# Patient Record
Sex: Female | Born: 1945 | Race: White | Hispanic: No | Marital: Married | Smoking: Former smoker
Health system: Southern US, Community
[De-identification: ages and names within clinical notes are randomized; demographics above are authoritative.]

## PROBLEM LIST (undated history)

## (undated) DIAGNOSIS — E785 Hyperlipidemia, unspecified: Secondary | ICD-10-CM

## (undated) DIAGNOSIS — M1990 Unspecified osteoarthritis, unspecified site: Secondary | ICD-10-CM

## (undated) DIAGNOSIS — I10 Essential (primary) hypertension: Secondary | ICD-10-CM

## (undated) DIAGNOSIS — F329 Major depressive disorder, single episode, unspecified: Secondary | ICD-10-CM

## (undated) DIAGNOSIS — F32A Depression, unspecified: Secondary | ICD-10-CM

## (undated) DIAGNOSIS — Z87442 Personal history of urinary calculi: Secondary | ICD-10-CM

## (undated) DIAGNOSIS — C50919 Malignant neoplasm of unspecified site of unspecified female breast: Secondary | ICD-10-CM

## (undated) HISTORY — PX: OTHER SURGICAL HISTORY: SHX169

## (undated) HISTORY — PX: ECTOPIC PREGNANCY SURGERY: SHX613

## (undated) HISTORY — PX: BREAST LUMPECTOMY: SHX2

## (undated) HISTORY — PX: INNER EAR SURGERY: SHX679

## (undated) HISTORY — PX: TONSILLECTOMY: SUR1361

## (undated) HISTORY — PX: BREAST ENHANCEMENT SURGERY: SHX7

## (undated) HISTORY — PX: TUBAL LIGATION: SHX77

## (undated) HISTORY — DX: Malignant neoplasm of unspecified site of unspecified female breast: C50.919

## (undated) HISTORY — PX: CATARACT EXTRACTION, BILATERAL: SHX1313

## (undated) HISTORY — PX: EYE SURGERY: SHX253

---

## 2002-05-09 ENCOUNTER — Encounter: Admission: RE | Admit: 2002-05-09 | Discharge: 2002-05-09 | Payer: Self-pay | Admitting: Family Medicine

## 2002-05-09 ENCOUNTER — Encounter: Payer: Self-pay | Admitting: Family Medicine

## 2002-05-14 ENCOUNTER — Other Ambulatory Visit: Admission: RE | Admit: 2002-05-14 | Discharge: 2002-05-14 | Payer: Self-pay | Admitting: Family Medicine

## 2003-06-02 ENCOUNTER — Other Ambulatory Visit: Admission: RE | Admit: 2003-06-02 | Discharge: 2003-06-02 | Payer: Self-pay | Admitting: Family Medicine

## 2004-06-27 ENCOUNTER — Encounter: Admission: RE | Admit: 2004-06-27 | Discharge: 2004-06-27 | Payer: Self-pay | Admitting: Family Medicine

## 2005-07-12 ENCOUNTER — Encounter: Admission: RE | Admit: 2005-07-12 | Discharge: 2005-07-12 | Payer: Self-pay | Admitting: Family Medicine

## 2005-07-25 ENCOUNTER — Encounter (INDEPENDENT_AMBULATORY_CARE_PROVIDER_SITE_OTHER): Payer: Self-pay | Admitting: Specialist

## 2005-07-25 ENCOUNTER — Encounter: Admission: RE | Admit: 2005-07-25 | Discharge: 2005-07-25 | Payer: Self-pay | Admitting: Family Medicine

## 2005-07-25 ENCOUNTER — Encounter (INDEPENDENT_AMBULATORY_CARE_PROVIDER_SITE_OTHER): Payer: Self-pay | Admitting: Diagnostic Radiology

## 2005-08-01 ENCOUNTER — Ambulatory Visit (HOSPITAL_COMMUNITY): Admission: RE | Admit: 2005-08-01 | Discharge: 2005-08-01 | Payer: Self-pay | Admitting: Family Medicine

## 2005-08-21 ENCOUNTER — Encounter: Admission: RE | Admit: 2005-08-21 | Discharge: 2005-08-21 | Payer: Self-pay | Admitting: General Surgery

## 2005-08-23 ENCOUNTER — Ambulatory Visit (HOSPITAL_COMMUNITY): Admission: RE | Admit: 2005-08-23 | Discharge: 2005-08-23 | Payer: Self-pay | Admitting: General Surgery

## 2005-08-23 ENCOUNTER — Encounter (INDEPENDENT_AMBULATORY_CARE_PROVIDER_SITE_OTHER): Payer: Self-pay | Admitting: *Deleted

## 2005-08-23 ENCOUNTER — Ambulatory Visit (HOSPITAL_BASED_OUTPATIENT_CLINIC_OR_DEPARTMENT_OTHER): Admission: RE | Admit: 2005-08-23 | Discharge: 2005-08-23 | Payer: Self-pay | Admitting: General Surgery

## 2005-08-23 ENCOUNTER — Encounter: Admission: RE | Admit: 2005-08-23 | Discharge: 2005-08-23 | Payer: Self-pay | Admitting: General Surgery

## 2005-08-24 ENCOUNTER — Ambulatory Visit: Payer: Self-pay | Admitting: Oncology

## 2005-09-27 ENCOUNTER — Ambulatory Visit (HOSPITAL_BASED_OUTPATIENT_CLINIC_OR_DEPARTMENT_OTHER): Admission: RE | Admit: 2005-09-27 | Discharge: 2005-09-27 | Payer: Self-pay | Admitting: General Surgery

## 2005-09-28 ENCOUNTER — Encounter: Admission: RE | Admit: 2005-09-28 | Discharge: 2005-09-28 | Payer: Self-pay | Admitting: Oncology

## 2005-09-28 ENCOUNTER — Encounter (INDEPENDENT_AMBULATORY_CARE_PROVIDER_SITE_OTHER): Payer: Self-pay | Admitting: *Deleted

## 2005-09-28 ENCOUNTER — Ambulatory Visit: Admission: RE | Admit: 2005-09-28 | Discharge: 2005-09-28 | Payer: Self-pay | Admitting: Oncology

## 2005-10-12 ENCOUNTER — Ambulatory Visit: Payer: Self-pay | Admitting: Oncology

## 2005-11-29 ENCOUNTER — Ambulatory Visit: Payer: Self-pay | Admitting: Oncology

## 2005-12-14 LAB — CBC WITH DIFFERENTIAL/PLATELET
BASO%: 1.6 % (ref 0.0–2.0)
EOS%: 2.2 % (ref 0.0–7.0)
Eosinophils Absolute: 0.1 10*3/uL (ref 0.0–0.5)
LYMPH%: 26.2 % (ref 14.0–48.0)
MCH: 31.4 pg (ref 26.0–34.0)
MCHC: 33.9 g/dL (ref 32.0–36.0)
MCV: 92.6 fL (ref 81.0–101.0)
MONO%: 20.6 % — ABNORMAL HIGH (ref 0.0–13.0)
Platelets: 315 10*3/uL (ref 145–400)
RBC: 3.5 10*6/uL — ABNORMAL LOW (ref 3.70–5.32)
RDW: 14.7 % — ABNORMAL HIGH (ref 11.3–14.5)

## 2005-12-16 ENCOUNTER — Emergency Department (HOSPITAL_COMMUNITY): Admission: EM | Admit: 2005-12-16 | Discharge: 2005-12-16 | Payer: Self-pay | Admitting: Emergency Medicine

## 2005-12-21 LAB — CBC WITH DIFFERENTIAL/PLATELET
BASO%: 1.1 % (ref 0.0–2.0)
LYMPH%: 26.5 % (ref 14.0–48.0)
MCHC: 35.2 g/dL (ref 32.0–36.0)
MONO#: 0.6 10*3/uL (ref 0.1–0.9)
NEUT#: 2.1 10*3/uL (ref 1.5–6.5)
Platelets: 377 10*3/uL (ref 145–400)
RBC: 3.42 10*6/uL — ABNORMAL LOW (ref 3.70–5.32)
RDW: 14.2 % (ref 11.3–14.5)
WBC: 3.8 10*3/uL — ABNORMAL LOW (ref 3.9–10.0)

## 2005-12-28 LAB — CBC WITH DIFFERENTIAL/PLATELET
Basophils Absolute: 0.1 10*3/uL (ref 0.0–0.1)
EOS%: 4.2 % (ref 0.0–7.0)
Eosinophils Absolute: 0.2 10*3/uL (ref 0.0–0.5)
HGB: 11.1 g/dL — ABNORMAL LOW (ref 11.6–15.9)
LYMPH%: 27.9 % (ref 14.0–48.0)
MCH: 33.2 pg (ref 26.0–34.0)
MCV: 92.1 fL (ref 81.0–101.0)
MONO%: 10.6 % (ref 0.0–13.0)
NEUT#: 2.8 10*3/uL (ref 1.5–6.5)
NEUT%: 56.2 % (ref 39.6–76.8)
Platelets: 345 10*3/uL (ref 145–400)
RDW: 14 % (ref 11.3–14.5)

## 2006-01-04 LAB — URINALYSIS, MICROSCOPIC - CHCC

## 2006-01-04 LAB — CBC WITH DIFFERENTIAL/PLATELET
BASO%: 1 % (ref 0.0–2.0)
Eosinophils Absolute: 0.1 10*3/uL (ref 0.0–0.5)
HCT: 32.3 % — ABNORMAL LOW (ref 34.8–46.6)
MCHC: 34.5 g/dL (ref 32.0–36.0)
MONO#: 0.3 10*3/uL (ref 0.1–0.9)
NEUT#: 2.4 10*3/uL (ref 1.5–6.5)
NEUT%: 59.6 % (ref 39.6–76.8)
RBC: 3.36 10*6/uL — ABNORMAL LOW (ref 3.70–5.32)
WBC: 4 10*3/uL (ref 3.9–10.0)
lymph#: 1.1 10*3/uL (ref 0.9–3.3)

## 2006-01-07 LAB — URINE CULTURE

## 2006-01-11 LAB — URINALYSIS, MICROSCOPIC - CHCC
Bacteria, UA: NEGATIVE
Bilirubin (Urine): NEGATIVE
Glucose: NEGATIVE g/dL
Ketones: NEGATIVE mg/dL
RBC count: NEGATIVE (ref 0–2)
Specific Gravity, Urine: 1.025 (ref 1.003–1.035)

## 2006-01-11 LAB — CBC WITH DIFFERENTIAL/PLATELET
BASO%: 1.1 % (ref 0.0–2.0)
EOS%: 5.3 % (ref 0.0–7.0)
HCT: 33.1 % — ABNORMAL LOW (ref 34.8–46.6)
MCHC: 34.9 g/dL (ref 32.0–36.0)
MONO#: 0.3 10*3/uL (ref 0.1–0.9)
NEUT%: 55 % (ref 39.6–76.8)
RBC: 3.42 10*6/uL — ABNORMAL LOW (ref 3.70–5.32)
RDW: 13.9 % (ref 11.3–14.5)
WBC: 4.2 10*3/uL (ref 3.9–10.0)
lymph#: 1.3 10*3/uL (ref 0.9–3.3)

## 2006-01-13 LAB — URINE CULTURE

## 2006-01-17 ENCOUNTER — Ambulatory Visit: Payer: Self-pay | Admitting: Oncology

## 2006-01-18 LAB — URINALYSIS, MICROSCOPIC - CHCC
Glucose: NEGATIVE g/dL
Ketones: NEGATIVE mg/dL
Protein: NEGATIVE mg/dL
Specific Gravity, Urine: 1.01 (ref 1.003–1.035)

## 2006-01-18 LAB — CBC WITH DIFFERENTIAL/PLATELET
BASO%: 1.2 % (ref 0.0–2.0)
HCT: 32.7 % — ABNORMAL LOW (ref 34.8–46.6)
MCHC: 34.9 g/dL (ref 32.0–36.0)
MONO#: 0.4 10*3/uL (ref 0.1–0.9)
NEUT%: 47.4 % (ref 39.6–76.8)
WBC: 3 10*3/uL — ABNORMAL LOW (ref 3.9–10.0)
lymph#: 1.1 10*3/uL (ref 0.9–3.3)

## 2006-01-25 LAB — CBC WITH DIFFERENTIAL/PLATELET
BASO%: 2 % (ref 0.0–2.0)
MCHC: 35.2 g/dL (ref 32.0–36.0)
MONO#: 0.4 10*3/uL (ref 0.1–0.9)
RBC: 3.22 10*6/uL — ABNORMAL LOW (ref 3.70–5.32)
WBC: 4.5 10*3/uL (ref 3.9–10.0)
lymph#: 1.1 10*3/uL (ref 0.9–3.3)

## 2006-02-01 LAB — CBC WITH DIFFERENTIAL/PLATELET
BASO%: 1.2 % (ref 0.0–2.0)
Basophils Absolute: 0.1 10*3/uL (ref 0.0–0.1)
HCT: 36.7 % (ref 34.8–46.6)
HGB: 12.9 g/dL (ref 11.6–15.9)
MCHC: 35.1 g/dL (ref 32.0–36.0)
MONO#: 0.5 10*3/uL (ref 0.1–0.9)
NEUT%: 63.7 % (ref 39.6–76.8)
RDW: 13.6 % (ref 11.3–14.5)
WBC: 5 10*3/uL (ref 3.9–10.0)
lymph#: 1.2 10*3/uL (ref 0.9–3.3)

## 2006-02-07 LAB — CBC WITH DIFFERENTIAL/PLATELET
Basophils Absolute: 0 10*3/uL (ref 0.0–0.1)
EOS%: 2.3 % (ref 0.0–7.0)
Eosinophils Absolute: 0.1 10*3/uL (ref 0.0–0.5)
HGB: 13 g/dL (ref 11.6–15.9)
LYMPH%: 30.9 % (ref 14.0–48.0)
MCH: 33.4 pg (ref 26.0–34.0)
MCV: 98.8 fL (ref 81.0–101.0)
MONO%: 22 % — ABNORMAL HIGH (ref 0.0–13.0)
NEUT#: 1.7 10*3/uL (ref 1.5–6.5)
Platelets: 308 10*3/uL (ref 145–400)

## 2006-02-09 ENCOUNTER — Ambulatory Visit: Admission: RE | Admit: 2006-02-09 | Discharge: 2006-05-10 | Payer: Self-pay | Admitting: *Deleted

## 2006-02-14 LAB — CBC WITH DIFFERENTIAL/PLATELET
BASO%: 0.2 % (ref 0.0–2.0)
Eosinophils Absolute: 0.1 10*3/uL (ref 0.0–0.5)
LYMPH%: 23 % (ref 14.0–48.0)
MCHC: 34.1 g/dL (ref 32.0–36.0)
MCV: 97.1 fL (ref 81.0–101.0)
MONO%: 8.6 % (ref 0.0–13.0)
Platelets: 304 10*3/uL (ref 145–400)
RBC: 3.86 10*6/uL (ref 3.70–5.32)

## 2006-02-21 LAB — CBC WITH DIFFERENTIAL/PLATELET
BASO%: 0.3 % (ref 0.0–2.0)
EOS%: 2.5 % (ref 0.0–7.0)
LYMPH%: 32.7 % (ref 14.0–48.0)
MCH: 33.1 pg (ref 26.0–34.0)
MCHC: 34.2 g/dL (ref 32.0–36.0)
MONO#: 0.2 10*3/uL (ref 0.1–0.9)
Platelets: 228 10*3/uL (ref 145–400)
RBC: 4.04 10*6/uL (ref 3.70–5.32)
WBC: 3 10*3/uL — ABNORMAL LOW (ref 3.9–10.0)

## 2006-02-22 ENCOUNTER — Ambulatory Visit (HOSPITAL_COMMUNITY): Admission: RE | Admit: 2006-02-22 | Discharge: 2006-02-22 | Payer: Self-pay | Admitting: Oncology

## 2006-02-28 ENCOUNTER — Ambulatory Visit (HOSPITAL_COMMUNITY): Admission: RE | Admit: 2006-02-28 | Discharge: 2006-02-28 | Payer: Self-pay | Admitting: Oncology

## 2006-03-07 ENCOUNTER — Ambulatory Visit: Payer: Self-pay | Admitting: Oncology

## 2006-03-13 LAB — CBC WITH DIFFERENTIAL/PLATELET
BASO%: 0.2 % (ref 0.0–2.0)
EOS%: 2.3 % (ref 0.0–7.0)
MCH: 32.7 pg (ref 26.0–34.0)
MCHC: 34.7 g/dL (ref 32.0–36.0)
RBC: 3.84 10*6/uL (ref 3.70–5.32)
RDW: 13.5 % (ref 11.3–14.5)
lymph#: 1.3 10*3/uL (ref 0.9–3.3)

## 2006-03-26 ENCOUNTER — Ambulatory Visit (HOSPITAL_BASED_OUTPATIENT_CLINIC_OR_DEPARTMENT_OTHER): Admission: RE | Admit: 2006-03-26 | Discharge: 2006-03-26 | Payer: Self-pay | Admitting: General Surgery

## 2006-03-26 LAB — COMPREHENSIVE METABOLIC PANEL
ALT: 24 U/L (ref 0–40)
AST: 20 U/L (ref 0–37)
Calcium: 8.8 mg/dL (ref 8.4–10.5)
Chloride: 104 mEq/L (ref 96–112)
Creatinine, Ser: 0.78 mg/dL (ref 0.40–1.20)
Potassium: 4.3 mEq/L (ref 3.5–5.3)

## 2006-04-16 LAB — URINALYSIS, MICROSCOPIC - CHCC: Glucose: NEGATIVE g/dL

## 2006-04-19 LAB — CBC WITH DIFFERENTIAL/PLATELET
BASO%: 0.8 % (ref 0.0–2.0)
EOS%: 3.1 % (ref 0.0–7.0)
Eosinophils Absolute: 0.1 10*3/uL (ref 0.0–0.5)
LYMPH%: 31.8 % (ref 14.0–48.0)
MCH: 31.4 pg (ref 26.0–34.0)
MCHC: 34.1 g/dL (ref 32.0–36.0)
MCV: 92.1 fL (ref 81.0–101.0)
MONO%: 14.7 % — ABNORMAL HIGH (ref 0.0–13.0)
Platelets: 231 10*3/uL (ref 145–400)
RBC: 3.98 10*6/uL (ref 3.70–5.32)
RDW: 11.7 % (ref 11.3–14.5)

## 2006-04-25 ENCOUNTER — Ambulatory Visit: Payer: Self-pay | Admitting: Oncology

## 2006-04-27 LAB — CBC WITH DIFFERENTIAL/PLATELET
Basophils Absolute: 0 10*3/uL (ref 0.0–0.1)
HCT: 37.3 % (ref 34.8–46.6)
HGB: 12.8 g/dL (ref 11.6–15.9)
LYMPH%: 25.4 % (ref 14.0–48.0)
MCH: 31.8 pg (ref 26.0–34.0)
MONO#: 0.4 10*3/uL (ref 0.1–0.9)
NEUT%: 60.5 % (ref 39.6–76.8)
Platelets: 246 10*3/uL (ref 145–400)
WBC: 3.7 10*3/uL — ABNORMAL LOW (ref 3.9–10.0)
lymph#: 0.9 10*3/uL (ref 0.9–3.3)

## 2006-05-05 LAB — CANCER ANTIGEN 27.29: CA 27.29: 15 U/mL (ref 0–39)

## 2006-05-05 LAB — ESTRADIOL, ULTRA SENS: Estradiol, Ultra Sensitive: 25 pg/mL

## 2006-05-05 LAB — COMPREHENSIVE METABOLIC PANEL
BUN: 16 mg/dL (ref 6–23)
CO2: 29 mEq/L (ref 19–32)
Calcium: 9.5 mg/dL (ref 8.4–10.5)
Chloride: 101 mEq/L (ref 96–112)
Creatinine, Ser: 0.84 mg/dL (ref 0.40–1.20)
Glucose, Bld: 100 mg/dL — ABNORMAL HIGH (ref 70–99)
Total Bilirubin: 0.8 mg/dL (ref 0.3–1.2)

## 2006-05-05 LAB — FOLLICLE STIMULATING HORMONE: FSH: 65.1 m[IU]/mL

## 2006-05-11 ENCOUNTER — Encounter: Admission: RE | Admit: 2006-05-11 | Discharge: 2006-05-11 | Payer: Self-pay | Admitting: Oncology

## 2006-06-01 LAB — COMPREHENSIVE METABOLIC PANEL
ALT: 22 U/L (ref 0–40)
CO2: 27 mEq/L (ref 19–32)
Calcium: 8.8 mg/dL (ref 8.4–10.5)
Chloride: 105 mEq/L (ref 96–112)
Sodium: 138 mEq/L (ref 135–145)
Total Protein: 6.9 g/dL (ref 6.0–8.3)

## 2006-06-01 LAB — CBC WITH DIFFERENTIAL/PLATELET
BASO%: 1.8 % (ref 0.0–2.0)
LYMPH%: 29.9 % (ref 14.0–48.0)
MCHC: 34.9 g/dL (ref 32.0–36.0)
MONO#: 0.8 10*3/uL (ref 0.1–0.9)
Platelets: 231 10*3/uL (ref 145–400)
RBC: 3.81 10*6/uL (ref 3.70–5.32)
RDW: 12.9 % (ref 11.3–14.5)
WBC: 5.3 10*3/uL (ref 3.9–10.0)
lymph#: 1.6 10*3/uL (ref 0.9–3.3)

## 2006-06-01 LAB — LACTATE DEHYDROGENASE: LDH: 167 U/L (ref 94–250)

## 2006-07-11 ENCOUNTER — Ambulatory Visit: Payer: Self-pay | Admitting: Oncology

## 2006-07-13 LAB — CBC WITH DIFFERENTIAL/PLATELET
BASO%: 0.3 % (ref 0.0–2.0)
Eosinophils Absolute: 0.1 10*3/uL (ref 0.0–0.5)
LYMPH%: 29 % (ref 14.0–48.0)
MCHC: 34.4 g/dL (ref 32.0–36.0)
MCV: 93.1 fL (ref 81.0–101.0)
MONO%: 8.9 % (ref 0.0–13.0)
NEUT#: 4.2 10*3/uL (ref 1.5–6.5)
Platelets: 252 10*3/uL (ref 145–400)
RBC: 4.01 10*6/uL (ref 3.70–5.32)
RDW: 12.5 % (ref 11.3–14.5)
WBC: 7.1 10*3/uL (ref 3.9–10.0)

## 2006-07-16 ENCOUNTER — Encounter: Admission: RE | Admit: 2006-07-16 | Discharge: 2006-07-16 | Payer: Self-pay | Admitting: Oncology

## 2006-10-01 ENCOUNTER — Ambulatory Visit: Payer: Self-pay | Admitting: Oncology

## 2006-10-04 LAB — CBC WITH DIFFERENTIAL/PLATELET
EOS%: 2.4 % (ref 0.0–7.0)
LYMPH%: 28.9 % (ref 14.0–48.0)
MCH: 32 pg (ref 26.0–34.0)
MCHC: 34.5 g/dL (ref 32.0–36.0)
MCV: 92.9 fL (ref 81.0–101.0)
MONO%: 10.9 % (ref 0.0–13.0)
NEUT#: 2.7 10*3/uL (ref 1.5–6.5)
Platelets: 245 10*3/uL (ref 145–400)
RBC: 4.3 10*6/uL (ref 3.70–5.32)
RDW: 12.8 % (ref 11.3–14.5)

## 2006-10-04 LAB — CANCER ANTIGEN 27.29: CA 27.29: 14 U/mL (ref 0–39)

## 2006-10-04 LAB — COMPREHENSIVE METABOLIC PANEL
ALT: 23 U/L (ref 0–35)
Alkaline Phosphatase: 61 U/L (ref 39–117)
CO2: 29 mEq/L (ref 19–32)
Creatinine, Ser: 0.86 mg/dL (ref 0.40–1.20)
Sodium: 143 mEq/L (ref 135–145)
Total Bilirubin: 0.8 mg/dL (ref 0.3–1.2)
Total Protein: 7.1 g/dL (ref 6.0–8.3)

## 2006-12-17 ENCOUNTER — Encounter: Admission: RE | Admit: 2006-12-17 | Discharge: 2006-12-17 | Payer: Self-pay | Admitting: Family Medicine

## 2007-04-02 ENCOUNTER — Ambulatory Visit: Payer: Self-pay | Admitting: Oncology

## 2007-04-04 LAB — COMPREHENSIVE METABOLIC PANEL
ALT: 19 U/L (ref 0–35)
AST: 23 U/L (ref 0–37)
Alkaline Phosphatase: 64 U/L (ref 39–117)
Sodium: 142 mEq/L (ref 135–145)
Total Bilirubin: 0.8 mg/dL (ref 0.3–1.2)
Total Protein: 7 g/dL (ref 6.0–8.3)

## 2007-04-04 LAB — CBC WITH DIFFERENTIAL/PLATELET
BASO%: 0.3 % (ref 0.0–2.0)
Basophils Absolute: 0 10*3/uL (ref 0.0–0.1)
EOS%: 2.2 % (ref 0.0–7.0)
Eosinophils Absolute: 0.1 10*3/uL (ref 0.0–0.5)
HCT: 37.5 % (ref 34.8–46.6)
HGB: 13.1 g/dL (ref 11.6–15.9)
LYMPH%: 23.7 % (ref 14.0–48.0)
MCH: 31.9 pg (ref 26.0–34.0)
MCHC: 34.9 g/dL (ref 32.0–36.0)
MCV: 91.2 fL (ref 81.0–101.0)
MONO#: 0.5 10*3/uL (ref 0.1–0.9)
MONO%: 10 % (ref 0.0–13.0)
NEUT#: 3.5 10*3/uL (ref 1.5–6.5)
NEUT%: 63.8 % (ref 39.6–76.8)
Platelets: 237 10*3/uL (ref 145–400)
RBC: 4.11 10*6/uL (ref 3.70–5.32)
RDW: 13 % (ref 11.3–14.5)
WBC: 5.4 10*3/uL (ref 3.9–10.0)
lymph#: 1.3 10*3/uL (ref 0.9–3.3)

## 2007-07-22 ENCOUNTER — Encounter: Admission: RE | Admit: 2007-07-22 | Discharge: 2007-07-22 | Payer: Self-pay | Admitting: Radiation Oncology

## 2007-08-01 ENCOUNTER — Encounter: Admission: RE | Admit: 2007-08-01 | Discharge: 2007-08-01 | Payer: Self-pay | Admitting: Radiation Oncology

## 2007-10-01 ENCOUNTER — Ambulatory Visit: Payer: Self-pay | Admitting: Oncology

## 2007-10-03 LAB — CBC WITH DIFFERENTIAL/PLATELET
Basophils Absolute: 0 10*3/uL (ref 0.0–0.1)
EOS%: 2 % (ref 0.0–7.0)
HCT: 35.2 % (ref 34.8–46.6)
HGB: 12.2 g/dL (ref 11.6–15.9)
MCH: 31.2 pg (ref 26.0–34.0)
MCV: 90.1 fL (ref 81.0–101.0)
MONO%: 7.8 % (ref 0.0–13.0)
NEUT%: 67.5 % (ref 39.6–76.8)
Platelets: 272 10*3/uL (ref 145–400)
lymph#: 1.6 10*3/uL (ref 0.9–3.3)

## 2007-10-03 LAB — COMPREHENSIVE METABOLIC PANEL
AST: 15 U/L (ref 0–37)
BUN: 20 mg/dL (ref 6–23)
Calcium: 8.6 mg/dL (ref 8.4–10.5)
Chloride: 106 mEq/L (ref 96–112)
Creatinine, Ser: 0.73 mg/dL (ref 0.40–1.20)

## 2007-10-09 LAB — VITAMIN D PNL(25-HYDRXY+1,25-DIHY)-BLD: Vit D, 1,25-Dihydroxy: 29 pg/mL (ref 6–62)

## 2007-10-16 LAB — VITAMIN D PNL(25-HYDRXY+1,25-DIHY)-BLD: Vit D, 25-Hydroxy: 53 ng/mL (ref 30–89)

## 2008-03-26 ENCOUNTER — Ambulatory Visit: Payer: Self-pay | Admitting: Oncology

## 2008-03-31 LAB — CBC WITH DIFFERENTIAL/PLATELET
BASO%: 0.6 % (ref 0.0–2.0)
EOS%: 2.2 % (ref 0.0–7.0)
MCH: 31.3 pg (ref 26.0–34.0)
MCHC: 34.8 g/dL (ref 32.0–36.0)
MONO%: 9.9 % (ref 0.0–13.0)
NEUT%: 57.3 % (ref 39.6–76.8)
RDW: 13.2 % (ref 11.3–14.5)
lymph#: 2.3 10*3/uL (ref 0.9–3.3)

## 2008-04-01 LAB — COMPREHENSIVE METABOLIC PANEL
ALT: 20 U/L (ref 0–35)
AST: 19 U/L (ref 0–37)
Alkaline Phosphatase: 56 U/L (ref 39–117)
Calcium: 9.3 mg/dL (ref 8.4–10.5)
Chloride: 103 mEq/L (ref 96–112)
Creatinine, Ser: 0.73 mg/dL (ref 0.40–1.20)
Potassium: 4.3 mEq/L (ref 3.5–5.3)

## 2008-07-28 ENCOUNTER — Encounter: Admission: RE | Admit: 2008-07-28 | Discharge: 2008-07-28 | Payer: Self-pay | Admitting: Family Medicine

## 2008-11-20 ENCOUNTER — Ambulatory Visit: Payer: Self-pay | Admitting: Oncology

## 2008-12-02 LAB — CBC WITH DIFFERENTIAL/PLATELET
BASO%: 0.4 % (ref 0.0–2.0)
EOS%: 1.4 % (ref 0.0–7.0)
HCT: 38.1 % (ref 34.8–46.6)
HGB: 13 g/dL (ref 11.6–15.9)
MCHC: 34.1 g/dL (ref 31.5–36.0)
MONO#: 0.6 10*3/uL (ref 0.1–0.9)
MONO%: 8.9 % (ref 0.0–14.0)
RBC: 4.14 10*6/uL (ref 3.70–5.45)
RDW: 12.8 % (ref 11.2–14.5)
WBC: 7.1 10*3/uL (ref 3.9–10.3)

## 2008-12-03 LAB — CANCER ANTIGEN 27.29: CA 27.29: 14 U/mL (ref 0–39)

## 2008-12-03 LAB — COMPREHENSIVE METABOLIC PANEL
Albumin: 4 g/dL (ref 3.5–5.2)
CO2: 24 mEq/L (ref 19–32)
Chloride: 106 mEq/L (ref 96–112)
Glucose, Bld: 106 mg/dL — ABNORMAL HIGH (ref 70–99)
Potassium: 4.2 mEq/L (ref 3.5–5.3)
Sodium: 139 mEq/L (ref 135–145)
Total Protein: 6.7 g/dL (ref 6.0–8.3)

## 2008-12-03 LAB — VITAMIN D 25 HYDROXY (VIT D DEFICIENCY, FRACTURES): Vit D, 25-Hydroxy: 66 ng/mL (ref 30–89)

## 2008-12-17 ENCOUNTER — Encounter: Admission: RE | Admit: 2008-12-17 | Discharge: 2008-12-17 | Payer: Self-pay | Admitting: Oncology

## 2009-04-29 ENCOUNTER — Ambulatory Visit: Payer: Self-pay | Admitting: Oncology

## 2009-05-03 LAB — COMPREHENSIVE METABOLIC PANEL
AST: 19 U/L (ref 0–37)
Albumin: 4.1 g/dL (ref 3.5–5.2)
Alkaline Phosphatase: 36 U/L — ABNORMAL LOW (ref 39–117)
Chloride: 104 mEq/L (ref 96–112)
Glucose, Bld: 94 mg/dL (ref 70–99)
Total Bilirubin: 0.5 mg/dL (ref 0.3–1.2)
Total Protein: 6.8 g/dL (ref 6.0–8.3)

## 2009-05-03 LAB — CBC WITH DIFFERENTIAL/PLATELET
BASO%: 0.3 % (ref 0.0–2.0)
Basophils Absolute: 0 10*3/uL (ref 0.0–0.1)
MCH: 31.6 pg (ref 25.1–34.0)
MCHC: 33.9 g/dL (ref 31.5–36.0)
MONO#: 0.6 10*3/uL (ref 0.1–0.9)
RDW: 13.1 % (ref 11.2–14.5)

## 2009-05-03 LAB — RESEARCH LABS

## 2009-07-29 ENCOUNTER — Encounter: Admission: RE | Admit: 2009-07-29 | Discharge: 2009-07-29 | Payer: Self-pay | Admitting: Family Medicine

## 2010-05-03 ENCOUNTER — Ambulatory Visit: Payer: Self-pay | Admitting: Oncology

## 2010-05-03 LAB — CBC WITH DIFFERENTIAL/PLATELET
BASO%: 0.2 % (ref 0.0–2.0)
Eosinophils Absolute: 0.1 10*3/uL (ref 0.0–0.5)
HCT: 37.1 % (ref 34.8–46.6)
HGB: 12.9 g/dL (ref 11.6–15.9)
LYMPH%: 27.5 % (ref 14.0–49.7)
MCHC: 34.8 g/dL (ref 31.5–36.0)
MONO#: 0.5 10*3/uL (ref 0.1–0.9)
NEUT#: 4 10*3/uL (ref 1.5–6.5)
Platelets: 208 10*3/uL (ref 145–400)
RDW: 13.6 % (ref 11.2–14.5)
WBC: 6.4 10*3/uL (ref 3.9–10.3)
lymph#: 1.8 10*3/uL (ref 0.9–3.3)

## 2010-05-04 LAB — COMPREHENSIVE METABOLIC PANEL
Alkaline Phosphatase: 35 U/L — ABNORMAL LOW (ref 39–117)
BUN: 18 mg/dL (ref 6–23)
CO2: 26 mEq/L (ref 19–32)
Chloride: 103 mEq/L (ref 96–112)
Creatinine, Ser: 0.74 mg/dL (ref 0.40–1.20)
Potassium: 3.8 mEq/L (ref 3.5–5.3)
Total Bilirubin: 0.6 mg/dL (ref 0.3–1.2)
Total Protein: 6.9 g/dL (ref 6.0–8.3)

## 2010-05-04 LAB — VITAMIN D 25 HYDROXY (VIT D DEFICIENCY, FRACTURES): Vit D, 25-Hydroxy: 66 ng/mL (ref 30–89)

## 2010-05-10 LAB — URINALYSIS, MICROSCOPIC - CHCC
Blood: NEGATIVE
Ketones: NEGATIVE mg/dL
Protein: NEGATIVE mg/dL

## 2010-05-11 LAB — URINE CULTURE

## 2010-08-10 ENCOUNTER — Encounter: Admission: RE | Admit: 2010-08-10 | Discharge: 2010-08-10 | Payer: Self-pay | Admitting: Family Medicine

## 2011-01-27 NOTE — Op Note (Signed)
Brooke Cantrell, Brooke Cantrell                 ACCOUNT NO.:  [id number]   MEDICAL RECORD NO.:  [id number]          PATIENT TYPE:  AMB   LOCATION:  DSC                          FACILITY:  [facility]   PHYSICIAN:  Rose Phi. Maple Hudson, M.D.   DATE OF BIRTH:  1945-11-07   DATE OF PROCEDURE:  03/26/2006  DATE OF DISCHARGE:                                 OPERATIVE REPORT   PREOPERATIVE DIAGNOSIS:  History of left breast cancer.   POSTOPERATIVE DIAGNOSIS:  History of left breast cancer.   OPERATION:  Removal of Port-A-Cath.   SURGEON:  Rose Phi. Maple Hudson, M.D.   ANESTHESIA:  Local.   OPERATIVE PROCEDURE:  The patient placed on the operating table and the  right upper chest at the area of the port was prepped and draped in the  usual fashion.  We used 1% Xylocaine with adrenaline as a local anesthetic.  After anesthetizing the area, a short transverse incision was made with  dissection down to the port.  The catheter was identified, grasped and  removed from the vein.  With traction on the catheter.  The sutures holding  the port in place were identified and divided; and the port slipped out.  There was no bleeding.  A subcuticular closure of 4-0 Monocryl and Steri-  Strips carried out.  Dressing applied.  The patient then allowed to go home.      Rose Phi. Maple Hudson, M.D.  Electronically Signed     PRY/MEDQ  D:  03/26/2006  T:  03/26/2006  Job:  [id number]

## 2011-05-05 ENCOUNTER — Encounter (HOSPITAL_BASED_OUTPATIENT_CLINIC_OR_DEPARTMENT_OTHER): Payer: Commercial Managed Care - PPO | Admitting: Oncology

## 2011-05-05 ENCOUNTER — Other Ambulatory Visit: Payer: Self-pay | Admitting: Oncology

## 2011-05-05 DIAGNOSIS — C50919 Malignant neoplasm of unspecified site of unspecified female breast: Secondary | ICD-10-CM

## 2011-05-05 DIAGNOSIS — M899 Disorder of bone, unspecified: Secondary | ICD-10-CM

## 2011-05-05 DIAGNOSIS — M949 Disorder of cartilage, unspecified: Secondary | ICD-10-CM

## 2011-05-05 DIAGNOSIS — Z170 Estrogen receptor positive status [ER+]: Secondary | ICD-10-CM

## 2011-05-05 LAB — URINALYSIS, MICROSCOPIC - CHCC
Ketones: NEGATIVE mg/dL
Leukocyte Esterase: NEGATIVE
Nitrite: NEGATIVE
Specific Gravity, Urine: 1.02 (ref 1.003–1.035)
pH: 6 (ref 4.6–8.0)

## 2011-05-05 LAB — CBC WITH DIFFERENTIAL/PLATELET
Basophils Absolute: 0 10*3/uL (ref 0.0–0.1)
HGB: 12.4 g/dL (ref 11.6–15.9)
LYMPH%: 21.6 % (ref 14.0–49.7)
MONO#: 0.5 10*3/uL (ref 0.1–0.9)
Platelets: 209 10*3/uL (ref 145–400)
RBC: 3.88 10*6/uL (ref 3.70–5.45)

## 2011-05-06 LAB — COMPREHENSIVE METABOLIC PANEL
BUN: 17 mg/dL (ref 6–23)
Calcium: 9.5 mg/dL (ref 8.4–10.5)
Glucose, Bld: 113 mg/dL — ABNORMAL HIGH (ref 70–99)
Potassium: 4.1 mEq/L (ref 3.5–5.3)

## 2011-05-06 LAB — VITAMIN D 25 HYDROXY (VIT D DEFICIENCY, FRACTURES): Vit D, 25-Hydroxy: 61 ng/mL (ref 30–89)

## 2011-05-06 LAB — CANCER ANTIGEN 27.29: CA 27.29: 19 U/mL (ref 0–39)

## 2011-05-17 ENCOUNTER — Encounter (HOSPITAL_BASED_OUTPATIENT_CLINIC_OR_DEPARTMENT_OTHER): Payer: Commercial Managed Care - PPO | Admitting: Oncology

## 2011-05-17 DIAGNOSIS — C50919 Malignant neoplasm of unspecified site of unspecified female breast: Secondary | ICD-10-CM

## 2011-05-17 DIAGNOSIS — Z170 Estrogen receptor positive status [ER+]: Secondary | ICD-10-CM

## 2011-07-12 ENCOUNTER — Other Ambulatory Visit: Payer: Self-pay | Admitting: Family Medicine

## 2011-07-12 DIAGNOSIS — Z853 Personal history of malignant neoplasm of breast: Secondary | ICD-10-CM

## 2011-08-08 ENCOUNTER — Other Ambulatory Visit: Payer: Self-pay | Admitting: Oncology

## 2011-08-14 ENCOUNTER — Ambulatory Visit
Admission: RE | Admit: 2011-08-14 | Discharge: 2011-08-14 | Disposition: A | Discharge status: Home / Self Care | Payer: Commercial Managed Care - PPO | Source: Ambulatory Visit | Attending: Family Medicine | Admitting: Family Medicine

## 2011-08-14 DIAGNOSIS — Z853 Personal history of malignant neoplasm of breast: Secondary | ICD-10-CM

## 2011-08-31 ENCOUNTER — Other Ambulatory Visit: Payer: Self-pay | Admitting: Family Medicine

## 2011-08-31 DIAGNOSIS — M8580 Other specified disorders of bone density and structure, unspecified site: Secondary | ICD-10-CM

## 2011-09-13 ENCOUNTER — Ambulatory Visit
Admission: RE | Admit: 2011-09-13 | Discharge: 2011-09-13 | Disposition: A | Discharge status: Home / Self Care | Payer: Commercial Managed Care - PPO | Source: Ambulatory Visit | Attending: Family Medicine | Admitting: Family Medicine

## 2011-09-13 DIAGNOSIS — M8580 Other specified disorders of bone density and structure, unspecified site: Secondary | ICD-10-CM

## 2012-02-20 DIAGNOSIS — L819 Disorder of pigmentation, unspecified: Secondary | ICD-10-CM | POA: Diagnosis not present

## 2012-02-20 DIAGNOSIS — C44519 Basal cell carcinoma of skin of other part of trunk: Secondary | ICD-10-CM | POA: Diagnosis not present

## 2012-02-20 DIAGNOSIS — L821 Other seborrheic keratosis: Secondary | ICD-10-CM | POA: Diagnosis not present

## 2012-02-20 DIAGNOSIS — D1801 Hemangioma of skin and subcutaneous tissue: Secondary | ICD-10-CM | POA: Diagnosis not present

## 2012-02-20 DIAGNOSIS — D485 Neoplasm of uncertain behavior of skin: Secondary | ICD-10-CM | POA: Diagnosis not present

## 2012-02-20 DIAGNOSIS — D239 Other benign neoplasm of skin, unspecified: Secondary | ICD-10-CM | POA: Diagnosis not present

## 2012-02-20 DIAGNOSIS — L820 Inflamed seborrheic keratosis: Secondary | ICD-10-CM | POA: Diagnosis not present

## 2012-02-20 DIAGNOSIS — D2370 Other benign neoplasm of skin of unspecified lower limb, including hip: Secondary | ICD-10-CM | POA: Diagnosis not present

## 2012-03-02 DIAGNOSIS — J020 Streptococcal pharyngitis: Secondary | ICD-10-CM | POA: Diagnosis not present

## 2012-03-02 DIAGNOSIS — R509 Fever, unspecified: Secondary | ICD-10-CM | POA: Diagnosis not present

## 2012-03-02 DIAGNOSIS — D7289 Other specified disorders of white blood cells: Secondary | ICD-10-CM | POA: Diagnosis not present

## 2012-03-02 DIAGNOSIS — D72829 Elevated white blood cell count, unspecified: Secondary | ICD-10-CM | POA: Diagnosis not present

## 2012-03-02 DIAGNOSIS — R51 Headache: Secondary | ICD-10-CM | POA: Diagnosis not present

## 2012-03-02 DIAGNOSIS — R1310 Dysphagia, unspecified: Secondary | ICD-10-CM | POA: Diagnosis not present

## 2012-03-02 DIAGNOSIS — J029 Acute pharyngitis, unspecified: Secondary | ICD-10-CM | POA: Diagnosis not present

## 2012-03-06 DIAGNOSIS — C44519 Basal cell carcinoma of skin of other part of trunk: Secondary | ICD-10-CM | POA: Diagnosis not present

## 2012-03-06 DIAGNOSIS — L719 Rosacea, unspecified: Secondary | ICD-10-CM | POA: Diagnosis not present

## 2012-04-10 DIAGNOSIS — M7989 Other specified soft tissue disorders: Secondary | ICD-10-CM | POA: Diagnosis not present

## 2012-04-11 DIAGNOSIS — M949 Disorder of cartilage, unspecified: Secondary | ICD-10-CM | POA: Diagnosis not present

## 2012-04-11 DIAGNOSIS — M899 Disorder of bone, unspecified: Secondary | ICD-10-CM | POA: Diagnosis not present

## 2012-04-11 DIAGNOSIS — M25549 Pain in joints of unspecified hand: Secondary | ICD-10-CM | POA: Diagnosis not present

## 2012-04-11 DIAGNOSIS — M7989 Other specified soft tissue disorders: Secondary | ICD-10-CM | POA: Diagnosis not present

## 2012-06-14 ENCOUNTER — Other Ambulatory Visit: Payer: Self-pay | Admitting: Family Medicine

## 2012-06-14 DIAGNOSIS — J029 Acute pharyngitis, unspecified: Secondary | ICD-10-CM | POA: Diagnosis not present

## 2012-06-14 DIAGNOSIS — Z853 Personal history of malignant neoplasm of breast: Secondary | ICD-10-CM

## 2012-06-14 DIAGNOSIS — Z9889 Other specified postprocedural states: Secondary | ICD-10-CM

## 2012-07-18 DIAGNOSIS — C44320 Squamous cell carcinoma of skin of unspecified parts of face: Secondary | ICD-10-CM | POA: Diagnosis not present

## 2012-07-18 DIAGNOSIS — D485 Neoplasm of uncertain behavior of skin: Secondary | ICD-10-CM | POA: Diagnosis not present

## 2012-08-15 ENCOUNTER — Ambulatory Visit
Admission: RE | Admit: 2012-08-15 | Discharge: 2012-08-15 | Disposition: A | Discharge status: Home / Self Care | Payer: Medicare Other | Source: Ambulatory Visit | Attending: Family Medicine | Admitting: Family Medicine

## 2012-08-15 DIAGNOSIS — Z853 Personal history of malignant neoplasm of breast: Secondary | ICD-10-CM

## 2012-08-15 DIAGNOSIS — Z9889 Other specified postprocedural states: Secondary | ICD-10-CM

## 2012-08-27 DIAGNOSIS — Z79899 Other long term (current) drug therapy: Secondary | ICD-10-CM | POA: Diagnosis not present

## 2012-08-27 DIAGNOSIS — Z23 Encounter for immunization: Secondary | ICD-10-CM | POA: Diagnosis not present

## 2012-08-27 DIAGNOSIS — M949 Disorder of cartilage, unspecified: Secondary | ICD-10-CM | POA: Diagnosis not present

## 2012-08-27 DIAGNOSIS — Z0000 Encounter for general adult medical examination without abnormal findings: Secondary | ICD-10-CM | POA: Diagnosis not present

## 2012-08-27 DIAGNOSIS — M899 Disorder of bone, unspecified: Secondary | ICD-10-CM | POA: Diagnosis not present

## 2012-08-27 DIAGNOSIS — Z136 Encounter for screening for cardiovascular disorders: Secondary | ICD-10-CM | POA: Diagnosis not present

## 2012-08-27 DIAGNOSIS — Z124 Encounter for screening for malignant neoplasm of cervix: Secondary | ICD-10-CM | POA: Diagnosis not present

## 2013-01-14 DIAGNOSIS — M25569 Pain in unspecified knee: Secondary | ICD-10-CM | POA: Diagnosis not present

## 2013-01-20 DIAGNOSIS — H538 Other visual disturbances: Secondary | ICD-10-CM | POA: Diagnosis not present

## 2013-01-20 DIAGNOSIS — H43399 Other vitreous opacities, unspecified eye: Secondary | ICD-10-CM | POA: Diagnosis not present

## 2013-01-20 DIAGNOSIS — H2510 Age-related nuclear cataract, unspecified eye: Secondary | ICD-10-CM | POA: Diagnosis not present

## 2013-01-20 DIAGNOSIS — H43819 Vitreous degeneration, unspecified eye: Secondary | ICD-10-CM | POA: Diagnosis not present

## 2013-01-25 DIAGNOSIS — M1710 Unilateral primary osteoarthritis, unspecified knee: Secondary | ICD-10-CM | POA: Diagnosis not present

## 2013-01-29 DIAGNOSIS — M25569 Pain in unspecified knee: Secondary | ICD-10-CM | POA: Diagnosis not present

## 2013-01-29 DIAGNOSIS — M9420 Chondromalacia, unspecified site: Secondary | ICD-10-CM | POA: Diagnosis not present

## 2013-02-28 DIAGNOSIS — L570 Actinic keratosis: Secondary | ICD-10-CM | POA: Diagnosis not present

## 2013-02-28 DIAGNOSIS — L719 Rosacea, unspecified: Secondary | ICD-10-CM | POA: Diagnosis not present

## 2013-02-28 DIAGNOSIS — Z85828 Personal history of other malignant neoplasm of skin: Secondary | ICD-10-CM | POA: Diagnosis not present

## 2013-02-28 DIAGNOSIS — D239 Other benign neoplasm of skin, unspecified: Secondary | ICD-10-CM | POA: Diagnosis not present

## 2013-02-28 DIAGNOSIS — L821 Other seborrheic keratosis: Secondary | ICD-10-CM | POA: Diagnosis not present

## 2013-02-28 DIAGNOSIS — L819 Disorder of pigmentation, unspecified: Secondary | ICD-10-CM | POA: Diagnosis not present

## 2013-02-28 DIAGNOSIS — D1801 Hemangioma of skin and subcutaneous tissue: Secondary | ICD-10-CM | POA: Diagnosis not present

## 2013-07-20 DIAGNOSIS — R109 Unspecified abdominal pain: Secondary | ICD-10-CM | POA: Diagnosis not present

## 2013-07-20 DIAGNOSIS — R300 Dysuria: Secondary | ICD-10-CM | POA: Diagnosis not present

## 2013-07-20 DIAGNOSIS — N390 Urinary tract infection, site not specified: Secondary | ICD-10-CM | POA: Diagnosis not present

## 2013-07-20 DIAGNOSIS — R319 Hematuria, unspecified: Secondary | ICD-10-CM | POA: Diagnosis not present

## 2013-07-29 ENCOUNTER — Other Ambulatory Visit: Payer: Self-pay

## 2013-07-29 DIAGNOSIS — Z1231 Encounter for screening mammogram for malignant neoplasm of breast: Secondary | ICD-10-CM

## 2013-08-14 DIAGNOSIS — Z79899 Other long term (current) drug therapy: Secondary | ICD-10-CM | POA: Diagnosis not present

## 2013-08-14 DIAGNOSIS — Z136 Encounter for screening for cardiovascular disorders: Secondary | ICD-10-CM | POA: Diagnosis not present

## 2013-08-18 ENCOUNTER — Other Ambulatory Visit: Payer: Self-pay | Admitting: Family Medicine

## 2013-08-18 DIAGNOSIS — R42 Dizziness and giddiness: Secondary | ICD-10-CM | POA: Diagnosis not present

## 2013-08-18 DIAGNOSIS — Z136 Encounter for screening for cardiovascular disorders: Secondary | ICD-10-CM | POA: Diagnosis not present

## 2013-08-18 DIAGNOSIS — M899 Disorder of bone, unspecified: Secondary | ICD-10-CM

## 2013-08-18 DIAGNOSIS — Z6828 Body mass index (BMI) 28.0-28.9, adult: Secondary | ICD-10-CM | POA: Diagnosis not present

## 2013-08-18 DIAGNOSIS — Z79899 Other long term (current) drug therapy: Secondary | ICD-10-CM | POA: Diagnosis not present

## 2013-08-18 DIAGNOSIS — Z1331 Encounter for screening for depression: Secondary | ICD-10-CM | POA: Diagnosis not present

## 2013-08-18 DIAGNOSIS — Z0000 Encounter for general adult medical examination without abnormal findings: Secondary | ICD-10-CM | POA: Diagnosis not present

## 2013-08-18 DIAGNOSIS — Z9181 History of falling: Secondary | ICD-10-CM | POA: Diagnosis not present

## 2013-08-29 ENCOUNTER — Ambulatory Visit
Admission: RE | Admit: 2013-08-29 | Discharge: 2013-08-29 | Disposition: A | Discharge status: Home / Self Care | Payer: Medicare Other | Source: Ambulatory Visit

## 2013-08-29 DIAGNOSIS — Z1231 Encounter for screening mammogram for malignant neoplasm of breast: Secondary | ICD-10-CM | POA: Diagnosis not present

## 2013-09-19 ENCOUNTER — Other Ambulatory Visit: Payer: Medicare Other

## 2013-10-14 ENCOUNTER — Ambulatory Visit
Admission: RE | Admit: 2013-10-14 | Discharge: 2013-10-14 | Disposition: A | Discharge status: Home / Self Care | Payer: Medicare Other | Source: Ambulatory Visit | Attending: Family Medicine | Admitting: Family Medicine

## 2013-10-14 DIAGNOSIS — M899 Disorder of bone, unspecified: Secondary | ICD-10-CM | POA: Diagnosis not present

## 2013-10-14 DIAGNOSIS — M949 Disorder of cartilage, unspecified: Secondary | ICD-10-CM | POA: Diagnosis not present

## 2014-03-05 DIAGNOSIS — D485 Neoplasm of uncertain behavior of skin: Secondary | ICD-10-CM | POA: Diagnosis not present

## 2014-03-05 DIAGNOSIS — D1801 Hemangioma of skin and subcutaneous tissue: Secondary | ICD-10-CM | POA: Diagnosis not present

## 2014-03-05 DIAGNOSIS — L821 Other seborrheic keratosis: Secondary | ICD-10-CM | POA: Diagnosis not present

## 2014-03-05 DIAGNOSIS — L819 Disorder of pigmentation, unspecified: Secondary | ICD-10-CM | POA: Diagnosis not present

## 2014-03-05 DIAGNOSIS — Z85828 Personal history of other malignant neoplasm of skin: Secondary | ICD-10-CM | POA: Diagnosis not present

## 2014-03-05 DIAGNOSIS — L719 Rosacea, unspecified: Secondary | ICD-10-CM | POA: Diagnosis not present

## 2014-03-05 DIAGNOSIS — D239 Other benign neoplasm of skin, unspecified: Secondary | ICD-10-CM | POA: Diagnosis not present

## 2014-03-06 DIAGNOSIS — D1801 Hemangioma of skin and subcutaneous tissue: Secondary | ICD-10-CM | POA: Diagnosis not present

## 2014-08-10 DIAGNOSIS — H2513 Age-related nuclear cataract, bilateral: Secondary | ICD-10-CM | POA: Diagnosis not present

## 2014-08-10 DIAGNOSIS — H25013 Cortical age-related cataract, bilateral: Secondary | ICD-10-CM | POA: Diagnosis not present

## 2014-08-10 DIAGNOSIS — H524 Presbyopia: Secondary | ICD-10-CM | POA: Diagnosis not present

## 2014-08-10 DIAGNOSIS — H40013 Open angle with borderline findings, low risk, bilateral: Secondary | ICD-10-CM | POA: Diagnosis not present

## 2014-08-10 DIAGNOSIS — H04123 Dry eye syndrome of bilateral lacrimal glands: Secondary | ICD-10-CM | POA: Diagnosis not present

## 2014-08-12 DIAGNOSIS — F329 Major depressive disorder, single episode, unspecified: Secondary | ICD-10-CM | POA: Diagnosis not present

## 2014-09-15 DIAGNOSIS — Z136 Encounter for screening for cardiovascular disorders: Secondary | ICD-10-CM | POA: Diagnosis not present

## 2014-09-15 DIAGNOSIS — Z79899 Other long term (current) drug therapy: Secondary | ICD-10-CM | POA: Diagnosis not present

## 2014-09-18 DIAGNOSIS — F329 Major depressive disorder, single episode, unspecified: Secondary | ICD-10-CM | POA: Diagnosis not present

## 2014-09-18 DIAGNOSIS — Z23 Encounter for immunization: Secondary | ICD-10-CM | POA: Diagnosis not present

## 2014-09-18 DIAGNOSIS — Z6828 Body mass index (BMI) 28.0-28.9, adult: Secondary | ICD-10-CM | POA: Diagnosis not present

## 2014-09-18 DIAGNOSIS — Z0000 Encounter for general adult medical examination without abnormal findings: Secondary | ICD-10-CM | POA: Diagnosis not present

## 2014-09-18 DIAGNOSIS — M8580 Other specified disorders of bone density and structure, unspecified site: Secondary | ICD-10-CM | POA: Diagnosis not present

## 2014-12-09 DIAGNOSIS — N390 Urinary tract infection, site not specified: Secondary | ICD-10-CM | POA: Diagnosis not present

## 2014-12-09 DIAGNOSIS — N3941 Urge incontinence: Secondary | ICD-10-CM | POA: Diagnosis not present

## 2015-01-27 DIAGNOSIS — R3915 Urgency of urination: Secondary | ICD-10-CM | POA: Diagnosis not present

## 2015-03-09 DIAGNOSIS — Z85828 Personal history of other malignant neoplasm of skin: Secondary | ICD-10-CM | POA: Diagnosis not present

## 2015-03-09 DIAGNOSIS — L821 Other seborrheic keratosis: Secondary | ICD-10-CM | POA: Diagnosis not present

## 2015-03-09 DIAGNOSIS — L570 Actinic keratosis: Secondary | ICD-10-CM | POA: Diagnosis not present

## 2015-03-09 DIAGNOSIS — L719 Rosacea, unspecified: Secondary | ICD-10-CM | POA: Diagnosis not present

## 2015-03-09 DIAGNOSIS — D225 Melanocytic nevi of trunk: Secondary | ICD-10-CM | POA: Diagnosis not present

## 2015-03-09 DIAGNOSIS — L820 Inflamed seborrheic keratosis: Secondary | ICD-10-CM | POA: Diagnosis not present

## 2015-03-09 DIAGNOSIS — D1800 Hemangioma unspecified site: Secondary | ICD-10-CM | POA: Diagnosis not present

## 2015-03-09 DIAGNOSIS — L814 Other melanin hyperpigmentation: Secondary | ICD-10-CM | POA: Diagnosis not present

## 2015-03-25 DIAGNOSIS — Z6829 Body mass index (BMI) 29.0-29.9, adult: Secondary | ICD-10-CM | POA: Diagnosis not present

## 2015-03-25 DIAGNOSIS — Z9181 History of falling: Secondary | ICD-10-CM | POA: Diagnosis not present

## 2015-03-25 DIAGNOSIS — L270 Generalized skin eruption due to drugs and medicaments taken internally: Secondary | ICD-10-CM | POA: Diagnosis not present

## 2015-03-30 DIAGNOSIS — R3915 Urgency of urination: Secondary | ICD-10-CM | POA: Diagnosis not present

## 2015-04-07 DIAGNOSIS — R3915 Urgency of urination: Secondary | ICD-10-CM | POA: Diagnosis not present

## 2015-04-07 DIAGNOSIS — N312 Flaccid neuropathic bladder, not elsewhere classified: Secondary | ICD-10-CM | POA: Diagnosis not present

## 2015-06-28 DIAGNOSIS — Z6830 Body mass index (BMI) 30.0-30.9, adult: Secondary | ICD-10-CM | POA: Diagnosis not present

## 2015-06-28 DIAGNOSIS — J029 Acute pharyngitis, unspecified: Secondary | ICD-10-CM | POA: Diagnosis not present

## 2015-07-06 DIAGNOSIS — N312 Flaccid neuropathic bladder, not elsewhere classified: Secondary | ICD-10-CM | POA: Diagnosis not present

## 2015-07-06 DIAGNOSIS — Z0000 Encounter for general adult medical examination without abnormal findings: Secondary | ICD-10-CM | POA: Diagnosis not present

## 2015-07-06 DIAGNOSIS — R3915 Urgency of urination: Secondary | ICD-10-CM | POA: Diagnosis not present

## 2015-07-07 ENCOUNTER — Other Ambulatory Visit: Payer: Self-pay

## 2015-07-07 DIAGNOSIS — Z1231 Encounter for screening mammogram for malignant neoplasm of breast: Secondary | ICD-10-CM

## 2015-07-12 DIAGNOSIS — K1379 Other lesions of oral mucosa: Secondary | ICD-10-CM | POA: Diagnosis not present

## 2015-07-12 DIAGNOSIS — Z6829 Body mass index (BMI) 29.0-29.9, adult: Secondary | ICD-10-CM | POA: Diagnosis not present

## 2015-08-11 ENCOUNTER — Ambulatory Visit
Admission: RE | Admit: 2015-08-11 | Discharge: 2015-08-11 | Disposition: A | Discharge status: Home / Self Care | Payer: Medicare Other | Source: Ambulatory Visit

## 2015-08-11 DIAGNOSIS — Z1231 Encounter for screening mammogram for malignant neoplasm of breast: Secondary | ICD-10-CM | POA: Diagnosis not present

## 2015-08-24 DIAGNOSIS — E669 Obesity, unspecified: Secondary | ICD-10-CM | POA: Diagnosis not present

## 2015-08-24 DIAGNOSIS — Z6830 Body mass index (BMI) 30.0-30.9, adult: Secondary | ICD-10-CM | POA: Diagnosis not present

## 2015-08-24 DIAGNOSIS — J019 Acute sinusitis, unspecified: Secondary | ICD-10-CM | POA: Diagnosis not present

## 2015-08-31 ENCOUNTER — Ambulatory Visit
Admission: RE | Admit: 2015-08-31 | Discharge: 2015-08-31 | Disposition: A | Discharge status: Home / Self Care | Payer: Medicare Other | Source: Ambulatory Visit | Attending: Allergy and Immunology | Admitting: Allergy and Immunology

## 2015-08-31 ENCOUNTER — Ambulatory Visit (INDEPENDENT_AMBULATORY_CARE_PROVIDER_SITE_OTHER): Payer: Medicare Other | Admitting: Allergy and Immunology

## 2015-08-31 ENCOUNTER — Other Ambulatory Visit: Payer: Self-pay | Admitting: Allergy and Immunology

## 2015-08-31 ENCOUNTER — Encounter: Payer: Self-pay | Admitting: Allergy and Immunology

## 2015-08-31 VITALS — BP 124/80 | HR 68 | Temp 97.9°F | Resp 16 | Ht 65.0 in | Wt 196.2 lb

## 2015-08-31 DIAGNOSIS — R05 Cough: Secondary | ICD-10-CM

## 2015-08-31 DIAGNOSIS — J329 Chronic sinusitis, unspecified: Secondary | ICD-10-CM | POA: Insufficient documentation

## 2015-08-31 DIAGNOSIS — T7840XA Allergy, unspecified, initial encounter: Secondary | ICD-10-CM | POA: Diagnosis not present

## 2015-08-31 DIAGNOSIS — R059 Cough, unspecified: Secondary | ICD-10-CM

## 2015-08-31 MED ORDER — METHYLPREDNISOLONE ACETATE 80 MG/ML IJ SUSP
80.0000 mg | Freq: Once | INTRAMUSCULAR | Status: AC
  Administered 2015-08-31: 80 mg via INTRAMUSCULAR

## 2015-08-31 MED ORDER — AMOXICILLIN 875 MG PO TABS: ORAL_TABLET | ORAL | Status: DC

## 2015-08-31 MED ORDER — MONTELUKAST SODIUM 10 MG PO TABS: 10.0000 mg | ORAL_TABLET | Freq: Every day | ORAL | Status: DC

## 2015-08-31 MED ORDER — AMOXICILLIN-POT CLAVULANATE 875-125 MG PO TABS: ORAL_TABLET | ORAL | Status: DC

## 2015-08-31 NOTE — Progress Notes (Signed)
[place]    NEW PATIENT NOTE  Referring Provider: No ref. provider found Primary Provider: Leonides Sake, MD    Subjective:   Chief Complaint:  Brooke Cantrell is a 69 y.o. female with a chief complaint of Allergic Reaction and Sinus Problem  who presents to the clinic with the following problems:  HPI Comments:  [name] presents this clinic on 08/31/2015 in evaluation of 2 separate issues.  First, she has had 3 reactions over the course of the past several weeks. These reactions are manifested as sore mouth with blisters on the roof of her mouth and some postnasal drip and maybe a slight cough. He's developed within several hours. They usually occur late at nighttime. They last several days. She's not had any specific therapy other than some over-the-counter Benadryl for this issue. She suspects that it may be Portobello mushroom consumption because she ate portobello mushroom onto of these occasions and is well she suspects it may be home grown lettuce consumption with balsamic vinaigrette that she ate on 3 occasions before this reaction occurred. She had no other associated systemic or constitutional symptoms. Specifically, she did not have any skin lesions or blisters or hives.  Secondly, she has had a cough since July. She has some postnasal drip and some occasional throat clearing and maybe some occasional nasal congestion but no anosmia or ugly nasal discharge. She was recently given him Augmentin and she has consumed 6 days of this therapy without any effect. She has no chest tightness or shortness of breath or sputum production and she has no other associated systemic or constitutional symptoms. It should be noted that she did have breast cancer requiring a lumpectomy on her left, chemotherapy and radiation in 2007. She was a smoker greater than 20 years ago but only smoked for approximately 10 years or so.   Past Medical  History  Diagnosis Date  . Breast cancer [facility])     Past Surgical History  Procedure Laterality Date  . Breast cancer surgery    . Tonsillectomy    . Tubal ligation    . Breast enhancement surgery    . Ectopic pregnancy surgery    . Inner ear surgery      No current outpatient prescriptions on file prior to visit.   No current facility-administered medications on file prior to visit.    Meds ordered this encounter  Medications  . amoxicillin-clavulanate (AUGMENTIN) 875-125 MG tablet    Sig: Take 1 tablet along with Amoxicillin twice daily as directed    Dispense:  20 tablet    Refill:  0    Additional 10 days  . amoxicillin (AMOXIL) 875 MG tablet    Sig: Take one tablet along with Augmentin twice daily for total 14 days    Dispense:  28 tablet    Refill:  0  . montelukast (SINGULAIR) 10 MG tablet    Sig: Take 1 tablet (10 mg total) by mouth at bedtime.    Dispense:  30 tablet    Refill:  5  . methylPREDNISolone acetate (DEPO-MEDROL) injection 80 mg    Sig:     Allergies  Allergen Reactions  . Ciprofloxacin Itching and Rash    Review of Systems  Constitutional: Negative for fever, chills and fatigue.  HENT: Negative for congestion, ear pain, facial swelling, hearing loss, nosebleeds, postnasal drip, rhinorrhea, sinus pressure, sneezing, sore throat, tinnitus, trouble swallowing and voice change.   Eyes:  Negative for pain, discharge, redness and itching.  Respiratory: Positive for cough. Negative for chest tightness, shortness of breath and wheezing.   Cardiovascular: Negative for chest pain and leg swelling.  Gastrointestinal: Negative for nausea, vomiting and abdominal pain.  Musculoskeletal: Negative for myalgias and arthralgias.  Skin: Negative for rash.  Allergic/Immunologic: Negative.   Neurological: Negative for dizziness and headaches.  Hematological: Negative for adenopathy.    Family History  Problem Relation Age of Onset  . Allergic rhinitis Father    . Allergic rhinitis Sister   . Allergic rhinitis Brother     Social History   Social History  . Marital Status: Married    Spouse Name: N/A  . Number of Children: N/A  . Years of Education: N/A   Occupational History  . Not on file.   Social History Main Topics  . Smoking status: Former Smoker    Quit date: 09/11/1996  . Smokeless tobacco: Never Used  . Alcohol Use: Not on file  . Drug Use: Not on file  . Sexual Activity: Not on file   Other Topics Concern  . Not on file   Social History Narrative  . No narrative on file    Environmental and Social history  Lives in a house with a dry environment, a cat located inside the household, carpeting in the bedroom, plastic on the bed and pillow, and no smokers located inside the household. She is a housewife.   Objective:   Filed Vitals:   08/31/15 1343  BP: 124/80  Pulse: 68  Temp: 97.9 F (36.6 C)  Resp: 16   Height: 5\' 5"  (165.1 cm) Weight: 196 lb 3.4 oz (89 kg)  Physical Exam  Constitutional: She appears well-developed and well-nourished. No distress.  HENT:  Head: Normocephalic and atraumatic. Head is without right periorbital erythema and without left periorbital erythema.  Right Ear: Tympanic membrane, external ear and ear canal normal. No drainage or tenderness. No foreign bodies. Tympanic membrane is not injected, not scarred, not perforated, not erythematous, not retracted and not bulging. No middle ear effusion.  Left Ear: Tympanic membrane, external ear and ear canal normal. No drainage or tenderness. No foreign bodies. Tympanic membrane is not injected, not scarred, not perforated, not erythematous, not retracted and not bulging.  No middle ear effusion.  Nose: Nose normal. No mucosal edema, rhinorrhea, nose lacerations or sinus tenderness.  No foreign bodies.  Mouth/Throat: Oropharynx is clear and moist. No oropharyngeal exudate, posterior oropharyngeal edema, posterior oropharyngeal erythema or  tonsillar abscesses.  Bilateral hearing aids  Eyes: Lids are normal. Right eye exhibits no chemosis, no discharge and no exudate. No foreign body present in the right eye. Left eye exhibits no chemosis, no discharge and no exudate. No foreign body present in the left eye. Right conjunctiva is not injected. Left conjunctiva is not injected.  Neck: Neck supple. No tracheal tenderness present. No tracheal deviation and no edema present. No thyroid mass and no thyromegaly present.  Cardiovascular: Normal rate, regular rhythm, S1 normal and S2 normal.  Exam reveals no gallop.   No murmur heard. Pulmonary/Chest: No accessory muscle usage or stridor. No respiratory distress. She has no wheezes. She has no rhonchi. She has no rales.  Abdominal: Soft. She exhibits no distension and no mass. There is no tenderness. There is no rebound and no guarding.  Musculoskeletal: She exhibits no edema.  Lymphadenopathy:       Head (right side): No tonsillar adenopathy present.  Head (left side): No tonsillar adenopathy present.    She has no cervical adenopathy.  Neurological: She is alert.  Skin: No rash noted. She is not diaphoretic.  Psychiatric: She has a normal mood and affect. Her behavior is normal.    Diagnostics:  Allergy skin tests were performed. She did not demonstrate any hypersensitivity to a screening panel of foods or aeroallergens  Spirometry was performed and demonstrated an FEV1 of 2.53 @ 109 % of predicted. Following the administration of nebulized albuterol her FEV1 did not change significantly   Assessment and Plan:    1. Chronic sinusitis, unspecified location   2. Cough   3. Allergic reaction, initial encounter      1. Treat inflammation:   A. Depo-Medrol 80 IM now  B. OTC Rhinocort one spray each nostril one time per day. Takes several days to work  C. montelukast 10 mg one tablet one time per day  2. Treat infection:   A. Continue Augmentin 875 one tablet twice a day  for a full 20 day therapy  B. add amoxicillin 875 one tablet twice a day for 14 days  3. Get a chest x-ray  4. Get blood tests - CBC with differential, comp into metabolic panel, sedimentation rate, UA  5. Can add Benadryl if needed  6. Evaluation for immunobullous disease? Yes if recurrent reactions  7. Return to clinic in 3 weeks or earlier if problem  I do not know the etiology of Chinaza's reactions but at this point were given a hold off on any further evaluation except some screening blood tests and just see what happens as we move forward over the course the next several months. A blistering reaction within her mouth also suggest the possibility of a immunobullous disease such as pemphigus or pemphigoid and if she continues to have problems with her oral cavity in the form of blister formation then we will check her blood tests for antibodies directed against these immuneobullous diseases and refer her onto a surgeon for a oral mucosal biopsy. Concerning her cough, at this point I will assume that it is secondary to a prolonged episode of sinusitis and treat her with high-dose amoxicillin along with clavulanic acid and give her some anti-inflammatory medications for her airway as described above. I'll regroup with her the end of her antibiotic administration and will make a decision about how to proceed pending her response. Given her history of breast cancer I will obtain a chest x-ray in investigation of her cough.      Allena Katz, MD [place]

## 2015-08-31 NOTE — Progress Notes (Deleted)
[facility]  Follow-up Note  Refering Provider: No ref. provider found Primary Provider: Leonides Sake, MD  Subjective:   Brooke Cantrell is a 69 y.o. female who returns to the [facility] [place] in re-evaluation of the following:  HPI Comments:     No current outpatient prescriptions on file prior to visit.   No current facility-administered medications on file prior to visit.    No orders of the defined types were placed in this encounter.    Past Medical History  Diagnosis Date  . Breast cancer [facility])     Past Surgical History  Procedure Laterality Date  . Breast cancer surgery    . Tonsillectomy    . Tubal ligation    . Breast enhancement surgery    . Ectopic pregnancy surgery    . Inner ear surgery      Allergies  Allergen Reactions  . Ciprofloxacin Itching and Rash    Review of Systems   Objective:   Filed Vitals:   08/31/15 1343  BP: 124/80  Pulse: 68  Temp: 97.9 F (36.6 C)  Resp: 16   Height: 5\' 5"  (165.1 cm)  Weight: 196 lb 3.4 oz (89 kg)   Physical Exam  Constitutional: She appears well-developed and well-nourished. No distress.  HENT:  Head: Normocephalic and atraumatic. Head is without right periorbital erythema and without left periorbital erythema.  Right Ear: Tympanic membrane, external ear and ear canal normal. No drainage or tenderness. No foreign bodies. Tympanic membrane is not injected, not scarred, not perforated, not erythematous, not retracted and not bulging. No middle ear effusion.  Left Ear: Tympanic membrane, external ear and ear canal normal. No drainage or tenderness. No foreign bodies. Tympanic membrane is not injected, not scarred, not perforated, not erythematous, not retracted and not bulging.  No middle ear effusion.  Nose: Nose normal. No mucosal edema, rhinorrhea, nose lacerations or sinus tenderness.  No foreign bodies.  Mouth/Throat: Oropharynx is  clear and moist. No oropharyngeal exudate, posterior oropharyngeal edema, posterior oropharyngeal erythema or tonsillar abscesses.  Eyes: Lids are normal. Right eye exhibits no chemosis, no discharge and no exudate. No foreign body present in the right eye. Left eye exhibits no chemosis, no discharge and no exudate. No foreign body present in the left eye. Right conjunctiva is not injected. Left conjunctiva is not injected.  Neck: Neck supple. No tracheal tenderness present. No tracheal deviation and no edema present. No thyroid mass and no thyromegaly present.  Cardiovascular: Normal rate, regular rhythm, S1 normal and S2 normal.  Exam reveals no gallop.   No murmur heard. Pulmonary/Chest: No accessory muscle usage or stridor. No respiratory distress. She has no wheezes. She has no rhonchi. She has no rales.  Abdominal: Soft.  Musculoskeletal: She exhibits no edema.  Lymphadenopathy:       Head (right side): No tonsillar adenopathy present.       Head (left side): No tonsillar adenopathy present.    She has no cervical adenopathy.  Neurological: She is alert.  Skin: No rash noted. She is not diaphoretic.  Psychiatric: She has a normal mood and affect. Her behavior is normal.    Diagnostics:    Spirometry was performed and demonstrated an FEV1 of *** at *** % of predicted.  The patient had an Asthma Control Test with the following results:  .    Assessment and Plan:   1. Chronic sinusitis, unspecified location   2.  Cough   3. Allergic reaction, initial encounter      1. Treat inflammation:   A. Depo-Medrol 80 IM now  B. OTC Rhinocort one spray each nostril one time per day. Takes several days to work  C. montelukast 10 mg one tablet one time per day  2. Treat infection:   A. Continue Augmentin 875 one tablet twice a day for a full 20 day therapy  B. add amoxicillin 875 one tablet twice a day for 14 days  3. Get a chest x-ray  4. Get blood tests - CBC with differential, comp  into metabolic panel, sedimentation rate, UA  5. Can add Benadryl if needed  6. Evaluation for immunobullous disease? Yes if recurrent reactions  7. Return to clinic in 3 weeks or earlier if problem     Allena Katz, MD [place]

## 2015-08-31 NOTE — Patient Instructions (Addendum)
  1. Treat inflammation:   A. Depo-Medrol 80 IM now  B. OTC Rhinocort one spray each nostril one time per day. Takes several days to work  C. montelukast 10 mg one tablet one time per day  2. Treat infection:   A. Continue Augmentin 875 one tablet twice a day for a full 20 day therapy  B. add amoxicillin 875 one tablet twice a day for 14 days  3. Get a chest x-ray  4. Get blood tests - CBC with differential, comp into metabolic panel, sedimentation rate, UA  5. Can add Benadryl if needed  6. Evaluation for immunobullous disease? Yes if recurrent reactions  7. Return to clinic in 3 weeks or earlier if problem

## 2015-09-01 ENCOUNTER — Telehealth: Payer: Self-pay | Admitting: Allergy and Immunology

## 2015-09-01 NOTE — Telephone Encounter (Signed)
T/c ref CBC the one we chose will not have diff. Unfortunately we have no way on knowing which one in EPIC to pick since it doesn't show [place] test number

## 2015-09-01 NOTE — Telephone Encounter (Signed)
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

## 2015-09-15 ENCOUNTER — Encounter: Payer: Self-pay | Admitting: Allergy and Immunology

## 2015-09-15 ENCOUNTER — Ambulatory Visit (INDEPENDENT_AMBULATORY_CARE_PROVIDER_SITE_OTHER): Payer: Medicare Other | Admitting: Allergy and Immunology

## 2015-09-15 VITALS — BP 136/78 | HR 68 | Temp 98.0°F | Resp 20

## 2015-09-15 DIAGNOSIS — R05 Cough: Secondary | ICD-10-CM | POA: Diagnosis not present

## 2015-09-15 DIAGNOSIS — J329 Chronic sinusitis, unspecified: Secondary | ICD-10-CM

## 2015-09-15 DIAGNOSIS — R059 Cough, unspecified: Secondary | ICD-10-CM

## 2015-09-15 DIAGNOSIS — T7840XD Allergy, unspecified, subsequent encounter: Secondary | ICD-10-CM | POA: Diagnosis not present

## 2015-09-15 MED ORDER — MONTELUKAST SODIUM 10 MG PO TABS: ORAL_TABLET | ORAL | Status: DC

## 2015-09-15 NOTE — Patient Instructions (Signed)
  1. Treat inflammation:   A. OTC Rhinocort one spray each nostril one time per day.    B. montelukast 10 mg one tablet one time per day  2. Can add Benadryl if needed  3. Evaluation for immunobullous disease? Yes if recurrent reactions  4. Evaluation for cough? Yes, If recurrent  5. Return to clinic in 12 weeks or earlier if problem

## 2015-09-15 NOTE — Progress Notes (Signed)
[facility] [place]  Follow-up Note  Referring Provider: Leonides Sake, MD Primary Provider: Leonides Sake, MD Date of Office Visit: 09/15/2015  Subjective:   Brooke Cantrell is a 70 y.o. female who returns to the [place] in re-evaluation of the following:  HPI Comments:  Brooke Cantrell returns to this clinic on 09/15/2015 in reevaluation of her cough and allergic reactions. Her cough basically resolved but unfortunately she developed an upper respiratory tract infection from a family member and she had nasal congestion and rhinorrhea and slight cough. This appears to be improving. Her chest x-ray did not identify any significant abnormality. She is unfortunately not been consistent about using her Rhinocort and montelukast recently. She's not had any reactions. Specifically, she's not had any blistering in her mouth.   Current Outpatient Prescriptions on File Prior to Visit  Medication Sig Dispense Refill  . alendronate (FOSAMAX) 70 MG tablet Take 70 mg by mouth once a week.  3  . bethanechol (URECHOLINE) 25 MG tablet Take 25 mg by mouth 2 (two) times daily.  11  . Cranberry 250 MG CAPS Take 2 capsules by mouth daily.    Marland Kitchen escitalopram (LEXAPRO) 10 MG tablet Take 10 mg by mouth daily.  3  . ferrous sulfate 325 (65 FE) MG tablet Take 325 mg by mouth daily with breakfast.    . glucosamine-chondroitin 500-400 MG tablet Take 1 tablet by mouth 3 (three) times daily.    . montelukast (SINGULAIR) 10 MG tablet Take 1 tablet (10 mg total) by mouth at bedtime. 30 tablet 5  . Multiple Vitamin (MULTIVITAMIN) capsule Take 1 capsule by mouth daily.    . nitrofurantoin (MACRODANTIN) 100 MG capsule Take 1 capsule by mouth daily as needed.  5  . Omega-3 Fatty Acids (FISH OIL) 1000 MG CAPS Take 1 capsule by mouth.    . Vitamin D, Ergocalciferol, (DRISDOL) 50000 UNITS CAPS capsule Take 50,000 Units by mouth every 7 (seven) days.    Marland Kitchen  amoxicillin (AMOXIL) 875 MG tablet Take one tablet along with Augmentin twice daily for total 14 days (Patient not taking: Reported on 09/15/2015) 28 tablet 0  . amoxicillin-clavulanate (AUGMENTIN) 875-125 MG tablet Take 1 tablet by mouth 2 (two) times daily. Reported on 09/15/2015  0  . amoxicillin-clavulanate (AUGMENTIN) 875-125 MG tablet Take 1 tablet along with Amoxicillin twice daily as directed (Patient not taking: Reported on 09/15/2015) 20 tablet 0   No current facility-administered medications on file prior to visit.    No orders of the defined types were placed in this encounter.    Past Medical History  Diagnosis Date  . Breast cancer [facility])     Past Surgical History  Procedure Laterality Date  . Breast cancer surgery    . Tonsillectomy    . Tubal ligation    . Breast enhancement surgery    . Ectopic pregnancy surgery    . Inner ear surgery      Allergies  Allergen Reactions  . Ciprofloxacin Itching and Rash    Review of systems negative except as noted in HPI / PMHx or noted below:  Review of Systems  Constitutional: Negative.   HENT: Negative.   Eyes: Negative.   Respiratory: Negative.   Cardiovascular: Negative.   Gastrointestinal: Negative.   Genitourinary: Negative.   Musculoskeletal: Negative.   Skin: Negative.   Neurological: Negative.   Endo/Heme/Allergies: Negative.   Psychiatric/Behavioral: Negative.      Objective:  Filed Vitals:   09/15/15 0939  BP: 136/78  Pulse: 68  Temp: 98 F (36.7 C)  Resp: 20          Physical Exam  Constitutional: She is well-developed, well-nourished, and in no distress. No distress.  HENT:  Head: Normocephalic. Head is without right periorbital erythema and without left periorbital erythema.  Right Ear: Tympanic membrane, external ear and ear canal normal.  Left Ear: Tympanic membrane, external ear and ear canal normal.  Nose: Nose normal. No mucosal edema or rhinorrhea.  Mouth/Throat: Oropharynx is clear  and moist and mucous membranes are normal. No oropharyngeal exudate.  Eyes: Conjunctivae and lids are normal. Pupils are equal, round, and reactive to light.  Neck: Trachea normal. No tracheal deviation present. No thyromegaly present.  Cardiovascular: Normal rate, regular rhythm, S1 normal, S2 normal and normal heart sounds.   No murmur heard. Pulmonary/Chest: Effort normal. No stridor. No tachypnea. No respiratory distress. She has no wheezes. She has no rales. She exhibits no tenderness.  Abdominal: Soft. There is no hepatosplenomegaly.  Musculoskeletal: She exhibits no edema or tenderness.  Lymphadenopathy:       Head (right side): No tonsillar adenopathy present.       Head (left side): No tonsillar adenopathy present.    She has no cervical adenopathy.    She has no axillary adenopathy.  Neurological: She is alert. Gait normal.  Skin: No rash noted. She is not diaphoretic. No erythema. No pallor. Nails show no clubbing.  Psychiatric: Mood and affect normal.    Diagnostics:    Spirometry was performed and demonstrated an FEV1 of 2.62 at 113 % of predicted.  The chest x-ray obtained on 08/31/2015 identified no significant abnormality other than atelectasis.   Assessment and Plan:   1. Chronic sinusitis, unspecified location   2. Cough   3. Allergic reaction, subsequent encounter      1. Treat inflammation:   A. OTC Rhinocort one spray each nostril one time per day.    B. montelukast 10 mg one tablet one time per day  2. Can add Benadryl if needed  3. Evaluation for immunobullous disease? Yes if recurrent reactions  4. Evaluation for cough? Yes, If recurrent  5. Return to clinic in 12 weeks or earlier if problem  Brooke Cantrell is doing better and we'll continue to have her use Rhinocort and montelukast at this point in time and assume that she'll do well as we move forward over the course the next several months. Certainly if she has recurrent blistering reactions in her mouth  she'll require further evaluation for an immunobullous disease and she continued to have problems with cough as we move forward she'll certainly require further evaluation.   Allena Katz, MD [place]

## 2015-11-02 LAB — COMPREHENSIVE METABOLIC PANEL
ALBUMIN: 4.3 g/dL (ref 3.6–4.8)
ALK PHOS: 62 IU/L (ref 39–117)
ALT: 24 IU/L (ref 0–32)
AST: 21 IU/L (ref 0–40)
Albumin/Globulin Ratio: 1.4 (ref 1.1–2.5)
BILIRUBIN TOTAL: 0.4 mg/dL (ref 0.0–1.2)
BUN / CREAT RATIO: 25 (ref 11–26)
BUN: 20 mg/dL (ref 8–27)
CO2: 27 mmol/L (ref 18–29)
CREATININE: 0.8 mg/dL (ref 0.57–1.00)
Calcium: 8.5 mg/dL — ABNORMAL LOW (ref 8.7–10.3)
Chloride: 102 mmol/L (ref 96–106)
GFR calc non Af Amer: 76 mL/min/{1.73_m2} (ref >59)
GFR, EST AFRICAN AMERICAN: 88 mL/min/{1.73_m2} (ref >59)
GLOBULIN, TOTAL: 3.1 g/dL (ref 1.5–4.5)
Glucose: 122 mg/dL — ABNORMAL HIGH (ref 65–99)
Potassium: 3.3 mmol/L — ABNORMAL LOW (ref 3.5–5.2)
SODIUM: 139 mmol/L (ref 134–144)
TOTAL PROTEIN: 7.4 g/dL (ref 6.0–8.5)

## 2015-11-02 LAB — URINALYSIS
Bilirubin, UA: NEGATIVE
Glucose, UA: NEGATIVE
KETONES UA: NEGATIVE
LEUKOCYTES UA: NEGATIVE
NITRITE UA: NEGATIVE
Protein, UA: NEGATIVE
Specific Gravity, UA: 1.03 (ref 1.005–1.030)
UUROB: 0.2 mg/dL (ref 0.2–1.0)
pH, UA: 5.5 (ref 5.0–7.5)

## 2015-11-02 LAB — CBC
HEMATOCRIT: 38.4 % (ref 34.0–46.6)
HEMOGLOBIN: 13.1 g/dL (ref 11.1–15.9)
MCH: 30.4 pg (ref 26.6–33.0)
MCHC: 34.1 g/dL (ref 31.5–35.7)
MCV: 89 fL (ref 79–97)
Platelets: 288 10*3/uL (ref 150–379)
RBC: 4.31 x10E6/uL (ref 3.77–5.28)
RDW: 13.6 % (ref 12.3–15.4)
WBC: 12.7 10*3/uL — ABNORMAL HIGH (ref 3.4–10.8)

## 2015-11-02 LAB — SEDIMENTATION RATE: SED RATE: 6 mm/h (ref 0–40)

## 2015-12-01 DIAGNOSIS — R21 Rash and other nonspecific skin eruption: Secondary | ICD-10-CM | POA: Diagnosis not present

## 2015-12-01 DIAGNOSIS — L299 Pruritus, unspecified: Secondary | ICD-10-CM | POA: Diagnosis not present

## 2015-12-30 DIAGNOSIS — M810 Age-related osteoporosis without current pathological fracture: Secondary | ICD-10-CM | POA: Diagnosis not present

## 2015-12-30 DIAGNOSIS — Z23 Encounter for immunization: Secondary | ICD-10-CM | POA: Diagnosis not present

## 2015-12-30 DIAGNOSIS — R21 Rash and other nonspecific skin eruption: Secondary | ICD-10-CM | POA: Diagnosis not present

## 2016-01-11 DIAGNOSIS — I872 Venous insufficiency (chronic) (peripheral): Secondary | ICD-10-CM | POA: Diagnosis not present

## 2016-01-12 ENCOUNTER — Other Ambulatory Visit: Payer: Self-pay | Admitting: Family Medicine

## 2016-01-12 DIAGNOSIS — Z1159 Encounter for screening for other viral diseases: Secondary | ICD-10-CM | POA: Diagnosis not present

## 2016-01-12 DIAGNOSIS — Z0000 Encounter for general adult medical examination without abnormal findings: Secondary | ICD-10-CM | POA: Diagnosis not present

## 2016-01-12 DIAGNOSIS — E2839 Other primary ovarian failure: Secondary | ICD-10-CM | POA: Diagnosis not present

## 2016-01-12 DIAGNOSIS — Z1389 Encounter for screening for other disorder: Secondary | ICD-10-CM | POA: Diagnosis not present

## 2016-01-12 DIAGNOSIS — Z6829 Body mass index (BMI) 29.0-29.9, adult: Secondary | ICD-10-CM | POA: Diagnosis not present

## 2016-01-12 DIAGNOSIS — Z23 Encounter for immunization: Secondary | ICD-10-CM | POA: Diagnosis not present

## 2016-01-12 DIAGNOSIS — Z131 Encounter for screening for diabetes mellitus: Secondary | ICD-10-CM | POA: Diagnosis not present

## 2016-01-13 DIAGNOSIS — Z131 Encounter for screening for diabetes mellitus: Secondary | ICD-10-CM | POA: Diagnosis not present

## 2016-01-13 DIAGNOSIS — Z1159 Encounter for screening for other viral diseases: Secondary | ICD-10-CM | POA: Diagnosis not present

## 2016-01-26 ENCOUNTER — Ambulatory Visit
Admission: RE | Admit: 2016-01-26 | Discharge: 2016-01-26 | Disposition: A | Discharge status: Home / Self Care | Payer: Medicare Other | Source: Ambulatory Visit | Attending: Family Medicine | Admitting: Family Medicine

## 2016-01-26 DIAGNOSIS — Z780 Asymptomatic menopausal state: Secondary | ICD-10-CM | POA: Diagnosis not present

## 2016-01-26 DIAGNOSIS — M8589 Other specified disorders of bone density and structure, multiple sites: Secondary | ICD-10-CM | POA: Diagnosis not present

## 2016-01-26 DIAGNOSIS — E2839 Other primary ovarian failure: Secondary | ICD-10-CM

## 2016-02-25 ENCOUNTER — Other Ambulatory Visit: Payer: Self-pay | Admitting: Gastroenterology

## 2016-02-25 DIAGNOSIS — K5730 Diverticulosis of large intestine without perforation or abscess without bleeding: Secondary | ICD-10-CM | POA: Diagnosis not present

## 2016-02-25 DIAGNOSIS — D126 Benign neoplasm of colon, unspecified: Secondary | ICD-10-CM | POA: Diagnosis not present

## 2016-02-25 DIAGNOSIS — D122 Benign neoplasm of ascending colon: Secondary | ICD-10-CM | POA: Diagnosis not present

## 2016-02-25 DIAGNOSIS — D120 Benign neoplasm of cecum: Secondary | ICD-10-CM | POA: Diagnosis not present

## 2016-02-25 DIAGNOSIS — Z86010 Personal history of colonic polyps: Secondary | ICD-10-CM | POA: Diagnosis not present

## 2016-05-04 DIAGNOSIS — L814 Other melanin hyperpigmentation: Secondary | ICD-10-CM | POA: Diagnosis not present

## 2016-05-04 DIAGNOSIS — L570 Actinic keratosis: Secondary | ICD-10-CM | POA: Diagnosis not present

## 2016-05-04 DIAGNOSIS — D225 Melanocytic nevi of trunk: Secondary | ICD-10-CM | POA: Diagnosis not present

## 2016-05-04 DIAGNOSIS — D485 Neoplasm of uncertain behavior of skin: Secondary | ICD-10-CM | POA: Diagnosis not present

## 2016-05-04 DIAGNOSIS — Z85828 Personal history of other malignant neoplasm of skin: Secondary | ICD-10-CM | POA: Diagnosis not present

## 2016-05-04 DIAGNOSIS — L821 Other seborrheic keratosis: Secondary | ICD-10-CM | POA: Diagnosis not present

## 2016-05-04 DIAGNOSIS — L719 Rosacea, unspecified: Secondary | ICD-10-CM | POA: Diagnosis not present

## 2016-05-04 DIAGNOSIS — D1800 Hemangioma unspecified site: Secondary | ICD-10-CM | POA: Diagnosis not present

## 2016-05-05 DIAGNOSIS — L570 Actinic keratosis: Secondary | ICD-10-CM | POA: Diagnosis not present

## 2016-05-09 ENCOUNTER — Other Ambulatory Visit: Payer: Self-pay

## 2016-05-10 DIAGNOSIS — M19112 Post-traumatic osteoarthritis, left shoulder: Secondary | ICD-10-CM | POA: Diagnosis not present

## 2016-05-10 DIAGNOSIS — M542 Cervicalgia: Secondary | ICD-10-CM | POA: Diagnosis not present

## 2016-05-10 DIAGNOSIS — M25511 Pain in right shoulder: Secondary | ICD-10-CM | POA: Diagnosis not present

## 2016-05-31 DIAGNOSIS — L570 Actinic keratosis: Secondary | ICD-10-CM | POA: Diagnosis not present

## 2016-05-31 DIAGNOSIS — Z23 Encounter for immunization: Secondary | ICD-10-CM | POA: Diagnosis not present

## 2016-05-31 DIAGNOSIS — Z411 Encounter for cosmetic surgery: Secondary | ICD-10-CM | POA: Diagnosis not present

## 2016-06-07 DIAGNOSIS — M542 Cervicalgia: Secondary | ICD-10-CM | POA: Diagnosis not present

## 2016-06-07 DIAGNOSIS — M19112 Post-traumatic osteoarthritis, left shoulder: Secondary | ICD-10-CM | POA: Diagnosis not present

## 2016-06-07 DIAGNOSIS — M25511 Pain in right shoulder: Secondary | ICD-10-CM | POA: Diagnosis not present

## 2016-07-05 ENCOUNTER — Ambulatory Visit (INDEPENDENT_AMBULATORY_CARE_PROVIDER_SITE_OTHER): Payer: Medicare Other | Admitting: Sports Medicine

## 2016-07-05 ENCOUNTER — Encounter (INDEPENDENT_AMBULATORY_CARE_PROVIDER_SITE_OTHER): Payer: Self-pay | Admitting: Sports Medicine

## 2016-07-05 VITALS — BP 130/87 | HR 91 | Ht 65.0 in

## 2016-07-05 DIAGNOSIS — G8929 Other chronic pain: Secondary | ICD-10-CM | POA: Diagnosis not present

## 2016-07-05 DIAGNOSIS — M25511 Pain in right shoulder: Secondary | ICD-10-CM

## 2016-07-05 DIAGNOSIS — M25512 Pain in left shoulder: Secondary | ICD-10-CM

## 2016-07-05 DIAGNOSIS — M4302 Spondylolysis, cervical region: Secondary | ICD-10-CM

## 2016-07-05 DIAGNOSIS — Z853 Personal history of malignant neoplasm of breast: Secondary | ICD-10-CM

## 2016-07-05 NOTE — Patient Instructions (Addendum)
We are ordering an MRI for you today.   Imaging will be calling you to schedule your appointment.  Immediately after you schedule your appointment with them please call our office back [phone]) to schedule a follow-up appointment with me.  This will need to be at least 24 hours after you have the test done to give radiologist and myself time to review the test.  We will discuss further treatment options at your follow up appointment.

## 2016-07-05 NOTE — Progress Notes (Signed)
Brooke Cantrell - 70 y.o. female MRN [id number]  Date of birth: 03-03-1946  Office Visit Note: Visit Date: 07/05/2016 PCP: No PCP Per Patient Referred by: Leonides Sake, MD  Assessment & Plan: Visit Diagnoses:  1. Spondylolysis, cervical region   2. Chronic pain of both shoulders   3. History of breast cancer     Plan:   persistent bilateral shoulder pain & neck pain consistent with cervical spondylosis.  8 weeks of appropriate conservative care with home exercise program, anti-inflammatories, gabapentin. Persistent pain without focal red flag symptoms. Continue gabapentin as well as home program will have her follow up for MRI for consideration of further interventions.    Patient Instructions  We are ordering an MRI for you today.  [facility] will be calling you to schedule your appointment.  Immediately after you schedule your appointment with them please call our office back [phone]) to schedule a follow-up appointment with me.  This will need to be at least 24 hours after you have the test done to give radiologist and myself time to review the test.  We will discuss further treatment options at your follow up appointment.     Meds & Orders: No orders of the defined types were placed in this encounter.   Orders Placed This Encounter  Procedures  . MR CERVICAL SPINE WO CONTRAST    Follow-up: Return for MRI review.   Procedures: No procedures performed  No notes on file   Clinical History: Findings:  Multilevel degenerative changes of cervical spine with  underlying osteoarthritis of the bilateral shoulders.  Status post RIGHT intra-articular injection with 1 week of improvement of the right shoulder.   She reports that she quit smoking about 19 years ago. She has never used smokeless tobacco. No results for input(s): HGBA1C, LABURIC in the last 8760 hours.  Subjective: Chief Complaint  Patient presents with  . Right Shoulder - Follow-up  . Left Shoulder -  Follow-up   HPI: Patient is status post intra-articular injection 4 weeks ago of her right shoulder. She has had a moderate amount of improvement in her focal right shoulder pain continues to have intermittent left & right trapezial area pain. Pain occasionally radiates down the lateral aspect of her shoulders is causing significant pain & discomfort especially while she performs any type of motion involving cervical side bending or rotation. Pain tends to alternate between the left & right side occasionally occurs in bilateral shoulders. She denies any significant numbness or tingling in the hands. She has had decreased grip strength for quite some time & attributes this to some underlying osteoarthritis of the hands.   Patient states that she is not sure if it is feeling any better.  Pain comes and goes. Some movement just hurts, patient states.  Injection helped for about a week.  Denies fevers, chills, recent weight gain or weight loss.  No night sweats.  She does have some nighttime awakenings due to this issue. Gabapentin has been helpful for this. She did try to wean off of gabapentin however had to resume it due to worsening symptoms.    ROS Otherwise per HPI.  Objective:  VS:  HT:5\' 5"  (165.1 cm)   WT:   BMI:     BP:130/87  HR:91bpm  TEMP: ( )  RESP:  Physical Exam  Constitutional: No distress.  HENT:  Head: Normocephalic and atraumatic.  Eyes: Right eye exhibits no discharge. Left eye exhibits no discharge. No scleral icterus.  Pulmonary/Chest:  Effort normal. No respiratory distress.  Musculoskeletal:  Mild crepitation with shoulder motion but non-focally tender. She has limited cervical side bending to the right hand with positive Spurling's compression test. Pain with brachial plexus squeeze & arm squeeze test. IR & ER strength intact. Upper extremity sensation intact to light touch without dysthesia.  Neurological: She is alert.  Appropriately interactive.  Skin: Skin is warm  and dry. No rash noted. She is not diaphoretic. No erythema. No pallor.  Psychiatric: Judgment normal.    Ortho Exam Imaging: No results found.  Past Medical/Family/Surgical/Social History: Patient Active Problem List   Diagnosis Date Noted  . Chronic infection of sinus 08/31/2015  . Cough 08/31/2015  . Allergic reaction 08/31/2015   Past Medical History:  Diagnosis Date  . Breast cancer ([place])    Family History  Problem Relation Age of Onset  . Allergic rhinitis Father   . Allergic rhinitis Sister   . Allergic rhinitis Brother    Past Surgical History:  Procedure Laterality Date  . breast cancer surgery    . BREAST ENHANCEMENT SURGERY    . ECTOPIC PREGNANCY SURGERY    . INNER EAR SURGERY    . TONSILLECTOMY    . TUBAL LIGATION     Social History   Occupational History  . Not on file.   Social History Main Topics  . Smoking status: Former Smoker    Quit date: 09/11/1996  . Smokeless tobacco: Never Used  . Alcohol use Not on file  . Drug use: Unknown  . Sexual activity: Not on file

## 2016-07-07 ENCOUNTER — Other Ambulatory Visit: Payer: Self-pay | Admitting: Physician Assistant

## 2016-07-07 DIAGNOSIS — Z1231 Encounter for screening mammogram for malignant neoplasm of breast: Secondary | ICD-10-CM

## 2016-07-11 ENCOUNTER — Ambulatory Visit
Admission: RE | Admit: 2016-07-11 | Discharge: 2016-07-11 | Disposition: A | Discharge status: Home / Self Care | Payer: Medicare Other | Source: Ambulatory Visit | Attending: Sports Medicine | Admitting: Sports Medicine

## 2016-07-11 DIAGNOSIS — M50222 Other cervical disc displacement at C5-C6 level: Secondary | ICD-10-CM | POA: Diagnosis not present

## 2016-07-11 DIAGNOSIS — M50223 Other cervical disc displacement at C6-C7 level: Secondary | ICD-10-CM | POA: Diagnosis not present

## 2016-07-11 DIAGNOSIS — M4302 Spondylolysis, cervical region: Secondary | ICD-10-CM

## 2016-07-11 DIAGNOSIS — M25512 Pain in left shoulder: Secondary | ICD-10-CM

## 2016-07-11 DIAGNOSIS — G8929 Other chronic pain: Secondary | ICD-10-CM

## 2016-07-11 DIAGNOSIS — M50221 Other cervical disc displacement at C4-C5 level: Secondary | ICD-10-CM | POA: Diagnosis not present

## 2016-07-11 DIAGNOSIS — Z853 Personal history of malignant neoplasm of breast: Secondary | ICD-10-CM

## 2016-07-11 DIAGNOSIS — M25511 Pain in right shoulder: Secondary | ICD-10-CM

## 2016-07-13 ENCOUNTER — Ambulatory Visit (INDEPENDENT_AMBULATORY_CARE_PROVIDER_SITE_OTHER): Payer: Medicare Other | Admitting: Sports Medicine

## 2016-07-13 ENCOUNTER — Encounter (INDEPENDENT_AMBULATORY_CARE_PROVIDER_SITE_OTHER): Payer: Self-pay | Admitting: Sports Medicine

## 2016-07-13 VITALS — BP 131/82 | HR 87 | Ht 65.0 in | Wt 185.0 lb

## 2016-07-13 DIAGNOSIS — M5020 Other cervical disc displacement, unspecified cervical region: Secondary | ICD-10-CM | POA: Diagnosis not present

## 2016-07-13 MED ORDER — GABAPENTIN 300 MG PO CAPS: ORAL_CAPSULE | ORAL | 3 refills | Status: DC

## 2016-07-13 NOTE — Progress Notes (Signed)
Brooke Cantrell - 70 y.o. female MRN [id number]  Date of birth: 26-Feb-1946  Office Visit Note: Visit Date: 07/13/2016 PCP: No PCP Per Patient Referred by: No ref. provider found  Subjective: Chief Complaint  Patient presents with  . Right Shoulder - Pain  . Left Shoulder - Pain  . MRI results   HPI: Patient is here to discuss MRI results.  She is continuing to have significant left greater than right arm pain other she reports a episode over the weekend where she was playing basketball with her granddaughter & performed a cervical hyperextension motion & had severe significant shooting pain down into her right arm. She reports can tenuously having pain across the top of both shoulders. Pain has been awakening her at night. She is having hard time getting comfortable & was she has in a comfortable position quickly becomes uncomfortable once again. She is taking gabapentin twice per day. It does not seem to be helping her at night is much as it previously was.  Pt denies any change in bowel or bladder habits, muscle weakness, numbness or falls associated with this pain.  Denies fevers, chills, recent weight gain or weight loss.  No night sweats.     ROS Otherwise per HPI.  Assessment & Plan: Visit Diagnoses:  1. HNP (herniated nucleus pulposus), cervical     Plan: Findings:  Patient is having multiple issues including arthritis bilateral shoulders but I suspect the majority of her pain is coming from the large disc that she has. Epidural steroid injection for both diagnostic & therapeutic purposes indicated. We'll go ahead & try to set her up with this & she returns for an upcoming trip to [place]. Additionally starting in December she will be living in [place] for the winter. I like for her to titrate her gabapentin up & we'll plan to follow up with her as needed after the injection with Dr. Ernestina Patches. Surgical intervention may be warranted if any lack of improvement.    Meds & Orders:    Meds ordered this encounter  Medications  . gabapentin (NEURONTIN) 300 MG capsule    Sig: 1 tab po with breakfast and lunch + 2 tabs po qhs    Dispense:  120 capsule    Refill:  3    Orders Placed This Encounter  Procedures  . Ambulatory referral to Physical Medicine Rehab    Follow-up: Return if symptoms worsen or fail to improve.   Procedures: No procedures performed  No notes on file   Clinical History: MRI on 07/11/2016: Cervical spine: C5/C6 large extruded fragment migrating caudally to the right of midline. Effacement of ventral subarachnoid space without cord change. Additional left-sided spondylosis C3-4 & C4-5 foraminal narrowing.  She reports that she quit smoking about 19 years ago. She has never used smokeless tobacco. No results for input(s): HGBA1C, LABURIC in the last 8760 hours.  Objective:  VS:  HT:5\' 5"  (165.1 cm)   WT:185 lb (83.9 kg)  BMI:30.8    BP:131/82  HR:87bpm  TEMP: ( )  RESP:  Physical Exam  Constitutional: No distress.  HENT:  Head: Normocephalic and atraumatic.  Eyes: Right eye exhibits no discharge. Left eye exhibits no discharge. No scleral icterus.  Pulmonary/Chest: Effort normal. No respiratory distress.  Musculoskeletal:  Minimal pain with cervical extension. Some pain with brachial plexus squeeze bilaterally. No significant upper extremity radiculopathy. Pain with arm squeeze test bilaterally.  Neurological: She is alert.  Appropriately interactive.  Skin: Skin is warm and  dry. No rash noted. She is not diaphoretic. No erythema. No pallor.  Psychiatric: Judgment normal.    Ortho Exam Imaging: Mr Cervical Spine Wo Contrast  Result Date: 07/11/2016 CLINICAL DATA:  Left shoulder in arm pain, 1 year duration. Some right shoulder and arm pain over the last 4 months. EXAM: MRI CERVICAL SPINE WITHOUT CONTRAST TECHNIQUE: Multiplanar, multisequence MR imaging of the cervical spine was performed. No intravenous contrast was administered.  COMPARISON:  Radiography 05/10/2016. FINDINGS: Alignment: Normal Vertebrae: No fracture or primary bone lesion. Cord: No primary cord lesion. Posterior Fossa, vertebral arteries, paraspinal tissues: Posterior fossa unremarkable. Disc levels: Ordinary osteoarthritis at the C1-2 articulation.  No stenosis. C2-3: Posterior element fusion on the left. No stenosis of the canal or foramina. C3-4: Spondylosis with endplate osteophytes and protruding disc material more prominent towards the left. Narrowing of the ventral subarachnoid space but no effect upon the cord. Foraminal stenosis on the left that could compress the left C4 nerve root. C4-5: Spondylosis with endplate osteophytes and bulging disc material. No affect upon the cord. Moderate foraminal encroachment bilaterally. C5-6: Large disc herniation with extruded fragment migrated caudally to the right of midline. This effaces the ventral subarachnoid space and contacts the right side of the cord. Proximal foraminal encroachment on the right could affect the right C6 nerve root. C6-7: Shallow disc protrusion without significant canal or foraminal stenosis. C7-T1:  Normal interspace. 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 Effacement of the ventral subarachnoid space with contact of the right side of the cord. Encroachment upon the proximal foramen on the right could affect at least the right C6 nerve root. 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 Spondylosis at C4-5 with bilateral foraminal narrowing of a mild-to-moderate degree. Electronically Signed   By: Nelson Chimes M.D.   On: 07/11/2016 16:19    Past Medical/Family/Surgical/Social History: Medications & Allergies reviewed per EMR Patient Active Problem List   Diagnosis Date Noted  . Chronic infection of sinus 08/31/2015  . Cough 08/31/2015  . Allergic reaction 08/31/2015   Past Medical History:    Diagnosis Date  . Breast cancer ([place])    Family History  Problem Relation Age of Onset  . Allergic rhinitis Father   . Allergic rhinitis Sister   . Allergic rhinitis Brother    Past Surgical History:  Procedure Laterality Date  . breast cancer surgery    . BREAST ENHANCEMENT SURGERY    . ECTOPIC PREGNANCY SURGERY    . INNER EAR SURGERY    . TONSILLECTOMY    . TUBAL LIGATION     Social History   Occupational History  . Not on file.   Social History Main Topics  . Smoking status: Former Smoker    Quit date: 09/11/1996  . Smokeless tobacco: Never Used  . Alcohol use Not on file  . Drug use: Unknown  . Sexual activity: Not on file

## 2016-07-20 ENCOUNTER — Ambulatory Visit (INDEPENDENT_AMBULATORY_CARE_PROVIDER_SITE_OTHER): Payer: Medicare Other | Admitting: Physical Medicine and Rehabilitation

## 2016-07-20 ENCOUNTER — Encounter (INDEPENDENT_AMBULATORY_CARE_PROVIDER_SITE_OTHER): Payer: Self-pay | Admitting: Physical Medicine and Rehabilitation

## 2016-07-20 ENCOUNTER — Telehealth (INDEPENDENT_AMBULATORY_CARE_PROVIDER_SITE_OTHER): Payer: Self-pay | Admitting: Physical Medicine and Rehabilitation

## 2016-07-20 ENCOUNTER — Ambulatory Visit (INDEPENDENT_AMBULATORY_CARE_PROVIDER_SITE_OTHER): Payer: Medicare Other | Admitting: Sports Medicine

## 2016-07-20 VITALS — BP 157/95 | HR 90

## 2016-07-20 DIAGNOSIS — M5010 Cervical disc disorder with radiculopathy, unspecified cervical region: Secondary | ICD-10-CM

## 2016-07-20 DIAGNOSIS — M5412 Radiculopathy, cervical region: Secondary | ICD-10-CM

## 2016-07-20 NOTE — Patient Instructions (Signed)

## 2016-07-20 NOTE — Telephone Encounter (Signed)
No precert required for Medicare

## 2016-07-20 NOTE — Progress Notes (Signed)
Brooke Cantrell - 70 y.o. female MRN [id number]  Date of birth: 06/21/1946  Office Visit Note: Visit Date: 07/20/2016 PCP: No PCP Per Patient Referred by: Gerda Diss, DO  Subjective: Chief Complaint  Patient presents with  . Neck - Pain   HPI: Brooke Cantrell is a pleasant 70 year old female who is followed by Dr. Paulla Fore. She has had a chronic many year history of left neck and shoulder pain which has been worsened with activity and she feels like she has less mobility with that left arm. This is been ongoing situation for again many years. She has tried all manner of conservative care and has had some therapy and chiropractic care. She has had several months now worsening right shoulder pain. She reported to him that she had played some basketball with the grandchild and had severe worsening of her right shoulder pain. An MRI of the cervical spine was obtained and this is reviewed below and a specialty comments. She tells me to that she's been painting a lot in the left arm is now feeling worse after painting. She gets a lot of symptoms just sitting and having her arms at rest. Putting her arm above her head does seem to help. She also does get some pain with trying to extend her arm to her back with range of motion. She denies any paresthesias into the hands. She does get some shoulder blade pain at times that she has not really reported in the past. She's not had any associated headaches. She does feel like she is very stiff in the neck and shoulders. Again the MRI is reviewed which shows a very large disc herniation at C5-6 particularly right paracentral indenting the cord but without any cord signal changes. Her pain is quite severe and limiting in the MRI is pretty significant in its appearance.   Neck Pain   This is a chronic problem. The problem has been gradually worsening. The pain is present in the left side and right side. The symptoms are aggravated by position. Pertinent negatives  include no chest pain, fever, photophobia, weakness or weight loss.   Left arm pain was worse. Did a lot of painting and the right side became worse.Feels like a rubber band is around upper arm. The pain comes and goes. No numbness or tingling. Review of Systems  Constitutional: Negative for chills, fever, malaise/fatigue and weight loss.  HENT: Negative for hearing loss and sinus pain.   Eyes: Negative for blurred vision, double vision and photophobia.  Respiratory: Negative for cough and shortness of breath.   Cardiovascular: Negative for chest pain, palpitations and leg swelling.  Gastrointestinal: Negative for abdominal pain, nausea and vomiting.  Genitourinary: Negative for flank pain.  Musculoskeletal: Positive for neck pain. Negative for myalgias.  Skin: Negative for itching and rash.  Neurological: Negative for tremors, focal weakness and weakness.  Endo/Heme/Allergies: Negative.   Psychiatric/Behavioral: Negative for depression and suicidal ideas.  All other systems reviewed and are negative.  Otherwise per HPI.  Assessment & Plan: Visit Diagnoses:  1. Cervical radiculopathy   2. Cervical disc disorder with radiculopathy, unspecified cervical region     Plan: Findings:  Chronic worsening severe neck and bilateral shoulder pain with long-term left sided shoulder pain to about the mid shoulder and shoulder blade now with several month history of right-sided shoulder pain to about the mid shoulder and shoulder blade. No radicular pattern into the hands. Seems to be a classic C5 radicular pattern. MRI reviewed shows  large disc herniation at C5-6 with extruded fragment. She actually from a clinical standpoint is doing better than he would expect looking at the MRI. Her symptoms are still very severe and limiting. Exam does not show focal weakness. I do think it is reasonable to complete a cervical epidural injection and we will schedule her for this. In all likelihood of the disc fragment  will resorbed to some degree. We've counseled her on the fact that she gets progressive weakness or if there is any other signs of bowel or bladder problems that this could turn into a surgical issue. I spent more than 25 minutes speaking face-to-face with the patient with 50% of the time in counseling. Depending on the roles results of the epidural injection she may wish to continue with Dr. Paulla Fore for management of her shoulders I do think she is having some intrinsic shoulder issues as well. I think this probably is multifactorial. She also has some myofascial pain but I think this is probably triggered from the nerve root irritation.    Meds & Orders: No orders of the defined types were placed in this encounter.  No orders of the defined types were placed in this encounter.   Follow-up: Return for Schedule for C7-T1 interlaminar epidural injection.   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 Effacement of the ventral subarachnoid space with contact of the right side of the cord. Encroachment upon the proximal foramen on the right could affect at least the right C6 nerve root.  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  Spondylosis at C4-5 with bilateral foraminal narrowing of a mild-to-moderate degree.  She reports that she quit smoking about 19 years ago. She has never used smokeless tobacco. No results for input(s): HGBA1C, LABURIC in the last 8760 hours.  Objective:  VS:  HT:    WT:   BMI:     BP:(!) 157/95  HR:90bpm  TEMP: ( )  RESP:  Physical Exam  Constitutional: She is oriented to person, place, and time. She appears well-developed and well-nourished. No distress.  HENT:  Head: Normocephalic and atraumatic.  Nose: Nose normal.  Mouth/Throat: Oropharynx is clear and moist.    Eyes: Conjunctivae are normal. Pupils are equal, round, and reactive to light.  Neck: Normal range of motion. Neck supple.  Cardiovascular: Regular rhythm and intact distal pulses.   Pulmonary/Chest: Effort normal and breath sounds normal.  Abdominal: She exhibits no distension.  Musculoskeletal:  Cervical spine range of motion is very limited in ranges particularly with extension and rotation. She has an equivocal Spurling's test and does seem to increase the pain on the right more than the left. She has good strength in the upper extremities without any deficits. She does seem to have decreased reflexes at the biceps bilaterally. She is somewhat tense with R to get a good reflex but I did see reflexes at the brachioradialis bilaterally. She has a negative Hoffmann's test bilaterally. She does have some shoulder impingement signs with external rotation particularly on the right more than left but bilaterally. She has intact sensation to light touch in all dermatomal patterns.  Neurological: She is alert and oriented to person, place, and time.  Skin: Skin is warm.  Psychiatric: She has a normal mood and affect. Her behavior is normal.  Nursing note and vitals reviewed.  Ortho Exam Imaging: No results found.  Past Medical/Family/Surgical/Social History: Medications & Allergies reviewed per EMR Patient Active Problem List   Diagnosis Date Noted  . Chronic infection of sinus 08/31/2015  . Cough 08/31/2015  . Allergic reaction 08/31/2015   Past Medical History:  Diagnosis Date  . Breast cancer ([place])    Family History  Problem Relation Age of Onset  . Allergic rhinitis Father   . Allergic rhinitis Sister   . Allergic rhinitis Brother    Past Surgical History:  Procedure Laterality Date  . breast cancer surgery    . BREAST ENHANCEMENT SURGERY    . ECTOPIC PREGNANCY SURGERY    . INNER EAR SURGERY    . TONSILLECTOMY    . TUBAL LIGATION     Social History   Occupational  History  . Not on file.   Social History Main Topics  . Smoking status: Former Smoker    Quit date: 09/11/1996  . Smokeless tobacco: Never Used  . Alcohol use Not on file  . Drug use: Unknown  . Sexual activity: Not on file

## 2016-07-31 ENCOUNTER — Encounter (INDEPENDENT_AMBULATORY_CARE_PROVIDER_SITE_OTHER): Payer: Self-pay | Admitting: Physical Medicine and Rehabilitation

## 2016-07-31 ENCOUNTER — Ambulatory Visit (INDEPENDENT_AMBULATORY_CARE_PROVIDER_SITE_OTHER): Payer: Medicare Other | Admitting: Physical Medicine and Rehabilitation

## 2016-07-31 VITALS — BP 161/103 | HR 83 | Temp 98.5°F

## 2016-07-31 DIAGNOSIS — M5010 Cervical disc disorder with radiculopathy, unspecified cervical region: Secondary | ICD-10-CM | POA: Diagnosis not present

## 2016-07-31 DIAGNOSIS — M5412 Radiculopathy, cervical region: Secondary | ICD-10-CM | POA: Diagnosis not present

## 2016-07-31 MED ORDER — METHYLPREDNISOLONE ACETATE 80 MG/ML IJ SUSP
80.0000 mg | Freq: Once | INTRAMUSCULAR | Status: AC
  Administered 2016-07-31: 80 mg

## 2016-07-31 MED ORDER — LIDOCAINE HCL (PF) 1 % IJ SOLN
0.3300 mL | Freq: Once | INTRAMUSCULAR | Status: AC
  Administered 2016-07-31: 0.3 mL

## 2016-07-31 NOTE — Progress Notes (Signed)
Brooke Cantrell - 70 y.o. female MRN [id number]  Date of birth: 1945-10-16  Office Visit Note: Visit Date: 07/31/2016 PCP: No PCP Per Patient Referred by: No ref. provider found  Subjective: Chief Complaint  Patient presents with  . Neck - Pain   HPI: Brooke Cantrell is a 70 year old female that recently saw evaluation and management. She normally sees Dr. Paulla Fore for her care. She is here for a planned left C7-T1 intralaminar epidural steroid injection. She did have some questions today once again about her disc problem being more on the right on the MRI her pain on the left and we answered his questions as best we can. Her husband is present today and asked some questions as well we tried to answer those. We'll have her come back in a couple weeks for repeat injection if needed. Otherwise she'll follow-up with Dr. Paulla Fore.    More discomfort- 90%- on left side than right. Pain unchanged. ROS Otherwise per HPI.  Assessment & Plan: Visit Diagnoses:  1. Cervical radiculopathy   2. Cervical disc disorder with radiculopathy     Plan: Findings:  Plan today is C7-T1 intralaminar epidural steroid injection for disc herniation and left more than right radicular shoulder pain.    Meds & Orders:  Meds ordered this encounter  Medications  . lidocaine (PF) (XYLOCAINE) 1 % injection 0.3 mL  . methylPREDNISolone acetate (DEPO-MEDROL) injection 80 mg    Orders Placed This Encounter  Procedures  . Epidural Steroid injection    Follow-up: Return in about 2 weeks (around 08/14/2016) for Repeat C7-T1 interlaminar epidural steroid injection if needed..   Procedures: No procedures performed  Cervical Epidural Steroid Injection - Interlaminar Approach with Fluoroscopic Guidance  Patient: Brooke Cantrell      Date of Birth: 11-10-1945 MRN: [id number] PCP: No PCP Per Patient      Visit Date: 07/31/2016   Universal Protocol:    Date/Time: 11/20/178:28 AM  Consent Given By: the patient  Position:  PRONE  Additional Comments: Vital signs were monitored before and after the procedure. Patient was prepped and draped in the usual sterile fashion. The correct patient, procedure, and site was verified.   Injection Procedure Details:  Procedure Site One Meds Administered:  Meds ordered this encounter  Medications  . lidocaine (PF) (XYLOCAINE) 1 % injection 0.3 mL  . methylPREDNISolone acetate (DEPO-MEDROL) injection 80 mg     Laterality: Left  Location/Site:  C7-T1  Needle size: 20 G  Needle type: Touhy  Needle Placement: Paramedian epidural space  Findings:  -Contrast Used: 1 mL iohexol 180 mg iodine/mL   -Comments: Excellent flow of contrast into the epidural space.  Procedure Details: Using a paramedian approach from the side mentioned above, the region overlying the inferior lamina was localized under fluoroscopic visualization and the soft tissues overlying this structure were infiltrated with 4 ml. of 1% Lidocaine without Epinephrine. A # 20 gauge, Tuohy needle was inserted into the epidural space using a paramedian approach.  The epidural space was localized using loss of resistance along with lateral and contralateral oblique bi-planar fluoroscopic views.  After negative aspirate for air, blood, and CSF, a 2 ml. volume of Isovue-250 was injected into the epidural space and the flow of contrast was observed. Radiographs were obtained for documentation purposes.   The injectate was administered into the level noted above.  Additional Comments:  The patient tolerated the procedure well Dressing: Band-Aid    Post-procedure details: Patient was observed during the procedure. Post-procedure  instructions were reviewed.  Patient left the clinic in stable condition.     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 Effacement of the ventral subarachnoid  space with contact of the right side of the cord. Encroachment upon the proximal foramen on the right could affect at least the right C6 nerve root.  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  Spondylosis at C4-5 with bilateral foraminal narrowing of a mild-to-moderate degree.  She reports that she quit smoking about 19 years ago. She has never used smokeless tobacco. No results for input(s): HGBA1C, LABURIC in the last 8760 hours.  Objective:  VS:  HT:    WT:   BMI:     BP:(!) 161/103  HR:83bpm  TEMP:98.5 F (36.9 C)( )  RESP:98 % Physical Exam  Ortho Exam Imaging: No results found.  Past Medical/Family/Surgical/Social History: Medications & Allergies reviewed per EMR Patient Active Problem List   Diagnosis Date Noted  . Chronic infection of sinus 08/31/2015  . Cough 08/31/2015  . Allergic reaction 08/31/2015   Past Medical History:  Diagnosis Date  . Breast cancer ([place])    Family History  Problem Relation Age of Onset  . Allergic rhinitis Father   . Allergic rhinitis Sister   . Allergic rhinitis Brother    Past Surgical History:  Procedure Laterality Date  . breast cancer surgery    . BREAST ENHANCEMENT SURGERY    . ECTOPIC PREGNANCY SURGERY    . INNER EAR SURGERY    . TONSILLECTOMY    . TUBAL LIGATION     Social History   Occupational History  . Not on file.   Social History Main Topics  . Smoking status: Former Smoker    Quit date: 09/11/1996  . Smokeless tobacco: Never Used  . Alcohol use Not on file  . Drug use: Unknown  . Sexual activity: Not on file

## 2016-07-31 NOTE — Patient Instructions (Signed)

## 2016-07-31 NOTE — Procedures (Signed)
Cervical Epidural Steroid Injection - Interlaminar Approach with Fluoroscopic Guidance  Patient: Brooke Cantrell      Date of Birth: 03-01-1946 MRN: [id number] PCP: No PCP Per Patient      Visit Date: 07/31/2016   Universal Protocol:    Date/Time: 11/20/178:28 AM  Consent Given By: the patient  Position: PRONE  Additional Comments: Vital signs were monitored before and after the procedure. Patient was prepped and draped in the usual sterile fashion. The correct patient, procedure, and site was verified.   Injection Procedure Details:  Procedure Site One Meds Administered:  Meds ordered this encounter  Medications  . lidocaine (PF) (XYLOCAINE) 1 % injection 0.3 mL  . methylPREDNISolone acetate (DEPO-MEDROL) injection 80 mg     Laterality: Left  Location/Site:  C7-T1  Needle size: 20 G  Needle type: Touhy  Needle Placement: Paramedian epidural space  Findings:  -Contrast Used: 1 mL iohexol 180 mg iodine/mL   -Comments: Excellent flow of contrast into the epidural space.  Procedure Details: Using a paramedian approach from the side mentioned above, the region overlying the inferior lamina was localized under fluoroscopic visualization and the soft tissues overlying this structure were infiltrated with 4 ml. of 1% Lidocaine without Epinephrine. A # 20 gauge, Tuohy needle was inserted into the epidural space using a paramedian approach.  The epidural space was localized using loss of resistance along with lateral and contralateral oblique bi-planar fluoroscopic views.  After negative aspirate for air, blood, and CSF, a 2 ml. volume of Isovue-250 was injected into the epidural space and the flow of contrast was observed. Radiographs were obtained for documentation purposes.   The injectate was administered into the level noted above.  Additional Comments:  The patient tolerated the procedure well Dressing: Band-Aid    Post-procedure details: Patient was observed during  the procedure. Post-procedure instructions were reviewed.  Patient left the clinic in stable condition.

## 2016-08-11 ENCOUNTER — Ambulatory Visit
Admission: RE | Admit: 2016-08-11 | Discharge: 2016-08-11 | Disposition: A | Discharge status: Home / Self Care | Payer: Medicare Other | Source: Ambulatory Visit | Attending: Physician Assistant | Admitting: Physician Assistant

## 2016-08-11 DIAGNOSIS — Z1231 Encounter for screening mammogram for malignant neoplasm of breast: Secondary | ICD-10-CM | POA: Diagnosis not present

## 2016-08-14 ENCOUNTER — Encounter (INDEPENDENT_AMBULATORY_CARE_PROVIDER_SITE_OTHER): Payer: Self-pay | Admitting: Physical Medicine and Rehabilitation

## 2016-08-14 ENCOUNTER — Ambulatory Visit (INDEPENDENT_AMBULATORY_CARE_PROVIDER_SITE_OTHER): Payer: Medicare Other | Admitting: Physical Medicine and Rehabilitation

## 2016-08-14 VITALS — BP 150/99 | HR 81 | Temp 98.1°F

## 2016-08-14 DIAGNOSIS — M5010 Cervical disc disorder with radiculopathy, unspecified cervical region: Secondary | ICD-10-CM | POA: Diagnosis not present

## 2016-08-14 DIAGNOSIS — M5412 Radiculopathy, cervical region: Secondary | ICD-10-CM | POA: Diagnosis not present

## 2016-08-14 MED ORDER — LIDOCAINE HCL (PF) 1 % IJ SOLN
0.3300 mL | Freq: Once | INTRAMUSCULAR | Status: AC
  Administered 2016-08-14: 0.3 mL

## 2016-08-14 MED ORDER — METHYLPREDNISOLONE ACETATE 80 MG/ML IJ SUSP
80.0000 mg | Freq: Once | INTRAMUSCULAR | Status: AC
  Administered 2016-08-14: 80 mg

## 2016-08-14 NOTE — Progress Notes (Signed)
Brooke Cantrell - 70 y.o. female MRN [id number]  Date of birth: Oct 08, 1945  Office Visit Note: Visit Date: 08/14/2016 PCP: No PCP Per Patient Referred by: No ref. provider found  Subjective: No chief complaint on file.  HPI: Brooke Cantrell is a very pleasant 70 year old female who lives part of the time in [place] and will be gone for the next several months. Patient states she did well with last injection. Had around 50% relief. Lt side worse than right. Worse at night with laying and notices it a lot while sitting in car while traveling. Gets relief with elevating arm on pillow while riding in car. She does have a lot of questions today about steroid medications and injection numbers etc. I did go over those at length with her again and gave her a pretty thorough understanding of injections and limitations and how there is a lot of misinformation about numbers. We also went over the diagnostic challenges of neck and shoulder pain. She did again get 50% relief and in fact the first day she had almost 100% relief and then slow steady return of symptoms but still better than when she started. She is somewhat anxious and she has sort of perseverate over different symptoms and what to do and what not to do. She does do a lot of work outdoors and says that in the garden it tends to flare things up quite a bit. Again she does much better than expected given the imaging findings in her cervical spine. She denies any focal weakness. She does get some paresthesias. She's had no balance difficulties or weakness.    Review of Systems  Constitutional: Negative for chills, fever, malaise/fatigue and weight loss.  HENT: Negative for hearing loss and sinus pain.   Eyes: Negative for blurred vision, double vision and photophobia.  Respiratory: Negative for cough and shortness of breath.   Cardiovascular: Negative for chest pain, palpitations and leg swelling.  Gastrointestinal: Negative for abdominal pain, nausea  and vomiting.  Genitourinary: Negative for flank pain.  Musculoskeletal: Positive for neck pain. Negative for myalgias.  Skin: Negative for itching and rash.  Neurological: Positive for tingling. Negative for tremors, focal weakness and weakness.  Endo/Heme/Allergies: Negative.   Psychiatric/Behavioral: Negative for depression.  All other systems reviewed and are negative.  Otherwise per HPI.  Assessment & Plan: Visit Diagnoses:  1. Cervical radiculopathy   2. Cervical disc disorder with radiculopathy     Plan: Findings:  Chronic history of neck pain with headaches and tension headaches and muscle tightness and left shoulder pain. She's been under the care of Dr. Paulla Fore from an orthopedic standpoint. We completed cervical epidural injection with good relief of at least a lot of her symptoms in the neck and musculature. He said his shoulder pain. She has a lot of questions today about medications and injections and really questions and we've answered before without a go over them again today. We spent 20 minutes talking with her about her condition. We also talked about what to do in [place] when she is day over the next 3 months. We also talked about activity modification and exercises etc. More than 50% of that time was in direct counseling. We also decided to complete a second injection today given the fact that the other injection did help quite a bit. She was still having symptoms and I hope to get her better so she can travel to [place] for the next 3 months.    Meds & Orders:  Meds ordered this encounter  Medications  . lidocaine (PF) (XYLOCAINE) 1 % injection 0.3 mL  . methylPREDNISolone acetate (DEPO-MEDROL) injection 80 mg    Orders Placed This Encounter  Procedures  . Epidural Steroid injection    Follow-up: Return Dr. Paulla Fore in 2 weeks if symptoms worsen or fail to improve.   Procedures: No procedures performed  Cervical Epidural Steroid Injection - Interlaminar Approach  with Fluoroscopic Guidance  Patient: Brooke Cantrell      Date of Birth: [birth year] MRN: [id number] PCP: No PCP Per Patient      Visit Date: 08/14/2016   Universal Protocol:    Date/Time: 08/14/1709:27 AM  Consent Given By: the patient  Position: PRONE  Additional Comments: Vital signs were monitored before and after the procedure. Patient was prepped and draped in the usual sterile fashion. The correct patient, procedure, and site was verified.   Injection Procedure Details:  Procedure Site One Meds Administered:  Meds ordered this encounter  Medications  . lidocaine (PF) (XYLOCAINE) 1 % injection 0.3 mL  . methylPREDNISolone acetate (DEPO-MEDROL) injection 80 mg     Laterality: Left  Location/Site:  C7-T1  Needle size: 20 G  Needle type: Touhy  Needle Placement: Paramedian epidural space  Findings:  -Contrast Used: 1 mL iohexol 180 mg iodine/mL   -Comments: Excellent flow of contrast into the epidural space.  Procedure Details: Using a paramedian approach from the side mentioned above, the region overlying the inferior lamina was localized under fluoroscopic visualization and the soft tissues overlying this structure were infiltrated with 4 ml. of 1% Lidocaine without Epinephrine. A # 20 gauge, Tuohy needle was inserted into the epidural space using a paramedian approach.  The epidural space was localized using loss of resistance along with lateral and contralateral oblique bi-planar fluoroscopic views.  After negative aspirate for air, blood, and CSF, a 2 ml. volume of Isovue-250 was injected into the epidural space and the flow of contrast was observed. Radiographs were obtained for documentation purposes.   The injectate was administered into the level noted above.  Additional Comments:  The patient tolerated the procedure well Dressing: Band-Aid    Post-procedure details: Patient was observed during the procedure. Post-procedure instructions were  reviewed.  Patient left the clinic in stable condition.     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 Effacement of the ventral subarachnoid space with contact of the right side of the cord. Encroachment upon the proximal foramen on the right could affect at least the right C6 nerve root.  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  Spondylosis at C4-5 with bilateral foraminal narrowing of a mild-to-moderate degree.  She reports that she quit smoking about 19 years ago. She has never used smokeless tobacco. No results for input(s): HGBA1C, LABURIC in the last 8760 hours.  Objective:  VS:  HT:    WT:   BMI:     BP:(!) 150/99  HR:81bpm  TEMP:98.1 F (36.7 C)( )  RESP:99 % Physical Exam  Constitutional: She is oriented to person, place, and time. She appears well-developed and well-nourished.  Eyes: Conjunctivae and EOM are normal. Pupils are equal, round, and reactive to light.  Cardiovascular: Normal rate and intact distal pulses.   Pulmonary/Chest: Effort normal.  Musculoskeletal:  Cervical range of motion seems to be more full with greater range of motion but she does get pain at end ranges. She  has some trigger points in the levator scapula and trapezius and did not reproduce all of her pain. Still with some impingement sign on the left shoulder. Good distal strength.  Neurological: She is alert and oriented to person, place, and time.  Skin: Skin is warm and dry. No rash noted. No erythema.  Psychiatric: She has a normal mood and affect. Her behavior is normal.  Nursing note and vitals reviewed.   Ortho Exam Imaging: No results found.  Past Medical/Family/Surgical/Social History: Medications & Allergies reviewed per EMR Patient Active Problem List   Diagnosis Date Noted  . Cervical disc disorder with radiculopathy 08/14/2016   . Chronic infection of sinus 08/31/2015  . Cough 08/31/2015  . Allergic reaction 08/31/2015   Past Medical History:  Diagnosis Date  . Breast cancer ([place])    Family History  Problem Relation Age of Onset  . Allergic rhinitis Father   . Allergic rhinitis Sister   . Allergic rhinitis Brother    Past Surgical History:  Procedure Laterality Date  . breast cancer surgery    . BREAST ENHANCEMENT SURGERY    . ECTOPIC PREGNANCY SURGERY    . INNER EAR SURGERY    . TONSILLECTOMY    . TUBAL LIGATION     Social History   Occupational History  . Not on file.   Social History Main Topics  . Smoking status: Former Smoker    Quit date: 09/11/1996  . Smokeless tobacco: Never Used  . Alcohol use Not on file  . Drug use: Unknown  . Sexual activity: Not on file

## 2016-08-14 NOTE — Procedures (Signed)
Cervical Epidural Steroid Injection - Interlaminar Approach with Fluoroscopic Guidance  Patient: Brooke Cantrell      Date of Birth: 1945-10-03 MRN: [id number] PCP: No PCP Per Patient      Visit Date: 08/14/2016   Universal Protocol:    Date/Time: 08/14/1709:27 AM  Consent Given By: the patient  Position: PRONE  Additional Comments: Vital signs were monitored before and after the procedure. Patient was prepped and draped in the usual sterile fashion. The correct patient, procedure, and site was verified.   Injection Procedure Details:  Procedure Site One Meds Administered:  Meds ordered this encounter  Medications  . lidocaine (PF) (XYLOCAINE) 1 % injection 0.3 mL  . methylPREDNISolone acetate (DEPO-MEDROL) injection 80 mg     Laterality: Left  Location/Site:  C7-T1  Needle size: 20 G  Needle type: Touhy  Needle Placement: Paramedian epidural space  Findings:  -Contrast Used: 1 mL iohexol 180 mg iodine/mL   -Comments: Excellent flow of contrast into the epidural space.  Procedure Details: Using a paramedian approach from the side mentioned above, the region overlying the inferior lamina was localized under fluoroscopic visualization and the soft tissues overlying this structure were infiltrated with 4 ml. of 1% Lidocaine without Epinephrine. A # 20 gauge, Tuohy needle was inserted into the epidural space using a paramedian approach.  The epidural space was localized using loss of resistance along with lateral and contralateral oblique bi-planar fluoroscopic views.  After negative aspirate for air, blood, and CSF, a 2 ml. volume of Isovue-250 was injected into the epidural space and the flow of contrast was observed. Radiographs were obtained for documentation purposes.   The injectate was administered into the level noted above.  Additional Comments:  The patient tolerated the procedure well Dressing: Band-Aid    Post-procedure details: Patient was observed  during the procedure. Post-procedure instructions were reviewed.  Patient left the clinic in stable condition.

## 2016-08-14 NOTE — Patient Instructions (Signed)

## 2017-01-16 DIAGNOSIS — Z79899 Other long term (current) drug therapy: Secondary | ICD-10-CM | POA: Diagnosis not present

## 2017-01-16 DIAGNOSIS — Z136 Encounter for screening for cardiovascular disorders: Secondary | ICD-10-CM | POA: Diagnosis not present

## 2017-01-16 DIAGNOSIS — Z131 Encounter for screening for diabetes mellitus: Secondary | ICD-10-CM | POA: Diagnosis not present

## 2017-01-16 DIAGNOSIS — R7303 Prediabetes: Secondary | ICD-10-CM | POA: Diagnosis not present

## 2017-01-16 DIAGNOSIS — Z6829 Body mass index (BMI) 29.0-29.9, adult: Secondary | ICD-10-CM | POA: Diagnosis not present

## 2017-01-16 DIAGNOSIS — Z23 Encounter for immunization: Secondary | ICD-10-CM | POA: Diagnosis not present

## 2017-01-16 DIAGNOSIS — Z0000 Encounter for general adult medical examination without abnormal findings: Secondary | ICD-10-CM | POA: Diagnosis not present

## 2017-01-26 DIAGNOSIS — H40013 Open angle with borderline findings, low risk, bilateral: Secondary | ICD-10-CM | POA: Diagnosis not present

## 2017-01-26 DIAGNOSIS — H2513 Age-related nuclear cataract, bilateral: Secondary | ICD-10-CM | POA: Diagnosis not present

## 2017-01-26 DIAGNOSIS — H25013 Cortical age-related cataract, bilateral: Secondary | ICD-10-CM | POA: Diagnosis not present

## 2017-01-26 DIAGNOSIS — H40033 Anatomical narrow angle, bilateral: Secondary | ICD-10-CM | POA: Diagnosis not present

## 2017-03-25 ENCOUNTER — Other Ambulatory Visit (INDEPENDENT_AMBULATORY_CARE_PROVIDER_SITE_OTHER): Payer: Self-pay | Admitting: Sports Medicine

## 2017-03-25 DIAGNOSIS — M5020 Other cervical disc displacement, unspecified cervical region: Secondary | ICD-10-CM

## 2017-03-26 DIAGNOSIS — H2511 Age-related nuclear cataract, right eye: Secondary | ICD-10-CM | POA: Diagnosis not present

## 2017-03-26 DIAGNOSIS — H2513 Age-related nuclear cataract, bilateral: Secondary | ICD-10-CM | POA: Diagnosis not present

## 2017-04-02 ENCOUNTER — Telehealth (INDEPENDENT_AMBULATORY_CARE_PROVIDER_SITE_OTHER): Payer: Self-pay

## 2017-04-02 NOTE — Telephone Encounter (Signed)
Pt left vm requesting another injection. Had Lt C7-T1 IL on 08/14/16. Ok to repeat?

## 2017-04-02 NOTE — Telephone Encounter (Signed)
Helped more than 50% etc, large disc herniation

## 2017-04-02 NOTE — Telephone Encounter (Signed)
Pt will be out of town from 04/12/17 to 04/25/17. scheduled for 04/26/17 @ 1:30 with driver and no bts.

## 2017-04-23 ENCOUNTER — Other Ambulatory Visit (INDEPENDENT_AMBULATORY_CARE_PROVIDER_SITE_OTHER): Payer: Self-pay | Admitting: Sports Medicine

## 2017-04-23 DIAGNOSIS — M5020 Other cervical disc displacement, unspecified cervical region: Secondary | ICD-10-CM

## 2017-04-26 ENCOUNTER — Encounter (INDEPENDENT_AMBULATORY_CARE_PROVIDER_SITE_OTHER): Payer: Medicare Other | Admitting: Physical Medicine and Rehabilitation

## 2017-05-08 DIAGNOSIS — L570 Actinic keratosis: Secondary | ICD-10-CM | POA: Diagnosis not present

## 2017-05-08 DIAGNOSIS — D3610 Benign neoplasm of peripheral nerves and autonomic nervous system, unspecified: Secondary | ICD-10-CM | POA: Diagnosis not present

## 2017-05-08 DIAGNOSIS — L814 Other melanin hyperpigmentation: Secondary | ICD-10-CM | POA: Diagnosis not present

## 2017-05-08 DIAGNOSIS — Z85828 Personal history of other malignant neoplasm of skin: Secondary | ICD-10-CM | POA: Diagnosis not present

## 2017-05-08 DIAGNOSIS — D225 Melanocytic nevi of trunk: Secondary | ICD-10-CM | POA: Diagnosis not present

## 2017-05-08 DIAGNOSIS — D1800 Hemangioma unspecified site: Secondary | ICD-10-CM | POA: Diagnosis not present

## 2017-05-08 DIAGNOSIS — L821 Other seborrheic keratosis: Secondary | ICD-10-CM | POA: Diagnosis not present

## 2017-05-08 DIAGNOSIS — Z411 Encounter for cosmetic surgery: Secondary | ICD-10-CM | POA: Diagnosis not present

## 2017-05-17 ENCOUNTER — Encounter (INDEPENDENT_AMBULATORY_CARE_PROVIDER_SITE_OTHER): Payer: Self-pay | Admitting: Orthopaedic Surgery

## 2017-05-17 ENCOUNTER — Ambulatory Visit (INDEPENDENT_AMBULATORY_CARE_PROVIDER_SITE_OTHER): Payer: Medicare Other | Admitting: Orthopaedic Surgery

## 2017-05-17 DIAGNOSIS — G8929 Other chronic pain: Secondary | ICD-10-CM | POA: Diagnosis not present

## 2017-05-17 DIAGNOSIS — M5020 Other cervical disc displacement, unspecified cervical region: Secondary | ICD-10-CM | POA: Diagnosis not present

## 2017-05-17 DIAGNOSIS — M25512 Pain in left shoulder: Secondary | ICD-10-CM

## 2017-05-17 DIAGNOSIS — M5010 Cervical disc disorder with radiculopathy, unspecified cervical region: Secondary | ICD-10-CM | POA: Diagnosis not present

## 2017-05-17 MED ORDER — GABAPENTIN 300 MG PO CAPS: ORAL_CAPSULE | ORAL | 0 refills | Status: DC

## 2017-05-17 NOTE — Progress Notes (Signed)
Office Visit Note   Patient: Brooke Cantrell           Date of Birth: 07-Jan-1946           MRN: [id number] Visit Date: 05/17/2017              Requested by: No referring provider defined for this encounter. PCP: Patient, No Pcp Per   Assessment & Plan: Visit Diagnoses:  1. Cervical disc disorder with radiculopathy   2. Chronic left shoulder pain   3. HNP (herniated nucleus pulposus), cervical     Plan: I actually agree that her symptoms are mainly associated with her left shoulder. She has significant glenohumeral arthritis on plain films from 2017. I feel is most appropriate to continue her Neurontin but also to have an intra-articular steroid injection of left shoulder by Dr. Ernestina Patches under direct fluoroscopy. She agrees with this as well. We will work on setting this injection up and then I will see her back in about 4 weeks to see how she is doing overall. At that visit I would like an AP and axillary view of her left shoulder.  Follow-Up Instructions: Return in about 4 weeks (around 06/14/2017).   Orders:  No orders of the defined types were placed in this encounter.  Meds ordered this encounter  Medications  . gabapentin (NEURONTIN) 300 MG capsule    Sig: TAKE 1 CAPSULE BY MOUTH WITH BREAKFAST AND LUNCH, AND 2 CAPSULES AT BEDTIME - office visit before next refill    Dispense:  120 capsule    Refill:  0    Office visit needed prior to next refill      Procedures: No procedures performed   Clinical Data: No additional findings.   Subjective: Chief Complaint  Patient presents with  . Neck - Pain  The patient's 1 haven't seen for long period of time. She saw Dr. Paulla Fore last year and was diagnosed with a herniated disc in her cervical spine and actually saw Dr. Ernestina Patches for injections in her neck. Her main pain though seems to be in her left shoulder. At the time was felt that the cervical spine issues were actually radiating to her shoulder. She said her shoulder level left  side hurts mainly at night and with certain activities she said her range of motion is significantly limited at that left shoulder as well. She denies any weakness in either arm. She has no symptoms of weakness or radicular symptoms on the right side and no shoulder pain in the right side. Apparently the herniated disc and cervical spine was to the right side at C5-C6. She does have x-rays of her shoulders from August 2017 that I can review. She's been on Neurontin for long period time and does request a refill of Neurontin as this helps her the most at nighttime. She states that her shoulder mobility is significantly limited all left side.  HPI  Review of Systems He currently denies any headache, chest pain, shortness of breath, fever, chills, nausea, vomiting.  Objective: Vital Signs: There were no vitals taken for this visit.  Physical Exam She is alert and oriented 3 and in no acute distress Ortho Exam She has excellent range of motion of her cervical spine and no radicular symptoms on either left or right upper extremity. She has full range of motion of her right shoulder left shoulder limitation is significantly limited and I can feel grinding of the glenohumeral joint. She has weakness the rotator cuff and  significant pain past 90 of abduction. Specialty Comments:  MRI on 07/11/2016: Cervical spine: C5/C6 large extruded fragment migrating caudally to the right of midline. Effacement of ventral subarachnoid space without cord change. Additional left-sided spondylosis C3-4 & C4-5 foraminal narrowing.  Imaging: No results found. I independently reviewed the shoulder x-rays from 2017. She has significant glenohumeral arthritis on the left shoulder seen on both the AP view and especially the axillary view with significant loss of the joint space.  PMFS History: Patient Active Problem List   Diagnosis Date Noted  . 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 Chronic infection  of sinus 08/31/2015  . Cough 08/31/2015  . Allergic reaction 08/31/2015   Past Medical History:  Diagnosis Date  . Breast cancer ([place])     Family History  Problem Relation Age of Onset  . Allergic rhinitis Father   . Allergic rhinitis Sister   . Allergic rhinitis Brother     Past Surgical History:  Procedure Laterality Date  . breast cancer surgery    . BREAST ENHANCEMENT SURGERY    . ECTOPIC PREGNANCY SURGERY    . INNER EAR SURGERY    . TONSILLECTOMY    . TUBAL LIGATION     Social History   Occupational History  . Not on file.   Social History Main Topics  . Smoking status: Former Smoker    Quit date: 09/11/1996  . Smokeless tobacco: Never Used  . Alcohol use Not on file  . Drug use: Unknown  . Sexual activity: Not on file

## 2017-05-22 ENCOUNTER — Ambulatory Visit (INDEPENDENT_AMBULATORY_CARE_PROVIDER_SITE_OTHER): Payer: Medicare Other

## 2017-05-22 ENCOUNTER — Ambulatory Visit (INDEPENDENT_AMBULATORY_CARE_PROVIDER_SITE_OTHER): Payer: Medicare Other | Admitting: Physical Medicine and Rehabilitation

## 2017-05-22 DIAGNOSIS — M25512 Pain in left shoulder: Secondary | ICD-10-CM | POA: Diagnosis not present

## 2017-05-22 DIAGNOSIS — G8929 Other chronic pain: Secondary | ICD-10-CM | POA: Diagnosis not present

## 2017-05-22 NOTE — Patient Instructions (Signed)

## 2017-05-22 NOTE — Progress Notes (Signed)
Brooke Cantrell - 71 y.o. female MRN [id number]  Date of birth: 06-23-1946  Office Visit Note: Visit Date: 05/22/2017 PCP: Patient, No Pcp Per Referred by: No ref. provider found  Subjective: Chief Complaint  Patient presents with  . Left Shoulder - Pain   HPI: Brooke Cantrell is a 71 year old right-hand dominant female patient that is been followed by Dr. Ninfa Linden in [place]. Pa[place] Fore. We've actually seen her and completed cervical epidural injection without much relief. She has significant disc herniation on MRI from 2017 at C5-6. She has left shoulder pain with decreased range of motion significantly. Dr. Ninfa Linden has been following her most recently and feels like her shoulder pain may be related to significant glenohumeral joint arthritis. We are going to complete a diagnostic no fully therapeutic anesthetic glenohumeral joint arthrogram on the left.    ROS Otherwise per HPI.  Assessment & Plan: Visit Diagnoses:  1. Chronic left shoulder pain     Plan: Findings:  Diagnostic of therapeutic left anesthetic glenohumeral joint arthrogram with fluoroscopic guidance. The injection arthrogram showed well-placed medication without extravasation. Unfortunately patient didn't seem to have much in the way of relief during the anesthetic phase of the injection. She still had fairly limited range of motion.    Meds & Orders: No orders of the defined types were placed in this encounter.   Orders Placed This Encounter  Procedures  . Large Joint Injection/Arthrocentesis  . XR C-ARM NO REPORT    Follow-up: Return for Dr. Ninfa Linden.   Procedures: Diagnostic and hopefully therapeutic anesthetic glenohumeral joint arthrogram Date/Time: 05/22/2017 2:42 PM Performed by: Magnus Sinning Authorized by: Magnus Sinning   Consent Given by:  Patient Site marked: the procedure site was marked   Timeout: prior to procedure the correct patient, procedure, and site was verified   Indications:  Pain and  diagnostic evaluation Location:  Shoulder Site:  L glenohumeral Prep: patient was prepped and draped in usual sterile fashion   Needle Size:  22 G Needle Length:  3.5 inches Approach:  Anteromedial Ultrasound Guidance: No   Fluoroscopic Guidance: No   Arthrogram: Yes   Medications:  3 mL bupivacaine 0.5 %; 80 mg triamcinolone acetonide 40 MG/ML Aspiration Attempted: No   Patient tolerance:  Patient tolerated the procedure well with no immediate complications  Arthrogram demonstrated excellent flow of contrast throughout the joint surface without extravasation or obvious defect.  The patient did not get much relief during the anesthetic phase of the injection.     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 Effacement of the ventral subarachnoid space with contact of the right side of the cord. Encroachment upon the proximal foramen on the right could affect at least the right C6 nerve root.  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  Spondylosis at C4-5 with bilateral foraminal narrowing of a mild-to-moderate degree.  She reports that she quit smoking about 20 years ago. She has never used smokeless tobacco. No results for input(s): HGBA1C, LABURIC in the last 8760 hours.  Objective:  VS:  HT:    WT:   BMI:     BP:   HR: bpm  TEMP: ( )  RESP:  Physical Exam  Musculoskeletal:  Left shoulder pain with abduction and extension and internal rotation. She has pretty good forward flexion.    Ortho Exam Imaging: Xr C-arm No Report  Result Date: 05/22/2017 Please see Notes or Procedures tab for imaging impression.   Past Medical/Family/Surgical/Social History: Medications & Allergies reviewed per EMR Patient Active Problem List   Diagnosis Date Noted  . Chronic left shoulder pain 05/17/2017  . Cervical disc  disorder with radiculopathy 08/14/2016  . Chronic infection of sinus 08/31/2015  . Cough 08/31/2015  . Allergic reaction 08/31/2015   Past Medical History:  Diagnosis Date  . Breast cancer ([place])    Family History  Problem Relation Age of Onset  . Allergic rhinitis Father   . Allergic rhinitis Sister   . Allergic rhinitis Brother    Past Surgical History:  Procedure Laterality Date  . breast cancer surgery    . BREAST ENHANCEMENT SURGERY    . ECTOPIC PREGNANCY SURGERY    . INNER EAR SURGERY    . TONSILLECTOMY    . TUBAL LIGATION     Social History   Occupational History  . Not on file.   Social History Main Topics  . Smoking status: Former Smoker    Quit date: 09/11/1996  . Smokeless tobacco: Never Used  . Alcohol use Not on file  . Drug use: Unknown  . Sexual activity: Not on file

## 2017-05-22 NOTE — Progress Notes (Deleted)
Left shoulder pain for over a year. No radiating pain or weakness in arm. Pain mainly at night and with certain movements.

## 2017-05-23 MED ORDER — BUPIVACAINE HCL 0.5 % IJ SOLN
3.0000 mL | INTRAMUSCULAR | Status: AC | PRN
  Administered 2017-05-22: 3 mL via INTRA_ARTICULAR

## 2017-05-23 MED ORDER — TRIAMCINOLONE ACETONIDE 40 MG/ML IJ SUSP
80.0000 mg | INTRAMUSCULAR | Status: AC | PRN
Start: 2017-05-22 — End: 2017-05-22
  Administered 2017-05-22: 80 mg via INTRA_ARTICULAR

## 2017-06-12 DIAGNOSIS — H2511 Age-related nuclear cataract, right eye: Secondary | ICD-10-CM | POA: Diagnosis not present

## 2017-06-13 ENCOUNTER — Other Ambulatory Visit (INDEPENDENT_AMBULATORY_CARE_PROVIDER_SITE_OTHER): Payer: Self-pay | Admitting: Orthopaedic Surgery

## 2017-06-13 DIAGNOSIS — H2512 Age-related nuclear cataract, left eye: Secondary | ICD-10-CM | POA: Diagnosis not present

## 2017-06-13 DIAGNOSIS — M5020 Other cervical disc displacement, unspecified cervical region: Secondary | ICD-10-CM

## 2017-06-13 NOTE — Telephone Encounter (Signed)
Please advise 

## 2017-06-14 ENCOUNTER — Ambulatory Visit (INDEPENDENT_AMBULATORY_CARE_PROVIDER_SITE_OTHER): Payer: Medicare Other | Admitting: Orthopaedic Surgery

## 2017-06-14 ENCOUNTER — Other Ambulatory Visit (INDEPENDENT_AMBULATORY_CARE_PROVIDER_SITE_OTHER): Payer: Self-pay

## 2017-06-14 DIAGNOSIS — M19012 Primary osteoarthritis, left shoulder: Secondary | ICD-10-CM | POA: Diagnosis not present

## 2017-06-14 DIAGNOSIS — M25512 Pain in left shoulder: Principal | ICD-10-CM

## 2017-06-14 DIAGNOSIS — G8929 Other chronic pain: Secondary | ICD-10-CM

## 2017-06-14 NOTE — Progress Notes (Signed)
The patient is following up after a left shoulder intra-articular injection at the glenohumeral joint. She says that is been wonderful is helped significantly. Her range of motion she says is improving overall as well. She can reach behind her better. Her husband also states that he has noticed a significant difference in terms of her pain. She only takes Neurontin now as needed.  On exam she has much improved motion of her left shoulder but there still certainly some pain of the glenohumeral joint.  They actually live in [place] in the winter time. I do feel would be helpful to have an intra-articular left shoulder injection either December 3 or fourth prior to them leaving further four-month stay in [place] for the later time. She like to have injection then as well. She'll continue work on range of motion of her left shoulder so she does not become stiff.

## 2017-06-19 DIAGNOSIS — H2512 Age-related nuclear cataract, left eye: Secondary | ICD-10-CM | POA: Diagnosis not present

## 2017-07-09 ENCOUNTER — Other Ambulatory Visit (INDEPENDENT_AMBULATORY_CARE_PROVIDER_SITE_OTHER): Payer: Self-pay | Admitting: Orthopaedic Surgery

## 2017-07-09 DIAGNOSIS — M5020 Other cervical disc displacement, unspecified cervical region: Secondary | ICD-10-CM

## 2017-07-09 NOTE — Telephone Encounter (Signed)
Please advise 

## 2017-08-13 ENCOUNTER — Ambulatory Visit (INDEPENDENT_AMBULATORY_CARE_PROVIDER_SITE_OTHER): Payer: Medicare Other

## 2017-08-13 ENCOUNTER — Ambulatory Visit (INDEPENDENT_AMBULATORY_CARE_PROVIDER_SITE_OTHER): Payer: Medicare Other | Admitting: Physical Medicine and Rehabilitation

## 2017-08-13 ENCOUNTER — Encounter (INDEPENDENT_AMBULATORY_CARE_PROVIDER_SITE_OTHER): Payer: Self-pay | Admitting: Physical Medicine and Rehabilitation

## 2017-08-13 DIAGNOSIS — M25512 Pain in left shoulder: Secondary | ICD-10-CM

## 2017-08-13 DIAGNOSIS — G8929 Other chronic pain: Secondary | ICD-10-CM | POA: Diagnosis not present

## 2017-08-13 MED ORDER — BUPIVACAINE HCL 0.5 % IJ SOLN
3.0000 mL | INTRAMUSCULAR | Status: AC | PRN
  Administered 2017-08-13: 3 mL via INTRA_ARTICULAR

## 2017-08-13 MED ORDER — TRIAMCINOLONE ACETONIDE 40 MG/ML IJ SUSP
80.0000 mg | INTRAMUSCULAR | Status: AC | PRN
  Administered 2017-08-13: 80 mg via INTRA_ARTICULAR

## 2017-08-13 NOTE — Progress Notes (Signed)
Brooke Cantrell - 71 y.o. female MRN [id number]  Date of birth: 12/04/1945  Office Visit Note: Visit Date: 08/13/2017 PCP: Patient, No Pcp Per Referred by: No ref. provider found  Subjective: Chief Complaint  Patient presents with  . Left Shoulder - Pain   HPI: Brooke Cantrell is a 71 year old right-hand-dominant with worsening left shoulder pain.  She has known left arthritis of the glenohumeral joint.  She is also had issues with her cervical spine.  Diagnostic and therapeutic anesthetic arthrogram of the glenohumeral joint on the left she is getting return of similar complaints with pain control range of motion of the shoulder.  Left shoulder injection.  Follow-up with Dr. Ninfa Linden.  They are moving to [place] for the next 4 months.     ROS Otherwise per HPI.  Assessment & Plan: Visit Diagnoses:  1. Chronic left shoulder pain     Plan: No additional findings.   Meds & Orders: No orders of the defined types were placed in this encounter.   Orders Placed This Encounter  Procedures  . Large Joint Inj: L glenohumeral  . XR C-ARM NO REPORT    Follow-up: Return if symptoms worsen or fail to improve, for Dr. Ninfa Linden.   Procedures: Large Joint Inj: L glenohumeral on 08/13/2017 11:10 AM Indications: pain and diagnostic evaluation Details: 22 G 3.5 in needle, fluoroscopy-guided anteromedial approach  Arthrogram: No  Medications: 80 mg triamcinolone acetonide 40 MG/ML; 3 mL bupivacaine 0.5 % Outcome: tolerated well, no immediate complications  There was excellent flow of contrast producing a partial arthrogram of the glenohumeral joint. The patient did have relief of symptoms during the anesthetic phase of the injection. Procedure, treatment alternatives, risks and benefits explained, specific risks discussed. Consent was given by the patient. Immediately prior to procedure a time out was called to verify the correct patient, procedure, equipment, support staff and site/side marked as  required. Patient was prepped and draped in the usual sterile fashion.      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 Effacement of the ventral subarachnoid space with contact of the right side of the cord. Encroachment upon the proximal foramen on the right could affect at least the right C6 nerve root.  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  Spondylosis at C4-5 with bilateral foraminal narrowing of a mild-to-moderate degree.  She reports that she quit smoking about 20 years ago. she has never used smokeless tobacco. No results for input(s): HGBA1C, LABURIC in the last 8760 hours.  Objective:  VS:  HT:    WT:   BMI:     BP:   HR: bpm  TEMP: ( )  RESP:  Physical Exam  Musculoskeletal:  Decreased range of motion of the left shoulder with painful range of motion.    Ortho Exam Imaging: Xr C-arm No Report  Result Date: 08/13/2017 Please see Notes or Procedures tab for imaging impression.   Past Medical/Family/Surgical/Social History: Medications & Allergies reviewed per EMR Patient Active Problem List   Diagnosis Date Noted  . Chronic left shoulder pain 05/17/2017  . 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 Chronic infection of sinus 08/31/2015  . Cough 08/31/2015  . Allergic reaction 08/31/2015   Past Medical History:  Diagnosis Date  . Breast cancer ([place])    Family History  Problem Relation Age of Onset  .  Allergic rhinitis Father   . Allergic rhinitis Sister   . Allergic rhinitis Brother    Past Surgical History:  Procedure Laterality Date  . breast cancer surgery    . BREAST ENHANCEMENT SURGERY    . ECTOPIC PREGNANCY SURGERY    . INNER EAR SURGERY    . TONSILLECTOMY    . TUBAL LIGATION     Social History   Occupational History  . Not on file  Tobacco Use  . Smoking  status: Former Smoker    Last attempt to quit: 09/11/1996    Years since quitting: 20.9  . Smokeless tobacco: Never Used  Substance and Sexual Activity  . Alcohol use: Not on file  . Drug use: Not on file  . Sexual activity: Not on file

## 2017-08-13 NOTE — Patient Instructions (Signed)

## 2017-11-23 DIAGNOSIS — J069 Acute upper respiratory infection, unspecified: Secondary | ICD-10-CM | POA: Diagnosis not present

## 2018-01-03 ENCOUNTER — Telehealth (INDEPENDENT_AMBULATORY_CARE_PROVIDER_SITE_OTHER): Payer: Self-pay | Admitting: Physical Medicine and Rehabilitation

## 2018-01-03 NOTE — Telephone Encounter (Signed)
Please let her know that after cervical injection she saw Dr. Ninfa Linden and then had two shoulder injections so she can decide which helped, may need to see her in [place]

## 2018-01-04 NOTE — Telephone Encounter (Signed)
Patient states that she has been doing well, but she "did something" while she was in [place] and she would like a neck injection. I scheduled her for an office visit on 5/2 to discuss.

## 2018-01-07 DIAGNOSIS — H43393 Other vitreous opacities, bilateral: Secondary | ICD-10-CM | POA: Diagnosis not present

## 2018-01-07 DIAGNOSIS — H26492 Other secondary cataract, left eye: Secondary | ICD-10-CM | POA: Diagnosis not present

## 2018-01-07 DIAGNOSIS — Z961 Presence of intraocular lens: Secondary | ICD-10-CM | POA: Diagnosis not present

## 2018-01-07 DIAGNOSIS — H26493 Other secondary cataract, bilateral: Secondary | ICD-10-CM | POA: Diagnosis not present

## 2018-01-10 ENCOUNTER — Ambulatory Visit (INDEPENDENT_AMBULATORY_CARE_PROVIDER_SITE_OTHER): Payer: Medicare Other | Admitting: Physical Medicine and Rehabilitation

## 2018-01-25 DIAGNOSIS — Z0000 Encounter for general adult medical examination without abnormal findings: Secondary | ICD-10-CM | POA: Diagnosis not present

## 2018-01-25 DIAGNOSIS — R7303 Prediabetes: Secondary | ICD-10-CM | POA: Diagnosis not present

## 2018-01-25 DIAGNOSIS — Z136 Encounter for screening for cardiovascular disorders: Secondary | ICD-10-CM | POA: Diagnosis not present

## 2018-01-25 DIAGNOSIS — Z1331 Encounter for screening for depression: Secondary | ICD-10-CM | POA: Diagnosis not present

## 2018-01-25 DIAGNOSIS — Z1231 Encounter for screening mammogram for malignant neoplasm of breast: Secondary | ICD-10-CM | POA: Diagnosis not present

## 2018-01-25 DIAGNOSIS — Z139 Encounter for screening, unspecified: Secondary | ICD-10-CM | POA: Diagnosis not present

## 2018-01-25 DIAGNOSIS — Z9181 History of falling: Secondary | ICD-10-CM | POA: Diagnosis not present

## 2018-01-28 ENCOUNTER — Other Ambulatory Visit: Payer: Self-pay | Admitting: Nurse Practitioner

## 2018-01-28 DIAGNOSIS — H26491 Other secondary cataract, right eye: Secondary | ICD-10-CM | POA: Diagnosis not present

## 2018-01-28 DIAGNOSIS — Z961 Presence of intraocular lens: Secondary | ICD-10-CM | POA: Diagnosis not present

## 2018-01-28 DIAGNOSIS — H43813 Vitreous degeneration, bilateral: Secondary | ICD-10-CM | POA: Diagnosis not present

## 2018-01-28 DIAGNOSIS — Z139 Encounter for screening, unspecified: Secondary | ICD-10-CM

## 2018-01-28 DIAGNOSIS — E2839 Other primary ovarian failure: Secondary | ICD-10-CM

## 2018-01-28 DIAGNOSIS — H43393 Other vitreous opacities, bilateral: Secondary | ICD-10-CM | POA: Diagnosis not present

## 2018-01-30 DIAGNOSIS — M8580 Other specified disorders of bone density and structure, unspecified site: Secondary | ICD-10-CM | POA: Diagnosis not present

## 2018-01-30 DIAGNOSIS — E663 Overweight: Secondary | ICD-10-CM | POA: Diagnosis not present

## 2018-01-30 DIAGNOSIS — Z6828 Body mass index (BMI) 28.0-28.9, adult: Secondary | ICD-10-CM | POA: Diagnosis not present

## 2018-01-30 DIAGNOSIS — N390 Urinary tract infection, site not specified: Secondary | ICD-10-CM | POA: Diagnosis not present

## 2018-02-13 ENCOUNTER — Ambulatory Visit (INDEPENDENT_AMBULATORY_CARE_PROVIDER_SITE_OTHER): Payer: Medicare Other | Admitting: Orthopaedic Surgery

## 2018-02-13 ENCOUNTER — Encounter (INDEPENDENT_AMBULATORY_CARE_PROVIDER_SITE_OTHER): Payer: Self-pay | Admitting: Orthopaedic Surgery

## 2018-02-13 ENCOUNTER — Ambulatory Visit (INDEPENDENT_AMBULATORY_CARE_PROVIDER_SITE_OTHER): Payer: Medicare Other

## 2018-02-13 DIAGNOSIS — G8929 Other chronic pain: Secondary | ICD-10-CM | POA: Diagnosis not present

## 2018-02-13 DIAGNOSIS — M25512 Pain in left shoulder: Secondary | ICD-10-CM | POA: Diagnosis not present

## 2018-02-13 DIAGNOSIS — M19012 Primary osteoarthritis, left shoulder: Secondary | ICD-10-CM

## 2018-02-13 NOTE — Progress Notes (Signed)
Office Visit Note   Patient: Brooke Cantrell           Date of Birth: November 01, 1945           MRN: [id number] Visit Date: 02/13/2018              Requested by: No referring provider defined for this encounter. PCP: Patient, No Pcp Per   Assessment & Plan: Visit Diagnoses:  1. Chronic left shoulder pain   2. Primary osteoarthritis of left shoulder     Plan: The patient understands that she has significant left shoulder arthritis.  This is slowly worsened over 2 years now to the point it is detrimentally affecting her active daily living and her quality of life.  She is interested in talking to a shoulder specialist about the possibility of shoulder replacement surgery.  We will work on getting her an appointment with my partner Dr. Marlou Sa.  Follow-Up Instructions: Return in about 2 weeks (around 02/27/2018).   Orders:  Orders Placed This Encounter  Procedures  . XR Shoulder Left   No orders of the defined types were placed in this encounter.     Procedures: No procedures performed   Clinical Data: No additional findings.   Subjective: Chief Complaint  Patient presents with  . Left Shoulder - Pain  The patient is a very pleasant 72 year old female well-known to me.  She has slowly worsening left shoulder pain.  I saw her for the same thing 2 years ago and I told her at that time she had glenohumeral arthritis of the left shoulder and may end up needing a shoulder replacement.  She is done well conservatively with injections and time.  However at this point though her pain is starting to affect her mobility and her quality of life detrimentally.  She is wanting to consider shoulder replacement surgery.  HPI  Review of Systems She currently denies any headache, chest pain, shortness of breath, fever, chills, nausea, vomiting.  She is not a diabetic and not a smoker.  Objective: Vital Signs: There were no vitals taken for this visit.  Physical Exam She is alert and oriented  x3 and in no acute distress Ortho Exam Examination of her left shoulder shows grinding at the glenohumeral joint.  Her internal rotation with adduction is limited as is her flexion abduction.  She does show integrity of the rotator cuff. Specialty Comments:  MRI on 07/11/2016: Cervical spine: C5/C6 large extruded fragment migrating caudally to the right of midline. Effacement of ventral subarachnoid space without cord change. Additional left-sided spondylosis C3-4 & C4-5 foraminal narrowing.  Imaging: Xr Shoulder Left  Result Date: 02/13/2018 3 views of left shoulder show significant glenohumeral arthritis that is end-stage.  The shoulder is well located in the humeral head is not high riding.    PMFS History: Patient Active Problem List   Diagnosis Date Noted  . Primary osteoarthritis of 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  . Chronic left shoulder pain 05/17/2017  . 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 Chronic infection of sinus 08/31/2015  . Cough 08/31/2015  . Allergic reaction 08/31/2015   Past Medical History:  Diagnosis Date  . Breast cancer ([place])     Family History  Problem Relation Age of Onset  . Allergic rhinitis Father   . Allergic rhinitis Sister   . Allergic rhinitis Brother     Past Surgical History:  Procedure Laterality Date  . breast cancer surgery    . BREAST ENHANCEMENT SURGERY    .  ECTOPIC PREGNANCY SURGERY    . INNER EAR SURGERY    . TONSILLECTOMY    . TUBAL LIGATION     Social History   Occupational History  . Not on file  Tobacco Use  . Smoking status: Former Smoker    Last attempt to quit: 09/11/1996    Years since quitting: 21.4  . Smokeless tobacco: Never Used  Substance and Sexual Activity  . Alcohol use: Not on file  . Drug use: Not on file  . Sexual activity: Not on file

## 2018-02-27 ENCOUNTER — Encounter (INDEPENDENT_AMBULATORY_CARE_PROVIDER_SITE_OTHER): Payer: Self-pay | Admitting: Orthopedic Surgery

## 2018-02-27 ENCOUNTER — Ambulatory Visit (INDEPENDENT_AMBULATORY_CARE_PROVIDER_SITE_OTHER): Payer: Medicare Other | Admitting: Orthopedic Surgery

## 2018-02-27 ENCOUNTER — Ambulatory Visit
Admission: RE | Admit: 2018-02-27 | Discharge: 2018-02-27 | Disposition: A | Discharge status: Home / Self Care | Payer: Medicare Other | Source: Ambulatory Visit | Attending: Orthopedic Surgery | Admitting: Orthopedic Surgery

## 2018-02-27 DIAGNOSIS — M19012 Primary osteoarthritis, left shoulder: Secondary | ICD-10-CM

## 2018-02-27 DIAGNOSIS — M25512 Pain in left shoulder: Secondary | ICD-10-CM | POA: Diagnosis not present

## 2018-02-27 NOTE — Progress Notes (Signed)
Office Visit Note   Patient: Brooke Cantrell           Date of Birth: 1945-10-18           MRN: [id number] Visit Date: 02/27/2018 Requested by: No referring provider defined for this encounter. PCP: Patient, No Pcp Per  Subjective: Chief Complaint  Patient presents with  . Left Shoulder - Pain  Brooke Cantrell is a patient who is now several months out from left shoulder injection for glenohumeral arthritis.  She is had chronic pain in the shoulder for years.  Left shoulder is worse.  She is right-hand dominant.  Radiographs do show glenohumeral arthritis with maintenance of the acromiohumeral distance.  Has not had any surgery on that left shoulder.  She has been to therapy.  The pain does wake her from sleep at night and occurs on a daily basis.  She takes Neurontin for her symptoms which helps some.  She does have a history of breast cancer but is otherwise generally healthy and intact.  She is here with her husband today.  HPI: See above              ROS: All systems reviewed are negative as they relate to the chief complaint within the history of present illness.  Patient denies  fevers or chills. Impression is left shoulder pain with arthritis.  Plan after extensive discussion about the risk and benefits of operative and nonoperative treatment would be shoulder replacement.  Assessment & Plan: Visit Diagnoses:  1. Primary osteoarthritis of left shoulder     Plan: Her rotator cuff seems to be intact.  I think she would be a candidate for total shoulder replacement versus reverse shoulder replacement depending on the status of the rotator cuff.  The risks and benefits are discussed including but not limited to infection nerve vessel damage implant longevity and potential need for more surgery.  Plan to use CT scanning preoperatively for patient specific instrumentation and guidance.  Patient understands the risk and benefits and wishes to proceed.  All questions answered  Follow-Up Instructions:  No follow-ups on file.   Orders:  Orders Placed This Encounter  Procedures  . CT SHOULDER LEFT WO CONTRAST   No orders of the defined types were placed in this encounter.     Procedures: No procedures performed   Clinical Data: No additional findings.  Objective: Vital Signs: There were no vitals taken for this visit.  Physical Exam:   Constitutional: Patient appears well-developed HEENT:  Head: Normocephalic Eyes:EOM are normal Neck: Normal range of motion Cardiovascular: Normal rate Pulmonary/chest: Effort normal Neurologic: Patient is alert Skin: Skin is warm Psychiatric: Patient has normal mood and affect    Ortho Exam: Orthopedic exam demonstrates good cervical spine range of motion.  Patient has external rotation at 15 degrees of abduction to about 45 on the left and 55 on the right.  Cuff strength is intact infraspinatus supraspinatus and subscap muscle testing.  Patient has fairly significant pain and grinding with abduction and forward flexion which does not go much past 90 degrees.  No other masses lymph adenopathy or skin changes noted in that shoulder girdle region.  Deltoid is functional and fires.  Radial pulse is intact on the left-hand side.  Specialty Comments:  MRI on 07/11/2016: Cervical spine: C5/C6 large extruded fragment migrating caudally to the right of midline. Effacement of ventral subarachnoid space without cord change. Additional left-sided spondylosis C3-4 & C4-5 foraminal narrowing.  Imaging: No results found.  PMFS History: Patient Active Problem List   Diagnosis Date Noted  . Primary osteoarthritis of 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  . Chronic left shoulder pain 05/17/2017  . 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 Chronic infection of sinus 08/31/2015  . Cough 08/31/2015  . Allergic reaction 08/31/2015   Past Medical History:  Diagnosis Date  . Breast cancer ([place])     Family History  Problem Relation Age of Onset    . Allergic rhinitis Father   . Allergic rhinitis Sister   . Allergic rhinitis Brother     Past Surgical History:  Procedure Laterality Date  . breast cancer surgery    . BREAST ENHANCEMENT SURGERY    . ECTOPIC PREGNANCY SURGERY    . INNER EAR SURGERY    . TONSILLECTOMY    . TUBAL LIGATION     Social History   Occupational History  . Not on file  Tobacco Use  . Smoking status: Former Smoker    Last attempt to quit: 09/11/1996    Years since quitting: 21.4  . Smokeless tobacco: Never Used  Substance and Sexual Activity  . Alcohol use: Not on file  . Drug use: Not on file  . Sexual activity: Not on file

## 2018-03-04 ENCOUNTER — Other Ambulatory Visit (INDEPENDENT_AMBULATORY_CARE_PROVIDER_SITE_OTHER): Payer: Self-pay

## 2018-03-06 ENCOUNTER — Ambulatory Visit (INDEPENDENT_AMBULATORY_CARE_PROVIDER_SITE_OTHER): Payer: Medicare Other | Admitting: Orthopedic Surgery

## 2018-03-21 ENCOUNTER — Ambulatory Visit
Admission: RE | Admit: 2018-03-21 | Discharge: 2018-03-21 | Disposition: A | Discharge status: Home / Self Care | Payer: Medicare Other | Source: Ambulatory Visit | Attending: Nurse Practitioner | Admitting: Nurse Practitioner

## 2018-03-21 DIAGNOSIS — Z1231 Encounter for screening mammogram for malignant neoplasm of breast: Secondary | ICD-10-CM | POA: Diagnosis not present

## 2018-03-21 DIAGNOSIS — E2839 Other primary ovarian failure: Secondary | ICD-10-CM

## 2018-03-21 DIAGNOSIS — Z780 Asymptomatic menopausal state: Secondary | ICD-10-CM | POA: Diagnosis not present

## 2018-03-21 DIAGNOSIS — M8589 Other specified disorders of bone density and structure, multiple sites: Secondary | ICD-10-CM | POA: Diagnosis not present

## 2018-03-21 DIAGNOSIS — Z139 Encounter for screening, unspecified: Secondary | ICD-10-CM

## 2018-04-03 ENCOUNTER — Other Ambulatory Visit (INDEPENDENT_AMBULATORY_CARE_PROVIDER_SITE_OTHER): Payer: Self-pay | Admitting: Orthopedic Surgery

## 2018-04-03 DIAGNOSIS — M19012 Primary osteoarthritis, left shoulder: Secondary | ICD-10-CM

## 2018-04-11 NOTE — Pre-Procedure Instructions (Signed)
Brooke Cantrell  04/11/2018      [facility] [place] [address] [place] [address] Phone: [phone] Fax: [phone]  [id number] [place], [place] [address] Phone: [phone] Fax: [phone]    Your procedure is scheduled on April 25, 2018  Report to [facility] at [place].M.  Call this number if you have problems the morning of surgery:  [phone]   Remember:  Do not eat or drink after midnight.    Take these medicines the morning of surgery with A SIP OF WATER: Gabapentin (Neurontin) Montelukast (Singulair)  7 days prior to surgery STOP taking any Aspirin(unless otherwise instructed by your surgeon), Aleve, Naproxen, Ibuprofen, Motrin, Advil, Goody's, BC's, all herbal medications, fish oil, and all vitamins     Do not wear jewelry, make-up or nail polish.  Do not wear lotions, powders, or perfumes, or deodorant.  Do not shave 48 hours prior to surgery.  Do not bring valuables to the hospital.  [facility] is not responsible for any belongings or valuables.  Contacts, dentures or bridgework may not be worn into surgery.  Leave your suitcase in the car.  After surgery it may be brought to your room.  For patients admitted to the hospital, discharge time will be determined by your treatment team.  Patients discharged the day of surgery will not be allowed to drive home.    [facility]- Preparing For Surgery  Before surgery, you can play an important role. Because skin is not sterile, your skin needs to be as free of germs as possible. You can reduce the number of germs on your skin by washing with CHG (chlorahexidine gluconate) Soap before surgery.  CHG is an antiseptic cleaner which kills germs and bonds with the skin to continue killing germs even after washing.    Oral Hygiene is also important  to reduce your risk of infection.  Remember - BRUSH YOUR TEETH THE MORNING OF SURGERY WITH YOUR REGULAR TOOTHPASTE  Please do not use if you have an allergy to CHG or antibacterial soaps. If your skin becomes reddened/irritated stop using the CHG.  Do not shave (including legs and underarms) for at least 48 hours prior to first CHG shower. It is OK to shave your face.  Please follow these instructions carefully.   1. Shower the NIGHT BEFORE SURGERY and the MORNING OF SURGERY with CHG.   2. If you chose to wash your hair, wash your hair first as usual with your normal shampoo.  3. After you shampoo, rinse your hair and body thoroughly to remove the shampoo.  4. Use CHG as you would any other liquid soap. You can apply CHG directly to the skin and wash gently with a scrungie or a clean washcloth.   5. Apply the CHG Soap to your body ONLY FROM THE NECK DOWN.  Do not use on open wounds or open sores. Avoid contact with your eyes, ears, mouth and genitals (private parts). Wash Face and genitals (private parts)  with your normal soap.  6. Wash thoroughly, paying special attention to the area where your surgery will be performed.  7. Thoroughly rinse your body with warm water from the neck down.  8. DO NOT shower/wash with your normal soap after using and rinsing off the CHG Soap.  9. Pat yourself dry with  a CLEAN TOWEL.  10. Wear CLEAN PAJAMAS to bed the night before surgery, wear comfortable clothes the morning of surgery  11. Place CLEAN SHEETS on your bed the night of your first shower and DO NOT SLEEP WITH PETS.    Day of Surgery:  Do not apply any deodorants/lotions.  Please wear clean clothes to the hospital/surgery center.   Remember to brush your teeth WITH YOUR REGULAR TOOTHPASTE.

## 2018-04-12 ENCOUNTER — Encounter (HOSPITAL_COMMUNITY): Payer: Self-pay

## 2018-04-12 ENCOUNTER — Encounter (HOSPITAL_COMMUNITY)
Admission: RE | Admit: 2018-04-12 | Discharge: 2018-04-12 | Disposition: A | Discharge status: Home / Self Care | Payer: Medicare Other | Source: Ambulatory Visit | Attending: Orthopedic Surgery | Admitting: Orthopedic Surgery

## 2018-04-12 ENCOUNTER — Other Ambulatory Visit: Payer: Self-pay

## 2018-04-12 DIAGNOSIS — G8929 Other chronic pain: Secondary | ICD-10-CM | POA: Diagnosis not present

## 2018-04-12 DIAGNOSIS — M19012 Primary osteoarthritis, left shoulder: Secondary | ICD-10-CM | POA: Insufficient documentation

## 2018-04-12 DIAGNOSIS — Z87891 Personal history of nicotine dependence: Secondary | ICD-10-CM | POA: Insufficient documentation

## 2018-04-12 DIAGNOSIS — Z853 Personal history of malignant neoplasm of breast: Secondary | ICD-10-CM | POA: Diagnosis not present

## 2018-04-12 DIAGNOSIS — Z87442 Personal history of urinary calculi: Secondary | ICD-10-CM | POA: Diagnosis not present

## 2018-04-12 DIAGNOSIS — Z01812 Encounter for preprocedural laboratory examination: Secondary | ICD-10-CM | POA: Diagnosis not present

## 2018-04-12 DIAGNOSIS — Z79899 Other long term (current) drug therapy: Secondary | ICD-10-CM | POA: Insufficient documentation

## 2018-04-12 HISTORY — DX: Personal history of urinary calculi: Z87.442

## 2018-04-12 HISTORY — DX: Depression, unspecified: F32.A

## 2018-04-12 HISTORY — DX: Major depressive disorder, single episode, unspecified: F32.9

## 2018-04-12 HISTORY — DX: Unspecified osteoarthritis, unspecified site: M19.90

## 2018-04-12 LAB — URINALYSIS, ROUTINE W REFLEX MICROSCOPIC
Bilirubin Urine: NEGATIVE
Glucose, UA: NEGATIVE mg/dL
HGB URINE DIPSTICK: NEGATIVE
KETONES UR: NEGATIVE mg/dL
Leukocytes, UA: NEGATIVE
Nitrite: NEGATIVE
Protein, ur: NEGATIVE mg/dL
SPECIFIC GRAVITY, URINE: 1.017 (ref 1.005–1.030)
pH: 8 (ref 5.0–8.0)

## 2018-04-12 LAB — CBC
HEMATOCRIT: 43.3 % (ref 36.0–46.0)
Hemoglobin: 13.8 g/dL (ref 12.0–15.0)
MCH: 30.5 pg (ref 26.0–34.0)
MCHC: 31.9 g/dL (ref 30.0–36.0)
MCV: 95.8 fL (ref 78.0–100.0)
PLATELETS: 266 10*3/uL (ref 150–400)
RBC: 4.52 MIL/uL (ref 3.87–5.11)
RDW: 12.5 % (ref 11.5–15.5)
WBC: 9.3 10*3/uL (ref 4.0–10.5)

## 2018-04-12 LAB — BASIC METABOLIC PANEL
ANION GAP: 9 (ref 5–15)
BUN: 16 mg/dL (ref 8–23)
CHLORIDE: 104 mmol/L (ref 98–111)
CO2: 26 mmol/L (ref 22–32)
Calcium: 8.8 mg/dL — ABNORMAL LOW (ref 8.9–10.3)
Creatinine, Ser: 0.77 mg/dL (ref 0.44–1.00)
GFR calc non Af Amer: >60 mL/min (ref >60)
Glucose, Bld: 98 mg/dL (ref 70–99)
Potassium: 4 mmol/L (ref 3.5–5.1)
SODIUM: 139 mmol/L (ref 135–145)

## 2018-04-12 LAB — SURGICAL PCR SCREEN
MRSA, PCR: NEGATIVE
Staphylococcus aureus: NEGATIVE

## 2018-04-12 LAB — TYPE AND SCREEN
ABO/RH(D): B POS
ANTIBODY SCREEN: NEGATIVE

## 2018-04-12 LAB — ABO/RH: ABO/RH(D): B POS

## 2018-04-12 LAB — PROTIME-INR
INR: 0.99
Prothrombin Time: 13 seconds (ref 11.4–15.2)

## 2018-04-12 NOTE — Progress Notes (Signed)
PCP: Denies, reports she is "inbetween doctors". Saw a NP for physical this year at [facility], but does not remember name.  Cardiologist: denies   EKG: denies CXR: denies ECHO: Jan 2007--before chemo Stress Test: denies Cardiac Cath: denies  Pt will attempt to attend joint class today.   Patient denies shortness of breath, fever, cough, and chest pain at PAT appointment.  Patient verbalized understanding of instructions provided today at the PAT appointment.  Patient asked to review instructions at home and day of surgery.

## 2018-04-15 LAB — URINE CULTURE: Culture: >=100000 — AB

## 2018-04-23 ENCOUNTER — Other Ambulatory Visit: Payer: Self-pay

## 2018-04-23 ENCOUNTER — Other Ambulatory Visit (INDEPENDENT_AMBULATORY_CARE_PROVIDER_SITE_OTHER): Payer: Self-pay | Admitting: Orthopedic Surgery

## 2018-04-23 MED ORDER — FOSFOMYCIN TROMETHAMINE 3 G PO PACK: 3.0000 g | PACK | Freq: Once | ORAL | 0 refills | Status: AC

## 2018-04-23 NOTE — Telephone Encounter (Signed)
Patient called asked if [facility] [address] [facility] [place] instead of her picking it up. Patient said she is allergic to Cipro. Patient asked for a call when Rx is called in. The number to contact patient is [phone]

## 2018-04-23 NOTE — Progress Notes (Signed)
Needs Cipro 500 mg a day for 3 days prior to surgery thanks

## 2018-04-23 NOTE — Telephone Encounter (Signed)
Tried to reach patient. Line busy. Will try again.

## 2018-04-23 NOTE — Telephone Encounter (Signed)
Fsofomycin 3g sent to pharmacy.

## 2018-04-23 NOTE — Telephone Encounter (Signed)
Patient called back asked if she should cancel her surgery due to the infection and also want to know many days she will be in the hospital

## 2018-04-23 NOTE — Telephone Encounter (Signed)
I called and spoke with patient. She is not having burning with urination, but states she is prone to UTI's and is having frequent urination which is the usual start to one. I advised we would send in antibiotic per Dr. Randel Pigg instructions.  Per Dr Marlou Sa he talked with pharmacist and unless patient is having UTI symptoms, she does not need antibiotic. If she is symptomatic since she has an allergy to Cipro, we can call in a one time dose of Fosamysin 3g to take once orally.   She also asked how long she would be in the hospital. I informed her 2 days per the posting sheet. She needs to know if that is 2 days or 2 nights.  If it is 2 nights, you do not need to return call to patient.  If it is 2 days and only 1 night, she would appreciate you calling to let her know.

## 2018-04-24 NOTE — Telephone Encounter (Signed)
I s/w patient advised per Dr Marlou Sa unsure if it will be 1 or 2 nights. Depends on how surgery goes and how well patient does with recovery. She verbalized understanding.

## 2018-04-24 NOTE — Anesthesia Preprocedure Evaluation (Addendum)
Anesthesia Evaluation  Patient identified by MRN, date of birth, ID band Patient awake    Reviewed: Allergy & Precautions, NPO status , Patient's Chart, lab work & pertinent test results  Airway Mallampati: II  TM Distance: >3 FB Neck ROM: Full    Dental  (+) Dental Advisory Given   Pulmonary former smoker,    breath sounds clear to auscultation       Cardiovascular negative cardio ROS   Rhythm:Regular Rate:Normal     Neuro/Psych negative neurological ROS     GI/Hepatic negative GI ROS, Neg liver ROS,   Endo/Other  negative endocrine ROS  Renal/GU negative Renal ROS     Musculoskeletal  (+) Arthritis ,   Abdominal   Peds  Hematology negative hematology ROS (+)   Anesthesia Other Findings   Reproductive/Obstetrics                            Lab Results  Component Value Date   WBC 9.3 04/12/2018   HGB 13.8 04/12/2018   HCT 43.3 04/12/2018   MCV 95.8 04/12/2018   PLT 266 04/12/2018   Lab Results  Component Value Date   CREATININE 0.77 04/12/2018   BUN 16 04/12/2018   NA 139 04/12/2018   K 4.0 04/12/2018   CL 104 04/12/2018   CO2 26 04/12/2018    Anesthesia Physical Anesthesia Plan  ASA: II  Anesthesia Plan: General   Post-op Pain Management: GA combined w/ Regional for post-op pain   Induction: Intravenous  PONV Risk Score and Plan: 3 and Dexamethasone, Ondansetron, Midazolam and Treatment may vary due to age or medical condition  Airway Management Planned: Oral ETT  Additional Equipment:   Intra-op Plan:   Post-operative Plan: Extubation in OR  Informed Consent: I have reviewed the patients History and Physical, chart, labs and discussed the procedure including the risks, benefits and alternatives for the proposed anesthesia with the patient or authorized representative who has indicated his/her understanding and acceptance.   Dental advisory given  Plan  Discussed with: CRNA  Anesthesia Plan Comments:        Anesthesia Quick Evaluation

## 2018-04-25 ENCOUNTER — Encounter (HOSPITAL_COMMUNITY): Payer: Self-pay | Admitting: Surgery

## 2018-04-25 ENCOUNTER — Encounter (HOSPITAL_COMMUNITY)
Admission: RE | Disposition: A | Discharge status: Home / Self Care | Payer: Self-pay | Source: Ambulatory Visit | Attending: Orthopedic Surgery

## 2018-04-25 ENCOUNTER — Inpatient Hospital Stay (HOSPITAL_COMMUNITY)
Admission: RE | Admit: 2018-04-25 | Discharge: 2018-04-26 | DRG: 483 | Disposition: A | Discharge status: Home / Self Care | Payer: Medicare Other | Source: Ambulatory Visit | Attending: Orthopedic Surgery | Admitting: Orthopedic Surgery

## 2018-04-25 ENCOUNTER — Inpatient Hospital Stay (HOSPITAL_COMMUNITY): Payer: Medicare Other | Admitting: Anesthesiology

## 2018-04-25 ENCOUNTER — Other Ambulatory Visit: Payer: Self-pay

## 2018-04-25 ENCOUNTER — Inpatient Hospital Stay (HOSPITAL_COMMUNITY): Payer: Medicare Other

## 2018-04-25 DIAGNOSIS — Z853 Personal history of malignant neoplasm of breast: Secondary | ICD-10-CM

## 2018-04-25 DIAGNOSIS — Z881 Allergy status to other antibiotic agents status: Secondary | ICD-10-CM | POA: Diagnosis not present

## 2018-04-25 DIAGNOSIS — Z87891 Personal history of nicotine dependence: Secondary | ICD-10-CM | POA: Diagnosis not present

## 2018-04-25 DIAGNOSIS — Z9842 Cataract extraction status, left eye: Secondary | ICD-10-CM | POA: Diagnosis not present

## 2018-04-25 DIAGNOSIS — M25512 Pain in left shoulder: Secondary | ICD-10-CM | POA: Diagnosis not present

## 2018-04-25 DIAGNOSIS — M19012 Primary osteoarthritis, left shoulder: Secondary | ICD-10-CM

## 2018-04-25 DIAGNOSIS — Z87442 Personal history of urinary calculi: Secondary | ICD-10-CM | POA: Diagnosis not present

## 2018-04-25 DIAGNOSIS — Z96612 Presence of left artificial shoulder joint: Secondary | ICD-10-CM | POA: Diagnosis not present

## 2018-04-25 DIAGNOSIS — Z9841 Cataract extraction status, right eye: Secondary | ICD-10-CM

## 2018-04-25 DIAGNOSIS — J329 Chronic sinusitis, unspecified: Secondary | ICD-10-CM | POA: Diagnosis not present

## 2018-04-25 DIAGNOSIS — G8918 Other acute postprocedural pain: Secondary | ICD-10-CM | POA: Diagnosis not present

## 2018-04-25 DIAGNOSIS — Z471 Aftercare following joint replacement surgery: Secondary | ICD-10-CM | POA: Diagnosis not present

## 2018-04-25 DIAGNOSIS — M19019 Primary osteoarthritis, unspecified shoulder: Secondary | ICD-10-CM

## 2018-04-25 HISTORY — PX: TOTAL SHOULDER ARTHROPLASTY: SHX126

## 2018-04-25 SURGERY — ARTHROPLASTY, SHOULDER, TOTAL
Anesthesia: General | Site: Shoulder | Laterality: Left

## 2018-04-25 MED ORDER — SUGAMMADEX SODIUM 200 MG/2ML IV SOLN
INTRAVENOUS | Status: DC | PRN
  Administered 2018-04-25: 150 mg via INTRAVENOUS

## 2018-04-25 MED ORDER — FENTANYL CITRATE (PF) 250 MCG/5ML IJ SOLN
INTRAMUSCULAR | Status: DC | PRN
  Administered 2018-04-25: 50 ug via INTRAVENOUS
  Administered 2018-04-25 (×2): 25 ug via INTRAVENOUS

## 2018-04-25 MED ORDER — PROPOFOL 10 MG/ML IV BOLUS
INTRAVENOUS | Status: AC
  Filled 2018-04-25: qty 40

## 2018-04-25 MED ORDER — METHOCARBAMOL 1000 MG/10ML IJ SOLN
500.0000 mg | Freq: Four times a day (QID) | INTRAVENOUS | Status: DC | PRN
  Filled 2018-04-25: qty 5

## 2018-04-25 MED ORDER — GABAPENTIN 300 MG PO CAPS
600.0000 mg | ORAL_CAPSULE | Freq: Every day | ORAL | Status: DC
  Administered 2018-04-25: 600 mg via ORAL
  Filled 2018-04-25: qty 2

## 2018-04-25 MED ORDER — SODIUM CHLORIDE 0.9 % IV SOLN
INTRAVENOUS | Status: DC | PRN
  Administered 2018-04-25: 25 ug/min via INTRAVENOUS

## 2018-04-25 MED ORDER — CHLORHEXIDINE GLUCONATE 4 % EX LIQD: 60.0000 mL | Freq: Once | CUTANEOUS | Status: DC

## 2018-04-25 MED ORDER — BUPIVACAINE LIPOSOME 1.3 % IJ SUSP
INTRAMUSCULAR | Status: DC | PRN
  Administered 2018-04-25: 10 mL via PERINEURAL

## 2018-04-25 MED ORDER — SODIUM CHLORIDE 0.9 % IR SOLN
Status: DC | PRN
  Administered 2018-04-25: 3000 mL

## 2018-04-25 MED ORDER — MIDAZOLAM HCL 2 MG/2ML IJ SOLN
INTRAMUSCULAR | Status: AC
  Filled 2018-04-25: qty 2

## 2018-04-25 MED ORDER — GLYCOPYRROLATE PF 0.2 MG/ML IJ SOSY
PREFILLED_SYRINGE | INTRAMUSCULAR | Status: DC | PRN
  Administered 2018-04-25: .2 mg via INTRAVENOUS

## 2018-04-25 MED ORDER — FENTANYL CITRATE (PF) 250 MCG/5ML IJ SOLN
INTRAMUSCULAR | Status: AC
  Filled 2018-04-25: qty 5

## 2018-04-25 MED ORDER — PHENOL 1.4 % MT LIQD: 1.0000 | OROMUCOSAL | Status: DC | PRN

## 2018-04-25 MED ORDER — DEXAMETHASONE SODIUM PHOSPHATE 10 MG/ML IJ SOLN
INTRAMUSCULAR | Status: AC
  Filled 2018-04-25: qty 1

## 2018-04-25 MED ORDER — CEFAZOLIN SODIUM 1 G IJ SOLR
INTRAMUSCULAR | Status: AC
  Filled 2018-04-25: qty 30

## 2018-04-25 MED ORDER — MIDAZOLAM HCL 2 MG/2ML IJ SOLN
INTRAMUSCULAR | Status: DC | PRN
  Administered 2018-04-25: 2 mg via INTRAVENOUS

## 2018-04-25 MED ORDER — MENTHOL 3 MG MT LOZG: 1.0000 | LOZENGE | OROMUCOSAL | Status: DC | PRN

## 2018-04-25 MED ORDER — METOCLOPRAMIDE HCL 5 MG/ML IJ SOLN: 5.0000 mg | Freq: Three times a day (TID) | INTRAMUSCULAR | Status: DC | PRN

## 2018-04-25 MED ORDER — DOCUSATE SODIUM 100 MG PO CAPS
100.0000 mg | ORAL_CAPSULE | Freq: Two times a day (BID) | ORAL | Status: DC
  Administered 2018-04-25 – 2018-04-26 (×2): 100 mg via ORAL
  Filled 2018-04-25 (×2): qty 1

## 2018-04-25 MED ORDER — PROMETHAZINE HCL 25 MG/ML IJ SOLN: 6.2500 mg | INTRAMUSCULAR | Status: DC | PRN

## 2018-04-25 MED ORDER — ACETAMINOPHEN 500 MG PO TABS
1000.0000 mg | ORAL_TABLET | Freq: Four times a day (QID) | ORAL | Status: AC
  Administered 2018-04-25 – 2018-04-26 (×4): 1000 mg via ORAL
  Filled 2018-04-25 (×4): qty 2

## 2018-04-25 MED ORDER — METOCLOPRAMIDE HCL 5 MG PO TABS: 5.0000 mg | ORAL_TABLET | Freq: Three times a day (TID) | ORAL | Status: DC | PRN

## 2018-04-25 MED ORDER — ONDANSETRON HCL 4 MG/2ML IJ SOLN
INTRAMUSCULAR | Status: DC | PRN
  Administered 2018-04-25: 4 mg via INTRAVENOUS

## 2018-04-25 MED ORDER — ONDANSETRON HCL 4 MG PO TABS: 4.0000 mg | ORAL_TABLET | Freq: Four times a day (QID) | ORAL | Status: DC | PRN

## 2018-04-25 MED ORDER — VANCOMYCIN HCL 1000 MG IV SOLR
INTRAVENOUS | Status: DC | PRN
  Administered 2018-04-25: 1000 mg

## 2018-04-25 MED ORDER — FENTANYL CITRATE (PF) 100 MCG/2ML IJ SOLN: 25.0000 ug | INTRAMUSCULAR | Status: DC | PRN

## 2018-04-25 MED ORDER — OXYCODONE HCL 5 MG PO TABS
5.0000 mg | ORAL_TABLET | ORAL | Status: DC | PRN
  Administered 2018-04-26: 5 mg via ORAL
  Filled 2018-04-25: qty 1

## 2018-04-25 MED ORDER — ROCURONIUM BROMIDE 50 MG/5ML IV SOSY
PREFILLED_SYRINGE | INTRAVENOUS | Status: AC
  Filled 2018-04-25: qty 5

## 2018-04-25 MED ORDER — LACTATED RINGERS IV SOLN
INTRAVENOUS | Status: DC | PRN
  Administered 2018-04-25 (×2): via INTRAVENOUS

## 2018-04-25 MED ORDER — MONTELUKAST SODIUM 10 MG PO TABS
10.0000 mg | ORAL_TABLET | Freq: Every day | ORAL | Status: DC
  Filled 2018-04-25: qty 1

## 2018-04-25 MED ORDER — VANCOMYCIN HCL 1000 MG IV SOLR
INTRAVENOUS | Status: AC
  Filled 2018-04-25: qty 1000

## 2018-04-25 MED ORDER — GLYCOPYRROLATE PF 0.2 MG/ML IJ SOSY
PREFILLED_SYRINGE | INTRAMUSCULAR | Status: AC
  Filled 2018-04-25: qty 3

## 2018-04-25 MED ORDER — BUPIVACAINE HCL (PF) 0.5 % IJ SOLN
INTRAMUSCULAR | Status: DC | PRN
  Administered 2018-04-25: 15 mL via PERINEURAL

## 2018-04-25 MED ORDER — ASPIRIN EC 81 MG PO TBEC
81.0000 mg | DELAYED_RELEASE_TABLET | Freq: Two times a day (BID) | ORAL | Status: DC
  Administered 2018-04-25 – 2018-04-26 (×2): 81 mg via ORAL
  Filled 2018-04-25 (×2): qty 1

## 2018-04-25 MED ORDER — PROPOFOL 10 MG/ML IV BOLUS
INTRAVENOUS | Status: DC | PRN
  Administered 2018-04-25: 150 mg via INTRAVENOUS

## 2018-04-25 MED ORDER — ONDANSETRON HCL 4 MG/2ML IJ SOLN: 4.0000 mg | Freq: Four times a day (QID) | INTRAMUSCULAR | Status: DC | PRN

## 2018-04-25 MED ORDER — DEXAMETHASONE SODIUM PHOSPHATE 10 MG/ML IJ SOLN
INTRAMUSCULAR | Status: DC | PRN
  Administered 2018-04-25: 10 mg via INTRAVENOUS

## 2018-04-25 MED ORDER — ONDANSETRON HCL 4 MG/2ML IJ SOLN
INTRAMUSCULAR | Status: AC
  Filled 2018-04-25: qty 2

## 2018-04-25 MED ORDER — HYDROMORPHONE HCL 1 MG/ML IJ SOLN: 0.5000 mg | INTRAMUSCULAR | Status: DC | PRN

## 2018-04-25 MED ORDER — CEFAZOLIN SODIUM-DEXTROSE 2-4 GM/100ML-% IV SOLN
2.0000 g | INTRAVENOUS | Status: AC
  Administered 2018-04-25 (×2): 2 g via INTRAVENOUS
  Filled 2018-04-25: qty 100

## 2018-04-25 MED ORDER — METHOCARBAMOL 500 MG PO TABS
500.0000 mg | ORAL_TABLET | Freq: Four times a day (QID) | ORAL | Status: DC | PRN
  Filled 2018-04-25: qty 1

## 2018-04-25 MED ORDER — 0.9 % SODIUM CHLORIDE (POUR BTL) OPTIME
TOPICAL | Status: DC | PRN
  Administered 2018-04-25 (×2): 1000 mL

## 2018-04-25 MED ORDER — LACTATED RINGERS IV SOLN
INTRAVENOUS | Status: AC
  Administered 2018-04-25 (×2): via INTRAVENOUS

## 2018-04-25 MED ORDER — ROCURONIUM BROMIDE 10 MG/ML (PF) SYRINGE
PREFILLED_SYRINGE | INTRAVENOUS | Status: DC | PRN
  Administered 2018-04-25: 50 mg via INTRAVENOUS

## 2018-04-25 MED ORDER — CEFAZOLIN SODIUM-DEXTROSE 2-4 GM/100ML-% IV SOLN
2.0000 g | Freq: Four times a day (QID) | INTRAVENOUS | Status: AC
  Administered 2018-04-25 (×2): 2 g via INTRAVENOUS
  Filled 2018-04-25 (×2): qty 100

## 2018-04-25 SURGICAL SUPPLY — 75 items
BASEPLATE AUG MED W-TAPER (Plate) ×3 IMPLANT
BEARING HUMERAL SHLDER 36M STD (Shoulder) ×1 IMPLANT
BIT DRILL 2.7 W/STOP DISP (BIT) ×2 IMPLANT
BIT DRILL 2.7MM W/STOP DISP (BIT) ×1
BIT DRILL F/CENTRAL SCRW 3.2 (BIT) ×1
BIT DRILL F/CENTRAL SCRW 3.2MM (BIT) ×1 IMPLANT
BIT DRILL TWIST 2.7 (BIT) ×2 IMPLANT
BIT DRILL TWIST 2.7MM (BIT) ×1
BLADE SAW SGTL 13X75X1.27 (BLADE) ×3 IMPLANT
CLOSURE WOUND 1/2 X4 (GAUZE/BANDAGES/DRESSINGS) ×1
COVER SURGICAL LIGHT HANDLE (MISCELLANEOUS) ×3 IMPLANT
DRAPE INCISE IOBAN 66X45 STRL (DRAPES) ×6 IMPLANT
DRAPE U-SHAPE 47X51 STRL (DRAPES) ×6 IMPLANT
DRILL BIT F/CENTRAL SCRW 3.2MM (BIT) ×2
DRSG AQUACEL AG ADV 3.5X10 (GAUZE/BANDAGES/DRESSINGS) ×3 IMPLANT
DURAPREP 26ML APPLICATOR (WOUND CARE) ×6 IMPLANT
ELECT BLADE 4.0 EZ CLEAN MEGAD (MISCELLANEOUS) ×3
ELECT REM PT RETURN 9FT ADLT (ELECTROSURGICAL) ×3
ELECTRODE BLDE 4.0 EZ CLN MEGD (MISCELLANEOUS) ×1 IMPLANT
ELECTRODE REM PT RTRN 9FT ADLT (ELECTROSURGICAL) ×1 IMPLANT
GLENOID SPHERE STD STRL 36MM (Orthopedic Implant) ×3 IMPLANT
GLOVE BIOGEL PI IND STRL 7.5 (GLOVE) IMPLANT
GLOVE BIOGEL PI IND STRL 8 (GLOVE) ×1 IMPLANT
GLOVE BIOGEL PI INDICATOR 7.5 (GLOVE)
GLOVE BIOGEL PI INDICATOR 8 (GLOVE) ×2
GLOVE ECLIPSE 7.0 STRL STRAW (GLOVE) ×3 IMPLANT
GLOVE SURG ORTHO 8.0 STRL STRW (GLOVE) ×3 IMPLANT
GOWN STRL REUS W/ TWL LRG LVL3 (GOWN DISPOSABLE) IMPLANT
GOWN STRL REUS W/ TWL XL LVL3 (GOWN DISPOSABLE) ×2 IMPLANT
GOWN STRL REUS W/TWL LRG LVL3 (GOWN DISPOSABLE)
GOWN STRL REUS W/TWL XL LVL3 (GOWN DISPOSABLE) ×4
KIT BASIN OR (CUSTOM PROCEDURE TRAY) ×3 IMPLANT
KIT BEACH CHAIR TRIMANO (MISCELLANEOUS) IMPLANT
KIT TURNOVER KIT B (KITS) ×3 IMPLANT
MANIFOLD NEPTUNE II (INSTRUMENTS) ×3 IMPLANT
MODEL GLENOID TOTAL SIGNATURE (SYSTAGENIX WOUND MANAGEMENT) ×3 IMPLANT
NDL SUT 6 .5 CRC .975X.05 MAYO (NEEDLE) ×1 IMPLANT
NEEDLE HYPO 25GX1X1/2 BEV (NEEDLE) IMPLANT
NEEDLE MAYO TAPER (NEEDLE) ×2
NS IRRIG 1000ML POUR BTL (IV SOLUTION) ×3 IMPLANT
PACK SHOULDER (CUSTOM PROCEDURE TRAY) ×3 IMPLANT
PAD ARMBOARD 7.5X6 YLW CONV (MISCELLANEOUS) ×3 IMPLANT
PASSER SUT SWANSON 36MM LOOP (INSTRUMENTS) ×3 IMPLANT
PIN THREADED REVERSE (PIN) ×3 IMPLANT
REAMER GUIDE BUSHING SURG DISP (MISCELLANEOUS) ×3 IMPLANT
REAMER GUIDE W/SCREW AUG (MISCELLANEOUS) ×3 IMPLANT
RESTRAINT HEAD UNIVERSAL NS (MISCELLANEOUS) ×3 IMPLANT
SCREW BONE LOCKING 4.75X30X3.5 (Screw) ×3 IMPLANT
SCREW BONE STRL 6.5MMX25MM (Screw) ×3 IMPLANT
SCREW LOCKING 4.75MMX15MM (Screw) ×6 IMPLANT
SCREW LOCKING STRL 4.75X25X3.5 (Screw) ×3 IMPLANT
SHOULDER HUMERAL BEAR 36M STD (Shoulder) ×3 IMPLANT
SLING ARM IMMOBILIZER MED (SOFTGOODS) ×3 IMPLANT
SPONGE LAP 18X18 X RAY DECT (DISPOSABLE) ×6 IMPLANT
STEM HUMERAL STRL 11MMX83MM (Stem) ×3 IMPLANT
STRIP CLOSURE SKIN 1/2X4 (GAUZE/BANDAGES/DRESSINGS) ×2 IMPLANT
SUCTION FRAZIER HANDLE 10FR (MISCELLANEOUS) ×2
SUCTION TUBE FRAZIER 10FR DISP (MISCELLANEOUS) ×1 IMPLANT
SUT FIBERWIRE #2 38 T-5 BLUE (SUTURE)
SUT MAXBRAID (SUTURE) ×9 IMPLANT
SUT MNCRL AB 3-0 PS2 18 (SUTURE) ×3 IMPLANT
SUT VIC AB 0 CT1 27 (SUTURE) ×8
SUT VIC AB 0 CT1 27XBRD ANBCTR (SUTURE) ×4 IMPLANT
SUT VIC AB 1 CT1 27 (SUTURE) ×2
SUT VIC AB 1 CT1 27XBRD ANBCTR (SUTURE) ×1 IMPLANT
SUT VIC AB 2-0 CT1 27 (SUTURE) ×6
SUT VIC AB 2-0 CT1 TAPERPNT 27 (SUTURE) ×3 IMPLANT
SUT VICRYL 0 UR6 27IN ABS (SUTURE) ×9 IMPLANT
SUTURE FIBERWR #2 38 T-5 BLUE (SUTURE) IMPLANT
SYR CONTROL 10ML LL (SYRINGE) IMPLANT
TOWEL OR 17X24 6PK STRL BLUE (TOWEL DISPOSABLE) ×3 IMPLANT
TOWEL OR 17X26 10 PK STRL BLUE (TOWEL DISPOSABLE) ×3 IMPLANT
TRAY FOLEY BAG SILVER LF 16FR (CATHETERS) IMPLANT
TRAY HUM MINI SHOULDER +3 40 (Joint) ×3 IMPLANT
WATER STERILE IRR 1000ML POUR (IV SOLUTION) ×3 IMPLANT

## 2018-04-25 NOTE — Anesthesia Procedure Notes (Signed)
Anesthesia Regional Block: Interscalene brachial plexus block   Pre-Anesthetic Checklist: ,, timeout performed, Correct Patient, Correct Site, Correct Laterality, Correct Procedure, Correct Position, site marked, Risks and benefits discussed,  Surgical consent,  Pre-op evaluation,  At surgeon's request and post-op pain management  Laterality: Left  Prep: chloraprep       Needles:  Injection technique: Single-shot  Needle Type: Echogenic Stimulator Needle     Needle Length: 9cm  Needle Gauge: 21     Additional Needles:   Procedures:, nerve stimulator,,, ultrasound used (permanent image in chart),,,,   Nerve Stimulator or Paresthesia:  Response: deltoid , 0.5 mA,   Additional Responses:   Narrative:  Start time: 04/25/2018 7:03 AM End time: 04/25/2018 7:10 AM Injection made incrementally with aspirations every 5 mL.  Performed by: Personally  Anesthesiologist: Suzette Battiest, MD

## 2018-04-25 NOTE — Brief Op Note (Signed)
04/25/2018  12:48 PM  PATIENT:  Brooke Cantrell  72 y.o. female  PRE-OPERATIVE DIAGNOSIS:  LEFT SHOULDER OSTEOARTHRITIS  POST-OPERATIVE DIAGNOSIS:  left shoulder osteoarthritis  PROCEDURE:  Procedure(s): LEFT TOTAL SHOULDER REPLACEMENT  SURGEON:  Surgeon(s): Marlou Sa, Tonna Corner, MD  ASSISTANT: queen rnfa  ANESTHESIA:   general  EBL: 200 ml    Total I/O In: 1300 [I.V.:1300] Out: 200 [Blood:200]  BLOOD ADMINISTERED: none  DRAINS: none   LOCAL MEDICATIONS USED:  none  SPECIMEN:  No Specimen  COUNTS:  YES  TOURNIQUET:  * No tourniquets in log *  DICTATION: .Other Dictation: Dictation Number [id number]  PLAN OF CARE: Admit to inpatient   PATIENT DISPOSITION:  PACU - hemodynamically stable

## 2018-04-25 NOTE — Anesthesia Procedure Notes (Signed)
Procedure Name: Intubation Date/Time: 04/25/2018 7:42 AM Performed by: Valda Favia, CRNA Pre-anesthesia Checklist: Patient identified, Emergency Drugs available, Suction available and Patient being monitored Patient Re-evaluated:Patient Re-evaluated prior to induction Oxygen Delivery Method: Circle System Utilized Preoxygenation: Pre-oxygenation with 100% oxygen Induction Type: IV induction Ventilation: Mask ventilation without difficulty Laryngoscope Size: Mac and 4 Grade View: Grade I Tube type: Oral Tube size: 7.0 mm Number of attempts: 1 Airway Equipment and Method: Stylet and Oral airway Placement Confirmation: ETT inserted through vocal cords under direct vision,  positive ETCO2 and breath sounds checked- equal and bilateral Secured at: 22 cm Tube secured with: Tape Dental Injury: Teeth and Oropharynx as per pre-operative assessment

## 2018-04-25 NOTE — Anesthesia Postprocedure Evaluation (Signed)
Anesthesia Post Note  Patient: Brooke Cantrell  Procedure(s) Performed: LEFT TOTAL SHOULDER REPLACEMENT (Left Shoulder)     Patient location during evaluation: PACU Anesthesia Type: General Level of consciousness: awake and alert Pain management: pain level controlled Vital Signs Assessment: post-procedure vital signs reviewed and stable Respiratory status: spontaneous breathing, nonlabored ventilation, respiratory function stable and patient connected to nasal cannula oxygen Cardiovascular status: blood pressure returned to baseline and stable Postop Assessment: no apparent nausea or vomiting Anesthetic complications: no    Last Vitals:  Vitals:   04/25/18 1252 04/25/18 1307  BP: (!) 143/81 140/80  Pulse: 82 83  Resp: 16 13  Temp:    SpO2: 97% 98%    Last Pain:  Vitals:   04/25/18 1252  TempSrc:   PainSc: Tyler Deis

## 2018-04-25 NOTE — H&P (Signed)
Brooke Cantrell is an 72 y.o. female.   Chief Complaint: Left shoulder pain HPI: Patient presents with a long history of left shoulder pain.  She reports pain with ADLs as well as some pain at night.  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  She is failed conservative measures and presents now for operative management after explanation of risks and benefits.  Past Medical History:  Diagnosis Date  . Arthritis   . Breast cancer ([place])    Left breast  . Depression    "mild", no meds  . History of kidney stones     Past Surgical History:  Procedure Laterality Date  . breast cancer surgery    . BREAST ENHANCEMENT SURGERY    . BREAST LUMPECTOMY Left   . CATARACT EXTRACTION, BILATERAL    . ECTOPIC PREGNANCY SURGERY    . EYE SURGERY    . INNER EAR SURGERY    . TONSILLECTOMY    . TUBAL LIGATION      Family History  Problem Relation Age of Onset  . Allergic rhinitis Father   . Allergic rhinitis Sister   . Allergic rhinitis Brother    Social History:  reports that she quit smoking about 21 years ago. She has never used smokeless tobacco. She reports that she drinks about 5.0 - 7.0 standard drinks of alcohol per week. She reports that she does not use drugs.  Allergies:  Allergies  Allergen Reactions  . Ciprofloxacin Itching and Rash    Medications Prior to Admission  Medication Sig Dispense Refill  . CALCIUM-VITAMIN D PO Take 1 tablet by mouth daily.    . Cranberry 450 MG CAPS Take 450 capsules by mouth daily.     Marland Kitchen gabapentin (NEURONTIN) 300 MG capsule TAKE ONE CAPSULE BY MOUTH WITH BREAKFAST AND LUNCH AND TAKE 2 CAPSULES AT BEDTIME (Patient taking differently: TAKE 600MG  BY MOUTH AT BEDTIME) 120 capsule 0  . glucosamine-chondroitin 500-400 MG tablet Take 1 tablet by mouth daily.     . montelukast (SINGULAIR) 10 MG tablet TAKE ONE TABLET ONCE DAILY AS DIRECTED 30 tablet 5  . Multiple  Vitamin (MULTIVITAMIN) capsule Take 1 capsule by mouth daily.      No results found for this or any previous visit (from the past 48 hour(s)). No results found.  Review of Systems  Musculoskeletal: Positive for joint pain.  All other systems reviewed and are negative.   Blood pressure (!) 166/88, pulse 82, temperature 98.6 F (37 C), temperature source Oral, resp. rate 20, weight 74.8 kg, SpO2 96 %. Physical Exam  Constitutional: She appears well-developed.  HENT:  Head: Normocephalic.  Eyes: Pupils are equal, round, and reactive to light.  Neck: Normal range of motion.  Cardiovascular: Normal rate.  Respiratory: Effort normal.  Neurological: She is alert.  Skin: Skin is warm.  Psychiatric: She has a normal mood and affect.  Left shoulder demonstrates restricted passive range of motion to about 40 degrees of external rotation on the left compared to 55 on the right.  He has some coarseness and grinding with any attempts at forward flexion and abduction beyond 90 degrees.  She has no AC joint tenderness on the left.  Subscap strength appears to be intact.  Motor or sensory function to the hand is intact.  Deltoid is functional.  Assessment/Plan Impression is left shoulder arthritis with some narrowing of the acromiohumeral distance but reasonable cuff strength on exam.  Plan  is left shoulder replacement either total shoulder replacement or reverse shoulder replacement.  This will really depend on the status of her rotator cuff.  Likely with any evidence of rotator cuff pathology that we would use reverse shoulder replacement to diminish the potential for further surgery.  Risk and benefits of surgery discussed including but not limited to infection nerve and vessel damage dislocation and patient understands and wishes to proceed.  Anderson Malta, MD 04/25/2018, 6:56 AM

## 2018-04-25 NOTE — Op Note (Signed)
NAMEJEANI, FASSNACHT MEDICAL RECORD [id number] ACCOUNT [id number] DATE OF BIRTH:1946-01-15 FACILITY: [facility] LOCATION: [facility] PHYSICIAN:GREGORY Randel Pigg, MD  OPERATIVE REPORT  DATE OF PROCEDURE:  04/25/2018  PREOPERATIVE DIAGNOSIS:  Left shoulder arthritis.  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  PROCEDURE:  Left reverse shoulder replacement using Biomet reverse shoulder replacement medium augmented baseplate with mini taper adapter.  One central screw 4 fixed locking screws, 36 mm standard glenosphere with 11 mm mini humeral stem with a 36 mm  polyethylene bearing with 40 mm standard thickness humeral tray taper offset.  SURGEON:  Meredith Pel, MD  ASSISTANT:  Dyke Brackett, RNFA.  INDICATIONS:  This is a patient with left shoulder pain.  She has significant arthritis and limitation of functional ability due to the pain.  She presents now for operative management after explanation of risks and benefits.  PROCEDURE IN DETAIL:  The patient was brought to the operating room where general anesthetic was induced.  Preoperative antibiotics administered.  Timeout was called.  The patient was placed in the beach chair position with the head in neutral position.   Left shoulder, arm and hand prescrubbed with hydrogen peroxide followed by alcohol and Betadine, which was allowed to air dry and then the whole arm and hand was prepped with DuraPrep solution and draped in a sterile manner.  Ioban used to cover the  operative field.  The patient did have some limited external rotation at 15 degrees of abduction to about 35.  Isolated glenohumeral forward flexion and abduction also was about 90 degrees or slightly less.  Timeout was called.  Deltopectoral approach  was made.  Cephalic vein was not present.  Subacromial adhesions were removed.  The biceps tendon was tenodesed to the upper portion of the pectoralis.  Subscap was then peeled off the  lesser tuberosity.  The circumflex vessels prior to subscap removal  were suture ligated.  The axillary nerve was then identified, visualized and a vessel loop placed around it.  It was protected at all times during the case.  At this time, the subscapularis was completed and its removal.  The capsule was released about 2  cm off the inferior humeral neck down to around the 7 o'clock position on the humeral head.  At this time, the shoulder was dislocated.  The rongeur was then utilized and cut was made on the humerus.  This was made at about 30 degrees of retroversion.   A cap was then placed and attention was directed towards the glenoid.  The capsule was released with care being taken to avoid injury to the axillary nerve going from the 12 o'clock position all the way around to about the 9 o'clock position on the  glenoid face.  At this time, capsule release was performed and completed.  This gave adequate mobilization of the humerus in order to fall posteriorly from the glenoid.  At this time, the labrum was excised.  Biceps tendon was also excised at its stump  beginning superiorly and extending down to around the 6 o'clock position, both posteriorly and anteriorly.  Following this, The patient specific guide was placed and the pin was placed in good position.  At this time, reaming was performed.  This was  primarily a reaming anteriorly and inferiorly approximately 5 mm.  The patient did have a B2 type glenoid which necessitated that reaming pattern.  In addition, the patient had severely attenuated, but intact supraspinatus tendon attachment.  In general,  the tendon attachment did not appear to be healthy and I did not think that would withstand long-term without failing.  For that reason, the decision was made to proceed with reverse shoulder replacement prior to guide pin placement.  Once this was done  correct restoration of normal geometry was achieved with a medium baseplate offset.  Reaming  was performed and good bleeding bone was encountered.  At this time, the baseplate was placed and the central screw achieved excellent fixation.  Four  peripheral locking screws were placed.  A standard offset glenosphere was then chosen due to the patient intact infraspinatus.  The glenosphere was then placed and attention was directed towards the humerus.  Humerus was initially reamed and broached.   It was broached up to a size 11.   We had to take about 4-5 more millimeters off of the humerus in order to achieve a level where implant could be placed.  This took [place] back to about 2 mm recessed from the rotator cuff attachment.  With the broach in  place, the patient had excellent stability with a +3 liner.  This was placed and the patient had good stability and with external rotation, forward flexion as well as with adduction, extension and superiorly directed force.  Trial components were removed  on the humeral side and true components placed with same stability parameters were maintained.  There was good tension on both the deltoid, the conjoined tendon and the axillary nerve is intact.  At this time, thorough irrigation was performed and  vancomycin powder was placed within the joint.  The deltopectoral split was then closed after reattaching the subscap both to the rotator interval with the arm in about 30 degrees of external rotation as well as to the native attachment site.  It should  be noted that bone quality was somewhat marginal at this attachment site on the bony part of the humerus.  Following this, thorough irrigation again performed.  Vancomycin placed in this layer, which was then closed using 0 Vicryl suture, 2-0 Vicryl  suture and a 3-0 Monocryl.  The patient then had an Aquacel dressing, placed in a shoulder immobilizer.    She tolerated the procedure well without immediate complications.  He was transferred to the recovery room in stable condition.  AN/NUANCE  D:04/25/2018  T:04/25/2018 JOB:[id number]

## 2018-04-25 NOTE — Transfer of Care (Signed)
Immediate Anesthesia Transfer of Care Note  Patient: Brooke Cantrell  Procedure(s) Performed: LEFT TOTAL SHOULDER REPLACEMENT (Left Shoulder)  Patient Location: PACU  Anesthesia Type:GA combined with regional for post-op pain  Level of Consciousness: drowsy  Airway & Oxygen Therapy: Patient Spontanous Breathing and Patient connected to nasal cannula oxygen  Post-op Assessment: Report given to RN and Post -op Vital signs reviewed and stable  Post vital signs: Reviewed and stable  Last Vitals:  Vitals Value Taken Time  BP 127/85 04/25/2018 12:22 PM  Temp    Pulse 81 04/25/2018 12:25 PM  Resp 15 04/25/2018 12:25 PM  SpO2 97 % 04/25/2018 12:25 PM  Vitals shown include unvalidated device data.  Last Pain:  Vitals:   04/25/18 1222  TempSrc:   PainSc: (P) Asleep      Patients Stated Pain Goal: 3 (82/42/35 [id number])  Complications: No apparent anesthesia complications

## 2018-04-26 ENCOUNTER — Other Ambulatory Visit (INDEPENDENT_AMBULATORY_CARE_PROVIDER_SITE_OTHER): Payer: Self-pay | Admitting: Orthopedic Surgery

## 2018-04-26 ENCOUNTER — Encounter (HOSPITAL_COMMUNITY): Payer: Self-pay | Admitting: Orthopedic Surgery

## 2018-04-26 LAB — URINE CULTURE: CULTURE: NO GROWTH

## 2018-04-26 MED ORDER — ASPIRIN 81 MG PO TBEC: 81.0000 mg | DELAYED_RELEASE_TABLET | Freq: Two times a day (BID) | ORAL | 0 refills | Status: DC

## 2018-04-26 NOTE — Plan of Care (Signed)
  Problem: Education: Goal: Knowledge of General Education information will improve Description Including pain rating scale, medication(s)/side effects and non-pharmacologic comfort measures Outcome: Progressing Note:  POC reviewed with pt./husband.

## 2018-04-26 NOTE — Evaluation (Signed)
Occupational Therapy Evaluation Patient Details Name: Brooke Cantrell MRN: [id number] DOB: 04-Apr-1946 Today's Date: 04/26/2018    History of Present Illness Pt is as 72 y/o female s/p L reverse shoulder replacement after long history of shoulder pain from arthritis affecting ADLS, failing conservative measures. PMH significant for but not limited to: arthritis, breast CA, depression.   Clinical Impression   PTA patient independent with ADLs, IADLs and mobility.  Currently she requires min assist for UB ADLs, min guard for LB ADLs, min guard for toileting and toilet transfers, min guard for tub transfers, and supervision for bed mobility.  She is limited by decreased functional use of L UE s/p surgery and precautions, "groggy'/dizzyness with mobility (suspect per medication wearing off- per pt report).  Nerve block limiting AROM of elbow and forearm at this time, but patinet able to demonstrate SROM until AROM returns. Education provided on precautions, exercises to L UE, mobility, safety, sling use and wear schedule, positioning, ADL compensatory techniques, and recommendations.  Patient will have support of her spouse at dc.  Will continue to follow while admitted in order to maximize safety and independence, but recommend following surgeon's recommendation after dc.       Follow Up Recommendations  Follow surgeon's recommendation for DC plan and follow-up therapies;Supervision/Assistance - 24 hour    Equipment Recommendations  None recommended by OT    Recommendations for Other Services       Precautions / Restrictions Precautions Precautions: Shoulder;Fall Type of Shoulder Precautions: Passive protocol: PROM FF 90, abduction 0, ER 30; no AROM at shoulder; AROM to tolerance elbow/wrist/hand  Shoulder Interventions: Shoulder sling/immobilizer;At all times;Off for dressing/bathing/exercises Precaution Booklet Issued: Yes (comment) Precaution Comments: educated patinet and spouse on  precautions  Required Braces or Orthoses: Sling Restrictions Weight Bearing Restrictions: Yes LUE Weight Bearing: Non weight bearing      Mobility Bed Mobility Overal bed mobility: Needs Assistance Bed Mobility: Supine to Sit;Sit to Supine     Supine to sit: Supervision;HOB elevated Sit to supine: Supervision;HOB elevated   General bed mobility comments: supervision for safety, good control and technique  Transfers Overall transfer level: Needs assistance Equipment used: None Transfers: Sit to/from Stand Sit to Stand: Min guard         General transfer comment: min guard for safety due to groggy     Balance Overall balance assessment: Mild deficits observed, not formally tested                                         ADL either performed or assessed with clinical judgement   ADL Overall ADL's : Needs assistance/impaired Eating/Feeding: Set up;Sitting   Grooming: Set up;Sitting   Upper Body Bathing: Minimal assistance;Sitting Upper Body Bathing Details (indicate cue type and reason): educated on compensatory techniques and setup for bathing  Lower Body Bathing: Min guard;Sit to/from stand Lower Body Bathing Details (indicate cue type and reason): min guard for safety when standing  Upper Body Dressing : Minimal assistance;Sitting;With caregiver independent assisting Upper Body Dressing Details (indicate cue type and reason): educated on compensatory techniques for dressing, spouse able to assist with L UE Lower Body Dressing: Min guard;Sit to/from stand;Cueing for compensatory techniques Lower Body Dressing Details (indicate cue type and reason): min guard for safety in standing, figure 4 technique to reach feet and able to don/doff socks Toilet Transfer: Min guard;Ambulation;Comfort height toilet;Grab bars Toilet Transfer  Details (indicate cue type and reason): min guard for safety  Toileting- Clothing Manipulation and Hygiene: Min guard;Sit to/from  stand   Tub/ Shower Transfer: Min guard;Ambulation(simulated in room) Tub/Shower Transfer Details (indicate cue type and reason): agreeable to shower chair in shower, min guard for safety stepping over threshold Functional mobility during ADLs: Min guard(for safety ) General ADL Comments: min guard for mobiliyt due to feeling "groggy" and "dizzy" with mobility     Vision   Vision Assessment?: No apparent visual deficits     Perception     Praxis      Pertinent Vitals/Pain Pain Assessment: Faces Faces Pain Scale: Hurts a little bit(nerve block still working) Pain Location: L UE  Pain Descriptors / Indicators: Discomfort Pain Intervention(s): Monitored during session;Ice applied     Hand Dominance Right   Extremity/Trunk Assessment Upper Extremity Assessment Upper Extremity Assessment: LUE deficits/detail LUE Deficits / Details: s/p surgery; AAROM for elbow and supination due to nerve block, WFL hand/wrist AROM  (PROM at shoulder WFL within limitation) LUE: Unable to fully assess due to immobilization(within shoulder ROM precautions) LUE Sensation: decreased light touch(nerve block wearing off) LUE Coordination: decreased fine motor;decreased gross motor   Lower Extremity Assessment Lower Extremity Assessment: Overall WFL for tasks assessed   Cervical / Trunk Assessment Cervical / Trunk Assessment: Normal   Communication Communication Communication: No difficulties   Cognition Arousal/Alertness: Awake/alert("groggy" per patinet ) Behavior During Therapy: WFL for tasks assessed/performed Overall Cognitive Status: Within Functional Limits for tasks assessed                                     General Comments  spouse present and supportive    Exercises Exercises: Shoulder;Hand exercises Shoulder Exercises Shoulder Flexion: PROM;Left;5 reps;Supine(to 90 ONLY) Shoulder ABduction: PROM;Left;5 reps;Supine(to 60 ONLY) Shoulder External Rotation:  PROM;Left;5 reps;Supine(to 30 ONLY) Elbow Flexion: AAROM;Left;10 reps;Supine(AAROM/SROM due to nerve block limiting AROM) Elbow Extension: AAROM;Left;10 reps;Supine(AAROM/SROM due to nerve block limiting AROM) Wrist Flexion: AROM;Left;10 reps;Supine Wrist Extension: AROM;Left;10 reps;Supine Digit Composite Flexion: AROM;Right;10 reps;Supine Composite Extension: AROM;Left;10 reps;Supine Neck Lateral Flexion - Right: AROM;5 reps;Standing Neck Lateral Flexion - Left: AROM;5 reps;Standing Hand Exercises Forearm Supination: AAROM;Left;10 reps;Supine(AAROM/SROM due to nerve block limiting AROM ) Forearm Pronation: AAROM;Left;10 reps;Supine(AAROM/SROM nerve block limiting AROM)   Shoulder Instructions Shoulder Instructions Donning/doffing shirt without moving shoulder: Caregiver independent with task;Minimal assistance Method for sponge bathing under operated UE: Caregiver independent with task;Modified independent Donning/doffing sling/immobilizer: Supervision/safety;Caregiver independent with task Correct positioning of sling/immobilizer: Supervision/safety;Caregiver independent with task ROM for elbow, wrist and digits of operated UE: Minimal assistance;Caregiver independent with task(limited AROM at elbow/forearm due to nerve block) Sling wearing schedule (on at all times/off for ADL's): Modified independent Proper positioning of operated UE when showering: Supervision/safety;Caregiver independent with task Positioning of UE while sleeping: Supervision/safety;Caregiver independent with task    Home Living Family/patient expects to be discharged to:: Private residence Living Arrangements: Spouse/significant other Available Help at Discharge: Family;Available 24 hours/day Type of Home: House Home Access: Stairs to enter [employer] of Steps: 3 Entrance Stairs-Rails: None Home Layout: Two level;Able to live on main level with bedroom/bathroom     Bathroom Shower/Tub: Tub/shower  unit   Bathroom Toilet: Handicapped height Bathroom Accessibility: Yes   Home Equipment: Grab bars - tub/shower;Grab bars - toilet;Shower seat          Prior Functioning/Environment Level of Independence: Independent  OT Problem List: Decreased range of motion;Decreased activity tolerance;Impaired balance (sitting and/or standing);Decreased coordination;Decreased knowledge of use of DME or AE;Decreased knowledge of precautions;Pain;Impaired UE functional use      OT Treatment/Interventions: Self-care/ADL training;Therapeutic exercise;DME and/or AE instruction;Therapeutic activities;Patient/family education;Balance training    OT Goals(Current goals can be found in the care plan section) Acute Rehab OT Goals Patient Stated Goal: home and to feel better OT Goal Formulation: With patient/family Time For Goal Achievement: 05/10/18 Potential to Achieve Goals: Good  OT Frequency: Min 2X/week   Barriers to D/C:            Co-evaluation              AM-PAC PT "6 Clicks" Daily Activity     Outcome Measure Help from another person eating meals?: A Little Help from another person taking care of personal grooming?: A Little Help from another person toileting, which includes using toliet, bedpan, or urinal?: A Little Help from another person bathing (including washing, rinsing, drying)?: A Lot Help from another person to put on and taking off regular upper body clothing?: A Little Help from another person to put on and taking off regular lower body clothing?: A Little 6 Click Score: 17   End of Session Equipment Utilized During Treatment: Other (comment)(sling immobilizer L) Nurse Communication: Mobility status  Activity Tolerance: Patient tolerated treatment well Patient left: in bed;with call bell/phone within reach;with family/visitor present  OT Visit Diagnosis: Unsteadiness on feet (R26.81);Muscle weakness (generalized) (M62.81);Pain Pain -  Right/Left: Left Pain - part of body: Shoulder                Time: 6144-3154 OT Time Calculation (min): 37 min Charges:  OT General Charges $OT Visit: 1 Visit OT Evaluation $OT Eval Moderate Complexity: 1 Mod OT Treatments $Self Care/Home Management : 8-22 mins  Delight Stare, OTR/L  Pager [facility] 04/26/2018, 9:52 AM

## 2018-04-26 NOTE — Progress Notes (Signed)
1600 Left shoulder dressing dry and intact, left arm sling on. Discharge instructions given to pt, verbalized understanding. Discharged to home accompanied by spouse.

## 2018-04-26 NOTE — Progress Notes (Signed)
Subjective: Patient stable.  Block is wearing off on the left-hand side.  Pain okay at this time.  Patient speaking well and has no focal neurologic defects other than the left arm where the block has occurred   Objective: Vital signs in last 24 hours: Temp:  [97.2 F (36.2 C)-98 F (36.7 C)] 98 F (36.7 C) (08/16 0531) Pulse Rate:  [78-90] 90 (08/16 0531) Resp:  [11-19] 19 (08/16 0531) BP: (112-162)/(70-93) 112/79 (08/16 0531) SpO2:  [94 %-100 %] 97 % (08/16 0531)  Intake/Output from previous day: 08/15 0701 - 08/16 0700 In: 1300 [I.V.:1300] Out: 500 [Urine:300; Blood:200] Intake/Output this shift: No intake/output data recorded.  Exam:  Compartment soft  Labs: No results for input(s): HGB in the last 72 hours. No results for input(s): WBC, RBC, HCT, PLT in the last 72 hours. No results for input(s): NA, K, CL, CO2, BUN, CREATININE, GLUCOSE, CALCIUM in the last 72 hours. No results for input(s): LABPT, INR in the last 72 hours.  Assessment/Plan: Plan at this time is occupational therapy this morning.  I do want her to take some pain medicine before the block wears off.  I will see her back in about 10 days.  She will use the CPM machine at home.   Brooke Cantrell 04/26/2018, 8:38 AM

## 2018-05-01 ENCOUNTER — Telehealth (INDEPENDENT_AMBULATORY_CARE_PROVIDER_SITE_OTHER): Payer: Self-pay

## 2018-05-01 NOTE — Telephone Encounter (Signed)
IC advised ok to see patient.

## 2018-05-01 NOTE — Telephone Encounter (Signed)
Pt had surgery and no order in chart for HHPT is it ok for them to go out and see the pt? S/p shoulder surgery. Please call to advise if ok for them to go and see the pt.

## 2018-05-02 ENCOUNTER — Telehealth (INDEPENDENT_AMBULATORY_CARE_PROVIDER_SITE_OTHER): Payer: Self-pay | Admitting: Orthopedic Surgery

## 2018-05-02 DIAGNOSIS — Z87891 Personal history of nicotine dependence: Secondary | ICD-10-CM | POA: Diagnosis not present

## 2018-05-02 DIAGNOSIS — Z853 Personal history of malignant neoplasm of breast: Secondary | ICD-10-CM | POA: Diagnosis not present

## 2018-05-02 DIAGNOSIS — Z96612 Presence of left artificial shoulder joint: Secondary | ICD-10-CM | POA: Diagnosis not present

## 2018-05-02 DIAGNOSIS — Z471 Aftercare following joint replacement surgery: Secondary | ICD-10-CM | POA: Diagnosis not present

## 2018-05-02 DIAGNOSIS — F329 Major depressive disorder, single episode, unspecified: Secondary | ICD-10-CM | POA: Diagnosis not present

## 2018-05-02 NOTE — Telephone Encounter (Signed)
IC LM giving verbal order.

## 2018-05-02 NOTE — Telephone Encounter (Signed)
IC verbal given.  

## 2018-05-02 NOTE — Telephone Encounter (Signed)
pls advise. thx

## 2018-05-02 NOTE — Telephone Encounter (Signed)
Verdis Frederickson called from [facility] would like to verify verbal orders. I advised Verdis Frederickson that Lauren called to give verbal orders and left VM. Verdis Frederickson stated she didn't receive VM would like to speak with Lauren. Verdis Frederickson is aware Ander Purpura is currently working in the clinic.

## 2018-05-02 NOTE — Telephone Encounter (Signed)
VERBAL ORDERS FOR 1X WEEK FOR 1 WEEK 3X WEEK FOR 1 WEEK 2X WEEK FOR 1 WEEK  Please call Maria/[phone]

## 2018-05-02 NOTE — Telephone Encounter (Signed)
Hold at 90 until rov

## 2018-05-02 NOTE — Telephone Encounter (Signed)
Patient's husband Christia Reading)  Called asked if patient can go pass 90 on her chair. He advised patient will be at 90 on Saturday. The number to contact Christia Reading is [phone]

## 2018-05-06 DIAGNOSIS — Z87891 Personal history of nicotine dependence: Secondary | ICD-10-CM | POA: Diagnosis not present

## 2018-05-06 DIAGNOSIS — Z471 Aftercare following joint replacement surgery: Secondary | ICD-10-CM | POA: Diagnosis not present

## 2018-05-06 DIAGNOSIS — F329 Major depressive disorder, single episode, unspecified: Secondary | ICD-10-CM | POA: Diagnosis not present

## 2018-05-06 DIAGNOSIS — Z853 Personal history of malignant neoplasm of breast: Secondary | ICD-10-CM | POA: Diagnosis not present

## 2018-05-06 DIAGNOSIS — Z96612 Presence of left artificial shoulder joint: Secondary | ICD-10-CM | POA: Diagnosis not present

## 2018-05-07 NOTE — Discharge Summary (Signed)
Physician Discharge Summary  Patient ID: Brooke Cantrell MRN: [id number] DOB/AGE: [id number] y.o.  Admit date: 04/25/2018 Discharge date: 04/26/2018  Admission Diagnoses:  Active Problems:   Primary osteoarthritis, left shoulder   Shoulder arthritis   Discharge Diagnoses:  Same  Surgeries: Procedure(s): LEFT TOTAL SHOULDER REPLACEMENT on 04/25/2018   Consultants:   Discharged Condition: Stable  Hospital Course: Brooke Cantrell is an 72 y.o. female who was admitted 04/25/2018 with a chief complaint of left shoulder pain, and found to have a diagnosis of left shoulder arthritis.  They were brought to the operating room on 04/25/2018 and underwent the above named procedures.  Patient tolerated the procedure well without immediate complications.  She was started with occupational therapy and physical therapy on postop day #1.  Discharged home in good condition with pain control.  She will undergo home health physical therapy along with shoulder CPM to facilitate range of motion.  I will see her back in 10 days for evaluation.  Antibiotics given:  Anti-infectives (From admission, onward)   Start     Dose/Rate Route Frequency Ordered Stop   04/25/18 1745  ceFAZolin (ANCEF) IVPB 2g/100 mL premix     2 g 200 mL/hr over 30 Minutes Intravenous Every 6 hours 04/25/18 1415 04/26/18 0010   04/25/18 1115  vancomycin (VANCOCIN) powder  Status:  Discontinued       As needed 04/25/18 1124 04/25/18 1225   04/25/18 0600  ceFAZolin (ANCEF) IVPB 2g/100 mL premix     2 g 200 mL/hr over 30 Minutes Intravenous On call to O.R. 04/25/18 0540 04/25/18 1152    .  Recent vital signs:  Vitals:   04/26/18 0531 04/26/18 1000  BP: 112/79 130/78  Pulse: 90 88  Resp: 19   Temp: 98 F (36.7 C) 98.3 F (36.8 C)  SpO2: 97%     Recent laboratory studies:  Results for orders placed or performed during the hospital encounter of 04/25/18  Urine Culture  Result Value Ref Range   Specimen Description IN/OUT  CATH URINE    Special Requests NONE    Culture      NO GROWTH Performed at [facility] [address] [place],  [address]    Report Status 04/26/2018 FINAL     Discharge Medications:   Allergies as of 04/26/2018      Reactions   Ciprofloxacin Itching, Rash      Medication List    STOP taking these medications   glucosamine-chondroitin 500-400 MG tablet     TAKE these medications   CALCIUM-VITAMIN D PO Take 1 tablet by mouth daily.   Cranberry 450 MG Caps Take 450 capsules by mouth daily.   gabapentin 300 MG capsule Commonly known as:  NEURONTIN TAKE ONE CAPSULE BY MOUTH WITH BREAKFAST AND LUNCH AND TAKE 2 CAPSULES AT BEDTIME What changed:  See the new instructions.   montelukast 10 MG tablet Commonly known as:  SINGULAIR TAKE ONE TABLET ONCE DAILY AS DIRECTED   multivitamin capsule Take 1 capsule by mouth daily.     ASK your doctor about these medications   aspirin 81 MG EC tablet TAKE 1 TABLET (81 MG TOTAL) BY MOUTH 2 (TWO) TIMES DAILY.       Diagnostic Studies: Dg Shoulder Left Port  Result Date: 04/25/2018 CLINICAL DATA:  Status post left shoulder replacement EXAM: LEFT SHOULDER - 1 VIEW COMPARISON:  None. FINDINGS: Patient is status post left shoulder replacement. Hardware is in good position. Postoperative air  noted. IMPRESSION: Left shoulder replacement as above. Electronically Signed   By: Dorise Bullion III M.D   On: 04/25/2018 12:54    Disposition:   Discharge Instructions    Call MD / Call 911   Complete by:  As directed    If you experience chest pain or shortness of breath, CALL 911 and be transported to the hospital emergency room.  If you develope a fever above 101 F, pus (white drainage) or increased drainage or redness at the wound, or calf pain, call your surgeon's office.   Constipation Prevention   Complete by:  As directed    Drink plenty of fluids.  Prune juice may be helpful.  You may use a stool softener, such as  Colace (over the counter) 100 mg twice a day.  Use MiraLax (over the counter) for constipation as needed.   Diet - low sodium heart healthy   Complete by:  As directed    Discharge instructions   Complete by:  As directed    Okay to shower.  Dressing waterproof Use CPM machine 1 hour 3 times a day.  Okay to increase degrees daily up to 90 degrees. Stay in sling for comfort for the first week. No lifting with left arm for the first week.   Increase activity slowly as tolerated   Complete by:  As directed          Signed: Anderson Malta 05/07/2018, 6:02 PM

## 2018-05-08 ENCOUNTER — Encounter (INDEPENDENT_AMBULATORY_CARE_PROVIDER_SITE_OTHER): Payer: Self-pay | Admitting: Orthopedic Surgery

## 2018-05-08 ENCOUNTER — Ambulatory Visit (INDEPENDENT_AMBULATORY_CARE_PROVIDER_SITE_OTHER): Payer: Medicare Other | Admitting: Orthopedic Surgery

## 2018-05-08 ENCOUNTER — Ambulatory Visit (INDEPENDENT_AMBULATORY_CARE_PROVIDER_SITE_OTHER): Payer: Medicare Other

## 2018-05-08 DIAGNOSIS — Z85828 Personal history of other malignant neoplasm of skin: Secondary | ICD-10-CM | POA: Diagnosis not present

## 2018-05-08 DIAGNOSIS — D225 Melanocytic nevi of trunk: Secondary | ICD-10-CM | POA: Diagnosis not present

## 2018-05-08 DIAGNOSIS — M19012 Primary osteoarthritis, left shoulder: Secondary | ICD-10-CM

## 2018-05-08 DIAGNOSIS — L821 Other seborrheic keratosis: Secondary | ICD-10-CM | POA: Diagnosis not present

## 2018-05-08 DIAGNOSIS — L814 Other melanin hyperpigmentation: Secondary | ICD-10-CM | POA: Diagnosis not present

## 2018-05-08 DIAGNOSIS — L570 Actinic keratosis: Secondary | ICD-10-CM | POA: Diagnosis not present

## 2018-05-08 NOTE — Progress Notes (Signed)
   Post-Op Visit Note   Patient: Brooke Cantrell           Date of Birth: July 20, 1946           MRN: [id number] Visit Date: 05/08/2018 PCP: Patient, No Pcp Per   Assessment & Plan:  Chief Complaint:  Chief Complaint  Patient presents with  . Left Shoulder - Routine Post Op   Visit Diagnoses:  1. Primary osteoarthritis, left shoulder     Plan: Brooke Cantrell is a patient is now 2 weeks out left reverse shoulder replacement.  She is been doing well.  On exam deltoid functions and passive range of motion is improving.  Patient's been doing CPM 3 times a day.  No pain medicines except for Tylenol.  Plan at this time is to continue CPM for 2 more weeks.  Home health physical therapy continue this week and then start outpatient physical therapy next week.  Discontinue sling.  Follow-up in 4 weeks for clinical check.  No lifting more than 10 pounds with the left arm.  Follow-Up Instructions: Return in about 4 weeks (around 06/05/2018).   Orders:  Orders Placed This Encounter  Procedures  . XR Shoulder Left   No orders of the defined types were placed in this encounter.   Imaging: Xr Shoulder Left  Result Date: 05/08/2018 AP axillary left shoulder reviewed.  Reverse shoulder replacement in good position and alignment with no complicating features.  No dislocation or fracture present   PMFS History: Patient Active Problem List   Diagnosis Date Noted  . Shoulder arthritis 04/25/2018  . 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 Chronic left shoulder pain 05/17/2017  . 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 Chronic infection of sinus 08/31/2015  . Cough 08/31/2015  . Allergic reaction 08/31/2015   Past Medical History:  Diagnosis Date  . Arthritis   . Breast cancer ([place])    Left breast  . Depression    "mild", no meds  . History of kidney stones     Family History  Problem Relation Age of Onset  . Allergic rhinitis Father   . Allergic rhinitis Sister   .  Allergic rhinitis Brother     Past Surgical History:  Procedure Laterality Date  . breast cancer surgery    . BREAST ENHANCEMENT SURGERY    . BREAST LUMPECTOMY Left   . CATARACT EXTRACTION, BILATERAL    . ECTOPIC PREGNANCY SURGERY    . EYE SURGERY    . INNER EAR SURGERY    . TONSILLECTOMY    . TOTAL SHOULDER ARTHROPLASTY Left 04/25/2018  . TOTAL SHOULDER ARTHROPLASTY Left 04/25/2018   Procedure: LEFT TOTAL SHOULDER REPLACEMENT;  Surgeon: Meredith Pel, MD;  Location: [facility];  Service: Orthopedics;  Laterality: Left;  . TUBAL LIGATION     Social History   Occupational History  . Not on file  Tobacco Use  . Smoking status: Former Smoker    Last attempt to quit: 09/11/1996    Years since quitting: 21.6  . Smokeless tobacco: Never Used  Substance and Sexual Activity  . Alcohol use: Yes    Alcohol/week: 5.0 - 7.0 standard drinks    Types: 5 - 7 Standard drinks or equivalent per week    Comment: 5-7 per week, when going out eat  . Drug use: Never  . Sexual activity: Not on file

## 2018-05-10 DIAGNOSIS — F329 Major depressive disorder, single episode, unspecified: Secondary | ICD-10-CM | POA: Diagnosis not present

## 2018-05-10 DIAGNOSIS — Z96612 Presence of left artificial shoulder joint: Secondary | ICD-10-CM | POA: Diagnosis not present

## 2018-05-10 DIAGNOSIS — Z853 Personal history of malignant neoplasm of breast: Secondary | ICD-10-CM | POA: Diagnosis not present

## 2018-05-10 DIAGNOSIS — Z471 Aftercare following joint replacement surgery: Secondary | ICD-10-CM | POA: Diagnosis not present

## 2018-05-10 DIAGNOSIS — Z87891 Personal history of nicotine dependence: Secondary | ICD-10-CM | POA: Diagnosis not present

## 2018-05-15 DIAGNOSIS — M6281 Muscle weakness (generalized): Secondary | ICD-10-CM | POA: Diagnosis not present

## 2018-05-15 DIAGNOSIS — Z96612 Presence of left artificial shoulder joint: Secondary | ICD-10-CM | POA: Diagnosis not present

## 2018-05-15 DIAGNOSIS — M25512 Pain in left shoulder: Secondary | ICD-10-CM | POA: Diagnosis not present

## 2018-05-16 DIAGNOSIS — J329 Chronic sinusitis, unspecified: Secondary | ICD-10-CM | POA: Diagnosis not present

## 2018-05-16 DIAGNOSIS — Z6828 Body mass index (BMI) 28.0-28.9, adult: Secondary | ICD-10-CM | POA: Diagnosis not present

## 2018-05-20 DIAGNOSIS — Z96612 Presence of left artificial shoulder joint: Secondary | ICD-10-CM | POA: Diagnosis not present

## 2018-05-20 DIAGNOSIS — M25512 Pain in left shoulder: Secondary | ICD-10-CM | POA: Diagnosis not present

## 2018-05-20 DIAGNOSIS — M6281 Muscle weakness (generalized): Secondary | ICD-10-CM | POA: Diagnosis not present

## 2018-05-24 DIAGNOSIS — Z96612 Presence of left artificial shoulder joint: Secondary | ICD-10-CM | POA: Diagnosis not present

## 2018-05-24 DIAGNOSIS — M25512 Pain in left shoulder: Secondary | ICD-10-CM | POA: Diagnosis not present

## 2018-05-24 DIAGNOSIS — M6281 Muscle weakness (generalized): Secondary | ICD-10-CM | POA: Diagnosis not present

## 2018-05-27 DIAGNOSIS — Z96612 Presence of left artificial shoulder joint: Secondary | ICD-10-CM | POA: Diagnosis not present

## 2018-05-27 DIAGNOSIS — M6281 Muscle weakness (generalized): Secondary | ICD-10-CM | POA: Diagnosis not present

## 2018-05-27 DIAGNOSIS — M25512 Pain in left shoulder: Secondary | ICD-10-CM | POA: Diagnosis not present

## 2018-05-30 DIAGNOSIS — M6281 Muscle weakness (generalized): Secondary | ICD-10-CM | POA: Diagnosis not present

## 2018-05-30 DIAGNOSIS — Z96612 Presence of left artificial shoulder joint: Secondary | ICD-10-CM | POA: Diagnosis not present

## 2018-05-30 DIAGNOSIS — M25512 Pain in left shoulder: Secondary | ICD-10-CM | POA: Diagnosis not present

## 2018-05-30 DIAGNOSIS — Z79899 Other long term (current) drug therapy: Secondary | ICD-10-CM | POA: Diagnosis not present

## 2018-06-03 ENCOUNTER — Encounter (INDEPENDENT_AMBULATORY_CARE_PROVIDER_SITE_OTHER): Payer: Self-pay | Admitting: Orthopedic Surgery

## 2018-06-03 ENCOUNTER — Ambulatory Visit (INDEPENDENT_AMBULATORY_CARE_PROVIDER_SITE_OTHER): Payer: Medicare Other | Admitting: Orthopedic Surgery

## 2018-06-03 DIAGNOSIS — M25512 Pain in left shoulder: Secondary | ICD-10-CM | POA: Diagnosis not present

## 2018-06-03 DIAGNOSIS — Z96612 Presence of left artificial shoulder joint: Secondary | ICD-10-CM | POA: Diagnosis not present

## 2018-06-03 DIAGNOSIS — M6281 Muscle weakness (generalized): Secondary | ICD-10-CM | POA: Diagnosis not present

## 2018-06-03 DIAGNOSIS — M19012 Primary osteoarthritis, left shoulder: Secondary | ICD-10-CM

## 2018-06-04 ENCOUNTER — Encounter (INDEPENDENT_AMBULATORY_CARE_PROVIDER_SITE_OTHER): Payer: Self-pay | Admitting: Orthopedic Surgery

## 2018-06-04 NOTE — Progress Notes (Signed)
   Post-Op Visit Note   Patient: Brooke Cantrell           Date of Birth: [birth year]           MRN: [id number] Visit Date: 06/03/2018 PCP: Patient, No Pcp Per   Assessment & Plan:  Chief Complaint:  Chief Complaint  Patient presents with  . Left Shoulder - Routine Post Op   Visit Diagnoses:  1. Primary osteoarthritis, left shoulder     Plan: Zosia is a patient who is now about 5 weeks out left shoulder replacement.  She is doing well.  Going to therapy twice a week.  No medication for pain since the first week of surgery.  On examination she is reporting a suture at the superior aspect of the incision.  No redness or infection.  She does have a suture which has not quite dissolved and I pulled that out today.  Range of motion wise she has good forward flexion abduction above 90 degrees.  Cuff strength is good.  Cannot really get quite behind her back is much she would like.  Plan: Although for 5 weeks out she looks very good.  I will see her back in 7 weeks for final check.  Follow-Up Instructions: Return in about 7 weeks (around 07/22/2018).   Orders:  No orders of the defined types were placed in this encounter.  No orders of the defined types were placed in this encounter.   Imaging: No results found.  PMFS History: Patient Active Problem List   Diagnosis Date Noted  . Shoulder arthritis 04/25/2018  . 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 Chronic left shoulder pain 05/17/2017  . 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 Chronic infection of sinus 08/31/2015  . Cough 08/31/2015  . Allergic reaction 08/31/2015   Past Medical History:  Diagnosis Date  . Arthritis   . Breast cancer ([place])    Left breast  . Depression    "mild", no meds  . History of kidney stones     Family History  Problem Relation Age of Onset  . Allergic rhinitis Father   . Allergic rhinitis Sister   . Allergic rhinitis Brother     Past Surgical History:  Procedure  Laterality Date  . breast cancer surgery    . BREAST ENHANCEMENT SURGERY    . BREAST LUMPECTOMY Left   . CATARACT EXTRACTION, BILATERAL    . ECTOPIC PREGNANCY SURGERY    . EYE SURGERY    . INNER EAR SURGERY    . TONSILLECTOMY    . TOTAL SHOULDER ARTHROPLASTY Left 04/25/2018  . TOTAL SHOULDER ARTHROPLASTY Left 04/25/2018   Procedure: LEFT TOTAL SHOULDER REPLACEMENT;  Surgeon: Meredith Pel, MD;  Location: [facility];  Service: Orthopedics;  Laterality: Left;  . TUBAL LIGATION     Social History   Occupational History  . Not on file  Tobacco Use  . Smoking status: Former Smoker    Last attempt to quit: 09/11/1996    Years since quitting: 21.7  . Smokeless tobacco: Never Used  Substance and Sexual Activity  . Alcohol use: Yes    Alcohol/week: 5.0 - 7.0 standard drinks    Types: 5 - 7 Standard drinks or equivalent per week    Comment: 5-7 per week, when going out eat  . Drug use: Never  . Sexual activity: Not on file

## 2018-06-06 DIAGNOSIS — M6281 Muscle weakness (generalized): Secondary | ICD-10-CM | POA: Diagnosis not present

## 2018-06-06 DIAGNOSIS — Z96612 Presence of left artificial shoulder joint: Secondary | ICD-10-CM | POA: Diagnosis not present

## 2018-06-06 DIAGNOSIS — M25512 Pain in left shoulder: Secondary | ICD-10-CM | POA: Diagnosis not present

## 2018-06-07 DIAGNOSIS — M8580 Other specified disorders of bone density and structure, unspecified site: Secondary | ICD-10-CM | POA: Diagnosis not present

## 2018-06-10 DIAGNOSIS — M25512 Pain in left shoulder: Secondary | ICD-10-CM | POA: Diagnosis not present

## 2018-06-10 DIAGNOSIS — Z96612 Presence of left artificial shoulder joint: Secondary | ICD-10-CM | POA: Diagnosis not present

## 2018-06-10 DIAGNOSIS — M6281 Muscle weakness (generalized): Secondary | ICD-10-CM | POA: Diagnosis not present

## 2018-06-12 DIAGNOSIS — M6281 Muscle weakness (generalized): Secondary | ICD-10-CM | POA: Diagnosis not present

## 2018-06-12 DIAGNOSIS — Z96612 Presence of left artificial shoulder joint: Secondary | ICD-10-CM | POA: Diagnosis not present

## 2018-06-12 DIAGNOSIS — M25512 Pain in left shoulder: Secondary | ICD-10-CM | POA: Diagnosis not present

## 2018-06-18 DIAGNOSIS — M25512 Pain in left shoulder: Secondary | ICD-10-CM | POA: Diagnosis not present

## 2018-06-18 DIAGNOSIS — Z96612 Presence of left artificial shoulder joint: Secondary | ICD-10-CM | POA: Diagnosis not present

## 2018-06-18 DIAGNOSIS — M6281 Muscle weakness (generalized): Secondary | ICD-10-CM | POA: Diagnosis not present

## 2018-06-26 DIAGNOSIS — M25512 Pain in left shoulder: Secondary | ICD-10-CM | POA: Diagnosis not present

## 2018-06-26 DIAGNOSIS — Z96612 Presence of left artificial shoulder joint: Secondary | ICD-10-CM | POA: Diagnosis not present

## 2018-06-26 DIAGNOSIS — M6281 Muscle weakness (generalized): Secondary | ICD-10-CM | POA: Diagnosis not present

## 2018-06-28 DIAGNOSIS — Z96612 Presence of left artificial shoulder joint: Secondary | ICD-10-CM | POA: Diagnosis not present

## 2018-06-28 DIAGNOSIS — M6281 Muscle weakness (generalized): Secondary | ICD-10-CM | POA: Diagnosis not present

## 2018-06-28 DIAGNOSIS — M25512 Pain in left shoulder: Secondary | ICD-10-CM | POA: Diagnosis not present

## 2018-07-04 DIAGNOSIS — Z96612 Presence of left artificial shoulder joint: Secondary | ICD-10-CM | POA: Diagnosis not present

## 2018-07-04 DIAGNOSIS — M6281 Muscle weakness (generalized): Secondary | ICD-10-CM | POA: Diagnosis not present

## 2018-07-04 DIAGNOSIS — M25512 Pain in left shoulder: Secondary | ICD-10-CM | POA: Diagnosis not present

## 2018-07-22 ENCOUNTER — Encounter (INDEPENDENT_AMBULATORY_CARE_PROVIDER_SITE_OTHER): Payer: Self-pay | Admitting: Orthopedic Surgery

## 2018-07-22 ENCOUNTER — Ambulatory Visit (INDEPENDENT_AMBULATORY_CARE_PROVIDER_SITE_OTHER): Payer: Medicare Other | Admitting: Orthopedic Surgery

## 2018-07-22 ENCOUNTER — Telehealth (INDEPENDENT_AMBULATORY_CARE_PROVIDER_SITE_OTHER): Payer: Self-pay | Admitting: Orthopedic Surgery

## 2018-07-22 DIAGNOSIS — Z96612 Presence of left artificial shoulder joint: Secondary | ICD-10-CM | POA: Diagnosis not present

## 2018-07-22 DIAGNOSIS — M25512 Pain in left shoulder: Secondary | ICD-10-CM | POA: Diagnosis not present

## 2018-07-22 DIAGNOSIS — M19012 Primary osteoarthritis, left shoulder: Secondary | ICD-10-CM

## 2018-07-22 DIAGNOSIS — M6281 Muscle weakness (generalized): Secondary | ICD-10-CM | POA: Diagnosis not present

## 2018-07-22 NOTE — Telephone Encounter (Signed)
Please advise. Thanks.  

## 2018-07-22 NOTE — Progress Notes (Signed)
   Post-Op Visit Note   Patient: Brooke Cantrell           Date of Birth: [birth year]           MRN: [id number] Visit Date: 07/22/2018 PCP: Patient, No Pcp Per   Assessment & Plan:  Chief Complaint:  Chief Complaint  Patient presents with  . Left Shoulder - Follow-up   Visit Diagnoses:  1. Primary osteoarthritis, left shoulder     Plan: Brooke Cantrell is a patient who is now about 3 months out left total shoulder reverse replacement.  She is doing well.  On exam she has forward flexion and abduction both well above 90 degrees.  Strength is also good.  She is going to [place] for the next 4 months.  She is been released by physical therapy to home exercise program.  Plan at this time is that he is doing well from her shoulder replacement.  She is able to put deodorant on under her right arm.  I will see her back as needed.  I do want her to limit her lifting to less than or equal to 15 pounds for at least 3 more months to assure bony ingrowth into both the glenoid and humeral stem.  Follow-Up Instructions: Return if symptoms worsen or fail to improve.   Orders:  No orders of the defined types were placed in this encounter.  No orders of the defined types were placed in this encounter.   Imaging: No results found.  PMFS History: Patient Active Problem List   Diagnosis Date Noted  . Shoulder arthritis 04/25/2018  . 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 Chronic left shoulder pain 05/17/2017  . 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 Chronic infection of sinus 08/31/2015  . Cough 08/31/2015  . Allergic reaction 08/31/2015   Past Medical History:  Diagnosis Date  . Arthritis   . Breast cancer ([place])    Left breast  . Depression    "mild", no meds  . History of kidney stones     Family History  Problem Relation Age of Onset  . Allergic rhinitis Father   . Allergic rhinitis Sister   . Allergic rhinitis Brother     Past Surgical History:  Procedure  Laterality Date  . breast cancer surgery    . BREAST ENHANCEMENT SURGERY    . BREAST LUMPECTOMY Left   . CATARACT EXTRACTION, BILATERAL    . ECTOPIC PREGNANCY SURGERY    . EYE SURGERY    . INNER EAR SURGERY    . TONSILLECTOMY    . TOTAL SHOULDER ARTHROPLASTY Left 04/25/2018  . TOTAL SHOULDER ARTHROPLASTY Left 04/25/2018   Procedure: LEFT TOTAL SHOULDER REPLACEMENT;  Surgeon: Meredith Pel, MD;  Location: [facility];  Service: Orthopedics;  Laterality: Left;  . TUBAL LIGATION     Social History   Occupational History  . Not on file  Tobacco Use  . Smoking status: Former Smoker    Last attempt to quit: 09/11/1996    Years since quitting: 21.8  . Smokeless tobacco: Never Used  Substance and Sexual Activity  . Alcohol use: Yes    Alcohol/week: 5.0 - 7.0 standard drinks    Types: 5 - 7 Standard drinks or equivalent per week    Comment: 5-7 per week, when going out eat  . Drug use: Never  . Sexual activity: Not on file

## 2018-07-22 NOTE — Telephone Encounter (Signed)
y

## 2018-07-22 NOTE — Telephone Encounter (Signed)
Patient called left voicemail message asking if she can go to the spa and have a massage? Patient said the spa need to know if it's ok.  The number to contact patient is [phone]

## 2018-07-23 NOTE — Telephone Encounter (Signed)
IC s/w patient and advised per Dr Dean.  

## 2018-07-29 ENCOUNTER — Telehealth (INDEPENDENT_AMBULATORY_CARE_PROVIDER_SITE_OTHER): Payer: Self-pay

## 2018-07-29 NOTE — Telephone Encounter (Signed)
Received fax from patients dentist asking about pre-med prior to dental procedure. Completed and faxed back to them advising per Dr Marlou Sa, does need pre med prior to dental procedure.

## 2018-12-12 DIAGNOSIS — Z79899 Other long term (current) drug therapy: Secondary | ICD-10-CM | POA: Diagnosis not present

## 2018-12-26 DIAGNOSIS — M8580 Other specified disorders of bone density and structure, unspecified site: Secondary | ICD-10-CM | POA: Diagnosis not present

## 2019-01-30 DIAGNOSIS — Z79899 Other long term (current) drug therapy: Secondary | ICD-10-CM | POA: Diagnosis not present

## 2019-01-30 DIAGNOSIS — Z136 Encounter for screening for cardiovascular disorders: Secondary | ICD-10-CM | POA: Diagnosis not present

## 2019-01-30 DIAGNOSIS — Z0000 Encounter for general adult medical examination without abnormal findings: Secondary | ICD-10-CM | POA: Diagnosis not present

## 2019-01-30 DIAGNOSIS — Z9181 History of falling: Secondary | ICD-10-CM | POA: Diagnosis not present

## 2019-01-30 DIAGNOSIS — Z1331 Encounter for screening for depression: Secondary | ICD-10-CM | POA: Diagnosis not present

## 2019-02-04 DIAGNOSIS — E663 Overweight: Secondary | ICD-10-CM | POA: Diagnosis not present

## 2019-02-04 DIAGNOSIS — Z6828 Body mass index (BMI) 28.0-28.9, adult: Secondary | ICD-10-CM | POA: Diagnosis not present

## 2019-02-04 DIAGNOSIS — Z86010 Personal history of colonic polyps: Secondary | ICD-10-CM | POA: Diagnosis not present

## 2019-02-04 DIAGNOSIS — M8580 Other specified disorders of bone density and structure, unspecified site: Secondary | ICD-10-CM | POA: Diagnosis not present

## 2019-03-26 DIAGNOSIS — Z961 Presence of intraocular lens: Secondary | ICD-10-CM | POA: Diagnosis not present

## 2019-03-26 DIAGNOSIS — H43813 Vitreous degeneration, bilateral: Secondary | ICD-10-CM | POA: Diagnosis not present

## 2019-03-26 DIAGNOSIS — H43393 Other vitreous opacities, bilateral: Secondary | ICD-10-CM | POA: Diagnosis not present

## 2019-04-02 ENCOUNTER — Other Ambulatory Visit: Payer: Self-pay

## 2019-04-02 ENCOUNTER — Encounter: Payer: Self-pay | Admitting: Emergency Medicine

## 2019-04-02 ENCOUNTER — Observation Stay
Admission: EM | Admit: 2019-04-02 | Discharge: 2019-04-03 | Disposition: A | Discharge status: Home / Self Care | Payer: Medicare Other | Attending: Internal Medicine | Admitting: Internal Medicine

## 2019-04-02 ENCOUNTER — Emergency Department: Payer: Medicare Other

## 2019-04-02 DIAGNOSIS — Z9882 Breast implant status: Secondary | ICD-10-CM | POA: Diagnosis not present

## 2019-04-02 DIAGNOSIS — Z87442 Personal history of urinary calculi: Secondary | ICD-10-CM | POA: Insufficient documentation

## 2019-04-02 DIAGNOSIS — Z1159 Encounter for screening for other viral diseases: Secondary | ICD-10-CM | POA: Insufficient documentation

## 2019-04-02 DIAGNOSIS — Z87891 Personal history of nicotine dependence: Secondary | ICD-10-CM | POA: Diagnosis not present

## 2019-04-02 DIAGNOSIS — D72829 Elevated white blood cell count, unspecified: Secondary | ICD-10-CM | POA: Insufficient documentation

## 2019-04-02 DIAGNOSIS — Z853 Personal history of malignant neoplasm of breast: Secondary | ICD-10-CM | POA: Insufficient documentation

## 2019-04-02 DIAGNOSIS — R918 Other nonspecific abnormal finding of lung field: Secondary | ICD-10-CM | POA: Diagnosis not present

## 2019-04-02 DIAGNOSIS — A419 Sepsis, unspecified organism: Principal | ICD-10-CM | POA: Insufficient documentation

## 2019-04-02 DIAGNOSIS — J189 Pneumonia, unspecified organism: Secondary | ICD-10-CM | POA: Diagnosis not present

## 2019-04-02 DIAGNOSIS — Z7982 Long term (current) use of aspirin: Secondary | ICD-10-CM | POA: Diagnosis not present

## 2019-04-02 DIAGNOSIS — M1990 Unspecified osteoarthritis, unspecified site: Secondary | ICD-10-CM | POA: Insufficient documentation

## 2019-04-02 DIAGNOSIS — F329 Major depressive disorder, single episode, unspecified: Secondary | ICD-10-CM | POA: Insufficient documentation

## 2019-04-02 DIAGNOSIS — R531 Weakness: Secondary | ICD-10-CM | POA: Insufficient documentation

## 2019-04-02 DIAGNOSIS — R7989 Other specified abnormal findings of blood chemistry: Secondary | ICD-10-CM | POA: Diagnosis not present

## 2019-04-02 DIAGNOSIS — Z96612 Presence of left artificial shoulder joint: Secondary | ICD-10-CM | POA: Insufficient documentation

## 2019-04-02 DIAGNOSIS — R0602 Shortness of breath: Secondary | ICD-10-CM | POA: Diagnosis not present

## 2019-04-02 DIAGNOSIS — I447 Left bundle-branch block, unspecified: Secondary | ICD-10-CM | POA: Diagnosis not present

## 2019-04-02 DIAGNOSIS — Z79899 Other long term (current) drug therapy: Secondary | ICD-10-CM | POA: Insufficient documentation

## 2019-04-02 DIAGNOSIS — R079 Chest pain, unspecified: Secondary | ICD-10-CM | POA: Diagnosis not present

## 2019-04-02 DIAGNOSIS — Z20828 Contact with and (suspected) exposure to other viral communicable diseases: Secondary | ICD-10-CM | POA: Diagnosis not present

## 2019-04-02 DIAGNOSIS — R778 Other specified abnormalities of plasma proteins: Secondary | ICD-10-CM | POA: Diagnosis present

## 2019-04-02 DIAGNOSIS — R05 Cough: Secondary | ICD-10-CM | POA: Diagnosis not present

## 2019-04-02 LAB — COMPREHENSIVE METABOLIC PANEL
ALT: 18 U/L (ref 0–44)
AST: 18 U/L (ref 15–41)
Albumin: 3.7 g/dL (ref 3.5–5.0)
Alkaline Phosphatase: 49 U/L (ref 38–126)
Anion gap: 10 (ref 5–15)
BUN: 14 mg/dL (ref 8–23)
CO2: 21 mmol/L — ABNORMAL LOW (ref 22–32)
Calcium: 8.3 mg/dL — ABNORMAL LOW (ref 8.9–10.3)
Chloride: 105 mmol/L (ref 98–111)
Creatinine, Ser: 0.69 mg/dL (ref 0.44–1.00)
GFR calc Af Amer: >60 mL/min (ref >60)
GFR calc non Af Amer: >60 mL/min (ref >60)
Glucose, Bld: 121 mg/dL — ABNORMAL HIGH (ref 70–99)
Potassium: 3.8 mmol/L (ref 3.5–5.1)
Sodium: 136 mmol/L (ref 135–145)
Total Bilirubin: 1.8 mg/dL — ABNORMAL HIGH (ref 0.3–1.2)
Total Protein: 7.6 g/dL (ref 6.5–8.1)

## 2019-04-02 LAB — CBC WITH DIFFERENTIAL/PLATELET
Abs Immature Granulocytes: 0.06 10*3/uL (ref 0.00–0.07)
Basophils Absolute: 0 10*3/uL (ref 0.0–0.1)
Basophils Relative: 0 %
Eosinophils Absolute: 0.5 10*3/uL (ref 0.0–0.5)
Eosinophils Relative: 4 %
HCT: 39.9 % (ref 36.0–46.0)
Hemoglobin: 13.7 g/dL (ref 12.0–15.0)
Immature Granulocytes: 1 %
Lymphocytes Relative: 16 %
Lymphs Abs: 2 10*3/uL (ref 0.7–4.0)
MCH: 31.1 pg (ref 26.0–34.0)
MCHC: 34.3 g/dL (ref 30.0–36.0)
MCV: 90.7 fL (ref 80.0–100.0)
Monocytes Absolute: 1.6 10*3/uL — ABNORMAL HIGH (ref 0.1–1.0)
Monocytes Relative: 13 %
Neutro Abs: 8.4 10*3/uL — ABNORMAL HIGH (ref 1.7–7.7)
Neutrophils Relative %: 66 %
Platelets: 245 10*3/uL (ref 150–400)
RBC: 4.4 MIL/uL (ref 3.87–5.11)
RDW: 12.8 % (ref 11.5–15.5)
WBC: 12.6 10*3/uL — ABNORMAL HIGH (ref 4.0–10.5)
nRBC: 0 % (ref 0.0–0.2)

## 2019-04-02 LAB — URINALYSIS, COMPLETE (UACMP) WITH MICROSCOPIC
Bacteria, UA: NONE SEEN
Bilirubin Urine: NEGATIVE
Glucose, UA: NEGATIVE mg/dL
Hgb urine dipstick: NEGATIVE
Ketones, ur: 20 mg/dL — AB
Leukocytes,Ua: NEGATIVE
Nitrite: NEGATIVE
Protein, ur: NEGATIVE mg/dL
Specific Gravity, Urine: 1.021 (ref 1.005–1.030)
pH: 5 (ref 5.0–8.0)

## 2019-04-02 LAB — PROTIME-INR
INR: 1.2 (ref 0.8–1.2)
Prothrombin Time: 14.6 seconds (ref 11.4–15.2)

## 2019-04-02 LAB — LACTIC ACID, PLASMA: Lactic Acid, Venous: 1.1 mmol/L (ref 0.5–1.9)

## 2019-04-02 LAB — TROPONIN I (HIGH SENSITIVITY): Troponin I (High Sensitivity): 23 ng/L — ABNORMAL HIGH (ref <18)

## 2019-04-02 LAB — SARS CORONAVIRUS 2 BY RT PCR (HOSPITAL ORDER, PERFORMED IN ~~LOC~~ HOSPITAL LAB): SARS Coronavirus 2: NEGATIVE

## 2019-04-02 MED ORDER — MORPHINE SULFATE (PF) 4 MG/ML IV SOLN
4.0000 mg | Freq: Once | INTRAVENOUS | Status: DC
  Filled 2019-04-02: qty 1

## 2019-04-02 MED ORDER — ASPIRIN 81 MG PO CHEW
324.0000 mg | CHEWABLE_TABLET | Freq: Once | ORAL | Status: AC
  Administered 2019-04-02: 324 mg via ORAL
  Filled 2019-04-02: qty 4

## 2019-04-02 MED ORDER — LEVOFLOXACIN IN D5W 750 MG/150ML IV SOLN
750.0000 mg | Freq: Once | INTRAVENOUS | Status: AC
  Administered 2019-04-02: 750 mg via INTRAVENOUS
  Filled 2019-04-02: qty 150

## 2019-04-02 MED ORDER — IOHEXOL 350 MG/ML SOLN
75.0000 mL | Freq: Once | INTRAVENOUS | Status: AC | PRN
  Administered 2019-04-02: 20:00:00 75 mL via INTRAVENOUS

## 2019-04-02 MED ORDER — OXYCODONE HCL 5 MG PO TABS: 5.0000 mg | ORAL_TABLET | Freq: Four times a day (QID) | ORAL | Status: DC | PRN

## 2019-04-02 MED ORDER — ONDANSETRON HCL 4 MG/2ML IJ SOLN
4.0000 mg | Freq: Once | INTRAMUSCULAR | Status: AC
  Administered 2019-04-02: 4 mg via INTRAVENOUS
  Filled 2019-04-02: qty 2

## 2019-04-02 MED ORDER — KETOROLAC TROMETHAMINE 30 MG/ML IJ SOLN
15.0000 mg | Freq: Once | INTRAMUSCULAR | Status: AC
  Administered 2019-04-02: 15 mg via INTRAVENOUS
  Filled 2019-04-02: qty 1

## 2019-04-02 MED ORDER — DIPHENHYDRAMINE HCL 50 MG/ML IJ SOLN
12.5000 mg | Freq: Once | INTRAMUSCULAR | Status: AC
  Administered 2019-04-02: 12.5 mg via INTRAVENOUS
  Filled 2019-04-02: qty 1

## 2019-04-02 NOTE — ED Notes (Signed)
ED TO INPATIENT HANDOFF REPORT  ED Nurse Name and Phone #: [phone]   S Name/Age/Gender Brooke Cantrell 73 y.o. female Room/Bed: ED25A/ED25A  Code Status   Code Status: Prior  Home/SNF/Other Home Patient oriented to: self, place, time and situation Is this baseline? Yes   Triage Complete: Triage complete  Chief Complaint referred by KC/Shob  Triage Note Pt here for dry cough, generalized body pain and weakness, and SHOB.  Reports low grade temps at home.  Tachypnea noted but unlabored.  Has had some incontinence because unable to get to bathroom in time.  NAD at this time.     Allergies Allergies  Allergen Reactions  . Ciprofloxacin Itching and Rash    Level of Care/Admitting Diagnosis ED Disposition    ED Disposition Condition [facility] Area: [place] [[id number]]  Level of Care: Med-Surg [16]  Covid Evaluation: Confirmed COVID Negative  Diagnosis: Sepsis [facility]) [[id number]]  Admitting Physician: Lance Coon [[id number]]  Attending Physician: Lance Coon [phone]  Estimated length of stay: past midnight tomorrow  Certification:: I certify this patient will need inpatient services for at least 2 midnights  PT Class (Do Not Modify): Inpatient [101]  PT Acc Code (Do Not Modify): Private [1]       B Medical/Surgery History Past Medical History:  Diagnosis Date  . Arthritis   . Breast cancer ([place])    Left breast  . Depression    "mild", no meds  . History of kidney stones    Past Surgical History:  Procedure Laterality Date  . breast cancer surgery    . BREAST ENHANCEMENT SURGERY    . BREAST LUMPECTOMY Left   . CATARACT EXTRACTION, BILATERAL    . ECTOPIC PREGNANCY SURGERY    . EYE SURGERY    . INNER EAR SURGERY    . TONSILLECTOMY    . TOTAL SHOULDER ARTHROPLASTY Left 04/25/2018  . TOTAL SHOULDER ARTHROPLASTY Left 04/25/2018   Procedure: LEFT TOTAL SHOULDER REPLACEMENT;  Surgeon: Meredith Pel, MD;  Location: [place];   Service: Orthopedics;  Laterality: Left;  . TUBAL LIGATION       A IV Location/Drains/Wounds Patient Lines/Drains/Airways Status   Active Line/Drains/Airways    Name:   Placement date:   Placement time:   Site:   Days:   Peripheral IV 04/02/19 Right Antecubital   04/02/19    1608    Antecubital   less than 1   Incision (Closed) 04/25/18 Shoulder Left   04/25/18    1154     342          Intake/Output Last 24 hours No intake or output data in the 24 hours ending 04/02/19 2259  Labs/Imaging Results for orders placed or performed during the hospital encounter of 04/02/19 (from the past 48 hour(s))  Urinalysis, Complete w Microscopic     Status: Abnormal   Collection Time: 04/02/19  4:12 PM  Result Value Ref Range   Color, Urine YELLOW (A) YELLOW   APPearance CLEAR (A) CLEAR   Specific Gravity, Urine 1.021 1.005 - 1.030   pH 5.0 5.0 - 8.0   Glucose, UA NEGATIVE NEGATIVE mg/dL   Hgb urine dipstick NEGATIVE NEGATIVE   Bilirubin Urine NEGATIVE NEGATIVE   Ketones, ur 20 (A) NEGATIVE mg/dL   Protein, ur NEGATIVE NEGATIVE mg/dL   Nitrite NEGATIVE NEGATIVE   Leukocytes,Ua NEGATIVE NEGATIVE   RBC / HPF 6-10 0 - 5 RBC/hpf   WBC, UA 0-5 0 - 5  WBC/hpf   Bacteria, UA NONE SEEN NONE SEEN   Squamous Epithelial / LPF 0-5 0 - 5   Mucus PRESENT     Comment: Performed at [facility], [address] [place], [place] [address]  Comprehensive metabolic panel     Status: Abnormal   Collection Time: 04/02/19  4:12 PM  Result Value Ref Range   Sodium 136 135 - 145 mmol/L   Potassium 3.8 3.5 - 5.1 mmol/L   Chloride 105 98 - 111 mmol/L   CO2 21 (L) 22 - 32 mmol/L   Glucose, Bld 121 (H) 70 - 99 mg/dL   BUN 14 8 - 23 mg/dL   Creatinine, Ser 0.69 0.44 - 1.00 mg/dL   Calcium 8.3 (L) 8.9 - 10.3 mg/dL   Total Protein 7.6 6.5 - 8.1 g/dL   Albumin 3.7 3.5 - 5.0 g/dL   AST 18 15 - 41 U/L   ALT 18 0 - 44 U/L   Alkaline Phosphatase 49 38 - 126 U/L   Total Bilirubin 1.8 (H) 0.3 - 1.2 mg/dL    GFR calc non Af Amer >60 >60 mL/min   GFR calc Af Amer >60 >60 mL/min   Anion gap 10 5 - 15    Comment: Performed at [facility], [address] [place], [place] [address]  Lactic acid, plasma     Status: None   Collection Time: 04/02/19  4:12 PM  Result Value Ref Range   Lactic Acid, Venous 1.1 0.5 - 1.9 mmol/L    Comment: Performed at [facility], [address] [place], [place] [address]  CBC with Differential     Status: Abnormal   Collection Time: 04/02/19  4:12 PM  Result Value Ref Range   WBC 12.6 (H) 4.0 - 10.5 K/uL   RBC 4.40 3.87 - 5.11 MIL/uL   Hemoglobin 13.7 12.0 - 15.0 g/dL   HCT 39.9 36.0 - 46.0 %   MCV 90.7 80.0 - 100.0 fL   MCH 31.1 26.0 - 34.0 pg   MCHC 34.3 30.0 - 36.0 g/dL   RDW 12.8 11.5 - 15.5 %   Platelets 245 150 - 400 K/uL   nRBC 0.0 0.0 - 0.2 %   Neutrophils Relative % 66 %   Neutro Abs 8.4 (H) 1.7 - 7.7 K/uL   Lymphocytes Relative 16 %   Lymphs Abs 2.0 0.7 - 4.0 K/uL   Monocytes Relative 13 %   Monocytes Absolute 1.6 (H) 0.1 - 1.0 K/uL   Eosinophils Relative 4 %   Eosinophils Absolute 0.5 0.0 - 0.5 K/uL   Basophils Relative 0 %   Basophils Absolute 0.0 0.0 - 0.1 K/uL   Immature Granulocytes 1 %   Abs Immature Granulocytes 0.06 0.00 - 0.07 K/uL    Comment: Performed at [facility], [address] [place], [place] [address]  Protime-INR     Status: None   Collection Time: 04/02/19  4:12 PM  Result Value Ref Range   Prothrombin Time 14.6 11.4 - 15.2 seconds   INR 1.2 0.8 - 1.2    Comment: (NOTE) INR goal varies based on device and disease states. Performed at [facility], [place], [place] [address]   Troponin I (High Sensitivity)     Status: Abnormal   Collection Time: 04/02/19  4:12 PM  Result Value Ref Range   Troponin I (High Sensitivity) 23 (H) <18 ng/L    Comment: (NOTE) Elevated high sensitivity troponin I (hsTnI)  values and significant  changes across serial measurements  may suggest ACS but many other  chronic and acute conditions are known to elevate hsTnI results.  Refer to the "Links" section for chest pain algorithms and additional  guidance. Performed at [facility], [address], [place], [place] [address]   SARS Coronavirus 2 (CEPHEID- Performed in [facility] hospital lab), Hosp Order     Status: None   Collection Time: 04/02/19  7:08 PM   Specimen: Nasopharyngeal Swab  Result Value Ref Range   SARS Coronavirus 2 NEGATIVE NEGATIVE    Comment: (NOTE) If result is NEGATIVE SARS-CoV-2 target nucleic acids are NOT DETECTED. The SARS-CoV-2 RNA is generally detectable in upper and lower  respiratory specimens during the acute phase of infection. The lowest  concentration of SARS-CoV-2 viral copies this assay can detect is 250  copies / mL. A negative result does not preclude SARS-CoV-2 infection  and should not be used as the sole basis for treatment or other  patient management decisions.  A negative result may occur with  improper specimen collection / handling, submission of specimen other  than nasopharyngeal swab, presence of viral mutation(s) within the  areas targeted by this assay, and inadequate number of viral copies  (<250 copies / mL). A negative result must be combined with clinical  observations, patient history, and epidemiological information. If result is POSITIVE SARS-CoV-2 target nucleic acids are DETECTED. The SARS-CoV-2 RNA is generally detectable in upper and lower  respiratory specimens dur ing the acute phase of infection.  Positive  results are indicative of active infection with SARS-CoV-2.  Clinical  correlation with patient history and other diagnostic information is  necessary to determine patient infection status.  Positive results do  not rule out bacterial infection or co-infection with other viruses. If result is PRESUMPTIVE POSTIVE SARS-CoV-2 nucleic acids MAY BE PRESENT.   A presumptive positive  result was obtained on the submitted specimen  and confirmed on repeat testing.  While 2019 novel coronavirus  (SARS-CoV-2) nucleic acids may be present in the submitted sample  additional confirmatory testing may be necessary for epidemiological  and / or clinical management purposes  to differentiate between  SARS-CoV-2 and other Sarbecovirus currently known to infect humans.  If clinically indicated additional testing with an alternate test  methodology [phone]) is advised. The SARS-CoV-2 RNA is generally  detectable in upper and lower respiratory sp ecimens during the acute  phase of infection. The expected result is Negative. Fact Sheet for Patients:  StrictlyIdeas.no Fact Sheet for Healthcare Providers: BankingDealers.co.za This test is not yet approved or cleared by the [place] FDA and has been authorized for detection and/or diagnosis of SARS-CoV-2 by FDA under an Emergency Use Authorization (EUA).  This EUA will remain in effect (meaning this test can be used) for the duration of the COVID-19 declaration under Section 564(b)(1) of the Act, 21 U.S.C. section 360bbb-3(b)(1), unless the authorization is terminated or revoked sooner. Performed at [facility], [address] [place], [place] [address]    Dg Chest 2 View  Result Date: 04/02/2019 CLINICAL DATA:  Dry cough, low-grade fever, generalized body pain and weakness, LEFT side chest pain, shortness of breath EXAM: CHEST - 2 VIEW COMPARISON:  08/31/2015 FINDINGS: Normal heart size, mediastinal contours, and pulmonary vascularity. Lungs hyperinflated but clear. No pulmonary infiltrate, pleural effusion or pneumothorax. Bones demineralized with note of a reverse LEFT shoulder arthroplasty. Capsular calcification at BILATERAL breast prostheses. IMPRESSION: Hyperinflated lungs without acute abnormalities.  Electronically Signed   By: Lavonia Dana M.D.   On: 04/02/2019  18:30   Ct Angio Chest Pe W And/or Wo Contrast  Result Date: 04/02/2019 CLINICAL DATA:  Dry cough and weakness with shortness of breath EXAM: CT ANGIOGRAPHY CHEST WITH CONTRAST TECHNIQUE: Multidetector CT imaging of the chest was performed using the standard protocol during bolus administration of intravenous contrast. Multiplanar CT image reconstructions and MIPs were obtained to evaluate the vascular anatomy. CONTRAST:  75mL OMNIPAQUE IOHEXOL 350 MG/ML SOLN COMPARISON:  None. FINDINGS: Cardiovascular: Thoracic aorta demonstrates mild atherosclerotic calcifications. No aneurysmal dilatation is seen. No cardiac enlargement is noted. The pulmonary artery shows a normal branching pattern. No definitive filling defects to suggest pulmonary emboli are identified. Mediastinum/Nodes: Thoracic inlet is within normal limits. No sizable mediastinal adenopathy is identified. Mild reactive lymph nodes are noted within the left hilum secondary to the below mention infiltrate. The esophagus as visualized is within normal limits. Lungs/Pleura: Lungs are well aerated bilaterally. Focal infiltrate is noted in the lingula extending back to the left hilum. Some reactive small lymph nodes are seen. No sizable effusion is noted. A centrally calcified parenchymal nodule is noted in the right lower lobe consistent with a small granuloma. Upper Abdomen: Visualized upper abdomen demonstrates hypodensities within the liver likely representing cysts. These are stable by report from 2007. Musculoskeletal: Degenerative changes of the thoracic spine are noted. Calcified bilateral breast implants are noted. Prior left shoulder replacement Review of the MIP images confirms the above findings. IMPRESSION: Lingular infiltrate consistent with acute pneumonia. No evidence of pulmonary emboli. Changes consistent with granulomatous disease. Electronically Signed   By: Inez Catalina M.D.   On: 04/02/2019 19:57    Pending Labs Unresulted Labs  (From admission, onward)    Start     Ordered   04/02/19 1605  Culture, blood (Routine x 2)  BLOOD CULTURE X 2,   STAT     04/02/19 1604   Signed and Held  CBC  (enoxaparin (LOVENOX)    CrCl >/= 30 ml/min)  Once,   R    Comments: Baseline for enoxaparin therapy IF NOT ALREADY DRAWN.  Notify MD if PLT < 100 K.    Signed and Held   Signed and Held  Creatinine, serum  (enoxaparin (LOVENOX)    CrCl >/= 30 ml/min)  Once,   R    Comments: Baseline for enoxaparin therapy IF NOT ALREADY DRAWN.    Signed and Held   Signed and Held  Creatinine, serum  (enoxaparin (LOVENOX)    CrCl >/= 30 ml/min)  Weekly,   R    Comments: while on enoxaparin therapy    Signed and Held   Signed and Held  Basic metabolic panel  Tomorrow morning,   R     Signed and Held   Signed and Held  CBC  Tomorrow morning,   R     Signed and Held          Vitals/Pain Today's Vitals   04/02/19 2130 04/02/19 2145 04/02/19 2200 04/02/19 2215  BP: (!) 142/77  (!) 142/56   Pulse: 93 87 90 86  Resp: 18 18 20 15   Temp:      TempSrc:      SpO2: 94% 91% 96% 94%  Weight:      Height:      PainSc:        Isolation Precautions No active isolations  Medications Medications  oxyCODONE (Oxy IR/ROXICODONE) immediate release tablet 5 mg (has  no administration in time range)  ondansetron (ZOFRAN) injection 4 mg (4 mg Intravenous Given 04/02/19 1925)  ketorolac (TORADOL) 30 MG/ML injection 15 mg (15 mg Intravenous Given 04/02/19 1953)  iohexol (OMNIPAQUE) 350 MG/ML injection 75 mL (75 mLs Intravenous Contrast Given 04/02/19 1934)  aspirin chewable tablet 324 mg (324 mg Oral Given 04/02/19 2017)  levofloxacin (LEVAQUIN) IVPB 750 mg (0 mg Intravenous Stopped 04/02/19 2233)  diphenhydrAMINE (BENADRYL) injection 12.5 mg (12.5 mg Intravenous Given 04/02/19 2017)    Mobility walks Low fall risk   Focused Assessments 1   R Recommendations: See Admitting Provider Note  Report given to:   Additional Notes:

## 2019-04-02 NOTE — ED Triage Notes (Signed)
Pt here for dry cough, generalized body pain and weakness, and SHOB.  Reports low grade temps at home.  Tachypnea noted but unlabored.  Has had some incontinence because unable to get to bathroom in time.  NAD at this time.

## 2019-04-02 NOTE — ED Notes (Signed)
Report called to [facility] floor nurse

## 2019-04-02 NOTE — ED Notes (Signed)
Report given to Amy, RN.

## 2019-04-02 NOTE — ED Notes (Signed)
Pt sitting in bed speaking with this RN in NAD, A&Ox4. Reports aches and pain x 3 days on the left shoulder area

## 2019-04-02 NOTE — H&P (Signed)
[facility] NAME: Brooke Cantrell    MR#:  [id number]  DATE OF BIRTH:  09/19/1945  DATE OF ADMISSION:  04/02/2019  PRIMARY CARE PHYSICIAN: Patient, No Pcp Per   REQUESTING/REFERRING PHYSICIAN: Jimmye Norman, MD  CHIEF COMPLAINT:   Chief Complaint  Patient presents with  . Weakness    HISTORY OF PRESENT ILLNESS:  Brooke Cantrell  is a 73 y.o. female who presents with chief complaint as above.  Patient presents the ED with a complaint of weakness.  On evaluation here she is found to have a small pneumonia.  She does meet sepsis criteria initially with some tachycardia and mildly elevated white blood cell count.  She was given IV antibiotics.  Hospitalist were called for admission  PAST MEDICAL HISTORY:   Past Medical History:  Diagnosis Date  . Arthritis   . Breast cancer ([place])    Left breast  . Depression    "mild", no meds  . History of kidney stones      PAST SURGICAL HISTORY:   Past Surgical History:  Procedure Laterality Date  . breast cancer surgery    . BREAST ENHANCEMENT SURGERY    . BREAST LUMPECTOMY Left   . CATARACT EXTRACTION, BILATERAL    . ECTOPIC PREGNANCY SURGERY    . EYE SURGERY    . INNER EAR SURGERY    . TONSILLECTOMY    . TOTAL SHOULDER ARTHROPLASTY Left 04/25/2018  . TOTAL SHOULDER ARTHROPLASTY Left 04/25/2018   Procedure: LEFT TOTAL SHOULDER REPLACEMENT;  Surgeon: Meredith Pel, MD;  Location: [facility];  Service: Orthopedics;  Laterality: Left;  . TUBAL LIGATION       SOCIAL HISTORY:   Social History   Tobacco Use  . Smoking status: Former Smoker    Quit date: 09/11/1996    Years since quitting: 22.5  . Smokeless tobacco: Never Used  Substance Use Topics  . Alcohol use: Yes    Alcohol/week: 5.0 - 7.0 standard drinks    Types: 5 - 7 Standard drinks or equivalent per week    Comment: 5-7 per week, when going out eat     FAMILY HISTORY:   Family History  Problem Relation Age of  Onset  . Allergic rhinitis Father   . Allergic rhinitis Sister   . Allergic rhinitis Brother      DRUG ALLERGIES:   Allergies  Allergen Reactions  . Ciprofloxacin Itching and Rash    MEDICATIONS AT HOME:   Prior to Admission medications   Medication Sig Start Date End Date Taking? Authorizing Provider  aspirin 81 MG EC tablet TAKE 1 TABLET (81 MG TOTAL) BY MOUTH 2 (TWO) TIMES DAILY. 04/26/18  Yes Meredith Pel, MD  CALCIUM-VITAMIN D PO Take 1 tablet by mouth daily.   Yes [provider]  Cranberry 450 MG CAPS Take 450 capsules by mouth daily.    Yes [provider]  gabapentin (NEURONTIN) 300 MG capsule TAKE ONE CAPSULE BY MOUTH WITH BREAKFAST AND LUNCH AND TAKE 2 CAPSULES AT BEDTIME Patient taking differently: TAKE 600MG  BY MOUTH AT BEDTIME 07/09/17  Yes Mcarthur Rossetti, MD  montelukast (SINGULAIR) 10 MG tablet TAKE ONE TABLET ONCE DAILY AS DIRECTED 09/15/15  Yes Kozlow, Donnamarie Poag, MD  Multiple Vitamin (MULTIVITAMIN) capsule Take 1 capsule by mouth daily.   Yes [provider]  nitrofurantoin (MACRODANTIN) 100 MG capsule Take 100 mg by mouth daily. Take one capsule by mouth every day or less, after  intercourse, to prevent infection 11/12/18   [provider]    REVIEW OF SYSTEMS:  Review of Systems  Constitutional: Negative for chills, fever, malaise/fatigue and weight loss.  HENT: Negative for ear pain, hearing loss and tinnitus.   Eyes: Negative for blurred vision, double vision, pain and redness.  Respiratory: Negative for cough, hemoptysis and shortness of breath.   Cardiovascular: Negative for chest pain, palpitations, orthopnea and leg swelling.  Gastrointestinal: Negative for abdominal pain, constipation, diarrhea, nausea and vomiting.  Genitourinary: Negative for dysuria, frequency and hematuria.  Musculoskeletal: Negative for back pain, joint pain and neck pain.  Skin:       No acne, rash, or lesions  Neurological: Positive  for weakness. Negative for dizziness, tremors and focal weakness.  Endo/Heme/Allergies: Negative for polydipsia. Does not bruise/bleed easily.  Psychiatric/Behavioral: Negative for depression. The patient is not nervous/anxious and does not have insomnia.      VITAL SIGNS:   Vitals:   04/02/19 1900 04/02/19 1915 04/02/19 1930 04/02/19 2000  BP:   (!) 147/85 139/76  Pulse: (!) 105 (!) 101 (!) 102 (!) 101  Resp:  19 16 18   Temp:      TempSrc:      SpO2: 96% 94% 95% 93%  Weight:      Height:       Wt Readings from Last 3 Encounters:  04/02/19 72.6 kg  04/25/18 74.8 kg  04/12/18 76 kg    PHYSICAL EXAMINATION:  Physical Exam  Vitals reviewed. Constitutional: She is oriented to person, place, and time. She appears well-developed and well-nourished. No distress.  HENT:  Head: Normocephalic and atraumatic.  Mouth/Throat: Oropharynx is clear and moist.  Eyes: Pupils are equal, round, and reactive to light. Conjunctivae and EOM are normal. No scleral icterus.  Neck: Normal range of motion. Neck supple. No JVD present. No thyromegaly present.  Cardiovascular: Normal rate, regular rhythm and intact distal pulses. Exam reveals no gallop and no friction rub.  No murmur heard. Respiratory: Effort normal. No respiratory distress. She has no wheezes. She has no rales.  Mild fine rhonchi  GI: Soft. Bowel sounds are normal. She exhibits no distension. There is no abdominal tenderness.  Musculoskeletal: Normal range of motion.        General: No edema.     Comments: No arthritis, no gout  Lymphadenopathy:    She has no cervical adenopathy.  Neurological: She is alert and oriented to person, place, and time. No cranial nerve deficit.  No dysarthria, no aphasia  Skin: Skin is warm and dry. No rash noted. No erythema.  Psychiatric: She has a normal mood and affect. Her behavior is normal. Judgment and thought content normal.    LABORATORY PANEL:   CBC Recent Labs  Lab 04/02/19 1612   WBC 12.6*  HGB 13.7  HCT 39.9  PLT 245   ------------------------------------------------------------------------------------------------------------------  Chemistries  Recent Labs  Lab 04/02/19 1612  NA 136  K 3.8  CL 105  CO2 21*  GLUCOSE 121*  BUN 14  CREATININE 0.69  CALCIUM 8.3*  AST 18  ALT 18  ALKPHOS 49  BILITOT 1.8*   ------------------------------------------------------------------------------------------------------------------  Cardiac Enzymes No results for input(s): TROPONINI in the last 168 hours. ------------------------------------------------------------------------------------------------------------------  RADIOLOGY:  Dg Chest 2 View  Result Date: 04/02/2019 CLINICAL DATA:  Dry cough, low-grade fever, generalized body pain and weakness, LEFT side chest pain, shortness of breath EXAM: CHEST - 2 VIEW COMPARISON:  08/31/2015 FINDINGS: Normal heart size, mediastinal contours, and pulmonary  vascularity. Lungs hyperinflated but clear. No pulmonary infiltrate, pleural effusion or pneumothorax. Bones demineralized with note of a reverse LEFT shoulder arthroplasty. Capsular calcification at BILATERAL breast prostheses. IMPRESSION: Hyperinflated lungs without acute abnormalities. Electronically Signed   By: Lavonia Dana M.D.   On: 04/02/2019 18:30   Ct Angio Chest Pe W And/or Wo Contrast  Result Date: 04/02/2019 CLINICAL DATA:  Dry cough and weakness with shortness of breath EXAM: CT ANGIOGRAPHY CHEST WITH CONTRAST TECHNIQUE: Multidetector CT imaging of the chest was performed using the standard protocol during bolus administration of intravenous contrast. Multiplanar CT image reconstructions and MIPs were obtained to evaluate the vascular anatomy. CONTRAST:  9mL OMNIPAQUE IOHEXOL 350 MG/ML SOLN COMPARISON:  None. FINDINGS: Cardiovascular: Thoracic aorta demonstrates mild atherosclerotic calcifications. No aneurysmal dilatation is seen. No cardiac enlargement is  noted. The pulmonary artery shows a normal branching pattern. No definitive filling defects to suggest pulmonary emboli are identified. Mediastinum/Nodes: Thoracic inlet is within normal limits. No sizable mediastinal adenopathy is identified. Mild reactive lymph nodes are noted within the left hilum secondary to the below mention infiltrate. The esophagus as visualized is within normal limits. Lungs/Pleura: Lungs are well aerated bilaterally. Focal infiltrate is noted in the lingula extending back to the left hilum. Some reactive small lymph nodes are seen. No sizable effusion is noted. A centrally calcified parenchymal nodule is noted in the right lower lobe consistent with a small granuloma. Upper Abdomen: Visualized upper abdomen demonstrates hypodensities within the liver likely representing cysts. These are stable by report from 2007. Musculoskeletal: Degenerative changes of the thoracic spine are noted. Calcified bilateral breast implants are noted. Prior left shoulder replacement Review of the MIP images confirms the above findings. IMPRESSION: Lingular infiltrate consistent with acute pneumonia. No evidence of pulmonary emboli. Changes consistent with granulomatous disease. Electronically Signed   By: Inez Catalina M.D.   On: 04/02/2019 19:57    EKG:   Orders placed or performed during the hospital encounter of 04/02/19  . ED EKG  . ED EKG    IMPRESSION AND PLAN:  Principal Problem:   Sepsis ([place]) -lactic acid within normal limits.  Sepsis is due to pneumonia as below.  IV antibiotics given.  Blood pressure stable.  Culture sent Active Problems:   CAP (community acquired pneumonia) IV antibiotics as above   Elevated troponin -barely positive, will get second high-sensitivity troponin for trend.  If it persist positive or goes up she may need full cardiac evaluation.  Chart review performed and case discussed with ED provider. Labs, imaging and/or ECG reviewed by provider and discussed with  patient/family. Management plans discussed with the patient and/or family.  COVID-19 status: Tested negative     DVT PROPHYLAXIS: SubQ lovenox   GI PROPHYLAXIS:  None  ADMISSION STATUS: Inpatient     CODE STATUS: Full Code Status History    Date Active Date Inactive Code Status Order ID Comments User Context   04/25/2018 1416 04/26/2018 2013 Full Code [id number]  Meredith Pel, MD Inpatient   Advance Care Planning Activity      TOTAL TIME TAKING CARE OF THIS PATIENT: 45 minutes.   This patient was evaluated in the context of the global COVID-19 pandemic, which necessitated consideration that the patient might be at risk for infection with the SARS-CoV-2 virus that causes COVID-19. Institutional protocols and algorithms that pertain to the evaluation of patients at risk for COVID-19 are in a state of rapid change based on information released by regulatory bodies including the CDC  and federal and state organizations. These policies and algorithms were followed to the best of this provider's knowledge to date during the patient's care at this facility.  Ethlyn Daniels 04/02/2019, 9:09 PM  [facility]  Office  [phone]  CC: Primary care physician; Patient, No Pcp Per  Note:  This document was prepared using Dragon voice recognition software and may include unintentional dictation errors.

## 2019-04-02 NOTE — ED Notes (Signed)
Pt otf for imaging 

## 2019-04-02 NOTE — ED Provider Notes (Signed)
[facility] Emergency Department Provider Note       Time seen: ----------------------------------------- 6:50 PM on 04/02/2019 -----------------------------------------   I have reviewed the triage vital signs and the nursing notes.  HISTORY   Chief Complaint Weakness   HPI Brooke Cantrell is a 73 y.o. female with a history of arthritis, breast cancer, depression, kidney stones who presents to the ED for cough, body pain, weakness and shortness of breath.  Patient has had low-grade temperature at home.  Patient is complaining of diffuse body aches, worse on the left side of her body.  She states she has had some incontinence because she is unable to get to the bathroom on time.  Past Medical History:  Diagnosis Date  . Arthritis   . Breast cancer ([place])    Left breast  . Depression    "mild", no meds  . History of kidney stones     Patient Active Problem List   Diagnosis Date Noted  . Shoulder arthritis 04/25/2018  . 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 Chronic left shoulder pain 05/17/2017  . 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 Chronic infection of sinus 08/31/2015  . Cough 08/31/2015  . Allergic reaction 08/31/2015    Past Surgical History:  Procedure Laterality Date  . breast cancer surgery    . BREAST ENHANCEMENT SURGERY    . BREAST LUMPECTOMY Left   . CATARACT EXTRACTION, BILATERAL    . ECTOPIC PREGNANCY SURGERY    . EYE SURGERY    . INNER EAR SURGERY    . TONSILLECTOMY    . TOTAL SHOULDER ARTHROPLASTY Left 04/25/2018  . TOTAL SHOULDER ARTHROPLASTY Left 04/25/2018   Procedure: LEFT TOTAL SHOULDER REPLACEMENT;  Surgeon: Meredith Pel, MD;  Location: [facility];  Service: Orthopedics;  Laterality: Left;  . TUBAL LIGATION      Allergies Ciprofloxacin  Social History Social History   Tobacco Use  . Smoking status: Former Smoker    Quit date: 09/11/1996    Years since quitting: 22.5  . Smokeless  tobacco: Never Used  Substance Use Topics  . Alcohol use: Yes    Alcohol/week: 5.0 - 7.0 standard drinks    Types: 5 - 7 Standard drinks or equivalent per week    Comment: 5-7 per week, when going out eat  . Drug use: Never   Review of Systems Constitutional: Negative for fever.  Positive for body aches and chills Cardiovascular: Negative for chest pain. Respiratory: Positive for shortness of breath, dry cough Gastrointestinal: Negative for abdominal pain, vomiting and diarrhea. Musculoskeletal: Negative for back pain. Skin: Negative for rash. Neurological: Negative for headaches, focal weakness or numbness.  All systems negative/normal/unremarkable except as stated in the HPI  ____________________________________________   PHYSICAL EXAM:  VITAL SIGNS: ED Triage Vitals  Enc Vitals Group     BP 04/02/19 1548 119/69     Pulse Rate 04/02/19 1548 (!) 110     Resp 04/02/19 1548 (!) 24     Temp 04/02/19 1548 99.5 F (37.5 C)     Temp Source 04/02/19 1548 Oral     SpO2 04/02/19 1548 98 %     Weight 04/02/19 1549 160 lb (72.6 kg)     Height 04/02/19 1549 5\' 5"  (1.651 m)     Head Circumference --      Peak Flow --      Pain Score 04/02/19 1549 5     Pain Loc --      Pain  Edu? --      Excl. in [place]? --    Constitutional: Alert and oriented. Well appearing and in no distress. Eyes: Conjunctivae are normal. Normal extraocular movements. ENT      Head: Normocephalic and atraumatic.      Nose: No congestion/rhinnorhea.      Mouth/Throat: Mucous membranes are moist.      Neck: No stridor. Cardiovascular: Normal rate, regular rhythm. No murmurs, rubs, or gallops. Respiratory: Normal respiratory effort without tachypnea nor retractions. Breath sounds are clear and equal bilaterally. No wheezes/rales/rhonchi. Gastrointestinal: Soft and nontender. Normal bowel sounds Musculoskeletal: Nontender with normal range of motion in extremities. No lower extremity tenderness nor  edema. Neurologic:  Normal speech and language. No gross focal neurologic deficits are appreciated.  Skin:  Skin is warm, dry and intact. No rash noted. Psychiatric: Mood and affect are normal. Speech and behavior are normal.  ____________________________________________  EKG: Interpreted by me.  Sinus tachycardia with a rate of 111 bpm, normal PR interval, wide QRS, left axis deviation, left bundle branch block, long QT  ____________________________________________  ED COURSE:  As part of my medical decision making, I reviewed the following data within the [place] History obtained from family if available, nursing notes, old chart and ekg, as well as notes from prior ED visits. Patient presented for cough, aches and shortness of breath, we will assess with labs and imaging as indicated at this time. Clinical Course as of Apr 01 1905  Wed Apr 02, 2019  1855 DG Chest 2 View [JW]    Clinical Course User Index [JW] Earleen Newport, MD   Procedures  Brooke Cantrell was evaluated in Emergency Department on 04/02/2019 for the symptoms described in the history of present illness. She was evaluated in the context of the global COVID-19 pandemic, which necessitated consideration that the patient might be at risk for infection with the SARS-CoV-2 virus that causes COVID-19. Institutional protocols and algorithms that pertain to the evaluation of patients at risk for COVID-19 are in a state of rapid change based on information released by regulatory bodies including the CDC and federal and state organizations. These policies and algorithms were followed during the patient's care in the ED.  ____________________________________________   LABS (pertinent positives/negatives)  Labs Reviewed  URINALYSIS, COMPLETE (UACMP) WITH MICROSCOPIC - Abnormal; Notable for the following components:      Result Value   Color, Urine YELLOW (*)    APPearance CLEAR (*)    Ketones, ur 20 (*)     All other components within normal limits  COMPREHENSIVE METABOLIC PANEL - Abnormal; Notable for the following components:   CO2 21 (*)    Glucose, Bld 121 (*)    Calcium 8.3 (*)    Total Bilirubin 1.8 (*)    All other components within normal limits  CBC WITH DIFFERENTIAL/PLATELET - Abnormal; Notable for the following components:   WBC 12.6 (*)    Neutro Abs 8.4 (*)    Monocytes Absolute 1.6 (*)    All other components within normal limits  TROPONIN I (HIGH SENSITIVITY) - Abnormal; Notable for the following components:   Troponin I (High Sensitivity) 23 (*)    All other components within normal limits  CULTURE, BLOOD (ROUTINE X 2)  CULTURE, BLOOD (ROUTINE X 2)  SARS CORONAVIRUS 2 (HOSPITAL ORDER, [facility])  LACTIC ACID, PLASMA  PROTIME-INR    RADIOLOGY Images were viewed by me  Chest x-ray IMPRESSION: Hyperinflated lungs  without acute abnormalities. CTA chest IMPRESSION: Lingular infiltrate consistent with acute pneumonia.  No evidence of pulmonary emboli.  Changes consistent with granulomatous disease. ____________________________________________   DIFFERENTIAL DIAGNOSIS   Pneumonia, coronavirus, PE, pneumothorax, dehydration, electrolyte abnormality, sepsis, MI  FINAL ASSESSMENT AND PLAN  Dyspnea, elevated troponin, community-acquired pneumonia   Plan: The patient had presented for viral symptoms. Patient's labs did reveal some leukocytosis as well as an elevated troponin. Patient's imaging initially did not reveal any acute abnormality on x-ray.  I ordered a CT angiogram of the chest which revealed a lingular infiltrate consistent with pneumonia.  She does have some granulomatous disease.  She will receive IV antibiotics for community-acquired pneumonia.  I will give her a full dose aspirin for elevated troponin and this will need to be trended.  I will discuss with the hospitalist for admission.   Laurence Aly, MD     Note: This note was generated in part or whole with voice recognition software. Voice recognition is usually quite accurate but there are transcription errors that can and very often do occur. I apologize for any typographical errors that were not detected and corrected.     Earleen Newport, MD 04/02/19 2006

## 2019-04-02 NOTE — ED Notes (Signed)
Discussed pt with dr Jimmye Norman. Orders placed.

## 2019-04-03 DIAGNOSIS — A419 Sepsis, unspecified organism: Secondary | ICD-10-CM | POA: Diagnosis not present

## 2019-04-03 LAB — BASIC METABOLIC PANEL
Anion gap: 9 (ref 5–15)
BUN: 16 mg/dL (ref 8–23)
CO2: 23 mmol/L (ref 22–32)
Calcium: 7.6 mg/dL — ABNORMAL LOW (ref 8.9–10.3)
Chloride: 104 mmol/L (ref 98–111)
Creatinine, Ser: 0.82 mg/dL (ref 0.44–1.00)
GFR calc Af Amer: >60 mL/min (ref >60)
GFR calc non Af Amer: >60 mL/min (ref >60)
Glucose, Bld: 107 mg/dL — ABNORMAL HIGH (ref 70–99)
Potassium: 3.6 mmol/L (ref 3.5–5.1)
Sodium: 136 mmol/L (ref 135–145)

## 2019-04-03 LAB — CBC
HCT: 38.1 % (ref 36.0–46.0)
Hemoglobin: 12.6 g/dL (ref 12.0–15.0)
MCH: 30.6 pg (ref 26.0–34.0)
MCHC: 33.1 g/dL (ref 30.0–36.0)
MCV: 92.5 fL (ref 80.0–100.0)
Platelets: 206 10*3/uL (ref 150–400)
RBC: 4.12 MIL/uL (ref 3.87–5.11)
RDW: 12.9 % (ref 11.5–15.5)
WBC: 11.6 10*3/uL — ABNORMAL HIGH (ref 4.0–10.5)
nRBC: 0 % (ref 0.0–0.2)

## 2019-04-03 LAB — TROPONIN I (HIGH SENSITIVITY)
Troponin I (High Sensitivity): 17 ng/L (ref <18)
Troponin I (High Sensitivity): 14 ng/L (ref <18)

## 2019-04-03 MED ORDER — CEFDINIR 300 MG PO CAPS: 300.0000 mg | ORAL_CAPSULE | Freq: Two times a day (BID) | ORAL | 0 refills | Status: DC

## 2019-04-03 MED ORDER — DIPHENHYDRAMINE HCL 25 MG PO CAPS
25.0000 mg | ORAL_CAPSULE | Freq: Four times a day (QID) | ORAL | Status: DC | PRN
  Administered 2019-04-03: 25 mg via ORAL
  Filled 2019-04-03: qty 1

## 2019-04-03 MED ORDER — ASPIRIN EC 81 MG PO TBEC
81.0000 mg | DELAYED_RELEASE_TABLET | Freq: Two times a day (BID) | ORAL | Status: DC
  Administered 2019-04-03: 09:00:00 81 mg via ORAL
  Filled 2019-04-03: qty 1

## 2019-04-03 MED ORDER — ACETAMINOPHEN 325 MG PO TABS: 650.0000 mg | ORAL_TABLET | Freq: Four times a day (QID) | ORAL | Status: DC | PRN

## 2019-04-03 MED ORDER — ENOXAPARIN SODIUM 40 MG/0.4ML ~~LOC~~ SOLN
40.0000 mg | SUBCUTANEOUS | Status: DC
  Administered 2019-04-03: 40 mg via SUBCUTANEOUS
  Filled 2019-04-03: qty 0.4

## 2019-04-03 MED ORDER — GUAIFENESIN-DM 100-10 MG/5ML PO SYRP: 5.0000 mL | ORAL_SOLUTION | ORAL | Status: DC | PRN

## 2019-04-03 MED ORDER — SODIUM CHLORIDE 0.9 % IV SOLN
500.0000 mg | INTRAVENOUS | Status: DC
  Filled 2019-04-03: qty 500

## 2019-04-03 MED ORDER — ACETAMINOPHEN 650 MG RE SUPP: 650.0000 mg | Freq: Four times a day (QID) | RECTAL | Status: DC | PRN

## 2019-04-03 MED ORDER — CEFDINIR 300 MG PO CAPS: 300.0000 mg | ORAL_CAPSULE | Freq: Two times a day (BID) | ORAL | 0 refills | Status: AC

## 2019-04-03 MED ORDER — ONDANSETRON HCL 4 MG/2ML IJ SOLN: 4.0000 mg | Freq: Four times a day (QID) | INTRAMUSCULAR | Status: DC | PRN

## 2019-04-03 MED ORDER — SODIUM CHLORIDE 0.9 % IV SOLN
1.0000 g | INTRAVENOUS | Status: DC
  Filled 2019-04-03: qty 10

## 2019-04-03 MED ORDER — ONDANSETRON HCL 4 MG PO TABS: 4.0000 mg | ORAL_TABLET | Freq: Four times a day (QID) | ORAL | Status: DC | PRN

## 2019-04-03 MED ORDER — MONTELUKAST SODIUM 10 MG PO TABS: 10.0000 mg | ORAL_TABLET | Freq: Every day | ORAL | Status: DC

## 2019-04-03 MED ORDER — GABAPENTIN 300 MG PO CAPS: 600.0000 mg | ORAL_CAPSULE | Freq: Every day | ORAL | Status: DC

## 2019-04-03 NOTE — Care Management CC44 (Signed)
Condition Code 44 Documentation Completed  Patient Details  Name: Brooke Cantrell MRN: [id number] Date of Birth: 1945-11-04   Condition Code 44 given:  Yes Patient signature on Condition Code 44 notice:  Yes Documentation of 2 MD's agreement:  Yes Code 44 added to claim:  Yes    Su Hilt, RN 04/03/2019, 11:52 AM

## 2019-04-03 NOTE — Progress Notes (Signed)
Pt notified she needs a follow up 1-2 weeks, Pt states she is in the process of changing primary doctors, she states she will make follow up appt.

## 2019-04-03 NOTE — Progress Notes (Signed)
DISCHARGE NOTE:  Pt given discharge instructions and script. Pt verbalized understanding. Pt wheeled to car by staff, husband providing transportation.

## 2019-04-03 NOTE — Discharge Summary (Signed)
[facility] NAME: Brooke Cantrell    MR#:  [id number]  DATE OF BIRTH:  [birth year]  DATE OF ADMISSION:  04/02/2019 ADMITTING PHYSICIAN: Lance Coon, MD  DATE OF DISCHARGE: 04/03/2019  PRIMARY CARE PHYSICIAN: Patient, No Pcp Per    ADMISSION DIAGNOSIS:  Elevated troponin I level [R79.89] Community acquired pneumonia, unspecified laterality [J18.9]  DISCHARGE DIAGNOSIS:  Principal Problem:   Sepsis ([place]) Active Problems:   CAP (community acquired pneumonia)   Elevated troponin   SECONDARY DIAGNOSIS:   Past Medical History:  Diagnosis Date  . Arthritis   . Breast cancer ([place])    Left breast  . Depression    "mild", no meds  . History of kidney stones     HOSPITAL COURSE:   73 year old female with history of breast cancer who presented with shortness of breath to the emergency room.  1.  Sepsis: Patient presented with tachycardia and leukocytosis.  Sepsis is due to community-acquired pneumonia and has resolved.  2.  Community-acquired pneumonia: Patient was on Rocephin and azithromycin will be discharged on cefdinir for a total of 5 days of treatment.   3.  Mildly elevated troponin: This is due to demand ischemia and she has been ruled out for ACS.     DISCHARGE CONDITIONS AND DIET:  Stable for discharge on regular diet  CONSULTS OBTAINED:    DRUG ALLERGIES:   Allergies  Allergen Reactions  . Ciprofloxacin Itching and Rash    DISCHARGE MEDICATIONS:   Allergies as of 04/03/2019      Reactions   Ciprofloxacin Itching, Rash      Medication List    TAKE these medications   aspirin 81 MG EC tablet TAKE 1 TABLET (81 MG TOTAL) BY MOUTH 2 (TWO) TIMES DAILY.   CALCIUM-VITAMIN D PO Take 1 tablet by mouth daily.   cefdinir 300 MG capsule Commonly known as: OMNICEF Take 1 capsule (300 mg total) by mouth 2 (two) times daily for 4 days.   Cranberry 450 MG Caps Take 450 capsules by mouth daily.    gabapentin 300 MG capsule Commonly known as: NEURONTIN TAKE ONE CAPSULE BY MOUTH WITH BREAKFAST AND LUNCH AND TAKE 2 CAPSULES AT BEDTIME What changed: See the new instructions.   montelukast 10 MG tablet Commonly known as: SINGULAIR TAKE ONE TABLET ONCE DAILY AS DIRECTED   multivitamin capsule Take 1 capsule by mouth daily.   nitrofurantoin 100 MG capsule Commonly known as: MACRODANTIN Take 100 mg by mouth daily. Take one capsule by mouth every day or less, after intercourse, to prevent infection         Today   CHIEF COMPLAINT:  Patient is doing well this morning.  Denies shortness of breath or fevers.   VITAL SIGNS:  Blood pressure 117/68, pulse 88, temperature 99 F (37.2 C), temperature source Oral, resp. rate 18, height 5\' 5"  (1.651 m), weight 72.6 kg, SpO2 92 %.   REVIEW OF SYSTEMS:  Review of Systems  Constitutional: Negative.  Negative for chills, fever and malaise/fatigue.  HENT: Negative.  Negative for ear discharge, ear pain, hearing loss, nosebleeds and sore throat.   Eyes: Negative.  Negative for blurred vision and pain.  Respiratory: Negative.  Negative for cough, hemoptysis, shortness of breath and wheezing.   Cardiovascular: Negative.  Negative for chest pain, palpitations and leg swelling.  Gastrointestinal: Negative.  Negative for abdominal pain, blood in stool, diarrhea, nausea and vomiting.  Genitourinary: Negative.  Negative for dysuria.  Musculoskeletal: Negative.  Negative for back pain.  Skin: Negative.   Neurological: Negative for dizziness, tremors, speech change, focal weakness, seizures and headaches.  Endo/Heme/Allergies: Negative.  Does not bruise/bleed easily.  Psychiatric/Behavioral: Negative.  Negative for depression, hallucinations and suicidal ideas.     PHYSICAL EXAMINATION:   GENERAL:  73 y.o.-year-old patient lying in the bed with no acute distress.  NECK:  Supple, no jugular venous distention. No thyroid enlargement, no  tenderness.  LUNGS: Normal breath sounds bilaterally, no wheezing, rales,rhonchi  No use of accessory muscles of respiration.  CARDIOVASCULAR: S1, S2 normal. No murmurs, rubs, or gallops.  ABDOMEN: Soft, non-tender, non-distended. Bowel sounds present. No organomegaly or mass.  EXTREMITIES: No pedal edema, cyanosis, or clubbing.  PSYCHIATRIC: The patient is alert and oriented x 3.  SKIN: No obvious rash, lesion, or ulcer.   DATA REVIEW:   CBC Recent Labs  Lab 04/03/19 0100  WBC 11.6*  HGB 12.6  HCT 38.1  PLT 206    Chemistries  Recent Labs  Lab 04/02/19 1612 04/03/19 0100  NA 136 136  K 3.8 3.6  CL 105 104  CO2 21* 23  GLUCOSE 121* 107*  BUN 14 16  CREATININE 0.69 0.82  CALCIUM 8.3* 7.6*  AST 18  --   ALT 18  --   ALKPHOS 49  --   BILITOT 1.8*  --     Cardiac Enzymes No results for input(s): TROPONINI in the last 168 hours.  Microbiology Results  @[email or web address] @  RADIOLOGY:  Dg Chest 2 View  Result Date: 04/02/2019 CLINICAL DATA:  Dry cough, low-grade fever, generalized body pain and weakness, LEFT side chest pain, shortness of breath EXAM: CHEST - 2 VIEW COMPARISON:  08/31/2015 FINDINGS: Normal heart size, mediastinal contours, and pulmonary vascularity. Lungs hyperinflated but clear. No pulmonary infiltrate, pleural effusion or pneumothorax. Bones demineralized with note of a reverse LEFT shoulder arthroplasty. Capsular calcification at BILATERAL breast prostheses. IMPRESSION: Hyperinflated lungs without acute abnormalities. Electronically Signed   By: Lavonia Dana M.D.   On: 04/02/2019 18:30   Ct Angio Chest Pe W And/or Wo Contrast  Result Date: 04/02/2019 CLINICAL DATA:  Dry cough and weakness with shortness of breath EXAM: CT ANGIOGRAPHY CHEST WITH CONTRAST TECHNIQUE: Multidetector CT imaging of the chest was performed using the standard protocol during bolus administration of intravenous contrast. Multiplanar CT image reconstructions and MIPs were obtained to  evaluate the vascular anatomy. CONTRAST:  38mL OMNIPAQUE IOHEXOL 350 MG/ML SOLN COMPARISON:  None. FINDINGS: Cardiovascular: Thoracic aorta demonstrates mild atherosclerotic calcifications. No aneurysmal dilatation is seen. No cardiac enlargement is noted. The pulmonary artery shows a normal branching pattern. No definitive filling defects to suggest pulmonary emboli are identified. Mediastinum/Nodes: Thoracic inlet is within normal limits. No sizable mediastinal adenopathy is identified. Mild reactive lymph nodes are noted within the left hilum secondary to the below mention infiltrate. The esophagus as visualized is within normal limits. Lungs/Pleura: Lungs are well aerated bilaterally. Focal infiltrate is noted in the lingula extending back to the left hilum. Some reactive small lymph nodes are seen. No sizable effusion is noted. A centrally calcified parenchymal nodule is noted in the right lower lobe consistent with a small granuloma. Upper Abdomen: Visualized upper abdomen demonstrates hypodensities within the liver likely representing cysts. These are stable by report from 2007. Musculoskeletal: Degenerative changes of the thoracic spine are noted. Calcified bilateral breast implants are noted. Prior left shoulder replacement Review of the MIP images confirms the above findings. IMPRESSION: Lingular infiltrate consistent with  acute pneumonia. No evidence of pulmonary emboli. Changes consistent with granulomatous disease. Electronically Signed   By: Inez Catalina M.D.   On: 04/02/2019 19:57      Allergies as of 04/03/2019      Reactions   Ciprofloxacin Itching, Rash      Medication List    TAKE these medications   aspirin 81 MG EC tablet TAKE 1 TABLET (81 MG TOTAL) BY MOUTH 2 (TWO) TIMES DAILY.   CALCIUM-VITAMIN D PO Take 1 tablet by mouth daily.   cefdinir 300 MG capsule Commonly known as: OMNICEF Take 1 capsule (300 mg total) by mouth 2 (two) times daily for 4 days.   Cranberry 450 MG  Caps Take 450 capsules by mouth daily.   gabapentin 300 MG capsule Commonly known as: NEURONTIN TAKE ONE CAPSULE BY MOUTH WITH BREAKFAST AND LUNCH AND TAKE 2 CAPSULES AT BEDTIME What changed: See the new instructions.   montelukast 10 MG tablet Commonly known as: SINGULAIR TAKE ONE TABLET ONCE DAILY AS DIRECTED   multivitamin capsule Take 1 capsule by mouth daily.   nitrofurantoin 100 MG capsule Commonly known as: MACRODANTIN Take 100 mg by mouth daily. Take one capsule by mouth every day or less, after intercourse, to prevent infection           Management plans discussed with the patient and she is in agreement. Stable for discharge home  Patient should follow up with pcp  CODE STATUS:     Code Status Orders  (From admission, onward)         Start     Ordered   04/03/19 0010  Full code  Continuous     04/03/19 0009        Code Status History    Date Active Date Inactive Code Status Order ID Comments User Context   04/25/2018 1416 04/26/2018 2013 Full Code [id number]  Meredith Pel, MD Inpatient   Advance Care Planning Activity    Advance Directive Documentation     Most Recent Value  Type of Advance Directive  Healthcare Power of Attorney  Pre-existing out of facility DNR order (yellow form or pink MOST form)  -  "MOST" Form in Place?  -      TOTAL TIME TAKING CARE OF THIS PATIENT: 38 minutes.    Note: This dictation was prepared with Dragon dictation along with smaller phrase technology. Any transcriptional errors that result from this process are unintentional.  Bettey Costa M.D on 04/03/2019 at 11:27 AM  Between 7am to 6pm - Pager - [phone] After 6pm go to [email or web address] - password [email or web address] [facility]  Office  [phone]  CC: Primary care physician; Patient, No Pcp Per

## 2019-04-03 NOTE — Plan of Care (Signed)

## 2019-04-07 LAB — CULTURE, BLOOD (ROUTINE X 2)
Culture: NO GROWTH
Culture: NO GROWTH
Special Requests: ADEQUATE
Special Requests: ADEQUATE

## 2019-04-14 DIAGNOSIS — Z853 Personal history of malignant neoplasm of breast: Secondary | ICD-10-CM | POA: Diagnosis not present

## 2019-04-14 DIAGNOSIS — Z09 Encounter for follow-up examination after completed treatment for conditions other than malignant neoplasm: Secondary | ICD-10-CM | POA: Diagnosis not present

## 2019-04-14 DIAGNOSIS — R0602 Shortness of breath: Secondary | ICD-10-CM | POA: Diagnosis not present

## 2019-04-14 DIAGNOSIS — J189 Pneumonia, unspecified organism: Secondary | ICD-10-CM | POA: Diagnosis not present

## 2019-04-14 DIAGNOSIS — M8580 Other specified disorders of bone density and structure, unspecified site: Secondary | ICD-10-CM | POA: Diagnosis not present

## 2019-04-27 DIAGNOSIS — Z20828 Contact with and (suspected) exposure to other viral communicable diseases: Secondary | ICD-10-CM | POA: Diagnosis not present

## 2019-05-05 DIAGNOSIS — L814 Other melanin hyperpigmentation: Secondary | ICD-10-CM | POA: Diagnosis not present

## 2019-05-05 DIAGNOSIS — L570 Actinic keratosis: Secondary | ICD-10-CM | POA: Diagnosis not present

## 2019-05-05 DIAGNOSIS — L821 Other seborrheic keratosis: Secondary | ICD-10-CM | POA: Diagnosis not present

## 2019-05-05 DIAGNOSIS — D225 Melanocytic nevi of trunk: Secondary | ICD-10-CM | POA: Diagnosis not present

## 2019-05-05 DIAGNOSIS — Z85828 Personal history of other malignant neoplasm of skin: Secondary | ICD-10-CM | POA: Diagnosis not present

## 2019-05-14 DIAGNOSIS — Z8701 Personal history of pneumonia (recurrent): Secondary | ICD-10-CM | POA: Diagnosis not present

## 2019-05-14 DIAGNOSIS — R030 Elevated blood-pressure reading, without diagnosis of hypertension: Secondary | ICD-10-CM | POA: Diagnosis not present

## 2019-05-14 DIAGNOSIS — M8580 Other specified disorders of bone density and structure, unspecified site: Secondary | ICD-10-CM | POA: Diagnosis not present

## 2019-05-14 DIAGNOSIS — Z136 Encounter for screening for cardiovascular disorders: Secondary | ICD-10-CM | POA: Diagnosis not present

## 2019-06-19 DIAGNOSIS — M810 Age-related osteoporosis without current pathological fracture: Secondary | ICD-10-CM | POA: Diagnosis not present

## 2019-07-04 DIAGNOSIS — Z87891 Personal history of nicotine dependence: Secondary | ICD-10-CM | POA: Diagnosis not present

## 2019-07-04 DIAGNOSIS — L309 Dermatitis, unspecified: Secondary | ICD-10-CM | POA: Diagnosis not present

## 2019-07-10 DIAGNOSIS — M810 Age-related osteoporosis without current pathological fracture: Secondary | ICD-10-CM | POA: Diagnosis not present

## 2019-11-23 IMAGING — CT CT SHOULDER*L* W/O CM
1 of 2 series · 9 of 14 positions shown, 12 images · non-contrast
Comparison: Chest radiographs 08/31/2015.

CLINICAL DATA: Left shoulder pain. Planned left shoulder total
arthroplasty.

EXAM:
CT OF THE UPPER LEFT EXTREMITY WITHOUT CONTRAST
TECHNIQUE: Multidetector CT imaging of the left shoulder was performed
according to the standard protocol.

[Series 6: thin soft · axial · 0.47mm/px · z∈[-250,-85]mm · 9 of 346 slices shown, 12 images]
[im 35/346  soft-tissue]
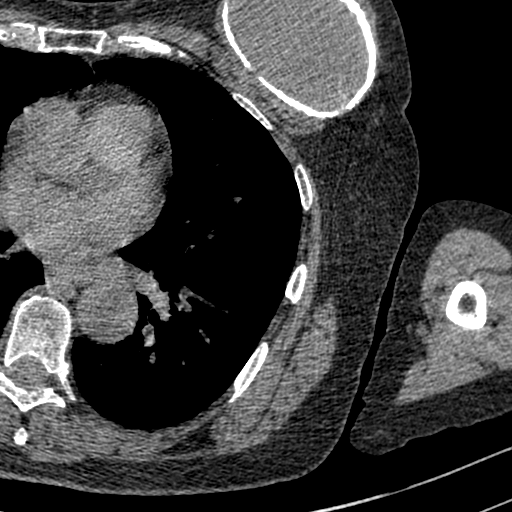
[im 35/346  bone]
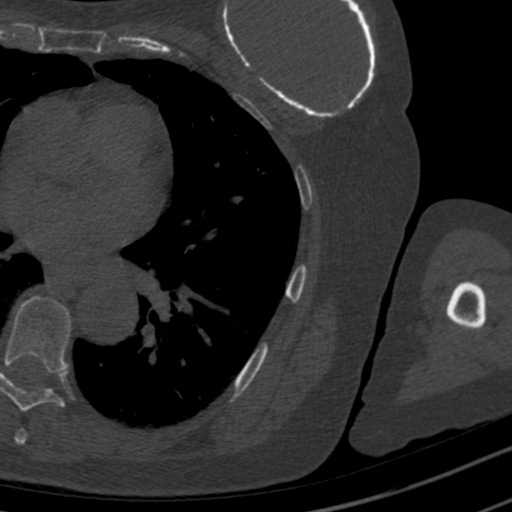
[im 70/346  bone]
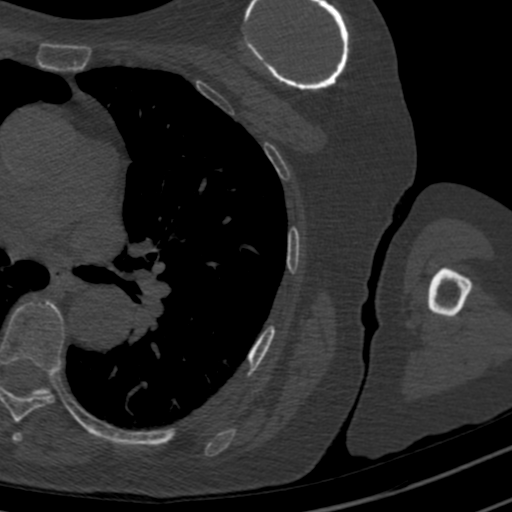
[im 104/346  bone]
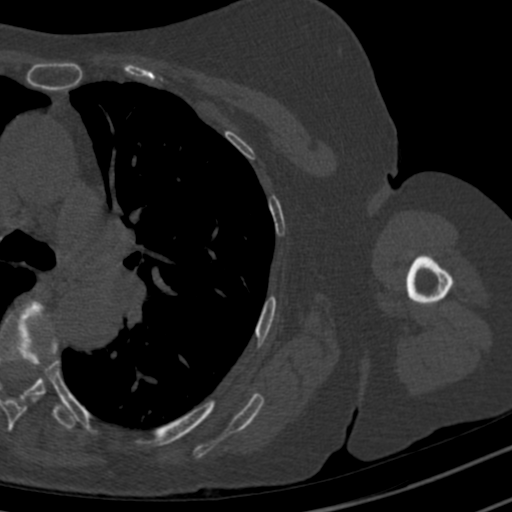
[im 139/346  bone]
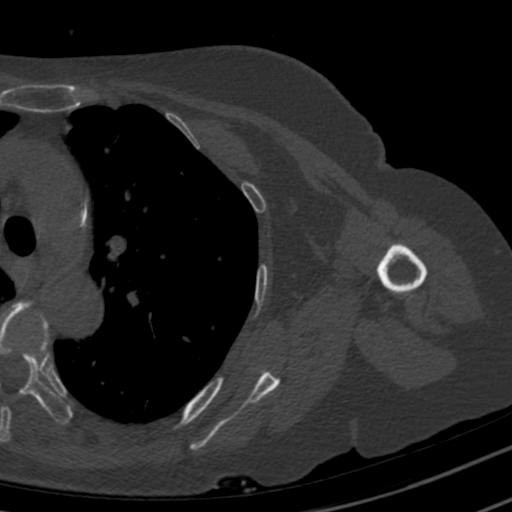
[im 173/346  soft-tissue]
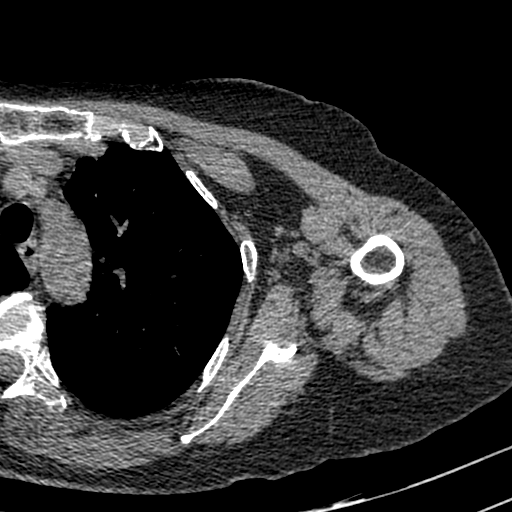
[im 173/346  bone]
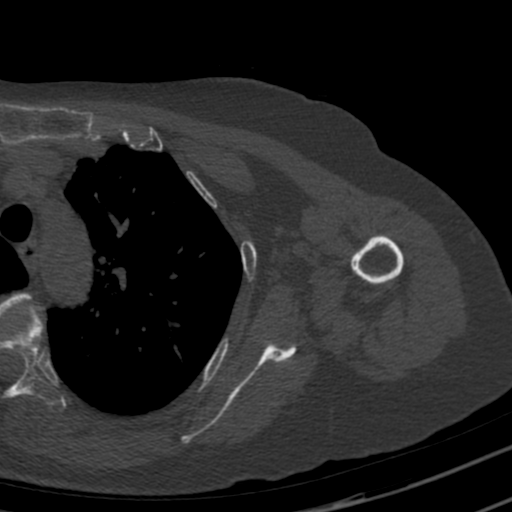
[im 208/346  bone]
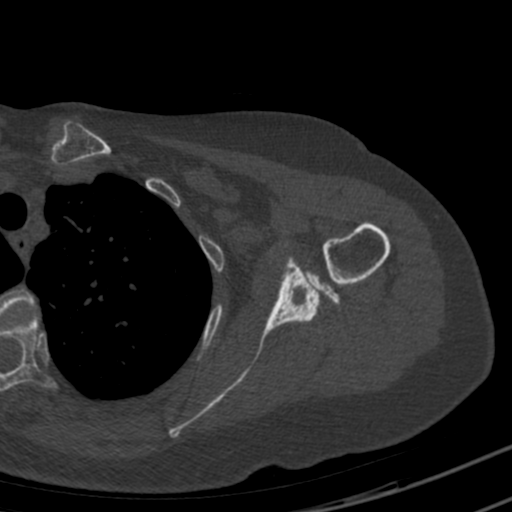
[im 242/346  bone]
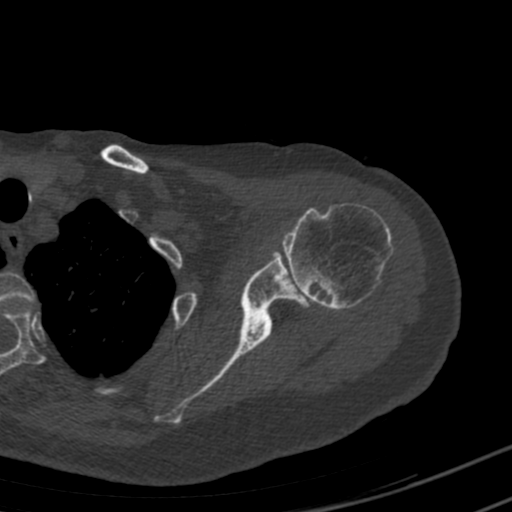
[im 277/346  bone]
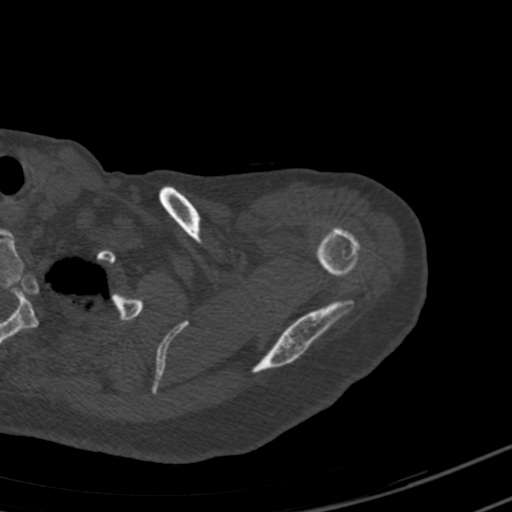
[im 311/346  soft-tissue]
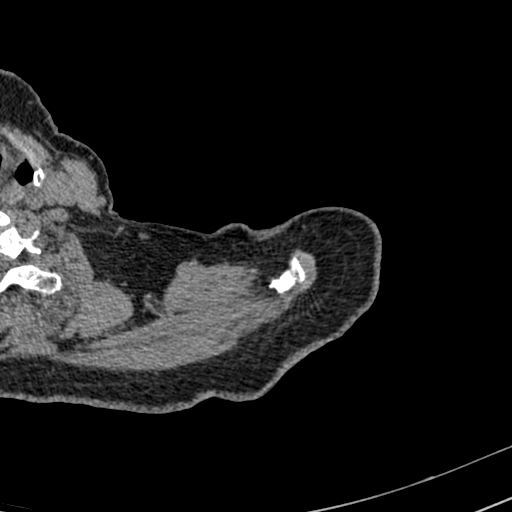
[im 311/346  bone]
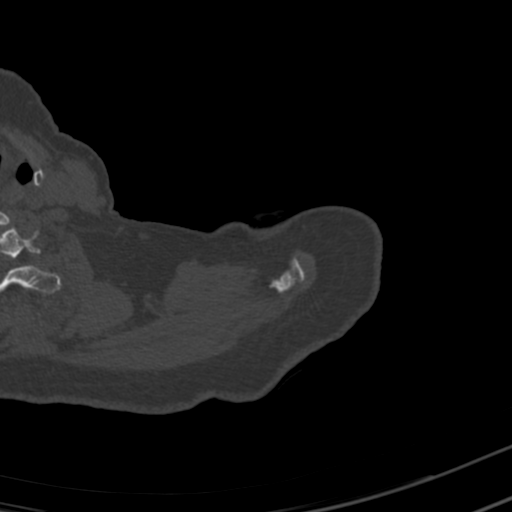

[9 of 14 positions shown; findings below may reference images not displayed]

FINDINGS: Bones/Joint/Cartilage

There are severe glenohumeral degenerative changes with joint space
narrowing, osteophytes and subchondral cyst formation in the humeral
head and glenoid. The humeral head is high-riding with mild
narrowing of the subacromial space to 6 mm. There is a small
shoulder joint effusion with synovial thickening and a tiny loose
body in the axillary recess. No apparent chondrocalcinosis.

No evidence of acute fracture or dislocation. There are mild
acromioclavicular degenerative changes. A left-sided cervical rib is
noted.

Ligaments

Suboptimally assessed by CT.

Muscles and Tendons

The rotator cuff is grossly intact. No focal muscular atrophy
identified.

Soft tissues

There are no focal periarticular fluid collections or inflammatory
changes. Peripherally calcified left breast implant noted. There is
aortic and coronary artery atherosclerosis. Mild emphysematous
changes are present within the left lung.
IMPRESSION: 1. Severe left glenohumeral arthropathy, likely osteoarthritis or
CPPD arthropathy. Associated synovial thickening and small loose
body.
2. Although the subacromial space is mildly narrowed, the rotator
cuff is grossly intact, and no focal muscular atrophy identified.

## 2019-12-25 ENCOUNTER — Other Ambulatory Visit: Payer: Self-pay | Admitting: Family Medicine

## 2019-12-25 DIAGNOSIS — R9389 Abnormal findings on diagnostic imaging of other specified body structures: Secondary | ICD-10-CM

## 2019-12-25 DIAGNOSIS — R0602 Shortness of breath: Secondary | ICD-10-CM

## 2020-01-05 ENCOUNTER — Ambulatory Visit
Admission: RE | Admit: 2020-01-05 | Discharge: 2020-01-05 | Disposition: A | Discharge status: Home / Self Care | Payer: Medicare Other | Source: Ambulatory Visit | Attending: Family Medicine | Admitting: Family Medicine

## 2020-01-05 ENCOUNTER — Other Ambulatory Visit: Payer: Self-pay

## 2020-01-05 DIAGNOSIS — R9389 Abnormal findings on diagnostic imaging of other specified body structures: Secondary | ICD-10-CM | POA: Diagnosis present

## 2020-01-05 DIAGNOSIS — R0602 Shortness of breath: Secondary | ICD-10-CM | POA: Insufficient documentation

## 2020-01-08 DIAGNOSIS — R911 Solitary pulmonary nodule: Secondary | ICD-10-CM | POA: Insufficient documentation

## 2020-01-19 IMAGING — DX DG SHOULDER 1V*L*
1 series · 1 of 1 positions shown · non-contrast
Comparison: None.

CLINICAL DATA: Status post left shoulder replacement

EXAM:
LEFT SHOULDER - 1 VIEW

[shoulder ap]
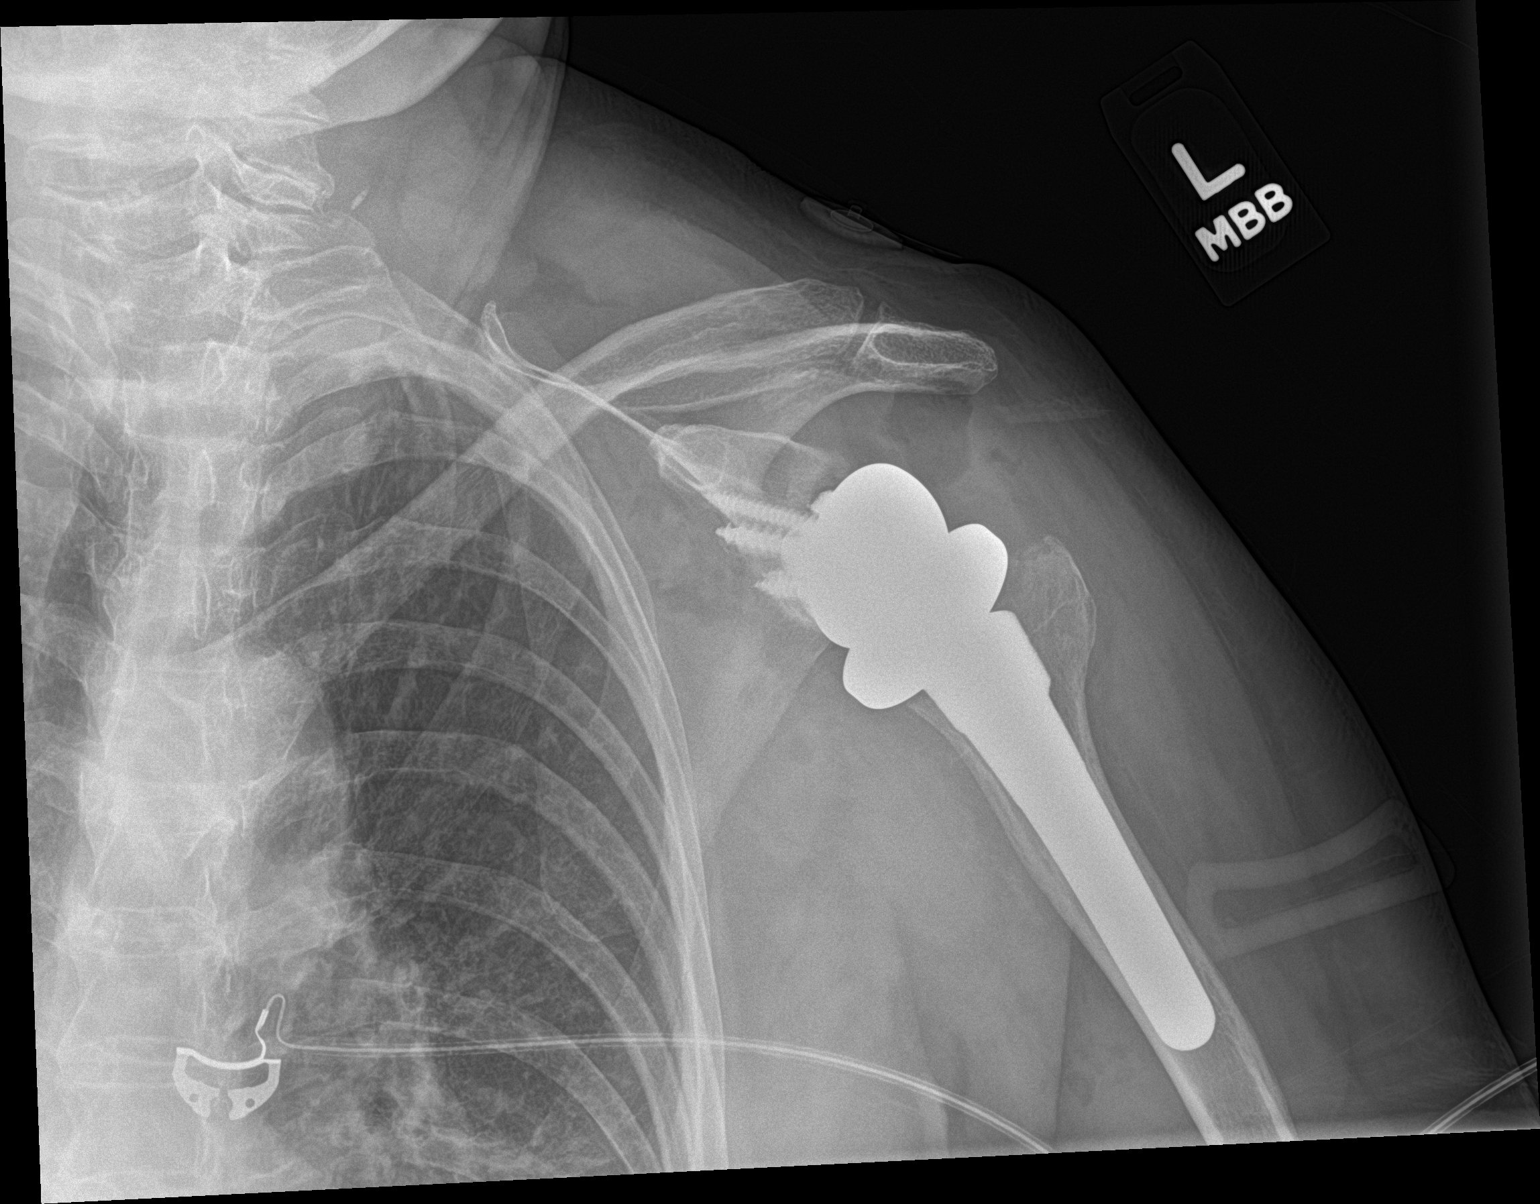

[1 of 1 positions shown; findings below may reference images not displayed]

FINDINGS: Patient is status post left shoulder replacement. Hardware is in
good position. Postoperative air noted.
IMPRESSION: Left shoulder replacement as above.

## 2020-05-03 ENCOUNTER — Other Ambulatory Visit: Payer: Self-pay | Admitting: Pulmonary Disease

## 2020-05-03 DIAGNOSIS — R911 Solitary pulmonary nodule: Secondary | ICD-10-CM

## 2020-06-08 ENCOUNTER — Telehealth: Payer: Self-pay | Admitting: Internal Medicine

## 2020-06-08 NOTE — Telephone Encounter (Signed)
Called to Discuss with patient about Covid symptoms and the use of the monoclonal antibody infusion for those with mild to moderate Covid symptoms and at a high risk of hospitalization.     Pt appears to qualify for this infusion due to co-morbid conditions and/or a member of an at-risk group in accordance with the FDA Emergency Use Authorization.    Pt is already scheduled for MAB infusion tomorrow at [facility]. Told to f/u if anything should change.   Alan Ripper, [place]

## 2020-06-09 ENCOUNTER — Emergency Department: Payer: Medicare Other

## 2020-06-09 ENCOUNTER — Encounter: Payer: Self-pay | Admitting: Emergency Medicine

## 2020-06-09 ENCOUNTER — Other Ambulatory Visit: Payer: Self-pay

## 2020-06-09 DIAGNOSIS — Z96612 Presence of left artificial shoulder joint: Secondary | ICD-10-CM | POA: Diagnosis present

## 2020-06-09 DIAGNOSIS — U071 COVID-19: Secondary | ICD-10-CM | POA: Diagnosis not present

## 2020-06-09 DIAGNOSIS — Z79899 Other long term (current) drug therapy: Secondary | ICD-10-CM

## 2020-06-09 DIAGNOSIS — Z882 Allergy status to sulfonamides status: Secondary | ICD-10-CM

## 2020-06-09 DIAGNOSIS — J9601 Acute respiratory failure with hypoxia: Secondary | ICD-10-CM | POA: Diagnosis present

## 2020-06-09 DIAGNOSIS — J1282 Pneumonia due to coronavirus disease 2019: Secondary | ICD-10-CM | POA: Diagnosis present

## 2020-06-09 DIAGNOSIS — E785 Hyperlipidemia, unspecified: Secondary | ICD-10-CM | POA: Diagnosis present

## 2020-06-09 DIAGNOSIS — M1990 Unspecified osteoarthritis, unspecified site: Secondary | ICD-10-CM | POA: Diagnosis present

## 2020-06-09 DIAGNOSIS — R918 Other nonspecific abnormal finding of lung field: Secondary | ICD-10-CM | POA: Diagnosis present

## 2020-06-09 DIAGNOSIS — Z853 Personal history of malignant neoplasm of breast: Secondary | ICD-10-CM

## 2020-06-09 DIAGNOSIS — Z7982 Long term (current) use of aspirin: Secondary | ICD-10-CM

## 2020-06-09 DIAGNOSIS — Z9851 Tubal ligation status: Secondary | ICD-10-CM

## 2020-06-09 DIAGNOSIS — Z87891 Personal history of nicotine dependence: Secondary | ICD-10-CM

## 2020-06-09 DIAGNOSIS — F32A Depression, unspecified: Secondary | ICD-10-CM | POA: Diagnosis present

## 2020-06-09 DIAGNOSIS — R7982 Elevated C-reactive protein (CRP): Secondary | ICD-10-CM | POA: Diagnosis present

## 2020-06-09 LAB — CBC
HCT: 40.1 % (ref 36.0–46.0)
Hemoglobin: 13.9 g/dL (ref 12.0–15.0)
MCH: 30.6 pg (ref 26.0–34.0)
MCHC: 34.7 g/dL (ref 30.0–36.0)
MCV: 88.3 fL (ref 80.0–100.0)
Platelets: 310 10*3/uL (ref 150–400)
RBC: 4.54 MIL/uL (ref 3.87–5.11)
RDW: 13.5 % (ref 11.5–15.5)
WBC: 15.6 10*3/uL — ABNORMAL HIGH (ref 4.0–10.5)
nRBC: 0 % (ref 0.0–0.2)

## 2020-06-09 LAB — COMPREHENSIVE METABOLIC PANEL
ALT: 24 U/L (ref 0–44)
AST: 23 U/L (ref 15–41)
Albumin: 3.1 g/dL — ABNORMAL LOW (ref 3.5–5.0)
Alkaline Phosphatase: 39 U/L (ref 38–126)
Anion gap: 10 (ref 5–15)
BUN: 23 mg/dL (ref 8–23)
CO2: 24 mmol/L (ref 22–32)
Calcium: 9.1 mg/dL (ref 8.9–10.3)
Chloride: 103 mmol/L (ref 98–111)
Creatinine, Ser: 0.85 mg/dL (ref 0.44–1.00)
GFR calc Af Amer: >60 mL/min (ref >60)
GFR calc non Af Amer: >60 mL/min (ref >60)
Glucose, Bld: 131 mg/dL — ABNORMAL HIGH (ref 70–99)
Potassium: 3.7 mmol/L (ref 3.5–5.1)
Sodium: 137 mmol/L (ref 135–145)
Total Bilirubin: 1.1 mg/dL (ref 0.3–1.2)
Total Protein: 7 g/dL (ref 6.5–8.1)

## 2020-06-09 NOTE — ED Notes (Signed)
Per dr Joni Fears pt can eat food.  Husband brought food for pt.

## 2020-06-09 NOTE — ED Triage Notes (Signed)
Pt sent from [facility] Covid + was there for an antibody infusion but was tachypnic with low O2, pt arrived on [facility]

## 2020-06-09 NOTE — ED Notes (Signed)
Pt alert.  Talking on cell phone in lobby behind red line.

## 2020-06-09 NOTE — ED Triage Notes (Signed)
Pt presents to ED via wheelchair from [facility], pt states was at [facility] for antibody infusion, and sat down due to not feeling good. Pt states is covid+, pt denies CP, is currently on 3L via [place] denies home O2 use. Pt with noted tachypnea in triage. Pt c/o generalized weakness at this time.

## 2020-06-10 ENCOUNTER — Inpatient Hospital Stay
Admission: EM | Admit: 2020-06-10 | Discharge: 2020-06-12 | DRG: 177 | Disposition: A | Discharge status: Home / Self Care | Payer: Medicare Other | Source: Ambulatory Visit | Attending: Internal Medicine | Admitting: Internal Medicine

## 2020-06-10 DIAGNOSIS — Z7982 Long term (current) use of aspirin: Secondary | ICD-10-CM | POA: Diagnosis not present

## 2020-06-10 DIAGNOSIS — R918 Other nonspecific abnormal finding of lung field: Secondary | ICD-10-CM | POA: Diagnosis present

## 2020-06-10 DIAGNOSIS — J9601 Acute respiratory failure with hypoxia: Secondary | ICD-10-CM

## 2020-06-10 DIAGNOSIS — R531 Weakness: Secondary | ICD-10-CM

## 2020-06-10 DIAGNOSIS — U071 COVID-19: Principal | ICD-10-CM

## 2020-06-10 DIAGNOSIS — Z853 Personal history of malignant neoplasm of breast: Secondary | ICD-10-CM | POA: Diagnosis not present

## 2020-06-10 DIAGNOSIS — J1282 Pneumonia due to coronavirus disease 2019: Secondary | ICD-10-CM

## 2020-06-10 DIAGNOSIS — J9600 Acute respiratory failure, unspecified whether with hypoxia or hypercapnia: Secondary | ICD-10-CM | POA: Diagnosis present

## 2020-06-10 DIAGNOSIS — E785 Hyperlipidemia, unspecified: Secondary | ICD-10-CM

## 2020-06-10 DIAGNOSIS — Z79899 Other long term (current) drug therapy: Secondary | ICD-10-CM | POA: Diagnosis not present

## 2020-06-10 DIAGNOSIS — Z87891 Personal history of nicotine dependence: Secondary | ICD-10-CM | POA: Diagnosis not present

## 2020-06-10 DIAGNOSIS — R7982 Elevated C-reactive protein (CRP): Secondary | ICD-10-CM | POA: Diagnosis present

## 2020-06-10 DIAGNOSIS — Z87898 Personal history of other specified conditions: Secondary | ICD-10-CM

## 2020-06-10 DIAGNOSIS — Z9851 Tubal ligation status: Secondary | ICD-10-CM | POA: Diagnosis not present

## 2020-06-10 DIAGNOSIS — M1990 Unspecified osteoarthritis, unspecified site: Secondary | ICD-10-CM | POA: Diagnosis present

## 2020-06-10 DIAGNOSIS — Z96612 Presence of left artificial shoulder joint: Secondary | ICD-10-CM | POA: Diagnosis present

## 2020-06-10 DIAGNOSIS — F32A Depression, unspecified: Secondary | ICD-10-CM | POA: Diagnosis present

## 2020-06-10 DIAGNOSIS — Z882 Allergy status to sulfonamides status: Secondary | ICD-10-CM | POA: Diagnosis not present

## 2020-06-10 LAB — URINALYSIS, COMPLETE (UACMP) WITH MICROSCOPIC
Bacteria, UA: NONE SEEN
Bilirubin Urine: NEGATIVE
Glucose, UA: NEGATIVE mg/dL
Hgb urine dipstick: NEGATIVE
Ketones, ur: NEGATIVE mg/dL
Nitrite: NEGATIVE
Protein, ur: NEGATIVE mg/dL
Specific Gravity, Urine: 1.026 (ref 1.005–1.030)
pH: 5 (ref 5.0–8.0)

## 2020-06-10 LAB — CBC
HCT: 37.8 % (ref 36.0–46.0)
Hemoglobin: 12.9 g/dL (ref 12.0–15.0)
MCH: 30.4 pg (ref 26.0–34.0)
MCHC: 34.1 g/dL (ref 30.0–36.0)
MCV: 89.2 fL (ref 80.0–100.0)
Platelets: 312 10*3/uL (ref 150–400)
RBC: 4.24 MIL/uL (ref 3.87–5.11)
RDW: 13.6 % (ref 11.5–15.5)
WBC: 13.9 10*3/uL — ABNORMAL HIGH (ref 4.0–10.5)
nRBC: 0 % (ref 0.0–0.2)

## 2020-06-10 LAB — CREATININE, SERUM
Creatinine, Ser: 0.71 mg/dL (ref 0.44–1.00)
GFR calc Af Amer: >60 mL/min (ref >60)
GFR calc non Af Amer: >60 mL/min (ref >60)

## 2020-06-10 LAB — TROPONIN I (HIGH SENSITIVITY)
Troponin I (High Sensitivity): 10 ng/L (ref <18)
Troponin I (High Sensitivity): 13 ng/L (ref <18)

## 2020-06-10 LAB — C-REACTIVE PROTEIN: CRP: 8.3 mg/dL — ABNORMAL HIGH (ref <1.0)

## 2020-06-10 LAB — FERRITIN: Ferritin: 149 ng/mL (ref 11–307)

## 2020-06-10 LAB — FIBRIN DERIVATIVES D-DIMER (ARMC ONLY): Fibrin derivatives D-dimer (ARMC): 393.06 ng/mL (FEU) (ref 0.00–499.00)

## 2020-06-10 MED ORDER — PREDNISONE 50 MG PO TABS: 50.0000 mg | ORAL_TABLET | Freq: Every day | ORAL | Status: DC

## 2020-06-10 MED ORDER — ACETAMINOPHEN 325 MG PO TABS
650.0000 mg | ORAL_TABLET | Freq: Four times a day (QID) | ORAL | Status: DC | PRN
  Administered 2020-06-10 – 2020-06-11 (×2): 650 mg via ORAL
  Filled 2020-06-10 (×2): qty 2

## 2020-06-10 MED ORDER — ZINC SULFATE 220 (50 ZN) MG PO CAPS
220.0000 mg | ORAL_CAPSULE | Freq: Every day | ORAL | Status: DC
  Administered 2020-06-10 – 2020-06-12 (×3): 220 mg via ORAL
  Filled 2020-06-10 (×3): qty 1

## 2020-06-10 MED ORDER — ALBUTEROL SULFATE HFA 108 (90 BASE) MCG/ACT IN AERS
2.0000 | INHALATION_SPRAY | Freq: Four times a day (QID) | RESPIRATORY_TRACT | Status: DC
  Administered 2020-06-10 – 2020-06-12 (×8): 2 via RESPIRATORY_TRACT
  Filled 2020-06-10: qty 6.7

## 2020-06-10 MED ORDER — PRAVASTATIN SODIUM 20 MG PO TABS
20.0000 mg | ORAL_TABLET | Freq: Every day | ORAL | Status: DC
  Administered 2020-06-10: 20 mg via ORAL
  Filled 2020-06-10 (×2): qty 1

## 2020-06-10 MED ORDER — SENNOSIDES-DOCUSATE SODIUM 8.6-50 MG PO TABS: 1.0000 | ORAL_TABLET | Freq: Every evening | ORAL | Status: DC | PRN

## 2020-06-10 MED ORDER — ADULT MULTIVITAMIN W/MINERALS CH
1.0000 | ORAL_TABLET | Freq: Every day | ORAL | Status: DC
  Administered 2020-06-11 – 2020-06-12 (×2): 1 via ORAL
  Filled 2020-06-10 (×4): qty 1

## 2020-06-10 MED ORDER — GUAIFENESIN-DM 100-10 MG/5ML PO SYRP
10.0000 mL | ORAL_SOLUTION | ORAL | Status: DC | PRN
  Filled 2020-06-10: qty 10

## 2020-06-10 MED ORDER — HYDROCOD POLST-CPM POLST ER 10-8 MG/5ML PO SUER: 5.0000 mL | Freq: Two times a day (BID) | ORAL | Status: DC | PRN

## 2020-06-10 MED ORDER — ADULT MULTIVITAMIN W/MINERALS CH
1.0000 | ORAL_TABLET | Freq: Every day | ORAL | Status: DC
  Administered 2020-06-10: 1 via ORAL

## 2020-06-10 MED ORDER — SODIUM CHLORIDE 0.9 % IV SOLN
100.0000 mg | Freq: Every day | INTRAVENOUS | Status: DC
  Administered 2020-06-11 – 2020-06-12 (×2): 100 mg via INTRAVENOUS
  Filled 2020-06-10 (×3): qty 20

## 2020-06-10 MED ORDER — ENOXAPARIN SODIUM 40 MG/0.4ML ~~LOC~~ SOLN
40.0000 mg | SUBCUTANEOUS | Status: DC
  Administered 2020-06-10 – 2020-06-12 (×3): 40 mg via SUBCUTANEOUS
  Filled 2020-06-10 (×3): qty 0.4

## 2020-06-10 MED ORDER — SODIUM CHLORIDE 0.9 % IV SOLN
200.0000 mg | Freq: Once | INTRAVENOUS | Status: AC
  Administered 2020-06-10: 200 mg via INTRAVENOUS
  Filled 2020-06-10: qty 200

## 2020-06-10 MED ORDER — METHYLPREDNISOLONE SODIUM SUCC 40 MG IJ SOLR
0.5000 mg/kg | Freq: Two times a day (BID) | INTRAMUSCULAR | Status: DC
  Administered 2020-06-10 – 2020-06-12 (×5): 34.8 mg via INTRAVENOUS
  Filled 2020-06-10 (×5): qty 1

## 2020-06-10 MED ORDER — CALCIUM CITRATE-VITAMIN D 500-500 MG-UNIT PO CHEW
1.0000 | CHEWABLE_TABLET | Freq: Two times a day (BID) | ORAL | Status: DC
  Administered 2020-06-10 – 2020-06-11 (×3): 1 via ORAL
  Filled 2020-06-10 (×6): qty 1

## 2020-06-10 MED ORDER — ASPIRIN EC 81 MG PO TBEC
81.0000 mg | DELAYED_RELEASE_TABLET | Freq: Every day | ORAL | Status: DC
  Administered 2020-06-10 – 2020-06-12 (×3): 81 mg via ORAL
  Filled 2020-06-10 (×3): qty 1

## 2020-06-10 MED ORDER — ASCORBIC ACID 500 MG PO TABS
500.0000 mg | ORAL_TABLET | Freq: Every day | ORAL | Status: DC
  Administered 2020-06-10 – 2020-06-12 (×3): 500 mg via ORAL
  Filled 2020-06-10 (×3): qty 1

## 2020-06-10 MED ORDER — MONTELUKAST SODIUM 10 MG PO TABS
10.0000 mg | ORAL_TABLET | Freq: Every day | ORAL | Status: DC
  Administered 2020-06-10 – 2020-06-11 (×2): 10 mg via ORAL
  Filled 2020-06-10 (×3): qty 1

## 2020-06-10 NOTE — H&P (Addendum)
History and Physical    Brooke Cantrell TKZ:[id number] DOB: Jan 26, 1946 DOA: 06/10/2020  PCP: Dion Body, MD   Patient coming from: Home  I have personally briefly reviewed patient's old medical records in [place]  Chief Complaint: Shortness of breath, Covid positive  HPI: Brooke Cantrell is a 74 y.o. female with medical history significant for breast cancer, depression, kidney stones, with recent discovery of multiple pulmonary nodules followed by pulmonologist and awaiting further evaluation with PET scan, who presents with weakness and shortness of breath as well as congestive cough starting a week ago after returning from a family trip to [place] with several unvaccinated relatives.  Patient completed her COVID-19 vaccines in February 2021.  She has associated left-sided chest pain that is worse when she takes a deep breath.  Denies fevers or chills.  Has no nausea, vomiting, diarrhea or abdominal pain.  Patient went to [facility] clinic for monoclonal antibody infusion on the day of arrival breast was advised to come to the emergency room as she was tachypneic and hypoxic requiring 3 L ED Course: On arrival to the hospital O2 sat was in the mid 90s on 3 L.  Temperature 98.6, tachypneic at 24, not tachycardic.  BP 124/80.  Ferritin and D-dimer within normal limits troponin normal at 10 other blood work unremarkable.  Chest x-ray showed interval development of patchy bilateral interstitial airspace opacities compared to chest x-ray a week prior. EKG as reviewed by me : Normal sinus rhythm with no acute ST-T wave changes   Review of Systems: As per HPI otherwise all other systems on review of systems negative.    Past Medical History:  Diagnosis Date  . Arthritis   . Breast cancer ([place])    Left breast  . Depression    "mild", no meds  . History of kidney stones     Past Surgical History:  Procedure Laterality Date  . breast cancer surgery    . BREAST ENHANCEMENT SURGERY     . BREAST LUMPECTOMY Left   . CATARACT EXTRACTION, BILATERAL    . ECTOPIC PREGNANCY SURGERY    . EYE SURGERY    . INNER EAR SURGERY    . TONSILLECTOMY    . TOTAL SHOULDER ARTHROPLASTY Left 04/25/2018  . TOTAL SHOULDER ARTHROPLASTY Left 04/25/2018   Procedure: LEFT TOTAL SHOULDER REPLACEMENT;  Surgeon: Meredith Pel, MD;  Location: [facility];  Service: Orthopedics;  Laterality: Left;  . TUBAL LIGATION       reports that she quit smoking about 23 years ago. She has never used smokeless tobacco. She reports current alcohol use of about 5.0 - 7.0 standard drinks of alcohol per week. She reports that she does not use drugs.  Allergies  Allergen Reactions  . Ciprofloxacin Itching and Rash    Family History  Problem Relation Age of Onset  . Allergic rhinitis Father   . Allergic rhinitis Sister   . Allergic rhinitis Brother       Prior to Admission medications   Medication Sig Start Date End Date Taking? Authorizing Provider  aspirin 81 MG EC tablet TAKE 1 TABLET (81 MG TOTAL) BY MOUTH 2 (TWO) TIMES DAILY. 04/26/18   Meredith Pel, MD  CALCIUM-VITAMIN D PO Take 1 tablet by mouth daily.    [provider]  Cranberry 450 MG CAPS Take 450 capsules by mouth daily.     [provider]  gabapentin (NEURONTIN) 300 MG capsule TAKE ONE CAPSULE BY MOUTH WITH BREAKFAST AND  LUNCH AND TAKE 2 CAPSULES AT BEDTIME Patient taking differently: TAKE 600MG  BY MOUTH AT BEDTIME 07/09/17   Mcarthur Rossetti, MD  montelukast (SINGULAIR) 10 MG tablet TAKE ONE TABLET ONCE DAILY AS DIRECTED 09/15/15   Kozlow, Donnamarie Poag, MD  Multiple Vitamin (MULTIVITAMIN) capsule Take 1 capsule by mouth daily.    [provider]  nitrofurantoin (MACRODANTIN) 100 MG capsule Take 100 mg by mouth daily. Take one capsule by mouth every day or less, after intercourse, to prevent infection 11/12/18   [provider]    Physical Exam: Vitals:   06/09/20 1934 06/09/20 2200 06/10/20 0024  06/10/20 0200  BP: (!) 152/89 127/87 130/88 134/73  Pulse: 75 96 91 77  Resp: 20 20 18 15   Temp: 97.8 F (36.6 C) 97.8 F (36.6 C) 98 F (36.7 C)   TempSrc: Axillary  Axillary   SpO2: 97% 91% 90% 96%  Weight:      Height:         Vitals:   06/09/20 1934 06/09/20 2200 06/10/20 0024 06/10/20 0200  BP: (!) 152/89 127/87 130/88 134/73  Pulse: 75 96 91 77  Resp: 20 20 18 15   Temp: 97.8 F (36.6 C) 97.8 F (36.6 C) 98 F (36.7 C)   TempSrc: Axillary  Axillary   SpO2: 97% 91% 90% 96%  Weight:      Height:          Constitutional: Alert and oriented x 3 .  Increased work of breathing, very breathless with conversation and unable to speak in complete sentences  HEENT:      Head: Normocephalic and atraumatic.         Eyes: PERLA, EOMI, Conjunctivae are normal. Sclera is non-icteric.       Mouth/Throat: Mucous membranes are moist.       Neck: Supple with no signs of meningismus. Cardiovascular: Regular rate and rhythm. No murmurs, gallops, or rubs. 2+ symmetrical distal pulses are present . No JVD. No LE edema Respiratory: Respiratory effort increased with coarse breath sounds throughout both lung fields anteriorly and posteriorly with high and low pitched wheezes Gastrointestinal: Soft, non tender, and non distended with positive bowel sounds. No rebound or guarding. Genitourinary: No CVA tenderness. Musculoskeletal: Nontender with normal range of motion in all extremities. No cyanosis, or erythema of extremities. Neurologic:  Face is symmetric. Moving all extremities. No gross focal neurologic deficits . Skin: Skin is warm, dry.  No rash or ulcers Psychiatric: Mood and affect are normal    Labs on Admission: I have personally reviewed following labs and imaging studies  CBC: Recent Labs  Lab 06/09/20 1312  WBC 15.6*  HGB 13.9  HCT 40.1  MCV 88.3  PLT 706   Basic Metabolic Panel: Recent Labs  Lab 06/09/20 1312  NA 137  K 3.7  CL 103  CO2 24  GLUCOSE 131*    BUN 23  CREATININE 0.85  CALCIUM 9.1   GFR: Estimated Creatinine Clearance: 56.6 mL/min (by C-G formula based on SCr of 0.85 mg/dL). Liver Function Tests: Recent Labs  Lab 06/09/20 1312  AST 23  ALT 24  ALKPHOS 39  BILITOT 1.1  PROT 7.0  ALBUMIN 3.1*   No results for input(s): LIPASE, AMYLASE in the last 168 hours. No results for input(s): AMMONIA in the last 168 hours. Coagulation Profile: No results for input(s): INR, PROTIME in the last 168 hours. Cardiac Enzymes: No results for input(s): CKTOTAL, CKMB, CKMBINDEX, TROPONINI in the last 168 hours. BNP (last  3 results) No results for input(s): PROBNP in the last 8760 hours. HbA1C: No results for input(s): HGBA1C in the last 72 hours. CBG: No results for input(s): GLUCAP in the last 168 hours. Lipid Profile: No results for input(s): CHOL, HDL, LDLCALC, TRIG, CHOLHDL, LDLDIRECT in the last 72 hours. Thyroid Function Tests: No results for input(s): TSH, T4TOTAL, FREET4, T3FREE, THYROIDAB in the last 72 hours. Anemia Panel: Recent Labs    06/10/20 0200  FERRITIN 149   Urine analysis:    Component Value Date/Time   COLORURINE YELLOW (A) 04/02/2019 1612   APPEARANCEUR CLEAR (A) 04/02/2019 1612   APPEARANCEUR Clear 08/31/2015 1603   LABSPEC 1.021 04/02/2019 1612   LABSPEC 1.020 05/05/2011 1203   PHURINE 5.0 04/02/2019 1612   GLUCOSEU NEGATIVE 04/02/2019 1612   HGBUR NEGATIVE 04/02/2019 1612   BILIRUBINUR NEGATIVE 04/02/2019 1612   BILIRUBINUR Negative 08/31/2015 1603   BILIRUBINUR Negative 05/05/2011 1203   KETONESUR 20 (A) 04/02/2019 1612   PROTEINUR NEGATIVE 04/02/2019 1612   NITRITE NEGATIVE 04/02/2019 1612   LEUKOCYTESUR NEGATIVE 04/02/2019 1612   LEUKOCYTESUR Negative 05/05/2011 1203    Radiological Exams on Admission: DG Chest 2 View  Result Date: 06/09/2020 CLINICAL DATA:  Short of breath, COVID positive patient who underwent antibody infusion today. EXAM: CHEST - 2 VIEW COMPARISON:  Prior chest x-ray  04/02/2019 FINDINGS: Slightly increased interstitial opacities in the bilateral lower lungs, particularly the periphery of the right base compared to prior imaging from 2020. Additionally, there are subtle areas of interstitial airspace opacity in the left mid lung. Changes are superimposed on a background of chronic bronchitic changes and hyperinflation. Bilateral calcified breast augmentation prostheses. Left-sided reverse shoulder arthroplasty. No pneumothorax. No acute osseous abnormality. IMPRESSION: 1. Interval development of a bilateral patchy interstitial airspace opacities compared to prior imaging. Given known history of COVID positivity, findings are concerning for multifocal viral pneumonia. Electronically Signed   By: Jacqulynn Cadet M.D.   On: 06/09/2020 13:39     Assessment/Plan 74 year old female with history of breast cancer, depression, kidney stones, with recent discovery of multiple pulmonary nodules, Covid positive presenting with respiratory symptoms   Pneumonia due to COVID-19 virus   Acute respiratory failure due to COVID-19 [facility]) -Patient fully vaccinated in February developing symptoms following a trip to [place] testing positive for COVID-19 on 9/28 -Patient went for monoclonal antibodies on 06/09/2020 but diaphoretic to ER due to hypoxia requiring 3 L to keep sats mid 90s, now with increased work of breathing, worsening findings on chest x-ray compared to a week prior -Remdesivir, methylprednisolone, albuterol, antitussives and vitamins -Supplemental oxygen and proning as tolerated -Follow inflammatory biomarkers  Pleuritic chest pain -In the setting of COVID-19 and chest pain will get CTA chest -Follow-up CTA to evaluate for PE -Troponin was normal.  EKG showed no acute ST-T wave changes    History of breast cancer -No acute concerns at this time    Hx of multiple pulmonary nodules -Patient follows with pulmonology.  Currently undergoing work-up with PET scan to  be scheduled  DVT prophylaxis: Lovenox  Code Status: full code  Family Communication:  none  Disposition Plan: Back to previous home environment Consults called: none  Status:At the time of admission, it appears that the appropriate admission status for this patient is INPATIENT. This is judged to be reasonable and necessary in order to provide the required intensity of service to ensure the patient's safety given the presenting symptoms, physical exam findings, and initial radiographic and laboratory data in the  context of their  Comorbid conditions.   Patient requires inpatient status due to high intensity of service, high risk for further deterioration and high frequency of surveillance required.   I certify that at the point of admission it is my clinical judgment that the patient will require inpatient hospital care spanning beyond [place] MD [facility]     06/10/2020, 3:38 AM

## 2020-06-10 NOTE — ED Notes (Signed)
Pt ambulatory to restroom with one assist. Slightly unsteady when first standing but then able to ambulate.

## 2020-06-10 NOTE — Plan of Care (Signed)

## 2020-06-10 NOTE — Progress Notes (Signed)
Patient ID: Brooke Cantrell, female   DOB: Jan 13, 1946, 74 y.o.   MRN: [id number] [facility] PROGRESS NOTE  Brooke Cantrell MCN:[id number] DOB: 1945/09/17 DOA: 06/10/2020 PCP: Dion Body, MD  HPI/Subjective: Patient coming in with weakness, shortness of breath and cough.  She was diagnosed with Covid on 06/03/2020.  She was on steroids as outpatient.  She is fully vaccinated with the [place] vaccine.  Currently on just less than 2 L of oxygen.  Objective: Vitals:   06/10/20 0900 06/10/20 1035  BP: 131/81 124/68  Pulse: 64 82  Resp: 12 16  Temp:  98 F (36.7 C)  SpO2: 94% 93%   No intake or output data in the 24 hours ending 06/10/20 1217 Filed Weights   06/09/20 1303  Weight: 69.9 kg    ROS: Review of Systems  Constitutional: Positive for malaise/fatigue.  Respiratory: Positive for cough and shortness of breath.   Cardiovascular: Negative for chest pain.  Gastrointestinal: Negative for abdominal pain, nausea and vomiting.   Exam: Physical Exam HENT:     Head: Normocephalic.     Mouth/Throat:     Pharynx: No oropharyngeal exudate.  Eyes:     General: Lids are normal.     Conjunctiva/sclera: Conjunctivae normal.  Cardiovascular:     Rate and Rhythm: Normal rate and regular rhythm.     Heart sounds: Normal heart sounds, S1 normal and S2 normal.  Pulmonary:     Breath sounds: Examination of the right-middle field reveals decreased breath sounds. Examination of the left-middle field reveals decreased breath sounds. Examination of the right-lower field reveals decreased breath sounds and rhonchi. Examination of the left-lower field reveals decreased breath sounds and rhonchi. Decreased breath sounds and rhonchi present. No rales.  Abdominal:     Palpations: Abdomen is soft.     Tenderness: There is no abdominal tenderness.  Musculoskeletal:     Right ankle: No swelling.     Left ankle: No swelling.  Skin:    General: Skin is warm.     Findings: No rash.   Neurological:     Mental Status: She is alert and oriented to person, place, and time.       Data Reviewed: Basic Metabolic Panel: Recent Labs  Lab 06/09/20 1312 06/10/20 0436  NA 137  --   K 3.7  --   CL 103  --   CO2 24  --   GLUCOSE 131*  --   BUN 23  --   CREATININE 0.85 0.71  CALCIUM 9.1  --    Liver Function Tests: Recent Labs  Lab 06/09/20 1312  AST 23  ALT 24  ALKPHOS 39  BILITOT 1.1  PROT 7.0  ALBUMIN 3.1*   CBC: Recent Labs  Lab 06/09/20 1312 06/10/20 0436  WBC 15.6* 13.9*  HGB 13.9 12.9  HCT 40.1 37.8  MCV 88.3 89.2  PLT 310 312     Studies: DG Chest 2 View  Result Date: 06/09/2020 CLINICAL DATA:  Short of breath, COVID positive patient who underwent antibody infusion today. EXAM: CHEST - 2 VIEW COMPARISON:  Prior chest x-ray 04/02/2019 FINDINGS: Slightly increased interstitial opacities in the bilateral lower lungs, particularly the periphery of the right base compared to prior imaging from 2020. Additionally, there are subtle areas of interstitial airspace opacity in the left mid lung. Changes are superimposed on a background of chronic bronchitic changes and hyperinflation. Bilateral calcified breast augmentation prostheses. Left-sided reverse shoulder arthroplasty. No pneumothorax. No acute osseous abnormality. IMPRESSION: 1.  Interval development of a bilateral patchy interstitial airspace opacities compared to prior imaging. Given known history of COVID positivity, findings are concerning for multifocal viral pneumonia. Electronically Signed   By: Jacqulynn Cadet M.D.   On: 06/09/2020 13:39    Scheduled Meds: . albuterol  2 puff Inhalation Q6H  . vitamin C  500 mg Oral Daily  . aspirin EC  81 mg Oral Daily  . calcium citrate-vitamin D  1 tablet Oral BID WC  . enoxaparin (LOVENOX) injection  40 mg Subcutaneous Q24H  . methylPREDNISolone (SOLU-MEDROL) injection  0.5 mg/kg Intravenous Q12H   Followed by  . [START ON 06/13/2020] predniSONE   50 mg Oral Daily  . montelukast  10 mg Oral QHS  . multivitamin with minerals  1 tablet Oral Daily  . multivitamin with minerals  1 tablet Oral Daily  . pravastatin  20 mg Oral q1800  . zinc sulfate  220 mg Oral Daily   Continuous Infusions: . [START ON 06/11/2020] remdesivir 100 mg in NS 100 mL      Assessment/Plan:  1. Acute hypoxic respiratory failure secondary to COVID-19 pneumonia.  Has been vaccinated by [employer] vaccine.  Patient currently on around 2 L of oxygen.  She was hypoxic in the emergency room requiring 3 L to saturate in the low 90s.  Chest x-ray positive for Covid pneumonia.  Started remdesivir day 1 today.  Continue methylprednisone albuterol inhaler vitamin C and zinc.  Follow inflammatory markers.  First fibrin derivatives loaned out pulmonary embolism.  CRP elevated at 8.3.  We will follow the trend. 2. Hyperlipidemia unspecified on pravastatin 3. Prior history of breast cancer      Code Status:     Code Status Orders  (From admission, onward)         Start     Ordered   06/10/20 0336  Full code  Continuous        06/10/20 0338        Code Status History    Date Active Date Inactive Code Status Order ID Comments User Context   04/03/2019 0009 04/03/2019 1938 Full Code [id number]  Lance Coon, MD Inpatient   04/25/2018 1416 04/26/2018 2013 Full Code [id number]  Meredith Pel, MD Inpatient   Advance Care Planning Activity     Family Communication: Left message for husband on the phone Disposition Plan: Status is: Inpatient  Dispo: The patient is from: Home              Anticipated d/c is to: Home              Anticipated d/c date is: Day 1 of 5 of remdesivir.  If patient able to come off oxygen and doing better prior to the 5 days can consider finishing up course as outpatient but will evaluate on a daily basis.              Patient currently being treated for acute hypoxic respiratory failure with COVID-19 pneumonia.  Time spent: 34  minutes  [place]

## 2020-06-10 NOTE — ED Provider Notes (Signed)
[facility] Emergency Department Provider Note   ____________________________________________   First MD Initiated Contact with Patient 06/10/20 0100     (approximate)  I have reviewed the triage vital signs and the nursing notes.   HISTORY  Chief Complaint Weakness    HPI Brooke Cantrell is a 74 y.o. female with past medical history of breast cancer and kidney stones who presents to the ED complaining of weakness and shortness of breath.  Patient states she tested positive for COVID-19 last week after a family trip to [place].  Since then, she has been feeling increasingly weak with worsening difficulty breathing.  She also reports some pain along the left side of her chest that is worse when she goes to take a deep breath.  She denies any fevers, nausea, vomiting, diarrhea, or abdominal pain.  She has been fully vaccinated against COVID-19.        Past Medical History:  Diagnosis Date  . Arthritis   . Breast cancer ([place])    Left breast  . Depression    "mild", no meds  . History of kidney stones     Patient Active Problem List   Diagnosis Date Noted  . Sepsis ([place]) 04/02/2019  . CAP (community acquired pneumonia) 04/02/2019  . Elevated troponin 04/02/2019  . Shoulder arthritis 04/25/2018  . 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 Chronic left shoulder pain 05/17/2017  . 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 Chronic infection of sinus 08/31/2015  . Cough 08/31/2015  . Allergic reaction 08/31/2015    Past Surgical History:  Procedure Laterality Date  . breast cancer surgery    . BREAST ENHANCEMENT SURGERY    . BREAST LUMPECTOMY Left   . CATARACT EXTRACTION, BILATERAL    . ECTOPIC PREGNANCY SURGERY    . EYE SURGERY    . INNER EAR SURGERY    . TONSILLECTOMY    . TOTAL SHOULDER ARTHROPLASTY Left 04/25/2018  . TOTAL SHOULDER ARTHROPLASTY Left 04/25/2018   Procedure: LEFT TOTAL SHOULDER REPLACEMENT;  Surgeon:  Meredith Pel, MD;  Location: [place];  Service: Orthopedics;  Laterality: Left;  . TUBAL LIGATION      Prior to Admission medications   Medication Sig Start Date End Date Taking? Authorizing Provider  aspirin 81 MG EC tablet TAKE 1 TABLET (81 MG TOTAL) BY MOUTH 2 (TWO) TIMES DAILY. 04/26/18   Meredith Pel, MD  CALCIUM-VITAMIN D PO Take 1 tablet by mouth daily.    [provider]  Cranberry 450 MG CAPS Take 450 capsules by mouth daily.     [provider]  gabapentin (NEURONTIN) 300 MG capsule TAKE ONE CAPSULE BY MOUTH WITH BREAKFAST AND LUNCH AND TAKE 2 CAPSULES AT BEDTIME Patient taking differently: TAKE 600MG  BY MOUTH AT BEDTIME 07/09/17   Mcarthur Rossetti, MD  montelukast (SINGULAIR) 10 MG tablet TAKE ONE TABLET ONCE DAILY AS DIRECTED 09/15/15   Kozlow, Donnamarie Poag, MD  Multiple Vitamin (MULTIVITAMIN) capsule Take 1 capsule by mouth daily.    [provider]  nitrofurantoin (MACRODANTIN) 100 MG capsule Take 100 mg by mouth daily. Take one capsule by mouth every day or less, after intercourse, to prevent infection 11/12/18   [provider]    Allergies Ciprofloxacin  Family History  Problem Relation Age of Onset  . Allergic rhinitis Father   . Allergic rhinitis Sister   . Allergic rhinitis Brother     Social History Social History   Tobacco Use  .  Smoking status: Former Smoker    Quit date: 09/11/1996    Years since quitting: 23.7  . Smokeless tobacco: Never Used  Vaping Use  . Vaping Use: Never used  Substance Use Topics  . Alcohol use: Yes    Alcohol/week: 5.0 - 7.0 standard drinks    Types: 5 - 7 Standard drinks or equivalent per week    Comment: 5-7 per week, when going out eat  . Drug use: Never    Review of Systems  Constitutional: No fever/chills.  Positive for generalized weakness. Eyes: No visual changes. ENT: No sore throat. Cardiovascular: Positive for chest pain. Respiratory: Positive for cough and shortness  of breath. Gastrointestinal: No abdominal pain.  No nausea, no vomiting.  No diarrhea.  No constipation. Genitourinary: Negative for dysuria. Musculoskeletal: Negative for back pain. Skin: Negative for rash. Neurological: Negative for headaches, focal weakness or numbness.  ____________________________________________   PHYSICAL EXAM:  VITAL SIGNS: ED Triage Vitals  Enc Vitals Group     BP 06/09/20 1304 124/80     Pulse Rate 06/09/20 1304 89     Resp 06/09/20 1304 (!) 24     Temp 06/09/20 1304 98.6 F (37 C)     Temp Source 06/09/20 1304 Oral     SpO2 06/09/20 1304 90 %     Weight 06/09/20 1303 154 lb (69.9 kg)     Height 06/09/20 1303 5\' 4"  (1.626 m)     Head Circumference --      Peak Flow --      Pain Score 06/09/20 1303 0     Pain Loc --      Pain Edu? --      Excl. in [place]? --     Constitutional: Alert and oriented. Eyes: Conjunctivae are normal. Head: Atraumatic. Nose: No congestion/rhinnorhea. Mouth/Throat: Mucous membranes are moist. Neck: Normal ROM Cardiovascular: Normal rate, regular rhythm. Grossly normal heart sounds. Respiratory: Tachypneic with increased respiratory effort.  No retractions. Lungs CTAB. Gastrointestinal: Soft and nontender. No distention. Genitourinary: deferred Musculoskeletal: No lower extremity tenderness nor edema. Neurologic:  Normal speech and language. No gross focal neurologic deficits are appreciated. Skin:  Skin is warm, dry and intact. No rash noted. Psychiatric: Mood and affect are normal. Speech and behavior are normal.  ____________________________________________   LABS (all labs ordered are listed, but only abnormal results are displayed)  Labs Reviewed  CBC - Abnormal; Notable for the following components:      Result Value   WBC 15.6 (*)    All other components within normal limits  COMPREHENSIVE METABOLIC PANEL - Abnormal; Notable for the following components:   Glucose, Bld 131 (*)    Albumin 3.1 (*)    All  other components within normal limits  FIBRIN DERIVATIVES D-DIMER ([facility] ONLY)  FERRITIN  URINALYSIS, COMPLETE (UACMP) WITH MICROSCOPIC  C-REACTIVE PROTEIN  CBG MONITORING, ED  TROPONIN I (HIGH SENSITIVITY)   ____________________________________________  EKG  ED ECG REPORT I, Blake Divine, the attending physician, personally viewed and interpreted this ECG.   Date: 06/10/2020  EKG Time: 13:08  Rate: 88  Rhythm: normal sinus rhythm  Axis: Normal  Intervals:none  ST&T Change: None   PROCEDURES  Procedure(s) performed (including Critical Care):  .Critical Care Performed by: Blake Divine, MD Authorized by: Blake Divine, MD   Critical care provider statement:    Critical care time (minutes):  45   Critical care time was exclusive of:  Separately billable procedures and treating other patients and teaching time  Critical care was necessary to treat or prevent imminent or life-threatening deterioration of the following conditions:  Respiratory failure   Critical care was time spent personally by me on the following activities:  Discussions with consultants, evaluation of patient's response to treatment, examination of patient, ordering and performing treatments and interventions, ordering and review of laboratory studies, ordering and review of radiographic studies, pulse oximetry, re-evaluation of patient's condition, obtaining history from patient or surrogate and review of old charts   I assumed direction of critical care for this patient from another provider in my specialty: no       ____________________________________________   INITIAL IMPRESSION / ASSESSMENT AND PLAN / ED COURSE       74 year old female with past medical history of breast cancer and kidney stones who presents to the ED complaining of increasing weakness and difficulty breathing after testing positive for COVID-19 1 week ago.  On my assessment she has increased work of breathing with  tachypnea, reportedly had O2 sats that were low on room air but now improved on 3 L nasal cannula.  Chest x-ray is consistent with COVID-19 and viral pneumonia, lab work thus far reassuring.  EKG shows no evidence of arrhythmia or ischemia, will add on troponin given her chest pain.  Anticipate admission for COVID-19 pneumonia and hypoxic respiratory failure.  Troponin is within normal limits, I doubt ACS.  Patient remained stable on 3 L nasal cannula and case was discussed with hospitalist for admission.      ____________________________________________   FINAL CLINICAL IMPRESSION(S) / ED DIAGNOSES  Final diagnoses:  Pneumonia due to COVID-19 virus  Acute respiratory failure with hypoxia [facility])     ED Discharge Orders    None       Note:  This document was prepared using Dragon voice recognition software and may include unintentional dictation errors.   Blake Divine, MD 06/10/20 [phone]

## 2020-06-10 NOTE — Progress Notes (Signed)
Remdesivir - Pharmacy Brief Note   A/P:  Remdesivir 200 mg IVPB once followed by 100 mg IVPB daily x 4 days.   Hart Robinsons, PharmD Clinical Pharmacist   06/10/2020 3:46 AM

## 2020-06-11 DIAGNOSIS — E785 Hyperlipidemia, unspecified: Secondary | ICD-10-CM

## 2020-06-11 LAB — CBC WITH DIFFERENTIAL/PLATELET
Abs Immature Granulocytes: 0.33 10*3/uL — ABNORMAL HIGH (ref 0.00–0.07)
Basophils Absolute: 0 10*3/uL (ref 0.0–0.1)
Basophils Relative: 0 %
Eosinophils Absolute: 0 10*3/uL (ref 0.0–0.5)
Eosinophils Relative: 0 %
HCT: 37.7 % (ref 36.0–46.0)
Hemoglobin: 13.3 g/dL (ref 12.0–15.0)
Immature Granulocytes: 3 %
Lymphocytes Relative: 11 %
Lymphs Abs: 1.4 10*3/uL (ref 0.7–4.0)
MCH: 30.8 pg (ref 26.0–34.0)
MCHC: 35.3 g/dL (ref 30.0–36.0)
MCV: 87.3 fL (ref 80.0–100.0)
Monocytes Absolute: 0.6 10*3/uL (ref 0.1–1.0)
Monocytes Relative: 5 %
Neutro Abs: 10.2 10*3/uL — ABNORMAL HIGH (ref 1.7–7.7)
Neutrophils Relative %: 81 %
Platelets: 313 10*3/uL (ref 150–400)
RBC: 4.32 MIL/uL (ref 3.87–5.11)
RDW: 13.3 % (ref 11.5–15.5)
WBC: 12.6 10*3/uL — ABNORMAL HIGH (ref 4.0–10.5)
nRBC: 0 % (ref 0.0–0.2)

## 2020-06-11 LAB — COMPREHENSIVE METABOLIC PANEL
ALT: 18 U/L (ref 0–44)
AST: 16 U/L (ref 15–41)
Albumin: 2.7 g/dL — ABNORMAL LOW (ref 3.5–5.0)
Alkaline Phosphatase: 36 U/L — ABNORMAL LOW (ref 38–126)
Anion gap: 9 (ref 5–15)
BUN: 23 mg/dL (ref 8–23)
CO2: 25 mmol/L (ref 22–32)
Calcium: 8.5 mg/dL — ABNORMAL LOW (ref 8.9–10.3)
Chloride: 103 mmol/L (ref 98–111)
Creatinine, Ser: 0.78 mg/dL (ref 0.44–1.00)
GFR calc Af Amer: >60 mL/min (ref >60)
GFR calc non Af Amer: >60 mL/min (ref >60)
Glucose, Bld: 191 mg/dL — ABNORMAL HIGH (ref 70–99)
Potassium: 4.9 mmol/L (ref 3.5–5.1)
Sodium: 137 mmol/L (ref 135–145)
Total Bilirubin: 0.7 mg/dL (ref 0.3–1.2)
Total Protein: 6.6 g/dL (ref 6.5–8.1)

## 2020-06-11 LAB — FIBRIN DERIVATIVES D-DIMER (ARMC ONLY): Fibrin derivatives D-dimer (ARMC): 346.99 ng/mL (FEU) (ref 0.00–499.00)

## 2020-06-11 LAB — C-REACTIVE PROTEIN: CRP: 7.8 mg/dL — ABNORMAL HIGH (ref <1.0)

## 2020-06-11 MED ORDER — CALCIUM CARBONATE-VITAMIN D 500-200 MG-UNIT PO TABS
1.0000 | ORAL_TABLET | Freq: Two times a day (BID) | ORAL | Status: DC
  Administered 2020-06-11 – 2020-06-12 (×2): 1 via ORAL
  Filled 2020-06-11 (×2): qty 1

## 2020-06-11 MED ORDER — SODIUM CHLORIDE 0.9 % IV SOLN
INTRAVENOUS | Status: DC | PRN
  Administered 2020-06-11 – 2020-06-12 (×2): 250 mL via INTRAVENOUS

## 2020-06-11 NOTE — Progress Notes (Signed)
Patient ID: Brooke Cantrell, female   DOB: [birth year], 74 y.o.   MRN: [id number] [facility] PROGRESS NOTE  MARYKATE HEUBERGER YPP:[id number] DOB: June 10, 1946 DOA: 06/10/2020 PCP: Dion Body, MD  HPI/Subjective: Patient feeling a little bit better today. With a real deep breath she does cough. Still with a little bit of shortness of breath and coughing. With ambulating this afternoon she felt weak like her legs were going to buckle. She was able to come off oxygen today.  Objective: Vitals:   06/11/20 1215 06/11/20 1640  BP: (!) 146/81 (!) 143/83  Pulse: 73 79  Resp: 20 (!) 22  Temp: 97.7 F (36.5 C) 98.5 F (36.9 C)  SpO2: 97% 96%    Intake/Output Summary (Last 24 hours) at 06/11/2020 1745 Last data filed at 06/11/2020 1600 Gross per 24 hour  Intake 115.37 ml  Output --  Net 115.37 ml   Filed Weights   06/09/20 1303  Weight: 69.9 kg    ROS: Review of Systems  Constitutional: Positive for malaise/fatigue.  Respiratory: Positive for cough and shortness of breath.   Cardiovascular: Negative for chest pain.  Gastrointestinal: Negative for abdominal pain, nausea and vomiting.   Exam: Physical Exam HENT:     Head: Normocephalic.     Mouth/Throat:     Pharynx: No oropharyngeal exudate.  Eyes:     General: Lids are normal.     Conjunctiva/sclera: Conjunctivae normal.  Cardiovascular:     Rate and Rhythm: Normal rate and regular rhythm.     Heart sounds: Normal heart sounds, S1 normal and S2 normal.  Pulmonary:     Breath sounds: Examination of the right-lower field reveals decreased breath sounds and rhonchi. Examination of the left-lower field reveals decreased breath sounds and rhonchi. Decreased breath sounds and rhonchi present. No wheezing or rales.  Abdominal:     Palpations: Abdomen is soft.     Tenderness: There is no abdominal tenderness.  Musculoskeletal:     Right lower leg: No swelling.     Left lower leg: No swelling.  Skin:    General: Skin is warm.      Findings: No rash.  Neurological:     Mental Status: She is alert and oriented to person, place, and time.       Data Reviewed: Basic Metabolic Panel: Recent Labs  Lab 06/09/20 1312 06/10/20 0436 06/11/20 0431  NA 137  --  137  K 3.7  --  4.9  CL 103  --  103  CO2 24  --  25  GLUCOSE 131*  --  191*  BUN 23  --  23  CREATININE 0.85 0.71 0.78  CALCIUM 9.1  --  8.5*   Liver Function Tests: Recent Labs  Lab 06/09/20 1312 06/11/20 0431  AST 23 16  ALT 24 18  ALKPHOS 39 36*  BILITOT 1.1 0.7  PROT 7.0 6.6  ALBUMIN 3.1* 2.7*   CBC: Recent Labs  Lab 06/09/20 1312 06/10/20 0436 06/11/20 0431  WBC 15.6* 13.9* 12.6*  NEUTROABS  --   --  10.2*  HGB 13.9 12.9 13.3  HCT 40.1 37.8 37.7  MCV 88.3 89.2 87.3  PLT 310 312 313    Scheduled Meds: . albuterol  2 puff Inhalation Q6H  . vitamin C  500 mg Oral Daily  . aspirin EC  81 mg Oral Daily  . calcium citrate-vitamin D  1 tablet Oral BID WC  . enoxaparin (LOVENOX) injection  40 mg Subcutaneous Q24H  . methylPREDNISolone (  SOLU-MEDROL) injection  0.5 mg/kg Intravenous Q12H   Followed by  . [START ON 06/13/2020] predniSONE  50 mg Oral Daily  . montelukast  10 mg Oral QHS  . multivitamin with minerals  1 tablet Oral Daily  . zinc sulfate  220 mg Oral Daily   Continuous Infusions: . sodium chloride Stopped (06/11/20 1112)  . remdesivir 100 mg in NS 100 mL Stopped (06/11/20 1103)    Assessment/Plan:  1. Acute hypoxic respiratory failure secondary to COVID-19 pneumonia. Patient was able to come off oxygen today. Continue remdesivir day 2. On Solu-Medrol. Fibrin derivatives are low. CRP trending better. 2. Weakness with ambulating. Will check orthostatic vital signs 3. Hyperlipidemia unspecified. Patient states that she gets cramps with the pravastatin so I will hold this at this point 4. Prior history of breast cancer     Code Status:     Code Status Orders  (From admission, onward)         Start      Ordered   06/10/20 0336  Full code  Continuous        06/10/20 0338        Code Status History    Date Active Date Inactive Code Status Order ID Comments User Context   04/03/2019 0009 04/03/2019 1938 Full Code [id number]  Lance Coon, MD Inpatient   04/25/2018 1416 04/26/2018 2013 Full Code [id number]  Meredith Pel, MD Inpatient   Advance Care Planning Activity     Family Communication: Spoke with husband on the phone Disposition Plan: Status is: Inpatient  Dispo: The patient is from: Home              Anticipated d/c is to: Home              Anticipated d/c date is: Evaluate on a day-to-day basis on when to go home              Patient currently being treated for COVID-19 pneumonia. Still has bronchospasm with taking a deep breath and was weak with ambulating today. Continue IV steroids.  Time spent: 28 minutes  [place]

## 2020-06-12 DIAGNOSIS — R531 Weakness: Secondary | ICD-10-CM

## 2020-06-12 LAB — CBC WITH DIFFERENTIAL/PLATELET
Abs Immature Granulocytes: 0.58 10*3/uL — ABNORMAL HIGH (ref 0.00–0.07)
Basophils Absolute: 0 10*3/uL (ref 0.0–0.1)
Basophils Relative: 0 %
Eosinophils Absolute: 0 10*3/uL (ref 0.0–0.5)
Eosinophils Relative: 0 %
HCT: 37.2 % (ref 36.0–46.0)
Hemoglobin: 13 g/dL (ref 12.0–15.0)
Immature Granulocytes: 3 %
Lymphocytes Relative: 9 %
Lymphs Abs: 1.5 10*3/uL (ref 0.7–4.0)
MCH: 30.4 pg (ref 26.0–34.0)
MCHC: 34.9 g/dL (ref 30.0–36.0)
MCV: 87.1 fL (ref 80.0–100.0)
Monocytes Absolute: 0.9 10*3/uL (ref 0.1–1.0)
Monocytes Relative: 6 %
Neutro Abs: 14 10*3/uL — ABNORMAL HIGH (ref 1.7–7.7)
Neutrophils Relative %: 82 %
Platelets: 339 10*3/uL (ref 150–400)
RBC: 4.27 MIL/uL (ref 3.87–5.11)
RDW: 13.2 % (ref 11.5–15.5)
WBC: 17.1 10*3/uL — ABNORMAL HIGH (ref 4.0–10.5)
nRBC: 0 % (ref 0.0–0.2)

## 2020-06-12 LAB — COMPREHENSIVE METABOLIC PANEL
ALT: 18 U/L (ref 0–44)
AST: 17 U/L (ref 15–41)
Albumin: 2.6 g/dL — ABNORMAL LOW (ref 3.5–5.0)
Alkaline Phosphatase: 36 U/L — ABNORMAL LOW (ref 38–126)
Anion gap: 9 (ref 5–15)
BUN: 20 mg/dL (ref 8–23)
CO2: 23 mmol/L (ref 22–32)
Calcium: 8.7 mg/dL — ABNORMAL LOW (ref 8.9–10.3)
Chloride: 102 mmol/L (ref 98–111)
Creatinine, Ser: 0.66 mg/dL (ref 0.44–1.00)
GFR calc Af Amer: >60 mL/min (ref >60)
GFR calc non Af Amer: >60 mL/min (ref >60)
Glucose, Bld: 169 mg/dL — ABNORMAL HIGH (ref 70–99)
Potassium: 4.1 mmol/L (ref 3.5–5.1)
Sodium: 134 mmol/L — ABNORMAL LOW (ref 135–145)
Total Bilirubin: 0.6 mg/dL (ref 0.3–1.2)
Total Protein: 6.3 g/dL — ABNORMAL LOW (ref 6.5–8.1)

## 2020-06-12 LAB — C-REACTIVE PROTEIN: CRP: 2.5 mg/dL — ABNORMAL HIGH (ref <1.0)

## 2020-06-12 LAB — FIBRIN DERIVATIVES D-DIMER (ARMC ONLY): Fibrin derivatives D-dimer (ARMC): 301.36 ng/mL (FEU) (ref 0.00–499.00)

## 2020-06-12 MED ORDER — HYDROCOD POLST-CPM POLST ER 10-8 MG/5ML PO SUER
5.0000 mL | Freq: Two times a day (BID) | ORAL | 0 refills | Status: DC | PRN
Start: 2020-06-12 — End: 2024-04-22

## 2020-06-12 MED ORDER — MONTELUKAST SODIUM 10 MG PO TABS: 10.0000 mg | ORAL_TABLET | Freq: Every day | ORAL | Status: DC

## 2020-06-12 MED ORDER — ZINC SULFATE 220 (50 ZN) MG PO CAPS: 220.0000 mg | ORAL_CAPSULE | Freq: Every day | ORAL | 0 refills | Status: DC

## 2020-06-12 MED ORDER — ALBUTEROL SULFATE HFA 108 (90 BASE) MCG/ACT IN AERS: 2.0000 | INHALATION_SPRAY | Freq: Four times a day (QID) | RESPIRATORY_TRACT | 0 refills | Status: DC

## 2020-06-12 MED ORDER — PREDNISONE 20 MG PO TABS: ORAL_TABLET | ORAL | 0 refills | Status: DC

## 2020-06-12 MED ORDER — ASCORBIC ACID 500 MG PO TABS: 500.0000 mg | ORAL_TABLET | Freq: Every day | ORAL | 0 refills | Status: DC

## 2020-06-12 NOTE — Discharge Summary (Signed)
[facility] NAME: Brooke Cantrell    MR#:  [id number]  DATE OF BIRTH:  06-04-1946  DATE OF ADMISSION:  06/10/2020 ADMITTING PHYSICIAN: Athena Masse, MD  DATE OF DISCHARGE: 06/12/2020  4:02 PM  PRIMARY CARE PHYSICIAN: Dion Body, MD    ADMISSION DIAGNOSIS:  Acute respiratory failure with hypoxia ([place]) [J96.01] Acute respiratory failure due to COVID-19 ([facility]) [U07.1, J96.00] Pneumonia due to COVID-19 virus [U07.1, J12.82]  DISCHARGE DIAGNOSIS:  Principal Problem:   Pneumonia due to COVID-19 virus Active Problems:   Acute respiratory failure due to COVID-19 [facility])   History of breast cancer   Hx of multiple pulmonary nodules   Acute respiratory failure with hypoxia ([place])   Hyperlipidemia   SECONDARY DIAGNOSIS:   Past Medical History:  Diagnosis Date  . Arthritis   . Breast cancer ([place])    Left breast  . Depression    "mild", no meds  . History of kidney stones     HOSPITAL COURSE:   1. Acute hypoxic respiratory failure secondary to COVID-19 pneumonia. The patient was able to come off oxygen yesterday. He was feeling better today. Still feeling weak but was able to walk around the nursing station again today and hold her saturations. Finished remdesivir day three today. Patient was set up with outpatient remdesivir for two more doses on Sunday and Monday. Switch to prednisone for seven more days. Continue vitamin C and zinc and albuterol inhaler. CRP trended better down to 2.5 today. Fibrin derivatives decreased little bit down to 3-1.36 2. Weakness with ambulating. Did better today with ambulation. 3. Hyperlipidemia unspecified. Holding statin at this time. 4. Prior history of breast cancer.  DISCHARGE CONDITIONS:   Satisfactory  CONSULTS OBTAINED:  None  DRUG ALLERGIES:   Allergies  Allergen Reactions  . Ciprofloxacin Itching, Rash and Hives    DISCHARGE MEDICATIONS:   Allergies as of 06/12/2020       Reactions   Ciprofloxacin Itching, Rash, Hives      Medication List    STOP taking these medications   pravastatin 20 MG tablet Commonly known as: PRAVACHOL     TAKE these medications   albuterol 108 (90 Base) MCG/ACT inhaler Commonly known as: VENTOLIN HFA Inhale 2 puffs into the lungs every 6 (six) hours.   ascorbic acid 500 MG tablet Commonly known as: VITAMIN C Take 1 tablet (500 mg total) by mouth daily.   aspirin 81 MG EC tablet TAKE 1 TABLET (81 MG TOTAL) BY MOUTH 2 (TWO) TIMES DAILY.   calcium citrate-vitamin D 315-200 MG-UNIT tablet Commonly known as: CITRACAL+D Take 2 tablets by mouth 2 (two) times daily with a meal.   chlorpheniramine-HYDROcodone 10-8 MG/5ML Suer Commonly known as: TUSSIONEX Take 5 mLs by mouth every 12 (twelve) hours as needed for cough.   MANNXTRA PO Take 1 Dose by mouth daily. As directed.   montelukast 10 MG tablet Commonly known as: SINGULAIR Take 1 tablet (10 mg total) by mouth at bedtime. TAKE ONE TABLET ONCE DAILY AS DIRECTED   multivitamin capsule Take 1 capsule by mouth daily.   predniSONE 20 MG tablet Commonly known as: DELTASONE 2 tabs po daily for seven days What changed:   medication strength  how much to take  how to take this  when to take this  additional instructions   zinc sulfate 220 (50 Zn) MG capsule Take 1 capsule (220 mg total) by mouth daily.  DISCHARGE INSTRUCTIONS:   Follow-up PMD 2 weeks Follow-up outpatient remdesivir tomorrow and Monday  If you experience worsening of your admission symptoms, develop shortness of breath, life threatening emergency, suicidal or homicidal thoughts you must seek medical attention immediately by calling 911 or calling your MD immediately  if symptoms less severe.  You Must read complete instructions/literature along with all the possible adverse reactions/side effects for all the Medicines you take and that have been prescribed to you. Take any new  Medicines after you have completely understood and accept all the possible adverse reactions/side effects.   Please note  You were cared for by a hospitalist during your hospital stay. If you have any questions about your discharge medications or the care you received while you were in the hospital after you are discharged, you can call the unit and asked to speak with the hospitalist on call if the hospitalist that took care of you is not available. Once you are discharged, your primary care physician will handle any further medical issues. Please note that NO REFILLS for any discharge medications will be authorized once you are discharged, as it is imperative that you return to your primary care physician (or establish a relationship with a primary care physician if you do not have one) for your aftercare needs so that they can reassess your need for medications and monitor your lab values.    Today   CHIEF COMPLAINT:   Chief Complaint  Patient presents with  . Weakness    HISTORY OF PRESENT ILLNESS:  Brooke Cantrell  is a 74 y.o. female came in with weakness found to be Covid positive and hypoxic   VITAL SIGNS:  Blood pressure (!) 145/83, pulse 79, temperature 98.1 F (36.7 C), temperature source Oral, resp. rate 14, height 5\' 4"  (1.626 m), weight 69.9 kg, SpO2 93 %.   PHYSICAL EXAMINATION:  GENERAL:  74 y.o.-year-old patient lying in the bed with no acute distress.  EYES: Pupils equal, round, reactive to light and accommodation. No scleral icterus. Extraocular muscles intact.  HEENT: Head atraumatic, normocephalic. Oropharynx and nasopharynx clear. .  LUNGS: Decreased breath sounds bilaterally, no wheezing, rales,rhonchi or crepitation. No use of accessory muscles of respiration.  CARDIOVASCULAR: S1, S2 normal. No murmurs, rubs, or gallops.  ABDOMEN: Soft, non-tender, non-distended. Bowel sounds present. No organomegaly or mass.  EXTREMITIES: No pedal edema.  NEUROLOGIC: Cranial  nerves II through XII are intact. Muscle strength 5/5 in all extremities. Sensation intact. Gait not checked.  PSYCHIATRIC: The patient is alert and oriented x 3.  SKIN: No obvious rash, lesion, or ulcer.   DATA REVIEW:   CBC Recent Labs  Lab 06/12/20 0648  WBC 17.1*  HGB 13.0  HCT 37.2  PLT 339    Chemistries  Recent Labs  Lab 06/12/20 0648  NA 134*  K 4.1  CL 102  CO2 23  GLUCOSE 169*  BUN 20  CREATININE 0.66  CALCIUM 8.7*  AST 17  ALT 18  ALKPHOS 36*  BILITOT 0.6     Microbiology Results  Results for orders placed or performed during the hospital encounter of 04/02/19  Culture, blood (Routine x 2)     Status: None   Collection Time: 04/02/19  4:12 PM   Specimen: BLOOD  Result Value Ref Range Status   Specimen Description BLOOD RIGHT ANTECUBITAL  Final   Special Requests   Final    BOTTLES DRAWN AEROBIC AND ANAEROBIC Blood Culture adequate volume   Culture   Final  NO GROWTH 5 DAYS Performed at [facility], [address] [place], [place] [address]    Report Status 04/07/2019 FINAL  Final  Culture, blood (Routine x 2)     Status: None   Collection Time: 04/02/19  4:12 PM   Specimen: BLOOD  Result Value Ref Range Status   Specimen Description BLOOD BLOOD RIGHT HAND  Final   Special Requests   Final    BOTTLES DRAWN AEROBIC AND ANAEROBIC Blood Culture adequate volume   Culture   Final    NO GROWTH 5 DAYS Performed at [facility], [address], [place], [place] [address]    Report Status 04/07/2019 FINAL  Final  SARS Coronavirus 2 (CEPHEID- Performed in [facility] [place] hospital lab), Hosp Order     Status: None   Collection Time: 04/02/19  7:08 PM   Specimen: Nasopharyngeal Swab  Result Value Ref Range Status   SARS Coronavirus 2 NEGATIVE NEGATIVE Final    Comment: (NOTE) If result is NEGATIVE SARS-CoV-2 target nucleic acids are NOT DETECTED. The SARS-CoV-2 RNA is generally detectable in upper and lower  respiratory specimens  during the acute phase of infection. The lowest  concentration of SARS-CoV-2 viral copies this assay can detect is 250  copies / mL. A negative result does not preclude SARS-CoV-2 infection  and should not be used as the sole basis for treatment or other  patient management decisions.  A negative result may occur with  improper specimen collection / handling, submission of specimen other  than nasopharyngeal swab, presence of viral mutation(s) within the  areas targeted by this assay, and inadequate number of viral copies  (<250 copies / mL). A negative result must be combined with clinical  observations, patient history, and epidemiological information. If result is POSITIVE SARS-CoV-2 target nucleic acids are DETECTED. The SARS-CoV-2 RNA is generally detectable in upper and lower  respiratory specimens dur ing the acute phase of infection.  Positive  results are indicative of active infection with SARS-CoV-2.  Clinical  correlation with patient history and other diagnostic information is  necessary to determine patient infection status.  Positive results do  not rule out bacterial infection or co-infection with other viruses. If result is PRESUMPTIVE POSTIVE SARS-CoV-2 nucleic acids MAY BE PRESENT.   A presumptive positive result was obtained on the submitted specimen  and confirmed on repeat testing.  While 2019 novel coronavirus  (SARS-CoV-2) nucleic acids may be present in the submitted sample  additional confirmatory testing may be necessary for epidemiological  and / or clinical management purposes  to differentiate between  SARS-CoV-2 and other Sarbecovirus currently known to infect humans.  If clinically indicated additional testing with an alternate test  methodology [phone]) is advised. The SARS-CoV-2 RNA is generally  detectable in upper and lower respiratory sp ecimens during the acute  phase of infection. The expected result is Negative. Fact Sheet for Patients:   StrictlyIdeas.no Fact Sheet for Healthcare Providers: BankingDealers.co.za This test is not yet approved or cleared by the [place] FDA and has been authorized for detection and/or diagnosis of SARS-CoV-2 by FDA under an Emergency Use Authorization (EUA).  This EUA will remain in effect (meaning this test can be used) for the duration of the COVID-19 declaration under Section 564(b)(1) of the Act, 21 U.S.C. section 360bbb-3(b)(1), unless the authorization is terminated or revoked sooner. Performed at [facility], [address], [place], [place] [address]      Management plans discussed with the patient, family  and they are in agreement.  CODE STATUS:     Code Status Orders  (From admission, onward)         Start     Ordered   06/10/20 0336  Full code  Continuous        06/10/20 0338        Code Status History    Date Active Date Inactive Code Status Order ID Comments User Context   04/03/2019 0009 04/03/2019 1938 Full Code [id number]  Lance Coon, MD Inpatient   04/25/2018 1416 04/26/2018 2013 Full Code [id number]  Meredith Pel, MD Inpatient   Advance Care Planning Activity      TOTAL TIME TAKING CARE OF THIS PATIENT: 35 minutes.    Loletha Grayer M.D on 06/12/2020 at 4:29 PM  Between 7am to 6pm - Pager - [phone]  After 6pm go to [email or web address] - password [email or web address]  [facility]  CC: Primary care physician; Dion Body, MD

## 2020-06-12 NOTE — Progress Notes (Signed)
Received MD order to discharge patient to home, reviewed discharge instructions, prescriptions,  follow up appointments, and home  meds with patient and patient verbalized understanding.

## 2020-06-12 NOTE — Progress Notes (Signed)
Patient scheduled for outpatient Remdesivir infusions at 1pm on Sunday 10/3 and Monday 10/4 at [facility]. Please inform the patient to park at [place], as staff will be escorting the patient through the [place] entrance of the hospital. Appointments take approximately 45 minutes.    There is a wave flag banner located near the entrance on [address]. Turn into this entrance and immediately turn left and park in 1 of the 5 designated Covid Infusion Parking spots. There is a phone number on the sign, please call and let the staff know what spot you are in and we will come out and get you. For questions call [phone].  Thanks.

## 2020-06-12 NOTE — Progress Notes (Signed)
PT Cancellation Note  Patient Details Name: Brooke Cantrell MRN: [id number] DOB: [birth year]   Cancelled Treatment:    Reason Eval/Treat Not Completed:  (Consult received and chart reviewed. Patient now discharged from facility.)   Otilio Groleau H. Owens Shark, PT, DPT, NCS 06/12/20, 3:38 PM [phone]

## 2020-06-12 NOTE — Discharge Instructions (Signed)
Patient scheduled for outpatient Remdesivir infusions at 1pm on Sunday 10/3 and Monday 10/4 at [facility]. Please inform the patient to park at [place], as staff will be escorting the patient through the [place] entrance of the hospital. Appointments take approximately 45 minutes.    There is a wave flag banner located near the entrance on [address]. Turn into this entrance and immediately turn left and park in 1 of the 5 designated Covid Infusion Parking spots. There is a phone number on the sign, please call and let the staff know what spot you are in and we will come out and get you. For questions call [phone].  Thanks.    10 Things You Can Do to Manage Your COVID-19 Symptoms at Home If you have possible or confirmed COVID-19: 1. Stay home from work and school. And stay away from other public places. If you must go out, avoid using any kind of public transportation, ridesharing, or taxis. 2. Monitor your symptoms carefully. If your symptoms get worse, call your healthcare provider immediately. 3. Get rest and stay hydrated. 4. If you have a medical appointment, call the healthcare provider ahead of time and tell them that you have or may have COVID-19. 5. For medical emergencies, call 911 and notify the dispatch personnel that you have or may have COVID-19. 6. Cover your cough and sneezes with a tissue or use the inside of your elbow. 7. Wash your hands often with soap and water for at least 20 seconds or clean your hands with an alcohol-based hand sanitizer that contains at least 60% alcohol. 8. As much as possible, stay in a specific room and away from other people in your home. Also, you should use a separate bathroom, if available. If you need to be around other people in or outside of the home, wear a mask. 9. Avoid sharing personal items with other people in your household, like dishes, towels, and bedding. 10. Clean all surfaces that are touched often, like  counters, tabletops, and doorknobs. Use household cleaning sprays or wipes according to the label instructions. [email or web address] 03/12/2019 This information is not intended to replace advice given to you by your health care provider. Make sure you discuss any questions you have with your health care provider. Document Revised: 08/14/2019 Document Reviewed: 08/14/2019 Elsevier Patient Education  [place].   COVID-19 Frequently Asked Questions COVID-19 (coronavirus disease) is an infection that is caused by a large family of viruses. Some viruses cause illness in people and others cause illness in animals like camels, cats, and bats. In some cases, the viruses that cause illness in animals can spread to humans. Where did the coronavirus come from? In December 2019, [place] told the [facility]) of several cases of lung disease (human respiratory illness). These cases were linked to an open seafood and livestock market in the city of [place]. The link to the seafood and livestock market suggests that the virus may have spread from animals to humans. However, since that first outbreak in December, the virus has also been shown to spread from person to person. What is the name of the disease and the virus? Disease name Early on, this disease was called novel coronavirus. This is because scientists determined that the disease was caused by a new (novel) respiratory virus. The World Health Organization [facility]) has now named the disease COVID-19, or coronavirus disease. Virus name The virus that causes the disease is called severe  acute respiratory syndrome coronavirus 2 (SARS-CoV-2). More information on disease and virus naming World Health Organization [facility]): www.who.int/emergencies/diseases/novel-coronavirus-2019/technical-guidance/naming-the-coronavirus-disease-(covid-2019)-and-the-virus-that-causes-it Who is at risk for complications from coronavirus disease? Some people  may be at higher risk for complications from coronavirus disease. This includes older adults and people who have chronic diseases, such as heart disease, diabetes, and lung disease. If you are at higher risk for complications, take these extra precautions:  Stay home as much as possible.  Avoid social gatherings and travel.  Avoid close contact with others. Stay at least 6 ft (2 m) away from others, if possible.  Wash your hands often with soap and water for at least 20 seconds.  Avoid touching your face, mouth, nose, or eyes.  Keep supplies on hand at home, such as food, medicine, and cleaning supplies.  If you must go out in public, wear a cloth face covering or face mask. Make sure your mask covers your nose and mouth. How does coronavirus disease spread? The virus that causes coronavirus disease spreads easily from person to person (is contagious). You may catch the virus by:  Breathing in droplets from an infected person. Droplets can be spread by a person breathing, speaking, singing, coughing, or sneezing.  Touching something, like a table or a doorknob, that was exposed to the virus (contaminated) and then touching your mouth, nose, or eyes. Can I get the virus from touching surfaces or objects? There is still a lot that we do not know about the virus that causes coronavirus disease. Scientists are basing a lot of information on what they know about similar viruses, such as:  Viruses cannot generally survive on surfaces for long. They need a human body (host) to survive.  It is more likely that the virus is spread by close contact with people who are sick (direct contact), such as through: ? Shaking hands or hugging. ? Breathing in respiratory droplets that travel through the air. Droplets can be spread by a person breathing, speaking, singing, coughing, or sneezing.  It is less likely that the virus is spread when a person touches a surface or object that has the virus on it  (indirect contact). The virus may be able to enter the body if the person touches a surface or object and then touches his or her face, eyes, nose, or mouth. Can a person spread the virus without having symptoms of the disease? It may be possible for the virus to spread before a person has symptoms of the disease, but this is most likely not the main way the virus is spreading. It is more likely for the virus to spread by being in close contact with people who are sick and breathing in the respiratory droplets spread by a person breathing, speaking, singing, coughing, or sneezing. What are the symptoms of coronavirus disease? Symptoms vary from person to person and can range from mild to severe. Symptoms may include:  Fever or chills.  Cough.  Difficulty breathing or feeling short of breath.  Headaches, body aches, or muscle aches.  Runny or stuffy (congested) nose.  Sore throat.  New loss of taste or smell.  Nausea, vomiting, or diarrhea. These symptoms can appear anywhere from 2 to 14 days after you have been exposed to the virus. Some people may not have any symptoms. If you develop symptoms, call your health care provider. People with severe symptoms may need hospital care. Should I be tested for this virus? Your health care provider will decide whether to test you  based on your symptoms, history of exposure, and your risk factors. How does a health care provider test for this virus? Health care providers will collect samples to send for testing. Samples may include:  Taking a swab of fluid from the back of your nose and throat, your nose, or your throat.  Taking fluid from the lungs by having you cough up mucus (sputum) into a sterile cup.  Taking a blood sample. Is there a treatment or vaccine for this virus? Currently, there is no vaccine to prevent coronavirus disease. Also, there are no medicines like antibiotics or antivirals to treat the virus. A person who becomes sick is  given supportive care, which means rest and fluids. A person may also relieve his or her symptoms by using over-the-counter medicines that treat sneezing, coughing, and runny nose. These are the same medicines that a person takes for the common cold. If you develop symptoms, call your health care provider. People with severe symptoms may need hospital care. What can I do to protect myself and my family from this virus?     You can protect yourself and your family by taking the same actions that you would take to prevent the spread of other viruses. Take the following actions:  Wash your hands often with soap and water for at least 20 seconds. If soap and water are not available, use alcohol-based hand sanitizer.  Avoid touching your face, mouth, nose, or eyes.  Cough or sneeze into a tissue, sleeve, or elbow. Do not cough or sneeze into your hand or the air. ? If you cough or sneeze into a tissue, throw it away immediately and wash your hands.  Disinfect objects and surfaces that you frequently touch every day.  Stay away from people who are sick.  Avoid going out in public, follow guidance from your state and local health authorities.  Avoid crowded indoor spaces. Stay at least 6 ft (2 m) away from others.  If you must go out in public, wear a cloth face covering or face mask. Make sure your mask covers your nose and mouth.  Stay home if you are sick, except to get medical care. Call your health care provider before you get medical care. Your health care provider will tell you how long to stay home.  Make sure your vaccines are up to date. Ask your health care provider what vaccines you need. What should I do if I need to travel? Follow travel recommendations from your local health authority, the CDC, and WHO. Travel information and advice  Centers for Disease Control and Prevention (CDC): BodyEditor.hu  World Health Organization [facility]):  ThirdIncome.ca Know the risks and take action to protect your health  You are at higher risk of getting coronavirus disease if you are traveling to areas with an outbreak or if you are exposed to travelers from areas with an outbreak.  Wash your hands often and practice good hygiene to lower the risk of catching or spreading the virus. What should I do if I am sick? General instructions to stop the spread of infection  Wash your hands often with soap and water for at least 20 seconds. If soap and water are not available, use alcohol-based hand sanitizer.  Cough or sneeze into a tissue, sleeve, or elbow. Do not cough or sneeze into your hand or the air.  If you cough or sneeze into a tissue, throw it away immediately and wash your hands.  Stay home unless you must get  medical care. Call your health care provider or local health authority before you get medical care.  Avoid public areas. Do not take public transportation, if possible.  If you can, wear a mask if you must go out of the house or if you are in close contact with someone who is not sick. Make sure your mask covers your nose and mouth. Keep your home clean  Disinfect objects and surfaces that are frequently touched every day. This may include: ? Counters and tables. ? Doorknobs and light switches. ? Sinks and faucets. ? Electronics such as phones, remote controls, keyboards, computers, and tablets.  Wash dishes in hot, soapy water or use a dishwasher. Air-dry your dishes.  Wash laundry in hot water. Prevent infecting other household members  Let healthy household members care for children and pets, if possible. If you have to care for children or pets, wash your hands often and wear a mask.  Sleep in a different bedroom or bed, if possible.  Do not share personal items, such as razors, toothbrushes, deodorant, combs, brushes, towels, and washcloths. Where to find  more information Centers for Disease Control and Prevention (CDC)  Information and news updates: https://www.butler-gonzalez.com/ World Health Organization [facility])  Information and news updates: MissExecutive.com.ee  Coronavirus health topic: https://www.castaneda.info/  Questions and answers on COVID-19: OpportunityDebt.at  Global tracker: who.sprinklr.com American Academy of Pediatrics (AAP)  Information for families: www.healthychildren.org/English/health-issues/conditions/chest-lungs/Pages/2019-Novel-Coronavirus.aspx The coronavirus situation is changing rapidly. Check your local health authority website or the CDC and [facility] websites for updates and news. When should I contact a health care provider?  Contact your health care provider if you have symptoms of an infection, such as fever or cough, and you: ? Have been near anyone who is known to have coronavirus disease. ? Have come into contact with a person who is suspected to have coronavirus disease. ? Have traveled to an area where there is an outbreak of COVID-19. When should I get emergency medical care?  Get help right away by calling your local emergency services (911 in the [place] if you have: ? Trouble breathing. ? Pain or pressure in your chest. ? Confusion. ? Blue-tinged lips and fingernails. ? Difficulty waking from sleep. ? Symptoms that get worse. Let the emergency medical personnel know if you think you have coronavirus disease. Summary  A new respiratory virus is spreading from person to person and causing COVID-19 (coronavirus disease).  The virus that causes COVID-19 appears to spread easily. It spreads from one person to another through droplets from breathing, speaking, singing, coughing, or sneezing.  Older adults and those with chronic diseases are at higher risk of disease. If you are at higher risk for complications, take extra  precautions.  There is currently no vaccine to prevent coronavirus disease. There are no medicines, such as antibiotics or antivirals, to treat the virus.  You can protect yourself and your family by washing your hands often, avoiding touching your face, and covering your coughs and sneezes. This information is not intended to replace advice given to you by your health care provider. Make sure you discuss any questions you have with your health care provider. Document Revised: 06/27/2019 Document Reviewed: 12/24/2018 Elsevier Patient Education  [place].  COVID-19: How to Protect Yourself and Others Know how it spreads  There is currently no vaccine to prevent coronavirus disease 2019 (COVID-19).  The best way to prevent illness is to avoid being exposed to this virus.  The virus is thought to spread mainly  from person-to-person. ? Between people who are in close contact with one another (within about 6 feet). ? Through respiratory droplets produced when an infected person coughs, sneezes or talks. ? These droplets can land in the mouths or noses of people who are nearby or possibly be inhaled into the lungs. ? COVID-19 may be spread by people who are not showing symptoms. Everyone should Clean your hands often  Wash your hands often with soap and water for at least 20 seconds especially after you have been in a public place, or after blowing your nose, coughing, or sneezing.  If soap and water are not readily available, use a hand sanitizer that contains at least 60% alcohol. Cover all surfaces of your hands and rub them together until they feel dry.  Avoid touching your eyes, nose, and mouth with unwashed hands. Avoid close contact  Limit contact with others as much as possible.  Avoid close contact with people who are sick.  Put distance between yourself and other people. ? Remember that some people without symptoms may be able to spread virus. ? This is especially  important for people who are at higher risk of getting very GainPain.com.cy Cover your mouth and nose with a mask when around others  You could spread COVID-19 to others even if you do not feel sick.  Everyone should wear a mask in public settings and when around people not living in their household, especially when social distancing is difficult to maintain. ? Masks should not be placed on young children under age 38, anyone who has trouble breathing, or is unconscious, incapacitated or otherwise unable to remove the mask without assistance.  The mask is meant to protect other people in case you are infected.  Do NOT use a facemask meant for a Dietitian.  Continue to keep about 6 feet between yourself and others. The mask is not a substitute for social distancing. Cover coughs and sneezes  Always cover your mouth and nose with a tissue when you cough or sneeze or use the inside of your elbow.  Throw used tissues in the trash.  Immediately wash your hands with soap and water for at least 20 seconds. If soap and water are not readily available, clean your hands with a hand sanitizer that contains at least 60% alcohol. Clean and disinfect  Clean AND disinfect frequently touched surfaces daily. This includes tables, doorknobs, light switches, countertops, handles, desks, phones, keyboards, toilets, faucets, and sinks. RackRewards.fr  If surfaces are dirty, clean them: Use detergent or soap and water prior to disinfection.  Then, use a household disinfectant. You can see a list of EPA-registered household disinfectants here. [email or web address].[email or web address] 05/14/2019 This information is not intended to replace advice given to you by your health care provider. Make sure you discuss any questions you have with your health care provider. Document Revised:  05/22/2019 Document Reviewed: 03/20/2019 Elsevier Patient Education  [place].

## 2020-06-13 ENCOUNTER — Ambulatory Visit (HOSPITAL_COMMUNITY)
Admit: 2020-06-13 | Discharge: 2020-06-13 | Disposition: A | Discharge status: Home / Self Care | Payer: Medicare Other | Source: Ambulatory Visit | Attending: Pulmonary Disease | Admitting: Pulmonary Disease

## 2020-06-13 DIAGNOSIS — U071 COVID-19: Secondary | ICD-10-CM | POA: Diagnosis present

## 2020-06-13 DIAGNOSIS — J1282 Pneumonia due to coronavirus disease 2019: Secondary | ICD-10-CM | POA: Insufficient documentation

## 2020-06-13 MED ORDER — METHYLPREDNISOLONE SODIUM SUCC 125 MG IJ SOLR: 125.0000 mg | Freq: Once | INTRAMUSCULAR | Status: DC | PRN

## 2020-06-13 MED ORDER — ALBUTEROL SULFATE HFA 108 (90 BASE) MCG/ACT IN AERS: 2.0000 | INHALATION_SPRAY | Freq: Once | RESPIRATORY_TRACT | Status: DC | PRN

## 2020-06-13 MED ORDER — DIPHENHYDRAMINE HCL 50 MG/ML IJ SOLN: 50.0000 mg | Freq: Once | INTRAMUSCULAR | Status: DC | PRN

## 2020-06-13 MED ORDER — SODIUM CHLORIDE 0.9 % IV SOLN: INTRAVENOUS | Status: DC | PRN

## 2020-06-13 MED ORDER — FAMOTIDINE IN NACL 20-0.9 MG/50ML-% IV SOLN: 20.0000 mg | Freq: Once | INTRAVENOUS | Status: DC | PRN

## 2020-06-13 MED ORDER — EPINEPHRINE 0.3 MG/0.3ML IJ SOAJ: 0.3000 mg | Freq: Once | INTRAMUSCULAR | Status: DC | PRN

## 2020-06-13 MED ORDER — SODIUM CHLORIDE 0.9 % IV SOLN
100.0000 mg | Freq: Once | INTRAVENOUS | Status: AC
  Administered 2020-06-13: 100 mg via INTRAVENOUS
  Filled 2020-06-13: qty 20

## 2020-06-13 NOTE — Progress Notes (Signed)
  Diagnosis: COVID-19  Physician: Dr. Joya Gaskins   Procedure: [facility]fusion [facility] Med: remdesivir infusion - Provided patient with remdesivir fact sheet for patients, parents and caregivers prior to infusion.  Complications: No immediate complications noted.  Discharge: Discharged home   Brooke Cantrell 06/13/2020

## 2020-06-14 ENCOUNTER — Ambulatory Visit (HOSPITAL_COMMUNITY)
Admit: 2020-06-14 | Discharge: 2020-06-14 | Disposition: A | Discharge status: Home / Self Care | Payer: Medicare Other | Attending: Pulmonary Disease | Admitting: Pulmonary Disease

## 2020-06-14 DIAGNOSIS — U071 COVID-19: Secondary | ICD-10-CM | POA: Diagnosis not present

## 2020-06-14 MED ORDER — DIPHENHYDRAMINE HCL 50 MG/ML IJ SOLN: 50.0000 mg | Freq: Once | INTRAMUSCULAR | Status: DC | PRN

## 2020-06-14 MED ORDER — EPINEPHRINE 0.3 MG/0.3ML IJ SOAJ: 0.3000 mg | Freq: Once | INTRAMUSCULAR | Status: DC | PRN

## 2020-06-14 MED ORDER — METHYLPREDNISOLONE SODIUM SUCC 125 MG IJ SOLR: 125.0000 mg | Freq: Once | INTRAMUSCULAR | Status: DC | PRN

## 2020-06-14 MED ORDER — FAMOTIDINE IN NACL 20-0.9 MG/50ML-% IV SOLN: 20.0000 mg | Freq: Once | INTRAVENOUS | Status: DC | PRN

## 2020-06-14 MED ORDER — ALBUTEROL SULFATE HFA 108 (90 BASE) MCG/ACT IN AERS: 2.0000 | INHALATION_SPRAY | Freq: Once | RESPIRATORY_TRACT | Status: DC | PRN

## 2020-06-14 MED ORDER — SODIUM CHLORIDE 0.9 % IV SOLN
100.0000 mg | Freq: Once | INTRAVENOUS | Status: AC
  Administered 2020-06-14: 100 mg via INTRAVENOUS

## 2020-06-14 MED ORDER — SODIUM CHLORIDE 0.9 % IV SOLN: INTRAVENOUS | Status: DC | PRN

## 2020-06-14 NOTE — Progress Notes (Addendum)
  Diagnosis: COVID-19  Physician: Dr. Joya Gaskins  Procedure: [facility] Infusion Clinic Med: remdesivir infusion - Provided patient with remdesivir fact sheet for patients, parents and caregivers prior to infusion.  Complications: No immediate complications noted.  Discharge: Discharged home   Brooke Cantrell 06/14/2020

## 2020-07-01 ENCOUNTER — Other Ambulatory Visit: Payer: Self-pay | Admitting: Family Medicine

## 2020-07-01 DIAGNOSIS — Z1231 Encounter for screening mammogram for malignant neoplasm of breast: Secondary | ICD-10-CM

## 2020-07-03 ENCOUNTER — Other Ambulatory Visit: Payer: Self-pay

## 2020-07-29 ENCOUNTER — Ambulatory Visit: Payer: Medicare Other

## 2020-07-29 ENCOUNTER — Ambulatory Visit
Admission: RE | Admit: 2020-07-29 | Discharge: 2020-07-29 | Disposition: A | Discharge status: Home / Self Care | Payer: Medicare Other | Source: Ambulatory Visit | Attending: Family Medicine | Admitting: Family Medicine

## 2020-07-29 ENCOUNTER — Other Ambulatory Visit: Payer: Self-pay

## 2020-07-29 DIAGNOSIS — Z1231 Encounter for screening mammogram for malignant neoplasm of breast: Secondary | ICD-10-CM

## 2021-03-28 ENCOUNTER — Other Ambulatory Visit: Payer: Self-pay | Admitting: Pulmonary Disease

## 2021-03-28 DIAGNOSIS — R0609 Other forms of dyspnea: Secondary | ICD-10-CM

## 2021-03-28 DIAGNOSIS — R911 Solitary pulmonary nodule: Secondary | ICD-10-CM

## 2021-04-21 ENCOUNTER — Other Ambulatory Visit: Payer: Self-pay | Admitting: Pulmonary Disease

## 2021-04-21 DIAGNOSIS — R0609 Other forms of dyspnea: Secondary | ICD-10-CM

## 2021-04-21 DIAGNOSIS — R911 Solitary pulmonary nodule: Secondary | ICD-10-CM

## 2021-04-21 DIAGNOSIS — U099 Post covid-19 condition, unspecified: Secondary | ICD-10-CM

## 2021-05-06 ENCOUNTER — Other Ambulatory Visit: Payer: Self-pay

## 2021-05-06 ENCOUNTER — Ambulatory Visit
Admission: RE | Admit: 2021-05-06 | Discharge: 2021-05-06 | Disposition: A | Discharge status: Home / Self Care | Payer: Medicare Other | Source: Ambulatory Visit | Attending: Pulmonary Disease | Admitting: Pulmonary Disease

## 2021-05-06 DIAGNOSIS — R0609 Other forms of dyspnea: Secondary | ICD-10-CM | POA: Insufficient documentation

## 2021-05-06 DIAGNOSIS — R911 Solitary pulmonary nodule: Secondary | ICD-10-CM | POA: Insufficient documentation

## 2021-05-06 DIAGNOSIS — U099 Post covid-19 condition, unspecified: Secondary | ICD-10-CM | POA: Diagnosis present

## 2021-05-06 LAB — POCT I-STAT CREATININE: Creatinine, Ser: 0.8 mg/dL (ref 0.44–1.00)

## 2021-05-06 MED ORDER — IOHEXOL 350 MG/ML SOLN
75.0000 mL | Freq: Once | INTRAVENOUS | Status: AC | PRN
  Administered 2021-05-06: 75 mL via INTRAVENOUS

## 2021-10-06 ENCOUNTER — Telehealth: Payer: Self-pay

## 2021-10-06 NOTE — Telephone Encounter (Signed)
Patient is in [place] and wanted to know if Dr. Ninfa Linden knew of any orthopedic that they could go to. She hurt her knee and will be in [place] for the next 3 months or else they would make an apt here

## 2021-10-06 NOTE — Telephone Encounter (Signed)
Called and advised. Pt stated understanding  °

## 2021-10-06 NOTE — Telephone Encounter (Signed)
Please advise 

## 2021-12-07 ENCOUNTER — Encounter (HOSPITAL_COMMUNITY): Payer: Self-pay | Admitting: Radiology

## 2022-03-15 ENCOUNTER — Other Ambulatory Visit: Payer: Self-pay | Admitting: Family Medicine

## 2022-03-15 DIAGNOSIS — Z1231 Encounter for screening mammogram for malignant neoplasm of breast: Secondary | ICD-10-CM

## 2022-04-06 ENCOUNTER — Ambulatory Visit
Admission: RE | Admit: 2022-04-06 | Discharge: 2022-04-06 | Disposition: A | Discharge status: Home / Self Care | Payer: Medicare Other | Source: Ambulatory Visit | Attending: Family Medicine | Admitting: Family Medicine

## 2022-04-06 DIAGNOSIS — Z1231 Encounter for screening mammogram for malignant neoplasm of breast: Secondary | ICD-10-CM

## 2022-06-02 ENCOUNTER — Other Ambulatory Visit: Payer: Self-pay | Admitting: Pulmonary Disease

## 2022-06-02 DIAGNOSIS — R918 Other nonspecific abnormal finding of lung field: Secondary | ICD-10-CM

## 2022-06-09 ENCOUNTER — Ambulatory Visit
Admission: RE | Admit: 2022-06-09 | Discharge: 2022-06-09 | Disposition: A | Discharge status: Home / Self Care | Payer: Medicare Other | Source: Ambulatory Visit | Attending: Pulmonary Disease | Admitting: Pulmonary Disease

## 2022-06-09 DIAGNOSIS — R918 Other nonspecific abnormal finding of lung field: Secondary | ICD-10-CM

## 2022-06-09 MED ORDER — IOPAMIDOL (ISOVUE-300) INJECTION 61%
75.0000 mL | Freq: Once | INTRAVENOUS | Status: AC | PRN
  Administered 2022-06-09: 75 mL via INTRAVENOUS

## 2022-08-14 ENCOUNTER — Telehealth: Payer: Self-pay

## 2022-08-14 NOTE — Telephone Encounter (Signed)
Ok for cp thx

## 2022-08-14 NOTE — Telephone Encounter (Signed)
Patient called wanting to know if she would mess up her left shoulder if she went to a chiropractor for her neck.  Stated that she had left shoulder surgery in 2019.  CB# [phone].  Please advise.  Thank you.

## 2022-08-15 NOTE — Telephone Encounter (Signed)
I called patient and advised. 

## 2023-01-01 ENCOUNTER — Ambulatory Visit: Payer: Medicare Other | Admitting: Orthopaedic Surgery

## 2023-04-04 ENCOUNTER — Encounter: Payer: Self-pay | Admitting: Orthopaedic Surgery

## 2023-04-04 ENCOUNTER — Ambulatory Visit (INDEPENDENT_AMBULATORY_CARE_PROVIDER_SITE_OTHER): Payer: Medicare Other | Admitting: Orthopaedic Surgery

## 2023-04-04 ENCOUNTER — Other Ambulatory Visit (INDEPENDENT_AMBULATORY_CARE_PROVIDER_SITE_OTHER): Payer: Medicare Other

## 2023-04-04 ENCOUNTER — Other Ambulatory Visit: Payer: Self-pay

## 2023-04-04 DIAGNOSIS — M542 Cervicalgia: Secondary | ICD-10-CM

## 2023-04-04 NOTE — Progress Notes (Signed)
The patient comes in today with significant neck pain and discomfort.  This is been going on since November with no known injury.  She 77 years old.  She has a history of a left reverse shoulder arthroplasty.  She has been holding her head tilted to the left for some time now.  She is seeing a chiropractor and had acupuncture treatment and this has not helped.  She denies any significant radicular symptoms but it does radiate into her head and somewhat in the trapezius area on both sides.  Her pain seems more on the right but she holds her head and position more to the left.  She has limited range of motion of her cervical spine secondary to stiffness and pain.  She does not have any weakness in her upper extremities as far as I can tell on my exam and no numbness and tingling.  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  At this point a MRI of the cervical spine is warranted to rule out nerve compression given the posture she has of her neck but I would also like to send her to formal outpatient physical therapy with any modalities that can hopefully relax the muscles in her neck and help improve the range of motion and decrease her pain.  She agrees with this treatment plan.  Will see her back in 4 weeks but hopefully will have had a course of physical therapy and the MRI performed so we can go from there in terms of other treatment modalities if indicated.

## 2023-04-10 ENCOUNTER — Ambulatory Visit
Admission: RE | Admit: 2023-04-10 | Discharge: 2023-04-10 | Disposition: A | Discharge status: Home / Self Care | Payer: Medicare Other | Source: Ambulatory Visit | Attending: Orthopaedic Surgery | Admitting: Orthopaedic Surgery

## 2023-04-10 DIAGNOSIS — M542 Cervicalgia: Secondary | ICD-10-CM

## 2023-04-17 NOTE — Therapy (Signed)
OUTPATIENT PHYSICAL THERAPY EVALUATION   Patient Name: Brooke Cantrell MRN: [id number] DOB:25-May-1946, 77 y.o., female Today's Date: 04/18/2023  END OF SESSION:  PT End of Session - 04/18/23 1513     Visit Number 1    Number of Visits 20    Date for PT Re-Evaluation 06/27/23    Authorization Type Medicare [name]    PT Start Time 1518    PT Stop Time 1605    PT Time Calculation (min) 47 min    Activity Tolerance Patient tolerated treatment well    Behavior During Therapy WFL for tasks assessed/performed             Past Medical History:  Diagnosis Date   Arthritis    Breast cancer (HCC)    Left breast   Depression    "mild", no meds   History of kidney stones    Past Surgical History:  Procedure Laterality Date   breast cancer surgery     BREAST ENHANCEMENT SURGERY     BREAST LUMPECTOMY Left    CATARACT EXTRACTION, BILATERAL     ECTOPIC PREGNANCY SURGERY     EYE SURGERY     INNER EAR SURGERY     TONSILLECTOMY     TOTAL SHOULDER ARTHROPLASTY Left 04/25/2018   TOTAL SHOULDER ARTHROPLASTY Left 04/25/2018   Procedure: LEFT TOTAL SHOULDER REPLACEMENT;  Surgeon: Cammy Copa, MD;  Location: [facility] OR;  Service: Orthopedics;  Laterality: Left;   TUBAL LIGATION     Patient Active Problem List   Diagnosis Date Noted   Weakness    Hyperlipidemia    Acute respiratory failure due to COVID-19 (HCC) 06/10/2020   Pneumonia due to COVID-19 virus 06/10/2020   Hx of multiple pulmonary nodules 06/10/2020   Acute respiratory failure with hypoxia (HCC)    Lung nodule seen on imaging study 01/08/2020   History of breast cancer 04/14/2019   Sepsis (HCC) 04/02/2019   CAP (community acquired pneumonia) 04/02/2019   Elevated troponin 04/02/2019   Shoulder arthritis 04/25/2018   Primary osteoarthritis, 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   Chronic left shoulder pain 05/17/2017   Cervical disc disorder with radiculopathy 08/14/2016   Chronic infection of sinus 08/31/2015   Cough 08/31/2015    Allergic reaction 08/31/2015    PCP: Marisue Ivan , MD  REFERRING PROVIDER: Kathryne Hitch, MD  REFERRING DIAG: M54.2 (ICD-10-CM) - Cervicalgia  THERAPY DIAG:  Cervicalgia  Abnormal posture  Rationale for Evaluation and Treatment: Rehabilitation  ONSET DATE: Nov 2023  SUBJECTIVE:  SUBJECTIVE STATEMENT: Insidious onset of symptoms of stiff neck with pain in Nov 2023.  She indicated seeing chiro with adjustments that didn't seem to do something.   Reported complaints with head movements.  Complaints mainly on Rt side.  No numbness/tingling.  Pt indicated no vision changes.   MRI on 04/10/2023   PERTINENT HISTORY:  History of breast cancer, kidney stones, arthritis, Lt TSA  PAIN:  NPRS scale: at worst in last few weeks 6/10 moderate.   Pain location: cervical Rt Pain description: stiffness/tightness, sharp Aggravating factors: waking up stuff, using hand tools in gardening, bending forward, head turns limited in driving Relieving factors: aleve   PRECAUTIONS: None  WEIGHT BEARING RESTRICTIONS: No  FALLS:  Has patient fallen in last 6 months? No  LIVING ENVIRONMENT: Lives in: House/apartment  PLOF: Independent, gardening, Rt hand dominant   PATIENT GOALS: Reduce pain, get back to activity  OBJECTIVE:   PATIENT SURVEYS:  04/18/2023 FOTO intake:  64  predicted:  67  COGNITION: 04/18/2023 Overall cognitive status: Within functional limits for tasks assessed  SENSATION: 04/18/2023 [facility]  POSTURE:  04/18/2023 Head positioning to Lt into lateral flexion, rounded shoulders.   PALPATION: 04/18/2023 Tenderness, trigger points in Rt upper trap, cervical paraspinals, Rt infraspinatus.  CERVICAL ROM:   ROM AROM (deg) 04/18/2023  Flexion 40 c Lt  pain    Extension 42 c Rt pain  Right lateral flexion   Left lateral flexion   Right rotation 30   Left rotation 38   (Blank rows = not tested)  UPPER EXTREMITY ROM:   ROM Right 04/18/2023 Left 04/18/2023  Shoulder flexion    Shoulder extension    Shoulder abduction    Shoulder adduction    Shoulder extension    Shoulder internal rotation    Shoulder external rotation    Elbow flexion    Elbow extension    Wrist flexion    Wrist extension    Wrist ulnar deviation    Wrist radial deviation    Wrist pronation    Wrist supination     (Blank rows = not tested)  UPPER EXTREMITY MMT:  MMT Right 04/18/2023 Left 04/18/2023  Shoulder flexion 5/5 5/5  Shoulder extension    Shoulder abduction 5/5 5/5  Shoulder adduction    Shoulder extension    Shoulder internal rotation 5/5 4/5  Shoulder external rotation 5/5 5/5  Middle trapezius    Lower trapezius    Elbow flexion 5/5 5/5  Elbow extension 5/5 5/5  Wrist flexion    Wrist extension    Wrist ulnar deviation    Wrist radial deviation    Wrist pronation    Wrist supination    Grip strength     (Blank rows = not tested)  CERVICAL SPECIAL TESTS:  04/18/2023 Cervical translatory glides limited mild to moderate throughout cervical region.   FUNCTIONAL TESTS:  04/18/2023 No testing  TODAY'S TREATMENT:                                                                                                       DATE:  04/18/2023 Therex:    HEP instruction/performance c cues for techniques, handout provided.  Trial set performed of each for comprehension and symptom assessment.  See below for exercise list  Manual: Supine cervical downslope C4-C7 g2-g3 for mobilty gains, performed bilaterally   PATIENT EDUCATION:  Education details: HEP, POC Person educated:  Patient Education method: Explanation, Demonstration, Verbal cues, and Handouts Education comprehension: verbalized understanding, returned demonstration, and verbal cues required  HOME EXERCISE PROGRAM: Access Code: [email or web address] URL: [email or web address] Date: 04/18/2023 Prepared by: Chyrel Masson  Exercises - Supine Cervical Retraction with Towel  - 1-2 x daily - 7 x weekly - 1 sets - 10 reps - 5 hold - Cervical Retraction at Wall  - 2 x daily - 7 x weekly - 1 sets - 10 reps - 5 hold - Supine Cervical Rotation AROM on Pillow  - 1-2 x daily - 7 x weekly - 3 sets - 10 reps - Supine Scapular Retraction  - 1-2 x daily - 7 x weekly - 1 sets - 10 reps - 5 hold - Seated Scapular Retraction  - 1-2 x daily - 7 x weekly - 1 sets - 10 reps - 3-5 hold - Seated Assisted Cervical Rotation with Towel  - 2-3 x daily - 7 x weekly - 1 sets - 10 reps - 2-3 hold  ASSESSMENT:  CLINICAL IMPRESSION: Patient is a 77 y.o. who comes to clinic with complaints of cervical pain with mobility deficits primary that impair their ability to perform usual daily and recreational functional activities without increase difficulty/symptoms at this time.  Patient to benefit from skilled PT services to address impairments and limitations to improve to previous level of function without restriction secondary to condition.    OBJECTIVE IMPAIRMENTS: decreased activity tolerance, decreased endurance, decreased mobility, decreased ROM, decreased strength, hypomobility, increased fascial restrictions, impaired perceived functional ability, increased muscle spasms, impaired flexibility, impaired UE functional use, improper body mechanics, postural dysfunction, and pain.   ACTIVITY LIMITATIONS: carrying, lifting, bending, sitting, sleeping, dressing, reach over head, and hygiene/grooming  PARTICIPATION LIMITATIONS: cleaning, laundry, driving, community activity, and yard work  PERSONAL FACTORS: Time since onset of  injury/illness/exacerbation and History of breast cancer, kidney stones, arthritis, Lt TSA  are also affecting patient's functional outcome.   REHAB POTENTIAL: Good  CLINICAL DECISION MAKING: Stable/uncomplicated  EVALUATION COMPLEXITY: Low   GOALS: Goals reviewed with patient? Yes  SHORT TERM GOALS: (target date for Short term goals are 3 weeks 05/09/2023)  1.Patient will demonstrate independent use of home exercise program to maintain progress from in clinic treatments. Goal status: New  LONG TERM GOALS: (target dates for all long term goals are 10 weeks  06/27/2023 )   1. Patient will demonstrate/report pain at worst less than or equal to 2/10 to facilitate minimal limitation in daily activity secondary to pain symptoms. Goal status: New   2. Patient will demonstrate  independent use of home exercise program to facilitate ability to maintain/progress functional gains from skilled physical therapy services. Goal status: New   3. Patient will demonstrate FOTO outcome > or = 67 % to indicate reduced disability due to condition. Goal status: New   4.  Patient will demonstrate cervical AROM WFL s symptoms to facilitate usual head movements for daily activity including driving, self care.   Goal status: New   5.  Patient will demonstrate/report ability to sleep s restriction due to symptoms.   Goal status: New   6.  Patient will demonstrate/report ability to perform yard/garden activity at [facility].  Goal status: New     PLAN:  PT FREQUENCY: 1-2x/week  PT DURATION: 10 weeks  PLANNED INTERVENTIONS: Therapeutic exercises, Therapeutic activity, Neuro Muscular re-education, Balance training, Gait training, Patient/Family education, Joint mobilization, Stair training, DME instructions, Dry Needling, Electrical stimulation, Cryotherapy, vasopneumatic device,Traction, Moist heat, Taping, Ultrasound, Ionotophoresis 4mg /ml Dexamethasone, and aquatic therapy, Manual therapy.  All included  unless contraindicated  PLAN FOR NEXT SESSION: Check HEP use/response.  Manual intervention for joint mobility gains.  Possible dry needling.   Chyrel Masson, PT, DPT, OCS, ATC 04/18/23  4:14 PM

## 2023-04-18 ENCOUNTER — Other Ambulatory Visit: Payer: Self-pay

## 2023-04-18 ENCOUNTER — Ambulatory Visit (INDEPENDENT_AMBULATORY_CARE_PROVIDER_SITE_OTHER): Payer: Medicare Other | Admitting: Rehabilitative and Restorative Service Providers"

## 2023-04-18 ENCOUNTER — Encounter: Payer: Self-pay | Admitting: Rehabilitative and Restorative Service Providers"

## 2023-04-18 DIAGNOSIS — R293 Abnormal posture: Secondary | ICD-10-CM

## 2023-04-18 DIAGNOSIS — M542 Cervicalgia: Secondary | ICD-10-CM | POA: Diagnosis not present

## 2023-04-25 ENCOUNTER — Ambulatory Visit (INDEPENDENT_AMBULATORY_CARE_PROVIDER_SITE_OTHER): Payer: Medicare Other | Admitting: Rehabilitative and Restorative Service Providers"

## 2023-04-25 ENCOUNTER — Encounter: Payer: Self-pay | Admitting: Rehabilitative and Restorative Service Providers"

## 2023-04-25 DIAGNOSIS — R293 Abnormal posture: Secondary | ICD-10-CM | POA: Diagnosis not present

## 2023-04-25 DIAGNOSIS — M542 Cervicalgia: Secondary | ICD-10-CM

## 2023-04-25 NOTE — Therapy (Signed)
OUTPATIENT PHYSICAL THERAPY TREATMENT   Patient Name: Brooke Cantrell MRN: [id number] DOB:1946/04/06, 77 y.o., female Today's Date: 04/25/2023  END OF SESSION:  PT End of Session - 04/25/23 1131     Visit Number 2    Number of Visits 20    Date for PT Re-Evaluation 06/27/23    Authorization Type Medicare [name]    PT Start Time 1131    PT Stop Time 1211    PT Time Calculation (min) 40 min    Activity Tolerance Patient tolerated treatment well    Behavior During Therapy WFL for tasks assessed/performed              Past Medical History:  Diagnosis Date   Arthritis    Breast cancer (HCC)    Left breast   Depression    "mild", no meds   History of kidney stones    Past Surgical History:  Procedure Laterality Date   breast cancer surgery     BREAST ENHANCEMENT SURGERY     BREAST LUMPECTOMY Left    CATARACT EXTRACTION, BILATERAL     ECTOPIC PREGNANCY SURGERY     EYE SURGERY     INNER EAR SURGERY     TONSILLECTOMY     TOTAL SHOULDER ARTHROPLASTY Left 04/25/2018   TOTAL SHOULDER ARTHROPLASTY Left 04/25/2018   Procedure: LEFT TOTAL SHOULDER REPLACEMENT;  Surgeon: Cammy Copa, MD;  Location: [facility] OR;  Service: Orthopedics;  Laterality: Left;   TUBAL LIGATION     Patient Active Problem List   Diagnosis Date Noted   Weakness    Hyperlipidemia    Acute respiratory failure due to COVID-19 (HCC) 06/10/2020   Pneumonia due to COVID-19 virus 06/10/2020   Hx of multiple pulmonary nodules 06/10/2020   Acute respiratory failure with hypoxia (HCC)    Lung nodule seen on imaging study 01/08/2020   History of breast cancer 04/14/2019   Sepsis (HCC) 04/02/2019   CAP (community acquired pneumonia) 04/02/2019   Elevated troponin 04/02/2019   Shoulder arthritis 04/25/2018   Primary osteoarthritis, 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   Chronic left shoulder pain 05/17/2017   Cervical disc disorder with radiculopathy 08/14/2016   Chronic infection of sinus 08/31/2015   Cough 08/31/2015    Allergic reaction 08/31/2015    PCP: Marisue Ivan , MD  REFERRING PROVIDER: Kathryne Hitch, MD  REFERRING DIAG: M54.2 (ICD-10-CM) - Cervicalgia  THERAPY DIAG:  Cervicalgia  Abnormal posture  Rationale for Evaluation and Treatment: Rehabilitation  ONSET DATE: Nov 2023  SUBJECTIVE:  SUBJECTIVE STATEMENT: Pt indicated having some improvement with less medicine required.  Reported having some trouble sleeping last night.    PERTINENT HISTORY:  History of breast cancer, kidney stones, arthritis, Lt TSA  PAIN:  NPRS scale: current 2-3/10 Pain location: cervical Rt Pain description: stiffness/tightness, sharp Aggravating factors: waking up stuff, using hand tools in gardening, bending forward, head turns limited in driving Relieving factors: aleve   PRECAUTIONS: None  WEIGHT BEARING RESTRICTIONS: No  FALLS:  Has patient fallen in last 6 months? No  LIVING ENVIRONMENT: Lives in: House/apartment  PLOF: Independent, gardening, Rt hand dominant   PATIENT GOALS: Reduce pain, get back to activity  OBJECTIVE:   PATIENT SURVEYS:  04/18/2023 FOTO intake:  64  predicted:  67  COGNITION: 04/18/2023 Overall cognitive status: Within functional limits for tasks assessed  SENSATION: 04/18/2023 [facility]  POSTURE:  04/18/2023 Head positioning to Lt into lateral flexion, rounded shoulders.   PALPATION: 04/18/2023 Tenderness, trigger points in Rt upper trap, cervical paraspinals, Rt infraspinatus.  CERVICAL ROM:   ROM AROM (deg) 04/18/2023 AROM 04/25/2023  Flexion 40 c Lt  pain  55  Extension 42 c Rt pain 48  Right lateral flexion    Left lateral flexion    Right rotation 30  30 (55 deg)  Left rotation 38 40   (Blank rows = not tested)  UPPER EXTREMITY ROM:   ROM  Right 04/18/2023 Left 04/18/2023  Shoulder flexion    Shoulder extension    Shoulder abduction    Shoulder adduction    Shoulder extension    Shoulder internal rotation    Shoulder external rotation    Elbow flexion    Elbow extension    Wrist flexion    Wrist extension    Wrist ulnar deviation    Wrist radial deviation    Wrist pronation    Wrist supination     (Blank rows = not tested)  UPPER EXTREMITY MMT:  MMT Right 04/18/2023 Left 04/18/2023  Shoulder flexion 5/5 5/5  Shoulder extension    Shoulder abduction 5/5 5/5  Shoulder adduction    Shoulder extension    Shoulder internal rotation 5/5 4/5  Shoulder external rotation 5/5 5/5  Middle trapezius    Lower trapezius    Elbow flexion 5/5 5/5  Elbow extension 5/5 5/5  Wrist flexion    Wrist extension    Wrist ulnar deviation    Wrist radial deviation    Wrist pronation    Wrist supination    Grip strength     (Blank rows = not tested)  CERVICAL SPECIAL TESTS:  04/18/2023 Cervical translatory glides limited mild to moderate throughout cervical region.   FUNCTIONAL TESTS:  04/18/2023 No testing  TODAY'S TREATMENT:                                                                                                       DATE:  04/25/2023 Therex: Verbal review of existing HEP. C moist heat on Rt upper trap Seated Rt upper trap stretch 15 sec x 3 with moist heat on Rt upper trap  Standing green band rows x 20 Standing Green band gh ext x 20  Seated scapular retraction c bilateral shoulder ER green band x15    Manual: Prone cPA T1-T5  G3  Trigger Point Dry-Needling  Treatment instructions: Expect mild to moderate muscle soreness. S/S of pneumothorax if dry needled over a lung field, and to seek immediate medical attention should they occur.  Patient verbalized understanding of these instructions and education.  Patient Consent Given: Yes Education handout provided: Previously provided Muscles treated: Rt upper trap Treatment response/outcome: concordant    TODAY'S TREATMENT:                                                                                                       DATE:  04/18/2023 Therex:    HEP instruction/performance c cues for techniques, handout provided.  Trial set performed of each for comprehension and symptom assessment.  See below for exercise list  Manual: Supine cervical downslope C4-C7 g2-g3 for mobilty gains, performed bilaterally   PATIENT EDUCATION:  Education details: HEP, POC Person educated: Patient Education method: Explanation, Demonstration, Verbal cues, and Handouts Education comprehension: verbalized understanding, returned demonstration, and verbal cues required  HOME EXERCISE PROGRAM: Access Code: [email or web address] URL: [email or web address] Date: 04/18/2023 Prepared by: Chyrel Masson  Exercises - Supine Cervical Retraction with Towel  - 1-2 x daily - 7 x weekly - 1 sets - 10 reps - 5 hold - Cervical Retraction at Wall  - 2 x daily - 7 x weekly - 1 sets - 10 reps - 5 hold - Supine Cervical Rotation AROM on Pillow  - 1-2 x daily - 7 x weekly - 3 sets - 10 reps - Supine Scapular Retraction  - 1-2 x daily - 7 x weekly - 1 sets - 10 reps - 5 hold - Seated Scapular Retraction  - 1-2 x daily - 7 x weekly - 1 sets - 10 reps - 3-5 hold - Seated Assisted Cervical Rotation with Towel  - 2-3 x daily - 7 x weekly - 1 sets - 10 reps - 2-3 hold  ASSESSMENT:  CLINICAL IMPRESSION: Initial response to dry needling and manual thoracic mobility was improved Rt rotation in cervical spine noted.  Good knowledge in HEP at this time.  Pt to continue to  benefit from progressive mobility gains with manual/dry needling and there ex intervention as well as improved scapulothoracic strength/endurance.       OBJECTIVE IMPAIRMENTS: decreased activity tolerance, decreased endurance, decreased mobility, decreased ROM, decreased strength, hypomobility, increased fascial restrictions, impaired perceived functional ability, increased muscle spasms, impaired flexibility, impaired UE functional use, improper body mechanics, postural dysfunction, and pain.   ACTIVITY LIMITATIONS: carrying, lifting, bending, sitting, sleeping, dressing, reach over head, and hygiene/grooming  PARTICIPATION LIMITATIONS: cleaning, laundry, driving, community activity, and yard work  PERSONAL FACTORS: Time since onset of injury/illness/exacerbation and History of breast cancer, kidney stones, arthritis, Lt TSA  are also affecting patient's functional outcome.   REHAB POTENTIAL: Good  CLINICAL DECISION MAKING: Stable/uncomplicated  EVALUATION COMPLEXITY: Low   GOALS: Goals reviewed with patient? Yes  SHORT TERM GOALS: (target date for Short term goals are 3 weeks 05/09/2023)  1.Patient will demonstrate independent use of home exercise program to maintain progress from in clinic treatments. Goal status: on going 04/25/2023  LONG TERM GOALS: (target dates for all long term goals are 10 weeks  06/27/2023 )   1. Patient will demonstrate/report pain at worst less than or equal to 2/10 to facilitate minimal limitation in daily activity secondary to pain symptoms. Goal status: New   2. Patient will demonstrate independent use of home exercise program to facilitate ability to maintain/progress functional gains from skilled physical therapy services. Goal status: New   3. Patient will demonstrate FOTO outcome > or = 67 % to indicate reduced disability due to condition. Goal status: New   4.  Patient will demonstrate cervical AROM WFL s symptoms to facilitate usual head movements for daily activity including driving, self care.   Goal status: New   5.  Patient will demonstrate/report ability to sleep s restriction  due to symptoms.   Goal status: New   6.  Patient will demonstrate/report ability to perform yard/garden activity at [facility].  Goal status: New     PLAN:  PT FREQUENCY: 1-2x/week  PT DURATION: 10 weeks  PLANNED INTERVENTIONS: Therapeutic exercises, Therapeutic activity, Neuro Muscular re-education, Balance training, Gait training, Patient/Family education, Joint mobilization, Stair training, DME instructions, Dry Needling, Electrical stimulation, Cryotherapy, vasopneumatic device,Traction, Moist heat, Taping, Ultrasound, Ionotophoresis 4mg /ml Dexamethasone, and aquatic therapy, Manual therapy.  All included unless contraindicated  PLAN FOR NEXT SESSION: DN as desired.  Cervical joint mobility improvements.    Chyrel Masson, PT, DPT, OCS, ATC 04/25/23  12:14 PM

## 2023-04-30 ENCOUNTER — Encounter: Payer: Self-pay | Admitting: Physical Therapy

## 2023-04-30 ENCOUNTER — Ambulatory Visit (INDEPENDENT_AMBULATORY_CARE_PROVIDER_SITE_OTHER): Payer: Medicare Other | Admitting: Physical Therapy

## 2023-04-30 DIAGNOSIS — M542 Cervicalgia: Secondary | ICD-10-CM

## 2023-04-30 DIAGNOSIS — R293 Abnormal posture: Secondary | ICD-10-CM

## 2023-04-30 NOTE — Therapy (Signed)
OUTPATIENT PHYSICAL THERAPY TREATMENT   Patient Name: SHAELYNN BURKEMPER MRN: [id number] DOB:Jul 15, 1946, 77 y.o., female Today's Date: 04/30/2023  END OF SESSION:  PT End of Session - 04/30/23 1139     Visit Number 3    Number of Visits 20    Date for PT Re-Evaluation 06/27/23    Authorization Type Medicare [name]    PT Start Time 1140    PT Stop Time 1221    PT Time Calculation (min) 41 min    Activity Tolerance Patient tolerated treatment well    Behavior During Therapy WFL for tasks assessed/performed               Past Medical History:  Diagnosis Date   Arthritis    Breast cancer (HCC)    Left breast   Depression    "mild", no meds   History of kidney stones    Past Surgical History:  Procedure Laterality Date   breast cancer surgery     BREAST ENHANCEMENT SURGERY     BREAST LUMPECTOMY Left    CATARACT EXTRACTION, BILATERAL     ECTOPIC PREGNANCY SURGERY     EYE SURGERY     INNER EAR SURGERY     TONSILLECTOMY     TOTAL SHOULDER ARTHROPLASTY Left 04/25/2018   TOTAL SHOULDER ARTHROPLASTY Left 04/25/2018   Procedure: LEFT TOTAL SHOULDER REPLACEMENT;  Surgeon: Cammy Copa, MD;  Location: [facility] OR;  Service: Orthopedics;  Laterality: Left;   TUBAL LIGATION     Patient Active Problem List   Diagnosis Date Noted   Weakness    Hyperlipidemia    Acute respiratory failure due to COVID-19 (HCC) 06/10/2020   Pneumonia due to COVID-19 virus 06/10/2020   Hx of multiple pulmonary nodules 06/10/2020   Acute respiratory failure with hypoxia (HCC)    Lung nodule seen on imaging study 01/08/2020   History of breast cancer 04/14/2019   Sepsis (HCC) 04/02/2019   CAP (community acquired pneumonia) 04/02/2019   Elevated troponin 04/02/2019   Shoulder arthritis 04/25/2018   Primary osteoarthritis, 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   Chronic left shoulder pain 05/17/2017   Cervical disc disorder with radiculopathy 08/14/2016   Chronic infection of sinus 08/31/2015   Cough  08/31/2015   Allergic reaction 08/31/2015    PCP: Marisue Ivan , MD  REFERRING PROVIDER: Kathryne Hitch, MD  REFERRING DIAG: M54.2 (ICD-10-CM) - Cervicalgia  THERAPY DIAG:  Cervicalgia  Abnormal posture  Rationale for Evaluation and Treatment: Rehabilitation  ONSET DATE: Nov 2023  SUBJECTIVE:  SUBJECTIVE STATEMENT: Some days are better than others; more stiff today   PERTINENT HISTORY:  History of breast cancer, kidney stones, arthritis, Lt TSA  PAIN:  NPRS scale: current 2-3/10 Pain location: cervical Rt Pain description: stiffness/tightness, sharp Aggravating factors: waking up stuff, using hand tools in gardening, bending forward, head turns limited in driving Relieving factors: aleve   PRECAUTIONS: None  WEIGHT BEARING RESTRICTIONS: No  FALLS:  Has patient fallen in last 6 months? No  LIVING ENVIRONMENT: Lives in: House/apartment  PLOF: Independent, gardening, Rt hand dominant   PATIENT GOALS: Reduce pain, get back to activity  OBJECTIVE:   PATIENT SURVEYS:  04/18/2023 FOTO intake:  64  predicted:  67  COGNITION: 04/18/2023 Overall cognitive status: Within functional limits for tasks assessed  SENSATION: 04/18/2023 [facility]  POSTURE:  04/18/2023 Head positioning to Lt into lateral flexion, rounded shoulders.   PALPATION: 04/18/2023 Tenderness, trigger points in Rt upper trap, cervical paraspinals, Rt infraspinatus.  CERVICAL ROM:   ROM AROM (deg) 04/18/2023 AROM 04/25/2023  Flexion 40 c Lt  pain  55  Extension 42 c Rt pain 48  Right lateral flexion    Left lateral flexion    Right rotation 30  30 (55 deg)  Left rotation 38 40   (Blank rows = not tested)  UPPER EXTREMITY ROM:   ROM Right 04/18/2023 Left 04/18/2023  Shoulder flexion     Shoulder extension    Shoulder abduction    Shoulder adduction    Shoulder extension    Shoulder internal rotation    Shoulder external rotation    Elbow flexion    Elbow extension    Wrist flexion    Wrist extension    Wrist ulnar deviation    Wrist radial deviation    Wrist pronation    Wrist supination     (Blank rows = not tested)  UPPER EXTREMITY MMT:  MMT Right 04/18/2023 Left 04/18/2023  Shoulder flexion 5/5 5/5  Shoulder extension    Shoulder abduction 5/5 5/5  Shoulder adduction    Shoulder extension    Shoulder internal rotation 5/5 4/5  Shoulder external rotation 5/5 5/5  Middle trapezius    Lower trapezius    Elbow flexion 5/5 5/5  Elbow extension 5/5 5/5  Wrist flexion    Wrist extension    Wrist ulnar deviation    Wrist radial deviation    Wrist pronation    Wrist supination    Grip strength     (Blank rows = not tested)  CERVICAL SPECIAL TESTS:  04/18/2023 Cervical translatory glides limited mild to moderate throughout cervical region.   FUNCTIONAL TESTS:  04/18/2023 No testing  TODAY'S TREATMENT:                                                                                                       DATE:  04/30/2023 Therex: UBE L2 x 6 min (3 min each direction) Bil ER with scapular retraction 2x10; L3 Rows L3 band 2x10 Shoulder extension L3 band 2x10 Y to W back 2x10  Cervical side bending x10 bil AROM  Manual: STM with compression to Rt upper trap; cervical paraspinals and suboccipitals  Trigger Point Dry-Needling  Treatment instructions: Expect mild to moderate muscle soreness. S/S of pneumothorax if dry needled over a lung field, and to seek immediate medical attention should they occur. Patient verbalized understanding of these instructions and education.  Patient  Consent Given: Yes Education handout provided: Previously provided Muscles treated: Rt upper trap, Rt suboccipitals Treatment response/outcome: twitch responses with reduction in symptoms following     TODAY'S TREATMENT:                                                                                                       DATE:  04/25/2023 Therex: Verbal review of existing HEP. C moist heat on Rt upper trap Seated Rt upper trap stretch 15 sec x 3 with moist heat on Rt upper trap  Standing green band rows x 20 Standing Green band gh ext x 20  Seated scapular retraction c bilateral shoulder ER green band x15    Manual: Prone cPA T1-T5  G3  Trigger Point Dry-Needling  Treatment instructions: Expect mild to moderate muscle soreness. S/S of pneumothorax if dry needled over a lung field, and to seek immediate medical attention should they occur. Patient verbalized understanding of these instructions and education.  Patient Consent Given: Yes Education handout provided: Previously provided Muscles treated: Rt upper trap Treatment response/outcome: concordant    TODAY'S TREATMENT:                                                                                                       DATE:  04/18/2023 Therex:    HEP instruction/performance c cues for techniques, handout provided.  Trial set performed of each for comprehension and symptom assessment.  See below for exercise list  Manual: Supine cervical  downslope C4-C7 g2-g3 for mobilty gains, performed bilaterally   PATIENT EDUCATION:  Education details: HEP, POC Person educated: Patient Education method: Explanation, Demonstration, Verbal cues, and Handouts Education comprehension: verbalized understanding, returned demonstration, and verbal cues required  HOME EXERCISE PROGRAM: Access Code: [email or web address] URL: [email or web address] Date: 04/18/2023 Prepared by: Chyrel Masson  Exercises - Supine Cervical Retraction with  Towel  - 1-2 x daily - 7 x weekly - 1 sets - 10 reps - 5 hold - Cervical Retraction at Wall  - 2 x daily - 7 x weekly - 1 sets - 10 reps - 5 hold - Supine Cervical Rotation AROM on Pillow  - 1-2 x daily - 7 x weekly - 3 sets - 10 reps - Supine Scapular Retraction  - 1-2 x daily - 7 x weekly - 1 sets - 10 reps - 5 hold - Seated Scapular Retraction  - 1-2 x daily - 7 x weekly - 1 sets - 10 reps - 3-5 hold - Seated Assisted Cervical Rotation with Towel  - 2-3 x daily - 7 x weekly - 1 sets - 10 reps - 2-3 hold  ASSESSMENT:  CLINICAL IMPRESSION: Pt tolerated session well today with positive response to manual and dry needling today.  Improved tolerance to DN today as well.  Will continue to benefit from PT to maximize function.   OBJECTIVE IMPAIRMENTS: decreased activity tolerance, decreased endurance, decreased mobility, decreased ROM, decreased strength, hypomobility, increased fascial restrictions, impaired perceived functional ability, increased muscle spasms, impaired flexibility, impaired UE functional use, improper body mechanics, postural dysfunction, and pain.   ACTIVITY LIMITATIONS: carrying, lifting, bending, sitting, sleeping, dressing, reach over head, and hygiene/grooming  PARTICIPATION LIMITATIONS: cleaning, laundry, driving, community activity, and yard work  PERSONAL FACTORS: Time since onset of injury/illness/exacerbation and History of breast cancer, kidney stones, arthritis, Lt TSA  are also affecting patient's functional outcome.   REHAB POTENTIAL: Good  CLINICAL DECISION MAKING: Stable/uncomplicated  EVALUATION COMPLEXITY: Low   GOALS: Goals reviewed with patient? Yes  SHORT TERM GOALS: (target date for Short term goals are 3 weeks 05/09/2023)  1.Patient will demonstrate independent use of home exercise program to maintain progress from in clinic treatments. Goal status: on going 04/25/2023  LONG TERM GOALS: (target dates for all long term goals are 10 weeks   06/27/2023 )   1. Patient will demonstrate/report pain at worst less than or equal to 2/10 to facilitate minimal limitation in daily activity secondary to pain symptoms. Goal status: New   2. Patient will demonstrate independent use of home exercise program to facilitate ability to maintain/progress functional gains from skilled physical therapy services. Goal status: New   3. Patient will demonstrate FOTO outcome > or = 67 % to indicate reduced disability due to condition. Goal status: New   4.  Patient will demonstrate cervical AROM WFL s symptoms to facilitate usual head movements for daily activity including driving, self care.   Goal status: New   5.  Patient will demonstrate/report ability to sleep s restriction due to symptoms.   Goal status: New   6.  Patient will demonstrate/report ability to perform yard/garden activity at [facility].  Goal status: New     PLAN:  PT FREQUENCY: 1-2x/week  PT DURATION: 10 weeks  PLANNED INTERVENTIONS: Therapeutic exercises, Therapeutic activity, Neuro Muscular re-education, Balance training, Gait training, Patient/Family education, Joint mobilization, Stair training, DME instructions, Dry Needling, Electrical stimulation, Cryotherapy, vasopneumatic device,Traction, Moist heat, Taping, Ultrasound, Ionotophoresis 4mg /ml Dexamethasone, and aquatic therapy, Manual therapy.  All  included unless contraindicated  PLAN FOR NEXT SESSION: assess response to DN, continue DN PRN,   Cervical joint mobility improvements, postural strengthening     Clarita Crane, PT, DPT 04/30/23 12:27 PM

## 2023-05-02 ENCOUNTER — Encounter: Payer: Self-pay | Admitting: Rehabilitative and Restorative Service Providers"

## 2023-05-02 ENCOUNTER — Encounter: Payer: Self-pay | Admitting: Orthopaedic Surgery

## 2023-05-02 ENCOUNTER — Ambulatory Visit (INDEPENDENT_AMBULATORY_CARE_PROVIDER_SITE_OTHER): Payer: Medicare Other | Admitting: Orthopaedic Surgery

## 2023-05-02 ENCOUNTER — Ambulatory Visit (INDEPENDENT_AMBULATORY_CARE_PROVIDER_SITE_OTHER): Payer: Medicare Other | Admitting: Rehabilitative and Restorative Service Providers"

## 2023-05-02 DIAGNOSIS — R293 Abnormal posture: Secondary | ICD-10-CM | POA: Diagnosis not present

## 2023-05-02 DIAGNOSIS — M542 Cervicalgia: Secondary | ICD-10-CM

## 2023-05-02 NOTE — Progress Notes (Signed)
The patient is a 77 year old female well-known to me.  She is coming in to go over a MRI of her cervical spine.  She has been dealing with neck pain for some time now and it does wake her up almost on a nightly basis.  She still denies any radicular symptoms down her arms.  She has been through chiropractic treatment and she is now in physical therapy for her neck.  She is still significantly stiff in terms of range of motion of that neck and has a lot of pain that wakes her up at night.  On my exam today her lateral rotation and lateral bending is limited in terms of stiffness and pain.  She has decent flexion and extension.  She has paraspinal muscular pain it does seem to radiate into the triceps.  She moves her upper extremities well and has good strength in her hands.  There is no radicular component and no weakness in her arms.  She denies any numbness and tingling in her hands.  MRI of her cervical spine does show significant edema at C1-C2 articulation to the right side.  The radiologist says this is worsened when compared to a study in 2017.  She does have some moderate foraminal stenosis at several levels but again there is not a radicular component from her exam standpoint.  Most of her pain is totally in cervical spine.  This is definitely affecting her activities day living and just really bothering her on a daily basis.  I would like her to continue with physical therapy on her cervical spine.  I would also like to send her to Dr. Christell Constant our spine specialist to see if he has any other recommendations as it relates to the stiffness of her neck especially as it relates to the edema at the right C1-C2 articulation.  They understand that I am making this referral being at a loss of what else that we can try for her other than anti-inflammatories and continue therapy.

## 2023-05-02 NOTE — Therapy (Signed)
OUTPATIENT PHYSICAL THERAPY TREATMENT   Patient Name: Brooke Cantrell MRN: [id number] DOB:April 09, 1946, 77 y.o., female Today's Date: 05/02/2023  END OF SESSION:  PT End of Session - 05/02/23 1552     Visit Number 4    Number of Visits 20    Date for PT Re-Evaluation 06/27/23    Authorization Type Medicare [name]    PT Start Time 1540    PT Stop Time 1619    PT Time Calculation (min) 39 min    Activity Tolerance Patient tolerated treatment well    Behavior During Therapy WFL for tasks assessed/performed                Past Medical History:  Diagnosis Date   Arthritis    Breast cancer (HCC)    Left breast   Depression    "mild", no meds   History of kidney stones    Past Surgical History:  Procedure Laterality Date   breast cancer surgery     BREAST ENHANCEMENT SURGERY     BREAST LUMPECTOMY Left    CATARACT EXTRACTION, BILATERAL     ECTOPIC PREGNANCY SURGERY     EYE SURGERY     INNER EAR SURGERY     TONSILLECTOMY     TOTAL SHOULDER ARTHROPLASTY Left 04/25/2018   TOTAL SHOULDER ARTHROPLASTY Left 04/25/2018   Procedure: LEFT TOTAL SHOULDER REPLACEMENT;  Surgeon: Cammy Copa, MD;  Location: [facility] OR;  Service: Orthopedics;  Laterality: Left;   TUBAL LIGATION     Patient Active Problem List   Diagnosis Date Noted   Weakness    Hyperlipidemia    Acute respiratory failure due to COVID-19 (HCC) 06/10/2020   Pneumonia due to COVID-19 virus 06/10/2020   Hx of multiple pulmonary nodules 06/10/2020   Acute respiratory failure with hypoxia (HCC)    Lung nodule seen on imaging study 01/08/2020   History of breast cancer 04/14/2019   Sepsis (HCC) 04/02/2019   CAP (community acquired pneumonia) 04/02/2019   Elevated troponin 04/02/2019   Shoulder arthritis 04/25/2018   Primary osteoarthritis, 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   Chronic left shoulder pain 05/17/2017   Cervical disc disorder with radiculopathy 08/14/2016   Chronic infection of sinus 08/31/2015   Cough  08/31/2015   Allergic reaction 08/31/2015    PCP: Marisue Ivan , MD  REFERRING PROVIDER: Kathryne Hitch, MD  REFERRING DIAG: M54.2 (ICD-10-CM) - Cervicalgia  THERAPY DIAG:  Cervicalgia  Abnormal posture  Rationale for Evaluation and Treatment: Rehabilitation  ONSET DATE: Nov 2023  SUBJECTIVE:  SUBJECTIVE STATEMENT: Pt indicated having a follow up scheduled with Dr. Christell Constant related to neck.  Pt indicated taking less aleve than before.  Reported still having limitations in mobility.    PERTINENT HISTORY:  History of breast cancer, kidney stones, arthritis, Lt TSA  PAIN:  NPRS scale: 3/10 upon arrival.  Pain location: cervical Rt Pain description: stiffness/tightness, sharp Aggravating factors: waking up stuff, using hand tools in gardening, bending forward, head turns limited in driving Relieving factors: aleve   PRECAUTIONS: None  WEIGHT BEARING RESTRICTIONS: No  FALLS:  Has patient fallen in last 6 months? No  LIVING ENVIRONMENT: Lives in: House/apartment  PLOF: Independent, gardening, Rt hand dominant   PATIENT GOALS: Reduce pain, get back to activity  OBJECTIVE:   PATIENT SURVEYS:  04/18/2023 FOTO intake:  64  predicted:  67  COGNITION: 04/18/2023 Overall cognitive status: Within functional limits for tasks assessed  SENSATION: 04/18/2023 [facility]  POSTURE:  04/18/2023 Head positioning to Lt into lateral flexion, rounded shoulders.   PALPATION: 04/18/2023 Tenderness, trigger points in Rt upper trap, cervical paraspinals, Rt infraspinatus.  CERVICAL ROM:   ROM AROM (deg) 04/18/2023 AROM 04/25/2023 05/02/2023 AROM  Flexion 40 c Lt  pain  55   Extension 42 c Rt pain 48   Right lateral flexion     Left lateral flexion     Right rotation 30  30 (55  deg) 45 (post manual 55)  Left rotation 38 40 38 (post manual 48)   (Blank rows = not tested)  UPPER EXTREMITY ROM:   ROM Right 04/18/2023 Left 04/18/2023  Shoulder flexion    Shoulder extension    Shoulder abduction    Shoulder adduction    Shoulder extension    Shoulder internal rotation    Shoulder external rotation    Elbow flexion    Elbow extension    Wrist flexion    Wrist extension    Wrist ulnar deviation    Wrist radial deviation    Wrist pronation    Wrist supination     (Blank rows = not tested)  UPPER EXTREMITY MMT:  MMT Right 04/18/2023 Left 04/18/2023  Shoulder flexion 5/5 5/5  Shoulder extension    Shoulder abduction 5/5 5/5  Shoulder adduction    Shoulder extension    Shoulder internal rotation 5/5 4/5  Shoulder external rotation 5/5 5/5  Middle trapezius    Lower trapezius    Elbow flexion 5/5 5/5  Elbow extension 5/5 5/5  Wrist flexion    Wrist extension    Wrist ulnar deviation    Wrist radial deviation    Wrist pronation    Wrist supination    Grip strength     (Blank rows = not tested)  CERVICAL SPECIAL TESTS:  04/18/2023 Cervical translatory glides limited mild to moderate throughout cervical region.   FUNCTIONAL TESTS:  04/18/2023 No testing  TODAY'S TREATMENT:                                                                                                       DATE:  05/02/2023 Therex: UBE L2 x 6 min (3 min each direction) Cervical side bending x10 bil AROM Towel assisted cervical rotation x 10 bilateral pre manual x 10 post manual each side   Manual: STM with compression to Rt upper trap.  Prone cPA T1-T5 g4.  uPA C5, C6, C7 g3 bilaterally  Trigger Point Dry-Needling  Treatment instructions: Expect mild to moderate muscle soreness. S/S of pneumothorax if dry  needled over a lung field, and to seek immediate medical attention should they occur. Patient verbalized understanding of these instructions and education.  Patient Consent Given: Yes Education handout provided: Previously provided Muscles treated: Rt upper trap, Rt suboccipitals Treatment response/outcome: twitch responses with reduction in symptoms following  TODAY'S TREATMENT:                                                                                                       DATE:  04/30/2023 Therex: UBE L2 x 6 min (3 min each direction) Bil ER with scapular retraction 2x10; L3 Rows L3 band 2x10 Shoulder extension L3 band 2x10 Y to W back 2x10  Cervical side bending x10 bil AROM  Manual: STM with compression to Rt upper trap; cervical paraspinals and suboccipitals  Trigger Point Dry-Needling  Treatment instructions: Expect mild to moderate muscle soreness. S/S of pneumothorax if dry needled over a lung field, and to seek immediate medical attention should they occur. Patient verbalized understanding of these instructions and education.  Patient Consent Given: Yes Education handout provided: Previously provided Muscles treated: Rt upper trap, Rt suboccipitals Treatment response/outcome: twitch responses with reduction in symptoms following    TODAY'S TREATMENT:                                                                                                       DATE:  04/25/2023 Therex: Verbal review of existing HEP. C moist heat on Rt upper trap Seated Rt upper trap stretch 15 sec x 3 with moist heat on Rt upper trap  Standing green band rows x 20 Standing Green  band gh ext x 20  Seated scapular retraction c bilateral shoulder ER green band x15    Manual: Prone cPA T1-T5  G3  Trigger Point Dry-Needling  Treatment instructions: Expect mild to moderate muscle soreness. S/S of pneumothorax if dry needled over a lung field, and to seek immediate medical attention should they  occur. Patient verbalized understanding of these instructions and education.  Patient Consent Given: Yes Education handout provided: Previously provided Muscles treated: Rt upper trap Treatment response/outcome: concordant    PATIENT EDUCATION:  Education details: HEP, POC Person educated: Patient Education method: Explanation, Demonstration, Verbal cues, and Handouts Education comprehension: verbalized understanding, returned demonstration, and verbal cues required  HOME EXERCISE PROGRAM: Access Code: [email or web address] URL: [email or web address] Date: 04/18/2023 Prepared by: Chyrel Masson  Exercises - Supine Cervical Retraction with Towel  - 1-2 x daily - 7 x weekly - 1 sets - 10 reps - 5 hold - Cervical Retraction at Wall  - 2 x daily - 7 x weekly - 1 sets - 10 reps - 5 hold - Supine Cervical Rotation AROM on Pillow  - 1-2 x daily - 7 x weekly - 3 sets - 10 reps - Supine Scapular Retraction  - 1-2 x daily - 7 x weekly - 1 sets - 10 reps - 5 hold - Seated Scapular Retraction  - 1-2 x daily - 7 x weekly - 1 sets - 10 reps - 3-5 hold - Seated Assisted Cervical Rotation with Towel  - 2-3 x daily - 7 x weekly - 1 sets - 10 reps - 2-3 hold  ASSESSMENT:  CLINICAL IMPRESSION: Upper trap tightness continued to show improvement.  Arthritic tightness in cervical confirmed with MRI reporting.  Pt to see Dr. Christell Constant for follow up related to the MRI findings.  At this time, continued efforts with skilled PT services and HEP indicated to promote improved mobility as tolerated.    OBJECTIVE IMPAIRMENTS: decreased activity tolerance, decreased endurance, decreased mobility, decreased ROM, decreased strength, hypomobility, increased fascial restrictions, impaired perceived functional ability, increased muscle spasms, impaired flexibility, impaired UE functional use, improper body mechanics, postural dysfunction, and pain.   ACTIVITY LIMITATIONS: carrying, lifting, bending, sitting, sleeping,  dressing, reach over head, and hygiene/grooming  PARTICIPATION LIMITATIONS: cleaning, laundry, driving, community activity, and yard work  PERSONAL FACTORS: Time since onset of injury/illness/exacerbation and History of breast cancer, kidney stones, arthritis, Lt TSA  are also affecting patient's functional outcome.   REHAB POTENTIAL: Good  CLINICAL DECISION MAKING: Stable/uncomplicated  EVALUATION COMPLEXITY: Low   GOALS: Goals reviewed with patient? Yes  SHORT TERM GOALS: (target date for Short term goals are 3 weeks 05/09/2023)  1.Patient will demonstrate independent use of home exercise program to maintain progress from in clinic treatments. Goal status: met  LONG TERM GOALS: (target dates for all long term goals are 10 weeks  06/27/2023 )   1. Patient will demonstrate/report pain at worst less than or equal to 2/10 to facilitate minimal limitation in daily activity secondary to pain symptoms. Goal status: New   2. Patient will demonstrate independent use of home exercise program to facilitate ability to maintain/progress functional gains from skilled physical therapy services. Goal status: New   3. Patient will demonstrate FOTO outcome > or = 67 % to indicate reduced disability due to condition. Goal status: New   4.  Patient will demonstrate cervical AROM WFL s symptoms to facilitate usual head movements for daily activity including driving, self care.   Goal status: New   5.  Patient will demonstrate/report ability to sleep s restriction due to symptoms.   Goal status: New   6.  Patient will demonstrate/report ability to perform yard/garden activity at [facility].  Goal status: New     PLAN:  PT FREQUENCY: 1-2x/week  PT DURATION: 10 weeks  PLANNED INTERVENTIONS: Therapeutic exercises, Therapeutic activity, Neuro Muscular re-education, Balance training, Gait training, Patient/Family education, Joint mobilization, Stair training, DME instructions, Dry Needling,  Electrical stimulation, Cryotherapy, vasopneumatic device,Traction, Moist heat, Taping, Ultrasound, Ionotophoresis 4mg /ml Dexamethasone, and aquatic therapy, Manual therapy.  All included unless contraindicated  PLAN FOR NEXT SESSION: LTG reassessment.  Manual for joint mobility improvements .    Chyrel Masson, PT, DPT, OCS, ATC 05/02/23  4:23 PM

## 2023-05-07 ENCOUNTER — Encounter: Payer: Medicare Other | Admitting: Rehabilitative and Restorative Service Providers"

## 2023-05-07 ENCOUNTER — Encounter: Payer: Self-pay | Admitting: Orthopedic Surgery

## 2023-05-07 ENCOUNTER — Other Ambulatory Visit (INDEPENDENT_AMBULATORY_CARE_PROVIDER_SITE_OTHER): Payer: Medicare Other

## 2023-05-07 ENCOUNTER — Ambulatory Visit (INDEPENDENT_AMBULATORY_CARE_PROVIDER_SITE_OTHER): Payer: Medicare Other | Admitting: Orthopedic Surgery

## 2023-05-07 VITALS — BP 134/86 | HR 76 | Ht 65.0 in | Wt 136.0 lb

## 2023-05-07 DIAGNOSIS — M542 Cervicalgia: Secondary | ICD-10-CM | POA: Diagnosis not present

## 2023-05-07 NOTE — Progress Notes (Signed)
Orthopedic Spine Surgery Office Note  Assessment: Patient is a 77 y.o. female with neck pain. 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 Discussed that the conservative treatments include:  -activity modification  -physical therapy  -over the counter pain medications  -topical voltaren gel  -ice/heat -Patient has tried heat, Aleve -Recommended Tylenol up to 1000 mg 3 times daily.  Continue with Aleve and ice/heat.  She could try Voltaren gel as well -Continue to work with PT -Discussed surgery as an option should conservative treatments fail to ride her with significant relief.  Discussed C1/2 fusion as an option.  Went over both the transarticular and harms technique.  Told her I would need a CT scan to assess which option would be better -Patient should return to office on as-needed basis   I spent over 30 minutes with the patient going over her history, her exam, her imaging (x-ray and MRI), and the surgical options.  She was interested in those so we spent over 10 minutes talking about the harm technique and the transarticular technique with some of their respective drawbacks and potential for complication.  ___________________________________________________________________________   History:  Patient is a 77 y.o. female who presents today for cervical spine.  Patient has had pain in her neck since November 2023.  There was no trauma or injury that preceded the onset of pain.  She does not have any pain radiating to either upper extremity.  Pain is felt on a daily basis.  She feels it mostly on the right side of her neck.  She states it is worse with rotation.  She gives the example of driving and having to check her blind spot which can be particular painful.  She has been using Aleve for the most part to try to help with the pain.  The pain  waxes and wanes in terms of intensity depending on the day.  Has felt some popping and heard a audible popping in her neck.   Weakness: denies Difficulty with fine motor skills (e.g., buttoning shirts, handwriting): denies Symptoms of imbalance: denies Paresthesias and numbness: denies Bowel or bladder incontinence: denies Saddle anesthesia: denies  Treatments tried: aleve, PT  Review of systems: Denies fevers and chills, night sweats, unexplained weight loss. Has had pain that wakes her at night, history of breast cancer  Past medical history: Osteopenia Breast cancer   Allergies: ciprofloxacin  Past surgical history:  Breast cancer lumpectomy Left shoulder replacement Tubal ligation Cataract surgery Ectopic pregnancy surgery Tonsillectomy  Social history: Denies use of nicotine product (smoking, vaping, patches, smokeless) Alcohol use: yes, about 5 drinks per week Denies recreational drug use   Physical Exam:  BMI of 22.6  General: no acute distress, appears stated age Neurologic: alert, answering questions appropriately, following commands Respiratory: unlabored breathing on room air, symmetric chest rise Psychiatric: appropriate affect, normal cadence to speech   MSK (spine):  -Strength exam      Left  Right Grip strength                5/5  5/5 Interosseus   5/5   5/5 Wrist extension  5/5  5/5 Wrist flexion   5/5  5/5 Elbow flexion   5/5  5/5 Deltoid    5/5  5/5  -Sensory exam    Sensation intact to light touch in C5-T1 nerve distributions of bilateral upper extremities  Pain  with rotation at the cervical spine. Not as much pain with flexion/extension. Could rotate about 15 degrees to the right and 40 to the left   Imaging: XRs of the cervical spine from 05/07/2023 was independently reviewed and interpreted, showing disc height loss with anterior osteophyte formation at C4/5, C5/6, C6/7.  No evidence of instability on flexion/extension views.  No  fracture or dislocation seen.  Decreased joint space on the right at C1/2 seen on the odontoid view.  Difficult to visualize the left C1/2 articulation  MRI of the cervical spine from 04/10/2023 was independently reviewed and interpreted, showing left sided foraminal stenosis at C3/4. No other significant stenosis seen. No T2 cord signal change. Facet arthropathy with effusion on the right at C1/2.    Patient name: Brooke Cantrell Patient MRN: [id number] Date of visit: 05/07/23

## 2023-05-09 ENCOUNTER — Ambulatory Visit (INDEPENDENT_AMBULATORY_CARE_PROVIDER_SITE_OTHER): Payer: Medicare Other | Admitting: Rehabilitative and Restorative Service Providers"

## 2023-05-09 ENCOUNTER — Encounter: Payer: Self-pay | Admitting: Rehabilitative and Restorative Service Providers"

## 2023-05-09 DIAGNOSIS — R293 Abnormal posture: Secondary | ICD-10-CM | POA: Diagnosis not present

## 2023-05-09 DIAGNOSIS — M542 Cervicalgia: Secondary | ICD-10-CM | POA: Diagnosis not present

## 2023-05-09 NOTE — Therapy (Signed)
OUTPATIENT PHYSICAL THERAPY TREATMENT   Patient Name: Brooke Cantrell MRN: [id number] DOB:01-08-1946, 77 y.o., female Today's Date: 05/09/2023  END OF SESSION:  PT End of Session - 05/09/23 1511     Visit Number 5    Number of Visits 20    Date for PT Re-Evaluation 06/27/23    Authorization Type Medicare [name]    PT Start Time 1510    PT Stop Time 1549    PT Time Calculation (min) 39 min    Activity Tolerance Patient tolerated treatment well    Behavior During Therapy WFL for tasks assessed/performed                 Past Medical History:  Diagnosis Date   Arthritis    Breast cancer (HCC)    Left breast   Depression    "mild", no meds   History of kidney stones    Past Surgical History:  Procedure Laterality Date   breast cancer surgery     BREAST ENHANCEMENT SURGERY     BREAST LUMPECTOMY Left    CATARACT EXTRACTION, BILATERAL     ECTOPIC PREGNANCY SURGERY     EYE SURGERY     INNER EAR SURGERY     TONSILLECTOMY     TOTAL SHOULDER ARTHROPLASTY Left 04/25/2018   TOTAL SHOULDER ARTHROPLASTY Left 04/25/2018   Procedure: LEFT TOTAL SHOULDER REPLACEMENT;  Surgeon: Cammy Copa, MD;  Location: [facility] OR;  Service: Orthopedics;  Laterality: Left;   TUBAL LIGATION     Patient Active Problem List   Diagnosis Date Noted   Weakness    Hyperlipidemia    Acute respiratory failure due to COVID-19 (HCC) 06/10/2020   Pneumonia due to COVID-19 virus 06/10/2020   Hx of multiple pulmonary nodules 06/10/2020   Acute respiratory failure with hypoxia (HCC)    Lung nodule seen on imaging study 01/08/2020   History of breast cancer 04/14/2019   Sepsis (HCC) 04/02/2019   CAP (community acquired pneumonia) 04/02/2019   Elevated troponin 04/02/2019   Shoulder arthritis 04/25/2018   Primary osteoarthritis, 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 Indicated having consult with Dr. Christell Constant.  Pt indicated discussion about possible fusion options but plan to continue to work without surgery at this time.   Pt indicated she felt she was improving some.   Pt indicated stiff /achy upon arrival.   PERTINENT HISTORY:  History of breast cancer, kidney stones, arthritis, Lt TSA  PAIN:  NPRS scale: 3/10 upon arrival.  Pain location: cervical Rt Pain description: stiffness/tightness, sharp Aggravating factors: waking up stuff, using hand tools in gardening, bending forward, head turns limited in driving Relieving factors: aleve   PRECAUTIONS: None  WEIGHT BEARING RESTRICTIONS: No  FALLS:  Has patient fallen in last 6 months? No  LIVING ENVIRONMENT: Lives in: House/apartment  PLOF: Independent, gardening, Rt hand dominant   PATIENT GOALS: Reduce pain, get back to activity  OBJECTIVE:   PATIENT SURVEYS:  04/18/2023 FOTO intake:  64  predicted:  67  COGNITION: 04/18/2023 Overall cognitive status: Within functional limits for tasks assessed  SENSATION: 04/18/2023 [facility]  POSTURE:  04/18/2023 Head positioning to Lt into lateral flexion, rounded shoulders.   PALPATION: 04/18/2023 Tenderness, trigger points in Rt upper trap, cervical paraspinals, Rt infraspinatus.  CERVICAL ROM:   ROM AROM (deg) 04/18/2023 AROM 04/25/2023 05/02/2023 AROM 05/09/2023 AROM  Flexion 40 c Lt  pain  55  50  Extension  42 c Rt pain 48  55  Right lateral flexion      Left lateral flexion      Right rotation 30  30 (55 deg) 45 (post manual 55) 55  Left rotation 38 40 38 (post manual 48) 50   (Blank rows = not tested)  UPPER EXTREMITY ROM:   ROM Right 04/18/2023 Left 04/18/2023  Shoulder flexion    Shoulder extension    Shoulder abduction    Shoulder adduction    Shoulder extension    Shoulder internal rotation    Shoulder external rotation    Elbow flexion    Elbow extension    Wrist flexion    Wrist extension    Wrist ulnar deviation    Wrist radial deviation    Wrist pronation    Wrist supination     (Blank rows = not tested)  UPPER EXTREMITY MMT:  MMT Right 04/18/2023 Left 04/18/2023  Shoulder flexion 5/5 5/5  Shoulder extension    Shoulder abduction 5/5 5/5  Shoulder adduction    Shoulder extension    Shoulder internal rotation 5/5 4/5  Shoulder external rotation 5/5 5/5  Middle trapezius    Lower trapezius    Elbow flexion 5/5 5/5  Elbow extension 5/5 5/5  Wrist flexion    Wrist extension    Wrist ulnar deviation    Wrist radial deviation    Wrist pronation    Wrist supination    Grip strength     (Blank rows = not tested)  CERVICAL SPECIAL TESTS:  04/18/2023 Cervical translatory glides limited mild to moderate throughout cervical region.   FUNCTIONAL TESTS:  04/18/2023 No testing  TODAY'S TREATMENT:                                                                                                       DATE:  05/09/2023 Therex: Tband rows blue band x 20 Tband GH ext blue band 3 sec hold x 20  Green band bilateral shoulder ER c scapular retraction 2 x 10  Towel assisted cervical rotation x 10 bilateral   Manual: STM with compression to Rt upper trap.  Prone cPA T1-T5 g3.  uPA C5, C6, C7 g3  bilaterally  Trigger Point Dry-Needling  Treatment instructions: Expect mild to moderate muscle soreness. S/S of pneumothorax if dry needled over a lung field, and to seek immediate medical attention should they occur. Patient verbalized understanding of these instructions and education.  Patient Consent Given: Yes Education handout provided: Previously provided Muscles treated: Rt upper trap, Rt suboccipitals Treatment response/outcome: twitch responses with reduction in symptoms following  TODAY'S TREATMENT:                                                                                                       DATE:  05/02/2023 Therex: UBE L2 x 6 min (3 min each direction) Cervical side bending x10 bil AROM Towel assisted cervical rotation x 10 bilateral pre manual x 10 post manual each side   Manual: STM with compression to Rt upper trap.  Prone cPA T1-T5 g4.  uPA C5, C6, C7 g3 bilaterally  Trigger Point Dry-Needling  Treatment instructions: Expect mild to moderate muscle soreness. S/S of pneumothorax if dry needled over a lung field, and to seek immediate medical attention should they occur. Patient verbalized understanding of these instructions and education.  Patient Consent Given: Yes Education handout provided: Previously provided Muscles treated: Rt upper trap, Rt suboccipitals Treatment response/outcome: twitch responses with reduction in symptoms following  TODAY'S TREATMENT:                                                                                                       DATE:  04/30/2023 Therex: UBE L2 x 6 min (3 min each direction) Bil ER with scapular retraction 2x10; L3 Rows L3 band 2x10 Shoulder extension L3 band 2x10 Y to W back 2x10  Cervical side bending x10 bil AROM  Manual: STM with compression to Rt upper trap; cervical paraspinals and suboccipitals  Trigger Point Dry-Needling  Treatment instructions: Expect mild to moderate muscle soreness. S/S of  pneumothorax if dry needled over a lung field, and to seek immediate medical attention should they occur. Patient verbalized understanding of these instructions and education.  Patient Consent Given: Yes Education handout provided: Previously provided Muscles treated: Rt upper trap, Rt suboccipitals Treatment response/outcome: twitch responses with reduction in symptoms following   PATIENT EDUCATION:  Education details: HEP, POC Person educated: Patient Education method: Programmer, multimedia, Demonstration, Verbal cues, and Handouts Education comprehension: verbalized understanding, returned demonstration, and verbal cues required  HOME EXERCISE PROGRAM: Access Code: [email or web address] URL: [email or web address] Date: 04/18/2023 Prepared by: Chyrel Masson  Exercises - Supine Cervical Retraction with Towel  - 1-2 x daily - 7 x weekly - 1 sets - 10 reps - 5 hold - Cervical Retraction at Wall  - 2 x daily - 7 x weekly - 1 sets - 10 reps - 5 hold - Supine Cervical Rotation AROM on Pillow  - 1-2 x daily - 7 x weekly - 3 sets - 10 reps - Supine Scapular Retraction  - 1-2 x daily - 7 x weekly - 1 sets - 10 reps - 5 hold - Seated Scapular Retraction  - 1-2 x daily - 7 x weekly - 1 sets - 10 reps - 3-5 hold - Seated Assisted Cervical Rotation with Towel  - 2-3 x daily - 7 x weekly - 1 sets - 10 reps - 2-3 hold  ASSESSMENT:  CLINICAL IMPRESSION: Pt reported improvement in symptoms and ability but symptoms/restrictions still noted.  Positive benefits have been noted with myofascial release techniques and recent addition of joint mobility manual intervention.  Pt to benefit from continued efforts to improve cervical mobility for daily activity.    OBJECTIVE IMPAIRMENTS: decreased activity tolerance, decreased endurance, decreased mobility, decreased ROM, decreased strength, hypomobility, increased fascial restrictions, impaired perceived functional ability, increased muscle spasms, impaired  flexibility, impaired UE functional use, improper body mechanics, postural dysfunction, and pain.   ACTIVITY LIMITATIONS: carrying, lifting, bending, sitting, sleeping, dressing, reach over head, and hygiene/grooming  PARTICIPATION LIMITATIONS: cleaning, laundry, driving, community activity, and yard work  PERSONAL FACTORS: Time since onset of injury/illness/exacerbation and History of breast cancer, kidney stones, arthritis, Lt TSA  are also affecting patient's functional outcome.   REHAB POTENTIAL: Good  CLINICAL DECISION MAKING: Stable/uncomplicated  EVALUATION COMPLEXITY: Low   GOALS: Goals reviewed with patient? Yes  SHORT TERM GOALS: (target date for Short term goals are 3 weeks 05/09/2023)  1.Patient will demonstrate independent use of home exercise program to maintain progress from in clinic treatments. Goal status: met  LONG TERM GOALS: (target dates for all long term goals are 10 weeks  06/27/2023 )   1. Patient will demonstrate/report pain at worst less than or equal to 2/10 to facilitate minimal limitation in daily activity secondary to pain symptoms. Goal status: on going 05/09/2023   2. Patient will demonstrate independent use of home exercise program to facilitate ability to maintain/progress functional gains from skilled physical therapy services. Goal status: on going 05/09/2023   3. Patient will demonstrate FOTO outcome > or = 67 % to indicate reduced disability due to condition. Goal status: on going 05/09/2023   4.  Patient will demonstrate cervical AROM WFL s symptoms to facilitate usual head movements for daily activity including driving, self care.   Goal status: on going 05/09/2023  5.  Patient will demonstrate/report ability to sleep s restriction due to symptoms.   Goal status: on going 05/09/2023   6.  Patient will demonstrate/report ability to perform yard/garden activity at [facility].  Goal status: on going 05/09/2023     PLAN:  PT FREQUENCY:  1-2x/week  PT DURATION: 10 weeks  PLANNED INTERVENTIONS: Therapeutic exercises, Therapeutic activity, Neuro Muscular re-education, Balance training, Gait training, Patient/Family education, Joint mobilization, Stair training, DME instructions, Dry Needling, Electrical stimulation, Cryotherapy, vasopneumatic device,Traction, Moist heat, Taping, Ultrasound, Ionotophoresis 4mg /ml Dexamethasone, and aquatic therapy, Manual therapy.  All included unless contraindicated  PLAN FOR NEXT SESSION:   Manual for joint mobility improvements . DN as desired.    Chyrel Masson, PT, DPT, OCS, ATC 05/09/23  3:46 PM

## 2023-05-11 ENCOUNTER — Ambulatory Visit: Payer: Medicare Other | Admitting: Physical Therapy

## 2023-05-11 ENCOUNTER — Encounter: Payer: Self-pay | Admitting: Physical Therapy

## 2023-05-11 DIAGNOSIS — M542 Cervicalgia: Secondary | ICD-10-CM | POA: Diagnosis not present

## 2023-05-11 DIAGNOSIS — R293 Abnormal posture: Secondary | ICD-10-CM | POA: Diagnosis not present

## 2023-05-11 NOTE — Therapy (Signed)
OUTPATIENT PHYSICAL THERAPY TREATMENT   Patient Name: Brooke Cantrell MRN: [id number] DOB:05/14/1946, 77 y.o., female Today's Date: 05/11/2023  END OF SESSION:  PT End of Session - 05/11/23 0925     Visit Number 6    Number of Visits 20    Date for PT Re-Evaluation 06/27/23    Authorization Type Medicare [name]    PT Start Time 0925    PT Stop Time 1004    PT Time Calculation (min) 39 min    Activity Tolerance Patient tolerated treatment well    Behavior During Therapy WFL for tasks assessed/performed                  Past Medical History:  Diagnosis Date   Arthritis    Breast cancer (HCC)    Left breast   Depression    "mild", no meds   History of kidney stones    Past Surgical History:  Procedure Laterality Date   breast cancer surgery     BREAST ENHANCEMENT SURGERY     BREAST LUMPECTOMY Left    CATARACT EXTRACTION, BILATERAL     ECTOPIC PREGNANCY SURGERY     EYE SURGERY     INNER EAR SURGERY     TONSILLECTOMY     TOTAL SHOULDER ARTHROPLASTY Left 04/25/2018   TOTAL SHOULDER ARTHROPLASTY Left 04/25/2018   Procedure: LEFT TOTAL SHOULDER REPLACEMENT;  Surgeon: Cammy Copa, MD;  Location: [facility] OR;  Service: Orthopedics;  Laterality: Left;   TUBAL LIGATION     Patient Active Problem List   Diagnosis Date Noted   Weakness    Hyperlipidemia    Acute respiratory failure due to COVID-19 (HCC) 06/10/2020   Pneumonia due to COVID-19 virus 06/10/2020   Hx of multiple pulmonary nodules 06/10/2020   Acute respiratory failure with hypoxia (HCC)    Lung nodule seen on imaging study 01/08/2020   History of breast cancer 04/14/2019   Sepsis (HCC) 04/02/2019   CAP (community acquired pneumonia) 04/02/2019   Elevated troponin 04/02/2019   Shoulder arthritis 04/25/2018   Primary osteoarthritis, 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   Chronic left shoulder pain 05/17/2017   Cervical disc disorder with radiculopathy 08/14/2016   Chronic infection of sinus 08/31/2015   Cough  08/31/2015   Allergic reaction 08/31/2015    PCP: Marisue Ivan , MD  REFERRING PROVIDER: Kathryne Hitch, MD  REFERRING DIAG: M54.2 (ICD-10-CM) - Cervicalgia  THERAPY DIAG:  Cervicalgia  Abnormal posture  Rationale for Evaluation and Treatment: Rehabilitation  ONSET DATE: Nov 2023  SUBJECTIVE:  SUBJECTIVE STATEMENT: More sore and aching today; not sharp pain.   PERTINENT HISTORY:  History of breast cancer, kidney stones, arthritis, Lt TSA  PAIN:  NPRS scale: 3/10 upon arrival.  Pain location: cervical Rt Pain description: stiffness/tightness, ache Aggravating factors: waking up stuff, using hand tools in gardening, bending forward, head turns limited in driving Relieving factors: aleve   PRECAUTIONS: None  WEIGHT BEARING RESTRICTIONS: No  FALLS:  Has patient fallen in last 6 months? No  LIVING ENVIRONMENT: Lives in: House/apartment  PLOF: Independent, gardening, Rt hand dominant   PATIENT GOALS: Reduce pain, get back to activity  OBJECTIVE:   PATIENT SURVEYS:  04/18/2023 FOTO intake:  64  predicted:  67  COGNITION: 04/18/2023 Overall cognitive status: Within functional limits for tasks assessed  SENSATION: 04/18/2023 [facility]  POSTURE:  04/18/2023 Head positioning to Lt into lateral flexion, rounded shoulders.   PALPATION: 04/18/2023 Tenderness, trigger points in Rt upper trap, cervical paraspinals, Rt infraspinatus.  CERVICAL ROM:   ROM AROM (deg) 04/18/2023 AROM 04/25/2023 05/02/2023 AROM 05/09/2023 AROM  Flexion 40 c Lt  pain  55  50  Extension 42 c Rt pain 48  55  Right lateral flexion      Left lateral flexion      Right rotation 30  30 (55 deg) 45 (post manual 55) 55  Left rotation 38 40 38 (post manual 48) 50   (Blank rows = not  tested)  UPPER EXTREMITY ROM:   ROM Right 04/18/2023 Left 04/18/2023  Shoulder flexion    Shoulder extension    Shoulder abduction    Shoulder adduction    Shoulder extension    Shoulder internal rotation    Shoulder external rotation    Elbow flexion    Elbow extension    Wrist flexion    Wrist extension    Wrist ulnar deviation    Wrist radial deviation    Wrist pronation    Wrist supination     (Blank rows = not tested)  UPPER EXTREMITY MMT:  MMT Right 04/18/2023 Left 04/18/2023  Shoulder flexion 5/5 5/5  Shoulder extension    Shoulder abduction 5/5 5/5  Shoulder adduction    Shoulder extension    Shoulder internal rotation 5/5 4/5  Shoulder external rotation 5/5 5/5  Middle trapezius    Lower trapezius    Elbow flexion 5/5 5/5  Elbow extension 5/5 5/5   (Blank rows = not tested)  CERVICAL SPECIAL TESTS:  04/18/2023 Cervical translatory glides limited mild to moderate throughout cervical region.   FUNCTIONAL TESTS:  04/18/2023 No testing  TODAY'S TREATMENT:                                                                                                       DATE:  05/11/2023 Therex: Tband rows blue band x 20 Tband GH ext blue band 3 sec hold x 20  Green band bilateral shoulder ER c scapular retraction 2 x 10  Standing horizontal abduction L3 band 2x10 Towel assisted cervical rotation x 10 bilateral  Supine cervical retraction x 10 reps Supine cervical rotation 10 x 5 sec hold; bil Supine alternating isometric side bending 10 x 5 sec hold bil Supine shoulder ER with scapular retraction and chin tuck x 10 reps; L3 band Supine overhead pull with L3 band x 10 reps   Modalities Moist heat to cervical spine with supine exercises      TODAY'S TREATMENT:                                                                                                        DATE:  05/09/2023 Therex: Tband rows blue band x 20 Tband GH ext blue band 3 sec hold x 20  Green band bilateral shoulder ER c scapular retraction 2 x 10  Towel assisted cervical rotation x 10 bilateral   Manual: STM with compression to Rt upper trap.  Prone cPA T1-T5 g3.  uPA C5, C6, C7 g3 bilaterally  Trigger Point Dry-Needling  Treatment instructions: Expect mild to moderate muscle soreness. S/S of pneumothorax if dry needled over a lung field, and to seek immediate medical attention should they occur. Patient verbalized understanding of these instructions and education.  Patient Consent Given: Yes Education handout provided: Previously provided Muscles treated: Rt upper trap, Rt suboccipitals Treatment response/outcome: twitch responses with reduction in symptoms following  TODAY'S TREATMENT:                                                                                                       DATE:  05/02/2023 Therex: UBE L2 x 6 min (3 min each direction) Cervical side bending x10 bil AROM Towel assisted cervical rotation x 10 bilateral pre manual x 10 post manual each side   Manual: STM with compression to Rt upper trap.  Prone  cPA T1-T5 g4.  uPA C5, C6, C7 g3 bilaterally  Trigger Point Dry-Needling  Treatment instructions: Expect mild to moderate muscle soreness. S/S of pneumothorax if dry needled over a lung field, and to seek immediate medical attention should they occur. Patient verbalized understanding of these instructions and education.  Patient Consent Given: Yes Education handout provided: Previously provided Muscles treated: Rt upper trap, Rt suboccipitals Treatment response/outcome: twitch responses with reduction in symptoms following  TODAY'S TREATMENT:                                                                                                       DATE:  04/30/2023 Therex: UBE L2 x 6 min (3 min  each direction) Bil ER with scapular retraction 2x10; L3 Rows L3 band 2x10 Shoulder extension L3 band 2x10 Y to W back 2x10  Cervical side bending x10 bil AROM  Manual: STM with compression to Rt upper trap; cervical paraspinals and suboccipitals  Trigger Point Dry-Needling  Treatment instructions: Expect mild to moderate muscle soreness. S/S of pneumothorax if dry needled over a lung field, and to seek immediate medical attention should they occur. Patient verbalized understanding of these instructions and education.  Patient Consent Given: Yes Education handout provided: Previously provided Muscles treated: Rt upper trap, Rt suboccipitals Treatment response/outcome: twitch responses with reduction in symptoms following   PATIENT EDUCATION:  Education details: HEP, POC Person educated: Patient Education method: Programmer, multimedia, Demonstration, Verbal cues, and Handouts Education comprehension: verbalized understanding, returned demonstration, and verbal cues required  HOME EXERCISE PROGRAM: Access Code: [email or web address] URL: [email or web address] Date: 04/18/2023 Prepared by: Chyrel Masson  Exercises - Supine Cervical Retraction with Towel  - 1-2 x daily - 7 x weekly - 1 sets - 10 reps - 5 hold - Cervical Retraction at Wall  - 2 x daily - 7 x weekly - 1 sets - 10 reps - 5 hold - Supine Cervical Rotation AROM on Pillow  - 1-2 x daily - 7 x weekly - 3 sets - 10 reps - Supine Scapular Retraction  - 1-2 x daily - 7 x weekly - 1 sets - 10 reps - 5 hold - Seated Scapular Retraction  - 1-2 x daily - 7 x weekly - 1 sets - 10 reps - 3-5 hold - Seated Assisted Cervical Rotation with Towel  - 2-3 x daily - 7 x weekly - 1 sets - 10 reps - 2-3 hold  ASSESSMENT:  CLINICAL IMPRESSION: Pt tolerated session well with reduction in tightness reported after session.  Will continue to benefit from PT to maximize function.  OBJECTIVE IMPAIRMENTS: decreased activity tolerance, decreased  endurance, decreased mobility, decreased ROM, decreased strength, hypomobility, increased fascial restrictions, impaired perceived functional ability, increased muscle spasms, impaired flexibility, impaired UE functional use, improper body mechanics, postural dysfunction, and pain.   ACTIVITY LIMITATIONS: carrying, lifting, bending, sitting, sleeping, dressing, reach over head, and hygiene/grooming  PARTICIPATION LIMITATIONS: cleaning, laundry, driving, community activity, and yard work  PERSONAL FACTORS: Time since onset of injury/illness/exacerbation and History of breast cancer, kidney stones, arthritis, Lt TSA  are also affecting  patient's functional outcome.   REHAB POTENTIAL: Good  CLINICAL DECISION MAKING: Stable/uncomplicated  EVALUATION COMPLEXITY: Low   GOALS: Goals reviewed with patient? Yes  SHORT TERM GOALS: (target date for Short term goals are 3 weeks 05/09/2023)  1.Patient will demonstrate independent use of home exercise program to maintain progress from in clinic treatments. Goal status: met  LONG TERM GOALS: (target dates for all long term goals are 10 weeks  06/27/2023 )   1. Patient will demonstrate/report pain at worst less than or equal to 2/10 to facilitate minimal limitation in daily activity secondary to pain symptoms. Goal status: on going 05/09/2023   2. Patient will demonstrate independent use of home exercise program to facilitate ability to maintain/progress functional gains from skilled physical therapy services. Goal status: on going 05/09/2023   3. Patient will demonstrate FOTO outcome > or = 67 % to indicate reduced disability due to condition. Goal status: on going 05/09/2023   4.  Patient will demonstrate cervical AROM WFL s symptoms to facilitate usual head movements for daily activity including driving, self care.   Goal status: on going 05/09/2023   5.  Patient will demonstrate/report ability to sleep s restriction due to symptoms.   Goal  status: on going 05/09/2023   6.  Patient will demonstrate/report ability to perform yard/garden activity at [facility].  Goal status: on going 05/09/2023     PLAN:  PT FREQUENCY: 1-2x/week  PT DURATION: 10 weeks  PLANNED INTERVENTIONS: Therapeutic exercises, Therapeutic activity, Neuro Muscular re-education, Balance training, Gait training, Patient/Family education, Joint mobilization, Stair training, DME instructions, Dry Needling, Electrical stimulation, Cryotherapy, vasopneumatic device,Traction, Moist heat, Taping, Ultrasound, Ionotophoresis 4mg /ml Dexamethasone, and aquatic therapy, Manual therapy.  All included unless contraindicated  PLAN FOR NEXT SESSION:  only has appts scheduled next week, discuss continuation or d/c,  Manual for joint mobility improvements . DN as desired.     Clarita Crane, PT, DPT 05/11/23 10:05 AM

## 2023-05-15 ENCOUNTER — Ambulatory Visit (INDEPENDENT_AMBULATORY_CARE_PROVIDER_SITE_OTHER): Payer: Medicare Other | Admitting: Rehabilitative and Restorative Service Providers"

## 2023-05-15 ENCOUNTER — Encounter: Payer: Self-pay | Admitting: Rehabilitative and Restorative Service Providers"

## 2023-05-15 DIAGNOSIS — M542 Cervicalgia: Secondary | ICD-10-CM | POA: Diagnosis not present

## 2023-05-15 DIAGNOSIS — R293 Abnormal posture: Secondary | ICD-10-CM

## 2023-05-15 NOTE — Therapy (Signed)
OUTPATIENT PHYSICAL THERAPY TREATMENT   Patient Name: Brooke Cantrell MRN: [id number] DOB:[birth year], 77 y.o., female Today's Date: 05/15/2023  END OF SESSION:  PT End of Session - 05/15/23 1333     Visit Number 7    Number of Visits 20    Date for PT Re-Evaluation 06/27/23    Authorization Type Medicare [name]    PT Start Time 1342    PT Stop Time 1420    PT Time Calculation (min) 38 min    Activity Tolerance Patient tolerated treatment well    Behavior During Therapy WFL for tasks assessed/performed               Past Medical History:  Diagnosis Date   Arthritis    Breast cancer (HCC)    Left breast   Depression    "mild", no meds   History of kidney stones    Past Surgical History:  Procedure Laterality Date   breast cancer surgery     BREAST ENHANCEMENT SURGERY     BREAST LUMPECTOMY Left    CATARACT EXTRACTION, BILATERAL     ECTOPIC PREGNANCY SURGERY     EYE SURGERY     INNER EAR SURGERY     TONSILLECTOMY     TOTAL SHOULDER ARTHROPLASTY Left 04/25/2018   TOTAL SHOULDER ARTHROPLASTY Left 04/25/2018   Procedure: LEFT TOTAL SHOULDER REPLACEMENT;  Surgeon: Cammy Copa, MD;  Location: [facility] OR;  Service: Orthopedics;  Laterality: Left;   TUBAL LIGATION     Patient Active Problem List   Diagnosis Date Noted   Weakness    Hyperlipidemia    Acute respiratory failure due to COVID-19 (HCC) 06/10/2020   Pneumonia due to COVID-19 virus 06/10/2020   Hx of multiple pulmonary nodules 06/10/2020   Acute respiratory failure with hypoxia (HCC)    Lung nodule seen on imaging study 01/08/2020   History of breast cancer 04/14/2019   Sepsis (HCC) 04/02/2019   CAP (community acquired pneumonia) 04/02/2019   Elevated troponin 04/02/2019   Shoulder arthritis 04/25/2018   Primary osteoarthritis, 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   Chronic left shoulder pain 05/17/2017   Cervical disc disorder with radiculopathy 08/14/2016   Chronic infection of sinus 08/31/2015   Cough  08/31/2015   Allergic reaction 08/31/2015    PCP: Marisue Ivan , MD  REFERRING PROVIDER: Kathryne Hitch, MD  REFERRING DIAG: M54.2 (ICD-10-CM) - Cervicalgia  THERAPY DIAG:  Cervicalgia  Abnormal posture  Rationale for Evaluation and Treatment: Rehabilitation  ONSET DATE: Nov 2023  SUBJECTIVE:  SUBJECTIVE STATEMENT: Pt indicated no worse, a little better overall each time.   Reported pain with movement today mild.    PERTINENT HISTORY:  History of breast cancer, kidney stones, arthritis, Lt TSA  PAIN:  NPRS scale: 3/10 upon arrival.  Pain location: cervical Rt Pain description: stiffness/tightness, ache Aggravating factors: waking up stuff, using hand tools in gardening, bending forward, head turns limited in driving Relieving factors: aleve   PRECAUTIONS: None  WEIGHT BEARING RESTRICTIONS: No  FALLS:  Has patient fallen in last 6 months? No  LIVING ENVIRONMENT: Lives in: House/apartment  PLOF: Independent, gardening, Rt hand dominant   PATIENT GOALS: Reduce pain, get back to activity  OBJECTIVE:   PATIENT SURVEYS:  05/15/2023:  FOTO update:  61  04/18/2023 FOTO intake:  64  predicted:  67  COGNITION: 04/18/2023 Overall cognitive status: Within functional limits for tasks assessed  SENSATION: 04/18/2023 [facility]  POSTURE:  04/18/2023 Head positioning to Lt into lateral flexion, rounded shoulders.   PALPATION: 04/18/2023 Tenderness, trigger points in Rt upper trap, cervical paraspinals, Rt infraspinatus.  CERVICAL ROM:   ROM AROM (deg) 04/18/2023 AROM 04/25/2023 05/02/2023 AROM 05/09/2023 AROM  Flexion 40 c Lt  pain  55  50  Extension 42 c Rt pain 48  55  Right lateral flexion      Left lateral flexion      Right rotation 30  30 (55 deg) 45 (post  manual 55) 55  Left rotation 38 40 38 (post manual 48) 50   (Blank rows = not tested)  UPPER EXTREMITY ROM:   ROM Right 04/18/2023 Left 04/18/2023  Shoulder flexion    Shoulder extension    Shoulder abduction    Shoulder adduction    Shoulder extension    Shoulder internal rotation    Shoulder external rotation    Elbow flexion    Elbow extension    Wrist flexion    Wrist extension    Wrist ulnar deviation    Wrist radial deviation    Wrist pronation    Wrist supination     (Blank rows = not tested)  UPPER EXTREMITY MMT:  MMT Right 04/18/2023 Left 04/18/2023  Shoulder flexion 5/5 5/5  Shoulder extension    Shoulder abduction 5/5 5/5  Shoulder adduction    Shoulder extension    Shoulder internal rotation 5/5 4/5  Shoulder external rotation 5/5 5/5  Middle trapezius    Lower trapezius    Elbow flexion 5/5 5/5  Elbow extension 5/5 5/5   (Blank rows = not tested)  CERVICAL SPECIAL TESTS:  04/18/2023 Cervical translatory glides limited mild to moderate throughout cervical region.   FUNCTIONAL TESTS:  04/18/2023 No testing  TODAY'S TREATMENT:                                                                                                       DATE:  05/15/2023 Manual: Prone cPA T1-T5 g3.  uPA C5, C6, C7 g3 bilaterally  Therex: Towel assisted cervical rotation x 15 bilateral  Seated green band horizontal abduction 2 x 15  Standing rows green band x 20 Standing gh ext  green band x 20  Seated thoracic extension over chair x 15 2-3 sec hold  Verbal review of existing HEP with handout provided with additions.      TODAY'S TREATMENT:                                                                                                       DATE:  05/11/2023 Therex: Tband rows blue band x 20 Tband GH ext  blue band 3 sec hold x 20  Green band bilateral shoulder ER c scapular retraction 2 x 10  Standing horizontal abduction L3 band 2x10 Towel assisted cervical rotation x 10 bilateral  Supine cervical retraction x 10 reps Supine cervical rotation 10 x 5 sec hold; bil Supine alternating isometric side bending 10 x 5 sec hold bil Supine shoulder ER with scapular retraction and chin tuck x 10 reps; L3 band Supine overhead pull with L3 band x 10 reps   Modalities Moist heat to cervical spine with supine exercises  TODAY'S TREATMENT:                                                                                                       DATE:  05/09/2023 Therex: Tband rows blue band x 20 Tband GH ext blue band 3 sec hold x 20  Green band bilateral shoulder ER c scapular retraction 2 x 10  Towel assisted cervical rotation x 10 bilateral   Manual: STM with compression to Rt upper trap.  Prone cPA T1-T5 g3.  uPA C5, C6, C7 g3 bilaterally  Trigger Point Dry-Needling  Treatment instructions: Expect mild to moderate muscle soreness. S/S of pneumothorax if dry needled over a lung field, and to seek immediate medical attention should they occur. Patient verbalized understanding of these instructions and education.  Patient  Consent Given: Yes Education handout provided: Previously provided Muscles treated: Rt upper trap, Rt suboccipitals Treatment response/outcome: twitch responses with reduction in symptoms following  TODAY'S TREATMENT:                                                                                                       DATE:  05/02/2023 Therex: UBE L2 x 6 min (3 min each direction) Cervical side bending x10 bil AROM Towel assisted cervical rotation x 10 bilateral pre manual x 10 post manual each side   Manual: STM with compression to Rt upper trap.  Prone cPA T1-T5 g4.  uPA C5, C6, C7 g3 bilaterally  Trigger Point Dry-Needling  Treatment instructions: Expect mild to moderate  muscle soreness. S/S of pneumothorax if dry needled over a lung field, and to seek immediate medical attention should they occur. Patient verbalized understanding of these instructions and education.  Patient Consent Given: Yes Education handout provided: Previously provided Muscles treated: Rt upper trap, Rt suboccipitals Treatment response/outcome: twitch responses with reduction in symptoms following   PATIENT EDUCATION:  05/15/2023:  Education details: HEP update Person educated: Patient Education method: Programmer, multimedia, Demonstration, Verbal cues, and Handouts Education comprehension: verbalized understanding, returned demonstration, and verbal cues required  HOME EXERCISE PROGRAM: Access Code: [email or web address] URL: [email or web address] Date: 05/15/2023 Prepared by: Chyrel Masson  Exercises - Supine Cervical Retraction with Towel  - 1-2 x daily - 7 x weekly - 1 sets - 10 reps - 5 hold - Cervical Retraction at Wall  - 2 x daily - 7 x weekly - 1 sets - 10 reps - 5 hold - Supine Cervical Rotation AROM on Pillow  - 1-2 x daily - 7 x weekly - 3 sets - 10 reps - Supine Scapular Retraction  - 1-2 x daily - 7 x weekly - 1 sets - 10 reps - 5 hold - Seated Scapular Retraction  - 1-2 x daily - 7 x weekly - 1 sets - 10 reps - 3-5 hold - Seated Assisted Cervical Rotation with Towel  - 2-3 x daily - 7 x weekly - 1 sets - 10 reps - 2-3 hold - Standing Shoulder Row with Anchored Resistance  - 1-2 x daily - 7 x weekly - 1-2 sets - 10-15 reps - Shoulder Extension with Resistance  - 1-2 x daily - 7 x weekly - 1-2 sets - 10-15 reps - Standing Shoulder Horizontal Abduction with Resistance  - 1-2 x daily - 7 x weekly - 1-2 sets - 10-15 reps  ASSESSMENT:  CLINICAL IMPRESSION: FOTO review showed relatively unchanged despite Pt reporting of improvements.  Reviewed showed impact of lifting 25 lbs and playing recreational sports being slightly worse today than eval but improvement in cervical turning  of head noted.   Overall improvement in cervical mobility noted.  Due to improvements, Pt was in agreement with longer duration between visits to reassess response with HEP transitioning option.   OBJECTIVE IMPAIRMENTS: decreased activity tolerance, decreased endurance, decreased mobility, decreased ROM, decreased strength, hypomobility, increased fascial restrictions, impaired perceived functional ability, increased muscle spasms, impaired  flexibility, impaired UE functional use, improper body mechanics, postural dysfunction, and pain.   ACTIVITY LIMITATIONS: carrying, lifting, bending, sitting, sleeping, dressing, reach over head, and hygiene/grooming  PARTICIPATION LIMITATIONS: cleaning, laundry, driving, community activity, and yard work  PERSONAL FACTORS: Time since onset of injury/illness/exacerbation and History of breast cancer, kidney stones, arthritis, Lt TSA  are also affecting patient's functional outcome.   REHAB POTENTIAL: Good  CLINICAL DECISION MAKING: Stable/uncomplicated  EVALUATION COMPLEXITY: Low   GOALS: Goals reviewed with patient? Yes  SHORT TERM GOALS: (target date for Short term goals are 3 weeks 05/09/2023)  1.Patient will demonstrate independent use of home exercise program to maintain progress from in clinic treatments. Goal status: met  LONG TERM GOALS: (target dates for all long term goals are 10 weeks  06/27/2023 )   1. Patient will demonstrate/report pain at worst less than or equal to 2/10 to facilitate minimal limitation in daily activity secondary to pain symptoms. Goal status: on going 05/09/2023   2. Patient will demonstrate independent use of home exercise program to facilitate ability to maintain/progress functional gains from skilled physical therapy services. Goal status: on going 05/09/2023   3. Patient will demonstrate FOTO outcome > or = 67 % to indicate reduced disability due to condition. Goal status: on going 05/09/2023   4.  Patient will  demonstrate cervical AROM WFL s symptoms to facilitate usual head movements for daily activity including driving, self care.   Goal status: on going 05/09/2023   5.  Patient will demonstrate/report ability to sleep s restriction due to symptoms.   Goal status: on going 05/09/2023   6.  Patient will demonstrate/report ability to perform yard/garden activity at [facility].  Goal status: on going 05/09/2023     PLAN:  PT FREQUENCY: 1-2x/week  PT DURATION: 10 weeks  PLANNED INTERVENTIONS: Therapeutic exercises, Therapeutic activity, Neuro Muscular re-education, Balance training, Gait training, Patient/Family education, Joint mobilization, Stair training, DME instructions, Dry Needling, Electrical stimulation, Cryotherapy, vasopneumatic device,Traction, Moist heat, Taping, Ultrasound, Ionotophoresis 4mg /ml Dexamethasone, and aquatic therapy, Manual therapy.  All included unless contraindicated  PLAN FOR NEXT SESSION:  Follow up on week between visits for check about HEP transitioning.  FOTO reassessment if symptoms continue to improve.   Chyrel Masson, PT, DPT, OCS, ATC 05/15/23  2:18 PM

## 2023-05-17 ENCOUNTER — Encounter: Payer: Medicare Other | Admitting: Rehabilitative and Restorative Service Providers"

## 2023-05-28 ENCOUNTER — Encounter: Payer: Self-pay | Admitting: Rehabilitative and Restorative Service Providers"

## 2023-05-28 ENCOUNTER — Ambulatory Visit (INDEPENDENT_AMBULATORY_CARE_PROVIDER_SITE_OTHER): Payer: Medicare Other | Admitting: Rehabilitative and Restorative Service Providers"

## 2023-05-28 DIAGNOSIS — R293 Abnormal posture: Secondary | ICD-10-CM | POA: Diagnosis not present

## 2023-05-28 DIAGNOSIS — M542 Cervicalgia: Secondary | ICD-10-CM | POA: Diagnosis not present

## 2023-05-28 NOTE — Therapy (Addendum)
OUTPATIENT PHYSICAL THERAPY TREATMENT / DISCHARGE   Patient Name: Brooke Cantrell MRN: [id number] DOB:1946-04-16, 77 y.o., female Today's Date: 05/28/2023  END OF SESSION:  PT End of Session - 05/28/23 1525     Visit Number 8    Number of Visits 20    Date for PT Re-Evaluation 06/27/23    Authorization Type Medicare [name]    PT Start Time 1523    PT Stop Time 1552    PT Time Calculation (min) 29 min    Activity Tolerance Patient tolerated treatment well    Behavior During Therapy WFL for tasks assessed/performed                Past Medical History:  Diagnosis Date   Arthritis    Breast cancer (HCC)    Left breast   Depression    "mild", no meds   History of kidney stones    Past Surgical History:  Procedure Laterality Date   breast cancer surgery     BREAST ENHANCEMENT SURGERY     BREAST LUMPECTOMY Left    CATARACT EXTRACTION, BILATERAL     ECTOPIC PREGNANCY SURGERY     EYE SURGERY     INNER EAR SURGERY     TONSILLECTOMY     TOTAL SHOULDER ARTHROPLASTY Left 04/25/2018   TOTAL SHOULDER ARTHROPLASTY Left 04/25/2018   Procedure: LEFT TOTAL SHOULDER REPLACEMENT;  Surgeon: Cammy Copa, MD;  Location: [facility] OR;  Service: Orthopedics;  Laterality: Left;   TUBAL LIGATION     Patient Active Problem List   Diagnosis Date Noted   Weakness    Hyperlipidemia    Acute respiratory failure due to COVID-19 (HCC) 06/10/2020   Pneumonia due to COVID-19 virus 06/10/2020   Hx of multiple pulmonary nodules 06/10/2020   Acute respiratory failure with hypoxia (HCC)    Lung nodule seen on imaging study 01/08/2020   History of breast cancer 04/14/2019   Sepsis (HCC) 04/02/2019   CAP (community acquired pneumonia) 04/02/2019   Elevated troponin 04/02/2019   Shoulder arthritis 04/25/2018   Primary osteoarthritis, 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   Chronic left shoulder pain 05/17/2017   Cervical disc disorder with radiculopathy 08/14/2016   Chronic infection of sinus 08/31/2015    Cough 08/31/2015   Allergic reaction 08/31/2015    PCP: Marisue Ivan , MD  REFERRING PROVIDER: Kathryne Hitch, MD  REFERRING DIAG: M54.2 (ICD-10-CM) - Cervicalgia  THERAPY DIAG:  Cervicalgia  Abnormal posture  Rationale for Evaluation and Treatment: Rehabilitation  ONSET DATE: Nov 2023  SUBJECTIVE:  SUBJECTIVE STATEMENT: Pt indicated feeling stiffness and ache noted over weekend.  Could blame weather.  Also mentioned getting stung by hornets.   Pt indicated feeling some worse at times vs when coming more routinely.     PERTINENT HISTORY:  History of breast cancer, kidney stones, arthritis, Lt TSA  PAIN:  NPRS scale:  upon arrival : 3/10 Pain location: cervical Rt Pain description: stiffness/tightness, ache Aggravating factors: waking up stuff, using hand tools in gardening, bending forward, head turns limited in driving Relieving factors: aleve   PRECAUTIONS: None  WEIGHT BEARING RESTRICTIONS: No  FALLS:  Has patient fallen in last 6 months? No  LIVING ENVIRONMENT: Lives in: House/apartment  PLOF: Independent, gardening, Rt hand dominant   PATIENT GOALS: Reduce pain, get back to activity  OBJECTIVE:   PATIENT SURVEYS:  05/15/2023:  FOTO update:  61  04/18/2023 FOTO intake:  64  predicted:  67  COGNITION: 04/18/2023 Overall cognitive status: Within functional limits for tasks assessed  SENSATION: 04/18/2023 [facility]  POSTURE:  04/18/2023 Head positioning to Lt into lateral flexion, rounded shoulders.   PALPATION: 04/18/2023 Tenderness, trigger points in Rt upper trap, cervical paraspinals, Rt infraspinatus.  CERVICAL ROM:   ROM AROM (deg) 04/18/2023 AROM 04/25/2023 05/02/2023 AROM 05/09/2023 AROM 05/28/2023 AROM  Flexion 40 c Lt  pain  55  50 40   Extension 42 c Rt pain 48  55 50  Right lateral flexion       Left lateral flexion       Right rotation 30  30 (55 deg) 45 (post manual 55) 55 48  Left rotation 38 40 38 (post manual 48) 50 32   (Blank rows = not tested)  UPPER EXTREMITY ROM:   ROM Right 04/18/2023 Left 04/18/2023  Shoulder flexion    Shoulder extension    Shoulder abduction    Shoulder adduction    Shoulder extension    Shoulder internal rotation    Shoulder external rotation    Elbow flexion    Elbow extension    Wrist flexion    Wrist extension    Wrist ulnar deviation    Wrist radial deviation    Wrist pronation    Wrist supination     (Blank rows = not tested)  UPPER EXTREMITY MMT:  MMT Right 04/18/2023 Left 04/18/2023  Shoulder flexion 5/5 5/5  Shoulder extension    Shoulder abduction 5/5 5/5  Shoulder adduction    Shoulder extension    Shoulder internal rotation 5/5 4/5  Shoulder external rotation 5/5 5/5  Middle trapezius    Lower trapezius    Elbow flexion 5/5 5/5  Elbow extension 5/5 5/5   (Blank rows = not tested)  CERVICAL SPECIAL TESTS:  04/18/2023 Cervical translatory glides limited mild to moderate throughout cervical region.   FUNCTIONAL TESTS:  04/18/2023 No testing  TODAY'S TREATMENT:                                                                                                       DATE:  05/28/2023 Manual: Prone cPA T1-T5 g3.  uPA C5, C6, C7 g3 bilaterally.  Supine cervical downslope mobs g3 Lt and Rt middle to lower.   Therex: Cervical rotation x 10   Verbal review of existing HEP with handout provided with additions.   TODAY'S TREATMENT:                                                                                                       DATE:  05/15/2023 Manual: Prone cPA T1-T5 g3.  uPA C5, C6, C7  g3 bilaterally  Therex: Towel assisted cervical rotation x 15 bilateral  Seated green band horizontal abduction 2 x 15  Standing rows green band x 20 Standing gh ext  green band x 20  Seated thoracic extension over chair x 15 2-3 sec hold  Verbal review of existing HEP with handout provided with additions.      TODAY'S TREATMENT:                                                                                                       DATE:  05/11/2023 Therex: Tband rows blue band x 20 Tband GH ext blue band 3 sec hold x 20  Green band bilateral shoulder ER c scapular retraction 2 x 10  Standing horizontal abduction L3 band 2x10 Towel assisted cervical rotation x 10 bilateral  Supine cervical retraction x 10 reps Supine cervical rotation 10 x 5 sec hold; bil Supine alternating isometric side bending 10 x 5 sec hold bil Supine shoulder ER with scapular retraction and chin tuck x 10 reps; L3 band Supine overhead pull with L3 band x 10 reps   Modalities Moist heat to cervical spine with supine exercises  TODAY'S TREATMENT:  DATE:  05/09/2023 Therex: Tband rows blue band x 20 Tband GH ext blue band 3 sec hold x 20  Green band bilateral shoulder ER c scapular retraction 2 x 10  Towel assisted cervical rotation x 10 bilateral   Manual: STM with compression to Rt upper trap.  Prone cPA T1-T5 g3.  uPA C5, C6, C7 g3 bilaterally  Trigger Point Dry-Needling  Treatment instructions: Expect mild to moderate muscle soreness. S/S of pneumothorax if dry needled over a lung field, and to seek immediate medical attention should they occur. Patient verbalized understanding of these instructions and education.  Patient Consent Given: Yes Education handout provided: Previously provided Muscles treated: Rt upper trap, Rt suboccipitals Treatment response/outcome: twitch responses with reduction in  symptoms following    PATIENT EDUCATION:  05/15/2023:  Education details: HEP update Person educated: Patient Education method: Programmer, multimedia, Demonstration, Verbal cues, and Handouts Education comprehension: verbalized understanding, returned demonstration, and verbal cues required  HOME EXERCISE PROGRAM: Access Code: [email or web address] URL: [email or web address] Date: 05/15/2023 Prepared by: Chyrel Masson  Exercises - Supine Cervical Retraction with Towel  - 1-2 x daily - 7 x weekly - 1 sets - 10 reps - 5 hold - Cervical Retraction at Wall  - 2 x daily - 7 x weekly - 1 sets - 10 reps - 5 hold - Supine Cervical Rotation AROM on Pillow  - 1-2 x daily - 7 x weekly - 3 sets - 10 reps - Supine Scapular Retraction  - 1-2 x daily - 7 x weekly - 1 sets - 10 reps - 5 hold - Seated Scapular Retraction  - 1-2 x daily - 7 x weekly - 1 sets - 10 reps - 3-5 hold - Seated Assisted Cervical Rotation with Towel  - 2-3 x daily - 7 x weekly - 1 sets - 10 reps - 2-3 hold - Standing Shoulder Row with Anchored Resistance  - 1-2 x daily - 7 x weekly - 1-2 sets - 10-15 reps - Shoulder Extension with Resistance  - 1-2 x daily - 7 x weekly - 1-2 sets - 10-15 reps - Standing Shoulder Horizontal Abduction with Resistance  - 1-2 x daily - 7 x weekly - 1-2 sets - 10-15 reps  ASSESSMENT:  CLINICAL IMPRESSION: Joint mobility and general cervical mobility still limited compared to [facility] amounts with symptoms noted.  Some symptoms worse at times with weather changes, some with increased stiffness.  Pt may benefit from skilled PT services.  Decreased time time due in part to arrival time.   OBJECTIVE IMPAIRMENTS: decreased activity tolerance, decreased endurance, decreased mobility, decreased ROM, decreased strength, hypomobility, increased fascial restrictions, impaired perceived functional ability, increased muscle spasms, impaired flexibility, impaired UE functional use, improper body mechanics, postural  dysfunction, and pain.   ACTIVITY LIMITATIONS: carrying, lifting, bending, sitting, sleeping, dressing, reach over head, and hygiene/grooming  PARTICIPATION LIMITATIONS: cleaning, laundry, driving, community activity, and yard work  PERSONAL FACTORS: Time since onset of injury/illness/exacerbation and History of breast cancer, kidney stones, arthritis, Lt TSA  are also affecting patient's functional outcome.   REHAB POTENTIAL: Good  CLINICAL DECISION MAKING: Stable/uncomplicated  EVALUATION COMPLEXITY: Low   GOALS: Goals reviewed with patient? Yes  SHORT TERM GOALS: (target date for Short term goals are 3 weeks 05/09/2023)  1.Patient will demonstrate independent use of home exercise program to maintain progress from in clinic treatments. Goal status: met  LONG TERM GOALS: (target dates for all long term goals are 10 weeks  06/27/2023 )   1. Patient  will demonstrate/report pain at worst less than or equal to 2/10 to facilitate minimal limitation in daily activity secondary to pain symptoms. Goal status: on going 05/28/2023   2. Patient will demonstrate independent use of home exercise program to facilitate ability to maintain/progress functional gains from skilled physical therapy services. Goal status: on going 05/28/2023   3. Patient will demonstrate FOTO outcome > or = 67 % to indicate reduced disability due to condition. Goal status: on going 05/28/2023   4.  Patient will demonstrate cervical AROM WFL s symptoms to facilitate usual head movements for daily activity including driving, self care.   Goal status: on going 05/28/2023   5.  Patient will demonstrate/report ability to sleep s restriction due to symptoms.   Goal status: on going 05/28/2023   6.  Patient will demonstrate/report ability to perform yard/garden activity at [facility].  Goal status: on going 05/28/2023     PLAN:  PT FREQUENCY: 1-2x/week  PT DURATION: 10 weeks  PLANNED INTERVENTIONS: Therapeutic exercises,  Therapeutic activity, Neuro Muscular re-education, Balance training, Gait training, Patient/Family education, Joint mobilization, Stair training, DME instructions, Dry Needling, Electrical stimulation, Cryotherapy, vasopneumatic device,Traction, Moist heat, Taping, Ultrasound, Ionotophoresis 4mg /ml Dexamethasone, and aquatic therapy, Manual therapy.  All included unless contraindicated  PLAN FOR NEXT SESSION:  Due to symptoms, continued skilled PT c manual mobility gains, DN as desired.   Chyrel Masson, PT, DPT, OCS, ATC 05/28/23  3:54 PM   PHYSICAL THERAPY DISCHARGE SUMMARY  Visits from Start of Care: 8  Current functional level related to goals / functional outcomes: See note   Remaining deficits: See note   Education / Equipment: HEP  Patient goals were partially met. Patient is being discharged due to not returning since the last visit.  Chyrel Masson, PT, DPT, OCS, ATC 07/09/23  3:17 PM

## 2023-06-07 ENCOUNTER — Encounter: Payer: Medicare Other | Admitting: Rehabilitative and Restorative Service Providers"

## 2023-06-11 ENCOUNTER — Other Ambulatory Visit: Payer: Self-pay | Admitting: Family Medicine

## 2023-06-11 DIAGNOSIS — Z1231 Encounter for screening mammogram for malignant neoplasm of breast: Secondary | ICD-10-CM

## 2023-06-13 ENCOUNTER — Encounter: Payer: Medicare Other | Admitting: Rehabilitative and Restorative Service Providers"

## 2023-06-14 ENCOUNTER — Ambulatory Visit
Admission: RE | Admit: 2023-06-14 | Discharge: 2023-06-14 | Disposition: A | Discharge status: Home / Self Care | Payer: Medicare Other | Source: Ambulatory Visit | Attending: Family Medicine | Admitting: Family Medicine

## 2023-06-14 DIAGNOSIS — Z1231 Encounter for screening mammogram for malignant neoplasm of breast: Secondary | ICD-10-CM

## 2023-06-26 ENCOUNTER — Encounter: Payer: Medicare Other | Admitting: Rehabilitative and Restorative Service Providers"

## 2023-07-03 ENCOUNTER — Encounter: Payer: Medicare Other | Admitting: Rehabilitative and Restorative Service Providers"

## 2023-12-24 ENCOUNTER — Other Ambulatory Visit (INDEPENDENT_AMBULATORY_CARE_PROVIDER_SITE_OTHER): Payer: Self-pay

## 2023-12-24 ENCOUNTER — Ambulatory Visit: Admitting: Orthopaedic Surgery

## 2023-12-24 ENCOUNTER — Encounter: Payer: Self-pay | Admitting: Orthopaedic Surgery

## 2023-12-24 DIAGNOSIS — M25561 Pain in right knee: Secondary | ICD-10-CM | POA: Diagnosis not present

## 2023-12-24 DIAGNOSIS — M1711 Unilateral primary osteoarthritis, right knee: Secondary | ICD-10-CM | POA: Diagnosis not present

## 2023-12-24 NOTE — Progress Notes (Signed)
 The patient is an active 78 year old female who comes in with worsening right knee pain for quite some time now.  We have actually seen her before for multiple other orthopedic issues.  Her and her husband are incredibly active.  They spend 3 to 4 months every winter in [place] .  She has been dealing with worsening knee pain since walking a lot in [place]  this winter.  She has been walking and biking as well.  She does report that that knee is not straight as far as her right knee goes.  It does wake her up at night and it is definitely affecting her mobility, quality of life and actives of daily living.  Examination of her right knee does show valgus malalignment of that knee.  Her left knee is nice and straight with the right knee does show valgus malalignment.  There is pain throughout the arc of motion of the right knee.  There is significant patellofemoral crepitation as well.  2 views of the right knee show bone-on-bone arthritic changes of the lateral aspect of the knee with valgus malalignment.  There are osteophytes in all 3 compartments and significant patellofemoral narrowing.  We had a long and thorough discussion about knee replacement surgery.  She has had a left shoulder replacement in the past that has done fairly well.  She is still dealing with neck stiffness and cervical spine issues.  She would like to try to wait until the fall of this year to consider knee replacement surgery.  I showed her knee replacement model and we did discuss the risk and benefits of the surgery on talked about what to expect from an intraoperative and postoperative standpoint.  Will see her back in about 3 months from now so we can then discuss further surgery and getting it scheduled for the early fall so she can recover before heading back to [place] .

## 2023-12-31 ENCOUNTER — Telehealth: Payer: Self-pay | Admitting: Orthopaedic Surgery

## 2023-12-31 NOTE — Telephone Encounter (Signed)
 Patient called. She would like to go ahead with surgery.

## 2024-03-24 ENCOUNTER — Ambulatory Visit: Admitting: Orthopaedic Surgery

## 2024-04-08 ENCOUNTER — Other Ambulatory Visit: Payer: Self-pay | Admitting: Physician Assistant

## 2024-04-08 DIAGNOSIS — Z01818 Encounter for other preprocedural examination: Secondary | ICD-10-CM

## 2024-04-17 ENCOUNTER — Encounter (HOSPITAL_COMMUNITY): Payer: Self-pay

## 2024-04-17 NOTE — Progress Notes (Signed)
 Surgical Instructions   Your procedure is scheduled on Tuesday, August 12th, 2025. Report to [facility] Main Entrance A at 6:45 A.M., then check in with the Admitting office. Any questions or running late day of surgery: call [phone]  Questions prior to your surgery date: call [phone], Monday-Friday, 8am-4pm. If you experience any cold or flu symptoms such as cough, fever, chills, shortness of breath, etc. between now and your scheduled surgery, please notify us  at the above number.     Remember:  Do not eat after midnight the night before your surgery  You may drink clear liquids until 5:45 the morning of your surgery.   Clear liquids allowed are: Water, Non-Citrus Juices (without pulp), Carbonated Beverages, Clear Tea (no milk, honey, etc.), Black Coffee Only (NO MILK, CREAM OR POWDERED CREAMER of any kind), and Gatorade.   Patient Instructions  The night before surgery:  No food after midnight. ONLY clear liquids after midnight  The day of surgery (if you do NOT have diabetes):  Drink ONE (1) Pre-Surgery Clear Ensure by 5:45 the morning of surgery. Drink in one sitting. Do not sip.  This drink was given to you during your hospital  pre-op appointment visit.  Nothing else to drink after completing the  Pre-Surgery Clear Ensure.          If you have questions, please contact your surgeon's office.     Take these medicines the morning of surgery with A SIP OF WATER: Ezetimibe (Zetia)    May take these medicines IF NEEDED: None.     One week prior to surgery, STOP taking any Aspirin  (unless otherwise instructed by your surgeon) Aleve, Naproxen, Ibuprofen, Motrin, Advil, Goody's, BC's, all herbal medications, fish oil, and non-prescription vitamins.                     Do NOT Smoke (Tobacco/Vaping) for 24 hours prior to your procedure.  If you use a CPAP at night, you may bring your mask/headgear for your overnight stay.   You will be asked to remove any  contacts, glasses, piercing's, hearing aid's, dentures/partials prior to surgery. Please bring cases for these items if needed.    Patients discharged the day of surgery will not be allowed to drive home, and someone needs to stay with them for 24 hours.  SURGICAL WAITING ROOM VISITATION Patients may have no more than 2 support people in the waiting area - these visitors may rotate.   Pre-op nurse will coordinate an appropriate time for 1 ADULT support person, who may not rotate, to accompany patient in pre-op.  Children under the age of 44 must have an adult with them who is not the patient and must remain in the main waiting area with an adult.  If the patient needs to stay at the hospital during part of their recovery, the visitor guidelines for inpatient rooms apply.  Please refer to the [facility] website for the visitor guidelines for any additional information.   If you received a COVID test during your pre-op visit  it is requested that you wear a mask when out in public, stay away from anyone that may not be feeling well and notify your surgeon if you develop symptoms. If you have been in contact with anyone that has tested positive in the last 10 days please notify you surgeon.      Pre-operative 5 CHG Bathing Instructions   You can play a key role in reducing the risk of  infection after surgery. Your skin needs to be as free of germs as possible. You can reduce the number of germs on your skin by washing with CHG (chlorhexidine  gluconate) soap before surgery. CHG is an antiseptic soap that kills germs and continues to kill germs even after washing.   DO NOT use if you have an allergy to chlorhexidine /CHG or antibacterial soaps. If your skin becomes reddened or irritated, stop using the CHG and notify one of our RNs at [phone].   Please shower with the CHG soap starting 4 days before surgery using the following schedule:     Please keep in mind the following:  DO NOT  shave, including legs and underarms, starting the day of your first shower.   You may shave your face at any point before/day of surgery.  Place clean sheets on your bed the day you start using CHG soap. Use a clean washcloth (not used since being washed) for each shower. DO NOT sleep with pets once you start using the CHG.   CHG Shower Instructions:  Wash your face and private area with normal soap. If you choose to wash your hair, wash first with your normal shampoo.  After you use shampoo/soap, rinse your hair and body thoroughly to remove shampoo/soap residue.  Turn the water OFF and apply about 3 tablespoons (45 ml) of CHG soap to a CLEAN washcloth.  Apply CHG soap ONLY FROM YOUR NECK DOWN TO YOUR TOES (washing for 3-5 minutes)  DO NOT use CHG soap on face, private areas, open wounds, or sores.  Pay special attention to the area where your surgery is being performed.  If you are having back surgery, having someone wash your back for you may be helpful. Wait 2 minutes after CHG soap is applied, then you may rinse off the CHG soap.  Pat dry with a clean towel  Put on clean clothes/pajamas   If you choose to wear lotion, please use ONLY the CHG-compatible lotions that are listed below.  Additional instructions for the day of surgery: DO NOT APPLY any lotions, deodorants, cologne, or perfumes.   Do not bring valuables to the hospital. [facility] is not responsible for any belongings/valuables. Do not wear nail polish, gel polish, artificial nails, or any other type of covering on natural nails (fingers and toes) Do not wear jewelry or makeup Put on clean/comfortable clothes.  Please brush your teeth.  Ask your nurse before applying any prescription medications to the skin.     CHG Compatible Lotions   Aveeno Moisturizing lotion  Cetaphil Moisturizing Cream  Cetaphil Moisturizing Lotion  Clairol Herbal Essence Moisturizing Lotion, Dry Skin  Clairol Herbal Essence Moisturizing  Lotion, Extra Dry Skin  Clairol Herbal Essence Moisturizing Lotion, Normal Skin  Curel Age Defying Therapeutic Moisturizing Lotion with Alpha Hydroxy  Curel Extreme Care Body Lotion  Curel Soothing Hands Moisturizing Hand Lotion  Curel Therapeutic Moisturizing Cream, Fragrance-Free  Curel Therapeutic Moisturizing Lotion, Fragrance-Free  Curel Therapeutic Moisturizing Lotion, Original Formula  Eucerin Daily Replenishing Lotion  Eucerin Dry Skin Therapy Plus Alpha Hydroxy Crme  Eucerin Dry Skin Therapy Plus Alpha Hydroxy Lotion  Eucerin Original Crme  Eucerin Original Lotion  Eucerin Plus Crme Eucerin Plus Lotion  Eucerin TriLipid Replenishing Lotion  Keri Anti-Bacterial Hand Lotion  Keri Deep Conditioning Original Lotion Dry Skin Formula Softly Scented  Keri Deep Conditioning Original Lotion, Fragrance Free Sensitive Skin Formula  Keri Lotion Fast Absorbing Fragrance Free Sensitive Skin Formula  Keri Lotion Fast Absorbing Softly  Scented Dry Skin Formula  Keri Original Lotion  Keri Skin Renewal Lotion Keri Silky Smooth Lotion  Keri Silky Smooth Sensitive Skin Lotion  Nivea Body Creamy Conditioning Oil  Nivea Body Extra Enriched Lotion  Nivea Body Original Lotion  Nivea Body Sheer Moisturizing Lotion Nivea Crme  Nivea Skin Firming Lotion  NutraDerm 30 Skin Lotion  NutraDerm Skin Lotion  NutraDerm Therapeutic Skin Cream  NutraDerm Therapeutic Skin Lotion  ProShield Protective Hand Cream  Provon moisturizing lotion  Please read over the following fact sheets that you were given.

## 2024-04-18 ENCOUNTER — Telehealth: Payer: Self-pay

## 2024-04-18 ENCOUNTER — Other Ambulatory Visit: Payer: Self-pay

## 2024-04-18 ENCOUNTER — Encounter (HOSPITAL_COMMUNITY): Payer: Self-pay

## 2024-04-18 ENCOUNTER — Other Ambulatory Visit: Payer: Self-pay | Admitting: Orthopaedic Surgery

## 2024-04-18 ENCOUNTER — Encounter (HOSPITAL_COMMUNITY)
Admission: RE | Admit: 2024-04-18 | Discharge: 2024-04-18 | Disposition: A | Discharge status: Home / Self Care | Source: Ambulatory Visit | Attending: Orthopaedic Surgery | Admitting: Orthopaedic Surgery

## 2024-04-18 VITALS — BP 151/79 | HR 72 | Temp 98.1°F | Resp 16 | Ht 64.0 in | Wt 145.5 lb

## 2024-04-18 DIAGNOSIS — Z01818 Encounter for other preprocedural examination: Secondary | ICD-10-CM | POA: Insufficient documentation

## 2024-04-18 DIAGNOSIS — R9431 Abnormal electrocardiogram [ECG] [EKG]: Secondary | ICD-10-CM | POA: Diagnosis not present

## 2024-04-18 DIAGNOSIS — I517 Cardiomegaly: Secondary | ICD-10-CM | POA: Diagnosis not present

## 2024-04-18 HISTORY — DX: Essential (primary) hypertension: I10

## 2024-04-18 LAB — URINALYSIS, ROUTINE W REFLEX MICROSCOPIC
Bilirubin Urine: NEGATIVE
Glucose, UA: NEGATIVE mg/dL
Hgb urine dipstick: NEGATIVE
Ketones, ur: NEGATIVE mg/dL
Nitrite: NEGATIVE
Protein, ur: NEGATIVE mg/dL
Specific Gravity, Urine: 1.014 (ref 1.005–1.030)
pH: 6 (ref 5.0–8.0)

## 2024-04-18 LAB — COMPREHENSIVE METABOLIC PANEL WITH GFR
ALT: 18 U/L (ref 0–44)
AST: 20 U/L (ref 15–41)
Albumin: 3.6 g/dL (ref 3.5–5.0)
Alkaline Phosphatase: 49 U/L (ref 38–126)
Anion gap: 10 (ref 5–15)
BUN: 26 mg/dL — ABNORMAL HIGH (ref 8–23)
CO2: 27 mmol/L (ref 22–32)
Calcium: 9.3 mg/dL (ref 8.9–10.3)
Chloride: 102 mmol/L (ref 98–111)
Creatinine, Ser: 0.86 mg/dL (ref 0.44–1.00)
GFR, Estimated: >60 mL/min (ref >60)
Glucose, Bld: 117 mg/dL — ABNORMAL HIGH (ref 70–99)
Potassium: 3.7 mmol/L (ref 3.5–5.1)
Sodium: 139 mmol/L (ref 135–145)
Total Bilirubin: 0.8 mg/dL (ref 0.0–1.2)
Total Protein: 6.7 g/dL (ref 6.5–8.1)

## 2024-04-18 LAB — CBC
HCT: 39.6 % (ref 36.0–46.0)
Hemoglobin: 13 g/dL (ref 12.0–15.0)
MCH: 31.1 pg (ref 26.0–34.0)
MCHC: 32.8 g/dL (ref 30.0–36.0)
MCV: 94.7 fL (ref 80.0–100.0)
Platelets: 253 K/uL (ref 150–400)
RBC: 4.18 MIL/uL (ref 3.87–5.11)
RDW: 12.8 % (ref 11.5–15.5)
WBC: 10.2 K/uL (ref 4.0–10.5)
nRBC: 0 % (ref 0.0–0.2)

## 2024-04-18 LAB — TYPE AND SCREEN
ABO/RH(D): B POS
Antibody Screen: NEGATIVE

## 2024-04-18 LAB — SURGICAL PCR SCREEN
MRSA, PCR: NEGATIVE
Staphylococcus aureus: NEGATIVE

## 2024-04-18 MED ORDER — NITROFURANTOIN MONOHYD MACRO 100 MG PO CAPS: 100.0000 mg | ORAL_CAPSULE | Freq: Two times a day (BID) | ORAL | 0 refills | Status: DC

## 2024-04-18 NOTE — Progress Notes (Signed)
 PCP -  Alla Amis, MD   Cardiologist -   PPM/ICD - denies Device Orders - n/a Rep Notified - n/a  Chest x-ray - denies EKG - 04-18-24 Stress Test - denies ECHO - TTE- 09-28-05 Cardiac Cath - denies  Sleep Study - denies CPAP - n/a  Dm denies  Blood Thinner Instructions:denies Aspirin  Instructions:n/a  ERAS Protcol - clear liquids until 5:45 am. PRE-SURGERY Ensure   COVID TEST-    Anesthesia review: yes HX of HTN and review EKG  Patient denies shortness of breath, fever, cough and chest pain at PAT appointment   All instructions explained to the patient, with a verbal understanding of the material. Patient agrees to go over the instructions while at home for a better understanding. Patient also instructed to self quarantine after being tested for COVID-19. The opportunity to ask questions was provided.

## 2024-04-18 NOTE — Telephone Encounter (Signed)
 Called pt and was made aware of the UA results and the RX being sent in.

## 2024-04-18 NOTE — Progress Notes (Signed)
 Patient voiced concerns of increase in urination since stopping D-Mannose (MANNXTRA PO) supplement x 2 days. Reports she is prone to UTI's, denies any burning or pain with urination UA obtained

## 2024-04-18 NOTE — Telephone Encounter (Signed)
 R TKA 04/22/24  Pre-admissions [facility] called stating the pt was complaining of frequent urination so they decided to do an UA.  Results: Positive on UA- leukocytes large  Bacteria-few

## 2024-04-18 NOTE — Progress Notes (Signed)
 Message left with Dr. Vernetta office regarding Urine showing Large leukocytes and few bacteria

## 2024-04-21 NOTE — H&P (Signed)
 TOTAL KNEE ADMISSION H&P  Patient is being admitted for right total knee arthroplasty.  Subjective:  Chief Complaint:right knee pain.  HPI: Brooke Cantrell, 78 y.o. female, has a history of pain and functional disability in the right knee due to arthritis and has failed non-surgical conservative treatments for greater than 12 weeks to includeNSAID's and/or analgesics, corticosteriod injections, viscosupplementation injections, flexibility and strengthening excercises, use of assistive devices, and activity modification.  Onset of symptoms was gradual, starting several years ago with gradually worsening course since that time. The patient noted no past surgery on the right knee(s).  Patient currently rates pain in the right knee(s) at 10 out of 10 with activity. Patient has night pain, worsening of pain with activity and weight bearing, pain that interferes with activities of daily living, pain with passive range of motion, crepitus, and joint swelling.  Patient has evidence of subchondral sclerosis, periarticular osteophytes, and joint space narrowing by imaging studies. There is no active infection.  Patient Active Problem List   Diagnosis Date Noted   Unilateral primary osteoarthritis, right knee 12/24/2023   Weakness    Hyperlipidemia    Acute respiratory failure due to COVID-19 (HCC) 06/10/2020   Pneumonia due to COVID-19 virus 06/10/2020   Hx of multiple pulmonary nodules 06/10/2020   Acute respiratory failure with hypoxia (HCC)    Lung nodule seen on imaging study 01/08/2020   History of breast cancer 04/14/2019   Sepsis (HCC) 04/02/2019   CAP (community acquired pneumonia) 04/02/2019   Elevated troponin 04/02/2019   Shoulder arthritis 04/25/2018   Primary osteoarthritis, 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   Chronic left shoulder pain 05/17/2017   Cervical disc disorder with radiculopathy 08/14/2016   Chronic infection of sinus 08/31/2015   Cough 08/31/2015   Allergic reaction 08/31/2015    Past Medical History:  Diagnosis Date   Arthritis    Breast cancer (HCC)    Left breast   Depression    mild, no meds   History of kidney stones    Hypertension     Past Surgical History:  Procedure Laterality Date   breast cancer surgery     BREAST ENHANCEMENT SURGERY     BREAST LUMPECTOMY Left    CATARACT EXTRACTION, BILATERAL     ECTOPIC PREGNANCY SURGERY     EYE SURGERY     INNER EAR SURGERY     TONSILLECTOMY     TOTAL SHOULDER ARTHROPLASTY Left 04/25/2018   TOTAL SHOULDER ARTHROPLASTY Left 04/25/2018   Procedure: LEFT TOTAL SHOULDER REPLACEMENT;  Surgeon: Addie Cordella Hamilton, MD;  Location: [facility] OR;  Service: Orthopedics;  Laterality: Left;   TUBAL LIGATION      No current facility-administered medications for this encounter.   Current Outpatient Medications  Medication Sig Dispense Refill Last Dose/Taking   calcium  citrate-vitamin D  (CITRACAL+D) 315-200 MG-UNIT tablet Take 2 tablets by mouth 2 (two) times daily with a meal.   Taking   chlorthalidone  (HYGROTON ) 25 MG tablet Take 25 mg by mouth daily.   Taking   cholecalciferol  (VITAMIN D3) 25 MCG (1000 UNIT) tablet Take 1,000 Units by mouth in the morning and at bedtime.   Taking   D-Mannose (MANNXTRA PO) Take 1 Dose by mouth daily.   Taking   ezetimibe  (ZETIA ) 10 MG tablet Take 10 mg by mouth daily.   Taking   Multiple Vitamin (MULTIVITAMIN) capsule Take 1 capsule by mouth daily.   Taking   nitrofurantoin , macrocrystal-monohydrate, (MACROBID ) 100 MG capsule Take 1 capsule (100 mg total) by mouth  2 (two) times daily. 20 capsule 0    Omega-3 Fatty Acids (OMEGA 3 PO) Take 1 capsule by mouth daily.   Taking   albuterol  (VENTOLIN  HFA) 108 (90 Base) MCG/ACT inhaler Inhale 2 puffs into the lungs every 6 (six) hours. (Patient not taking: Reported on 04/16/2024) 18 g 0 Not Taking   ascorbic acid  (VITAMIN C) 500 MG tablet Take 1 tablet (500 mg total) by mouth daily. (Patient not taking: Reported on 04/16/2024) 30 tablet 0 Not Taking    aspirin  81 MG EC tablet TAKE 1 TABLET (81 MG TOTAL) BY MOUTH 2 (TWO) TIMES DAILY. (Patient not taking: Reported on 04/16/2024) 90 tablet 1 Not Taking   chlorpheniramine-HYDROcodone (TUSSIONEX) 10-8 MG/5ML SUER Take 5 mLs by mouth every 12 (twelve) hours as needed for cough. (Patient not taking: Reported on 04/16/2024) 70 mL 0 Not Taking   montelukast  (SINGULAIR ) 10 MG tablet Take 1 tablet (10 mg total) by mouth at bedtime. TAKE ONE TABLET ONCE DAILY AS DIRECTED (Patient not taking: Reported on 04/16/2024)   Not Taking   predniSONE  (DELTASONE ) 20 MG tablet 2 tabs po daily for seven days (Patient not taking: Reported on 04/16/2024) 14 tablet 0 Not Taking   zinc  sulfate 220 (50 Zn) MG capsule Take 1 capsule (220 mg total) by mouth daily. (Patient not taking: Reported on 04/16/2024) 30 capsule 0 Not Taking   Allergies  Allergen Reactions   Ciprofloxacin Itching, Rash and Hives    Social History   Tobacco Use   Smoking status: Former    Current packs/day: 0.00    Types: Cigarettes    Quit date: 09/11/1996    Years since quitting: 27.6   Smokeless tobacco: Never  Substance Use Topics   Alcohol use: Yes    Alcohol/week: 5.0 - 7.0 standard drinks of alcohol    Types: 5 - 7 Standard drinks or equivalent per week    Comment: 5-7 per week, when going out eat    Family History  Problem Relation Age of Onset   Allergic rhinitis Father    Allergic rhinitis Sister    Allergic rhinitis Brother    Breast cancer Neg Hx      Review of Systems  Objective:  Physical Exam Vitals reviewed.  Constitutional:      Appearance: Normal appearance. She is normal weight.  HENT:     Head: Normocephalic and atraumatic.  Eyes:     Extraocular Movements: Extraocular movements intact.     Pupils: Pupils are equal, round, and reactive to light.  Cardiovascular:     Rate and Rhythm: Normal rate and regular rhythm.  Pulmonary:     Effort: Pulmonary effort is normal.     Breath sounds: Normal breath sounds.   Abdominal:     Palpations: Abdomen is soft.  Musculoskeletal:     Cervical back: Normal range of motion and neck supple.     Right knee: Effusion, bony tenderness and crepitus present. Decreased range of motion. Tenderness present over the medial joint line and lateral joint line. Abnormal alignment.  Neurological:     Mental Status: She is alert and oriented to person, place, and time.  Psychiatric:        Behavior: Behavior normal.     Vital signs in last 24 hours:    Labs:   Estimated body mass index is 24.98 kg/m as calculated from the following:   Height as of 04/18/24: 5' 4 (1.626 m).   Weight as of 04/18/24: 66 kg.  Imaging Review Plain radiographs demonstrate severe degenerative joint disease of the right knee(s). The overall alignment ismild valgus. The bone quality appears to be good for age and reported activity level.      Assessment/Plan:  End stage arthritis, right knee   The patient history, physical examination, clinical judgment of the provider and imaging studies are consistent with end stage degenerative joint disease of the right knee(s) and total knee arthroplasty is deemed medically necessary. The treatment options including medical management, injection therapy arthroscopy and arthroplasty were discussed at length. The risks and benefits of total knee arthroplasty were presented and reviewed. The risks due to aseptic loosening, infection, stiffness, patella tracking problems, thromboembolic complications and other imponderables were discussed. The patient acknowledged the explanation, agreed to proceed with the plan and consent was signed. Patient is being admitted for inpatient treatment for surgery, pain control, PT, OT, prophylactic antibiotics, VTE prophylaxis, progressive ambulation and ADL's and discharge planning. The patient is planning to be discharged home with home health services

## 2024-04-22 ENCOUNTER — Encounter (HOSPITAL_COMMUNITY): Payer: Self-pay | Admitting: Physician Assistant

## 2024-04-22 ENCOUNTER — Encounter (HOSPITAL_COMMUNITY)
Admission: RE | Disposition: A | Discharge status: Home / Self Care | Payer: Self-pay | Source: Home / Self Care | Attending: Orthopaedic Surgery

## 2024-04-22 ENCOUNTER — Encounter (HOSPITAL_COMMUNITY): Payer: Self-pay | Admitting: Orthopaedic Surgery

## 2024-04-22 ENCOUNTER — Ambulatory Visit (HOSPITAL_BASED_OUTPATIENT_CLINIC_OR_DEPARTMENT_OTHER): Payer: Self-pay | Admitting: Anesthesiology

## 2024-04-22 ENCOUNTER — Observation Stay (HOSPITAL_COMMUNITY)

## 2024-04-22 ENCOUNTER — Observation Stay (HOSPITAL_COMMUNITY)
Admission: RE | Admit: 2024-04-22 | Discharge: 2024-04-23 | Disposition: A | Discharge status: Home / Self Care | Attending: Orthopaedic Surgery | Admitting: Orthopaedic Surgery

## 2024-04-22 ENCOUNTER — Other Ambulatory Visit: Payer: Self-pay

## 2024-04-22 DIAGNOSIS — Z96651 Presence of right artificial knee joint: Secondary | ICD-10-CM

## 2024-04-22 DIAGNOSIS — Z87891 Personal history of nicotine dependence: Secondary | ICD-10-CM | POA: Diagnosis not present

## 2024-04-22 DIAGNOSIS — F1090 Alcohol use, unspecified, uncomplicated: Secondary | ICD-10-CM | POA: Insufficient documentation

## 2024-04-22 DIAGNOSIS — I10 Essential (primary) hypertension: Secondary | ICD-10-CM | POA: Insufficient documentation

## 2024-04-22 DIAGNOSIS — Z7982 Long term (current) use of aspirin: Secondary | ICD-10-CM | POA: Insufficient documentation

## 2024-04-22 DIAGNOSIS — M1711 Unilateral primary osteoarthritis, right knee: Secondary | ICD-10-CM

## 2024-04-22 HISTORY — PX: TOTAL KNEE ARTHROPLASTY: SHX125

## 2024-04-22 SURGERY — ARTHROPLASTY, KNEE, TOTAL
Anesthesia: Spinal | Site: Knee | Laterality: Right

## 2024-04-22 MED ORDER — SODIUM CHLORIDE 0.9 % IV SOLN: INTRAVENOUS | Status: AC

## 2024-04-22 MED ORDER — PHENOL 1.4 % MT LIQD: 1.0000 | OROMUCOSAL | Status: DC | PRN

## 2024-04-22 MED ORDER — POVIDONE-IODINE 10 % EX SWAB
2.0000 | Freq: Once | CUTANEOUS | Status: AC
  Administered 2024-04-22 (×2): 2 via TOPICAL

## 2024-04-22 MED ORDER — DEXAMETHASONE SODIUM PHOSPHATE 10 MG/ML IJ SOLN
INTRAMUSCULAR | Status: DC | PRN
  Administered 2024-04-22 (×2): 10 mg via INTRAVENOUS

## 2024-04-22 MED ORDER — FENTANYL CITRATE (PF) 100 MCG/2ML IJ SOLN
INTRAMUSCULAR | Status: AC
  Administered 2024-04-22 (×2): 50 ug via INTRAVENOUS
  Filled 2024-04-22: qty 2

## 2024-04-22 MED ORDER — HYDROXYZINE HCL 50 MG/ML IM SOLN
50.0000 mg | Freq: Four times a day (QID) | INTRAMUSCULAR | Status: DC | PRN
  Administered 2024-04-22 (×2): 50 mg via INTRAMUSCULAR
  Filled 2024-04-22: qty 1

## 2024-04-22 MED ORDER — METHOCARBAMOL 1000 MG/10ML IJ SOLN
500.0000 mg | Freq: Four times a day (QID) | INTRAMUSCULAR | Status: DC | PRN
  Administered 2024-04-22 (×4): 500 mg via INTRAVENOUS
  Filled 2024-04-22: qty 5

## 2024-04-22 MED ORDER — ACETAMINOPHEN 325 MG PO TABS: 325.0000 mg | ORAL_TABLET | Freq: Four times a day (QID) | ORAL | Status: DC | PRN

## 2024-04-22 MED ORDER — METHOCARBAMOL 1000 MG/10ML IJ SOLN
INTRAMUSCULAR | Status: AC
  Filled 2024-04-22: qty 10

## 2024-04-22 MED ORDER — CHLORTHALIDONE 25 MG PO TABS
25.0000 mg | ORAL_TABLET | Freq: Every day | ORAL | Status: DC
  Administered 2024-04-22 – 2024-04-23 (×4): 25 mg via ORAL
  Filled 2024-04-22 (×2): qty 1

## 2024-04-22 MED ORDER — BUPIVACAINE-EPINEPHRINE (PF) 0.25% -1:200000 IJ SOLN
INTRAMUSCULAR | Status: AC
Start: 2024-04-22 — End: 2024-04-22
  Filled 2024-04-22: qty 30

## 2024-04-22 MED ORDER — ONDANSETRON HCL 4 MG/2ML IJ SOLN: 4.0000 mg | Freq: Once | INTRAMUSCULAR | Status: DC | PRN

## 2024-04-22 MED ORDER — FENTANYL CITRATE (PF) 250 MCG/5ML IJ SOLN
INTRAMUSCULAR | Status: DC | PRN
  Administered 2024-04-22 (×2): 50 ug via INTRAVENOUS

## 2024-04-22 MED ORDER — MIDAZOLAM HCL 2 MG/2ML IJ SOLN
INTRAMUSCULAR | Status: DC
Start: 2024-04-22 — End: 2024-04-22
  Filled 2024-04-22: qty 2

## 2024-04-22 MED ORDER — HYDROMORPHONE HCL 1 MG/ML IJ SOLN
0.5000 mg | INTRAMUSCULAR | Status: DC | PRN
  Administered 2024-04-22 (×2): 1 mg via INTRAVENOUS
  Filled 2024-04-22: qty 1

## 2024-04-22 MED ORDER — ACETAMINOPHEN 500 MG PO TABS: 1000.0000 mg | ORAL_TABLET | Freq: Once | ORAL | Status: AC

## 2024-04-22 MED ORDER — TRANEXAMIC ACID-NACL 1000-0.7 MG/100ML-% IV SOLN
1000.0000 mg | INTRAVENOUS | Status: AC
  Administered 2024-04-22 (×2): 1000 mg via INTRAVENOUS
  Filled 2024-04-22: qty 100

## 2024-04-22 MED ORDER — CHLORHEXIDINE GLUCONATE 0.12 % MT SOLN
OROMUCOSAL | Status: AC
  Administered 2024-04-22 (×2): 15 mL via OROMUCOSAL
  Filled 2024-04-22: qty 15

## 2024-04-22 MED ORDER — CEFAZOLIN SODIUM-DEXTROSE 2-4 GM/100ML-% IV SOLN
2.0000 g | Freq: Four times a day (QID) | INTRAVENOUS | Status: AC
  Administered 2024-04-22 (×4): 2 g via INTRAVENOUS
  Filled 2024-04-22 (×2): qty 100

## 2024-04-22 MED ORDER — FENTANYL CITRATE (PF) 100 MCG/2ML IJ SOLN: 50.0000 ug | Freq: Once | INTRAMUSCULAR | Status: AC

## 2024-04-22 MED ORDER — PHENYLEPHRINE HCL-NACL 20-0.9 MG/250ML-% IV SOLN
INTRAVENOUS | Status: DC | PRN
Start: 2024-04-22 — End: 2024-04-22
  Administered 2024-04-22 (×2): 20 ug/min via INTRAVENOUS

## 2024-04-22 MED ORDER — ONDANSETRON HCL 4 MG PO TABS
4.0000 mg | ORAL_TABLET | Freq: Four times a day (QID) | ORAL | Status: DC | PRN
  Administered 2024-04-22 (×2): 4 mg via ORAL
  Filled 2024-04-22: qty 1

## 2024-04-22 MED ORDER — NITROFURANTOIN MONOHYD MACRO 100 MG PO CAPS
100.0000 mg | ORAL_CAPSULE | Freq: Two times a day (BID) | ORAL | Status: DC
  Administered 2024-04-22 – 2024-04-23 (×6): 100 mg via ORAL
  Filled 2024-04-22 (×4): qty 1

## 2024-04-22 MED ORDER — MENTHOL 3 MG MT LOZG: 1.0000 | LOZENGE | OROMUCOSAL | Status: DC | PRN

## 2024-04-22 MED ORDER — PROPOFOL 10 MG/ML IV BOLUS
INTRAVENOUS | Status: AC
  Filled 2024-04-22: qty 20

## 2024-04-22 MED ORDER — OXYCODONE HCL 5 MG PO TABS: 5.0000 mg | ORAL_TABLET | Freq: Once | ORAL | Status: DC | PRN

## 2024-04-22 MED ORDER — BUPIVACAINE HCL (PF) 0.25 % IJ SOLN
INTRAMUSCULAR | Status: AC
  Filled 2024-04-22: qty 30

## 2024-04-22 MED ORDER — FENTANYL CITRATE (PF) 100 MCG/2ML IJ SOLN
25.0000 ug | INTRAMUSCULAR | Status: DC | PRN
  Administered 2024-04-22 (×4): 50 ug via INTRAVENOUS

## 2024-04-22 MED ORDER — OXYCODONE HCL 5 MG/5ML PO SOLN: 5.0000 mg | Freq: Once | ORAL | Status: DC | PRN

## 2024-04-22 MED ORDER — DOCUSATE SODIUM 100 MG PO CAPS
100.0000 mg | ORAL_CAPSULE | Freq: Two times a day (BID) | ORAL | Status: DC
  Administered 2024-04-22 – 2024-04-23 (×6): 100 mg via ORAL
  Filled 2024-04-22 (×3): qty 1

## 2024-04-22 MED ORDER — ASPIRIN 81 MG PO CHEW
81.0000 mg | CHEWABLE_TABLET | Freq: Two times a day (BID) | ORAL | Status: DC
  Administered 2024-04-22 – 2024-04-23 (×4): 81 mg via ORAL
  Filled 2024-04-22 (×2): qty 1

## 2024-04-22 MED ORDER — LACTATED RINGERS IV SOLN: INTRAVENOUS | Status: DC

## 2024-04-22 MED ORDER — SODIUM CHLORIDE 0.9 % IR SOLN
Status: DC | PRN
Start: 2024-04-22 — End: 2024-04-22
  Administered 2024-04-22 (×2): 1000 mL

## 2024-04-22 MED ORDER — ONDANSETRON HCL 4 MG/2ML IJ SOLN
INTRAMUSCULAR | Status: DC | PRN
  Administered 2024-04-22 (×2): 4 mg via INTRAVENOUS

## 2024-04-22 MED ORDER — PANTOPRAZOLE SODIUM 40 MG PO TBEC
40.0000 mg | DELAYED_RELEASE_TABLET | Freq: Every day | ORAL | Status: DC
  Filled 2024-04-22 (×2): qty 1

## 2024-04-22 MED ORDER — BUPIVACAINE HCL (PF) 0.25 % IJ SOLN
INTRAMUSCULAR | Status: DC | PRN
  Administered 2024-04-22 (×2): 20 mL via PERINEURAL

## 2024-04-22 MED ORDER — OXYCODONE HCL 5 MG PO TABS
5.0000 mg | ORAL_TABLET | ORAL | Status: DC | PRN
  Administered 2024-04-22 (×2): 10 mg via ORAL
  Administered 2024-04-23 (×4): 5 mg via ORAL
  Filled 2024-04-22 (×4): qty 2
  Filled 2024-04-22: qty 1

## 2024-04-22 MED ORDER — ALUM & MAG HYDROXIDE-SIMETH 200-200-20 MG/5ML PO SUSP: 30.0000 mL | ORAL | Status: DC | PRN

## 2024-04-22 MED ORDER — STERILE WATER FOR IRRIGATION IR SOLN
Status: DC | PRN
  Administered 2024-04-22 (×4): 1000 mL

## 2024-04-22 MED ORDER — 0.9 % SODIUM CHLORIDE (POUR BTL) OPTIME
TOPICAL | Status: DC | PRN
  Administered 2024-04-22 (×2): 1000 mL

## 2024-04-22 MED ORDER — ADULT MULTIVITAMIN W/MINERALS CH
1.0000 | ORAL_TABLET | Freq: Every day | ORAL | Status: DC
  Administered 2024-04-22 – 2024-04-23 (×4): 1 via ORAL
  Filled 2024-04-22 (×2): qty 1

## 2024-04-22 MED ORDER — FENTANYL CITRATE (PF) 250 MCG/5ML IJ SOLN
INTRAMUSCULAR | Status: AC
  Filled 2024-04-22: qty 5

## 2024-04-22 MED ORDER — METOCLOPRAMIDE HCL 5 MG PO TABS: 5.0000 mg | ORAL_TABLET | Freq: Three times a day (TID) | ORAL | Status: DC | PRN

## 2024-04-22 MED ORDER — ACETAMINOPHEN 500 MG PO TABS
ORAL_TABLET | ORAL | Status: AC
  Administered 2024-04-22 (×2): 1000 mg via ORAL
  Filled 2024-04-22: qty 2

## 2024-04-22 MED ORDER — BUPIVACAINE-EPINEPHRINE (PF) 0.25% -1:200000 IJ SOLN
INTRAMUSCULAR | Status: DC | PRN
  Administered 2024-04-22 (×2): 30 mL

## 2024-04-22 MED ORDER — ORAL CARE MOUTH RINSE: 15.0000 mL | Freq: Once | OROMUCOSAL | Status: AC

## 2024-04-22 MED ORDER — PROPOFOL 10 MG/ML IV BOLUS
INTRAVENOUS | Status: DC | PRN
  Administered 2024-04-22: 20 mg via INTRAVENOUS
  Administered 2024-04-22: 40 mg via INTRAVENOUS
  Administered 2024-04-22: 20 mg via INTRAVENOUS
  Administered 2024-04-22: 40 mg via INTRAVENOUS

## 2024-04-22 MED ORDER — METHOCARBAMOL 500 MG PO TABS
500.0000 mg | ORAL_TABLET | Freq: Four times a day (QID) | ORAL | Status: DC | PRN
  Administered 2024-04-22 (×2): 500 mg via ORAL
  Filled 2024-04-22 (×2): qty 1

## 2024-04-22 MED ORDER — PROPOFOL 500 MG/50ML IV EMUL
INTRAVENOUS | Status: DC | PRN
  Administered 2024-04-22 (×2): 125 ug/kg/min via INTRAVENOUS

## 2024-04-22 MED ORDER — FENTANYL CITRATE (PF) 100 MCG/2ML IJ SOLN
INTRAMUSCULAR | Status: AC
  Filled 2024-04-22: qty 2

## 2024-04-22 MED ORDER — ONDANSETRON HCL 4 MG/2ML IJ SOLN: 4.0000 mg | Freq: Four times a day (QID) | INTRAMUSCULAR | Status: DC | PRN

## 2024-04-22 MED ORDER — CEFAZOLIN SODIUM-DEXTROSE 2-4 GM/100ML-% IV SOLN
2.0000 g | INTRAVENOUS | Status: AC
  Administered 2024-04-22 (×2): 2 g via INTRAVENOUS
  Filled 2024-04-22: qty 100

## 2024-04-22 MED ORDER — CHLORHEXIDINE GLUCONATE 0.12 % MT SOLN: 15.0000 mL | Freq: Once | OROMUCOSAL | Status: AC

## 2024-04-22 MED ORDER — METOCLOPRAMIDE HCL 5 MG/ML IJ SOLN: 5.0000 mg | Freq: Three times a day (TID) | INTRAMUSCULAR | Status: DC | PRN

## 2024-04-22 MED ORDER — EZETIMIBE 10 MG PO TABS
10.0000 mg | ORAL_TABLET | Freq: Every day | ORAL | Status: DC
  Administered 2024-04-22 – 2024-04-23 (×4): 10 mg via ORAL
  Filled 2024-04-22 (×2): qty 1

## 2024-04-22 MED ORDER — VITAMIN D 25 MCG (1000 UNIT) PO TABS
1000.0000 [IU] | ORAL_TABLET | Freq: Every day | ORAL | Status: DC
  Administered 2024-04-22 – 2024-04-23 (×4): 1000 [IU] via ORAL
  Filled 2024-04-22 (×2): qty 1

## 2024-04-22 MED ORDER — OXYCODONE HCL 5 MG PO TABS
10.0000 mg | ORAL_TABLET | ORAL | Status: DC | PRN
  Administered 2024-04-22 – 2024-04-23 (×6): 10 mg via ORAL
  Filled 2024-04-22: qty 2

## 2024-04-22 SURGICAL SUPPLY — 58 items
BAG COUNTER SPONGE SURGICOUNT (BAG) ×1 IMPLANT
BANDAGE ESMARK 6X9 LF (GAUZE/BANDAGES/DRESSINGS) ×1 IMPLANT
BLADE SAG 18X100X1.27 (BLADE) ×1 IMPLANT
BLADE SAW SGTL 11.0X1.19X90.0M (BLADE) IMPLANT
BNDG ELASTIC 6INX 5YD STR LF (GAUZE/BANDAGES/DRESSINGS) ×2 IMPLANT
BOWL SMART MIX CTS (DISPOSABLE) IMPLANT
CEMENT BONE R 1X40 (Cement) IMPLANT
COMPONET TIB PS KNEE E 0D RT (Joint) IMPLANT
COOLER ICEMAN CLASSIC (MISCELLANEOUS) ×1 IMPLANT
COVER SURGICAL LIGHT HANDLE (MISCELLANEOUS) ×1 IMPLANT
CUFF TOURN SGL QUICK 42 (TOURNIQUET CUFF) IMPLANT
CUFF TRNQT CYL 34X4.125X (TOURNIQUET CUFF) ×1 IMPLANT
DRAPE EXTREMITY T 121X128X90 (DISPOSABLE) ×1 IMPLANT
DRAPE HALF SHEET 40X57 (DRAPES) ×1 IMPLANT
DRAPE U-SHAPE 47X51 STRL (DRAPES) ×1 IMPLANT
DRSG XEROFORM 1X8 (GAUZE/BANDAGES/DRESSINGS) IMPLANT
DURAPREP 26ML APPLICATOR (WOUND CARE) ×1 IMPLANT
ELECT CAUTERY BLADE 6.4 (BLADE) ×1 IMPLANT
ELECTRODE REM PT RTRN 9FT ADLT (ELECTROSURGICAL) ×1 IMPLANT
FACESHIELD WRAPAROUND OR TEAM (MASK) ×2 IMPLANT
FEMUR CMTD CCR STD SZ8 R KNEE (Knees) IMPLANT
GAUZE PAD ABD 8X10 STRL (GAUZE/BANDAGES/DRESSINGS) ×1 IMPLANT
GAUZE SPONGE 4X4 12PLY STRL (GAUZE/BANDAGES/DRESSINGS) ×1 IMPLANT
GAUZE XEROFORM 1X8 LF (GAUZE/BANDAGES/DRESSINGS) ×1 IMPLANT
GLOVE BIOGEL PI IND STRL 8 (GLOVE) ×2 IMPLANT
GLOVE ORTHO TXT STRL SZ7.5 (GLOVE) ×1 IMPLANT
GLOVE SURG ORTHO 8.0 STRL STRW (GLOVE) ×1 IMPLANT
GOWN STRL REUS W/ TWL LRG LVL3 (GOWN DISPOSABLE) IMPLANT
GOWN STRL REUS W/ TWL XL LVL3 (GOWN DISPOSABLE) ×2 IMPLANT
IMMOBILIZER KNEE 22 UNIV (SOFTGOODS) ×1 IMPLANT
INSERT TIB ASF PSN 6-9X12 RT (Insert) IMPLANT
IV NS 1000ML BAXH (IV SOLUTION) ×1 IMPLANT
KIT BASIN OR (CUSTOM PROCEDURE TRAY) ×1 IMPLANT
KIT TURNOVER KIT B (KITS) ×1 IMPLANT
MANIFOLD NEPTUNE II (INSTRUMENTS) ×1 IMPLANT
NDL 18GX1X1/2 (RX/OR ONLY) (NEEDLE) IMPLANT
NEEDLE 18GX1X1/2 (RX/OR ONLY) (NEEDLE) IMPLANT
NS IRRIG 1000ML POUR BTL (IV SOLUTION) ×1 IMPLANT
PACK TOTAL JOINT (CUSTOM PROCEDURE TRAY) ×1 IMPLANT
PAD ARMBOARD POSITIONER FOAM (MISCELLANEOUS) ×1 IMPLANT
PAD COLD SHLDR WRAP-ON (PAD) ×1 IMPLANT
PADDING CAST COTTON 6X4 STRL (CAST SUPPLIES) ×1 IMPLANT
PIN DRILL HDLS TROCAR 75 4PK (PIN) IMPLANT
SCREW FEMALE HEX FIX 25X2.5 (ORTHOPEDIC DISPOSABLE SUPPLIES) IMPLANT
SET HNDPC FAN SPRY TIP SCT (DISPOSABLE) ×1 IMPLANT
SET PAD KNEE POSITIONER (MISCELLANEOUS) ×1 IMPLANT
STAPLER SKIN PROX 35W (STAPLE) ×1 IMPLANT
STEM POLY PAT PLY 32M KNEE (Knees) IMPLANT
SUCTION TUBE FRAZIER 10FR DISP (SUCTIONS) ×1 IMPLANT
SUT VIC AB 0 CT1 27XBRD ANBCTR (SUTURE) ×1 IMPLANT
SUT VIC AB 1 CT1 27XBRD ANBCTR (SUTURE) ×2 IMPLANT
SUT VIC AB 2-0 CT1 TAPERPNT 27 (SUTURE) ×2 IMPLANT
SYR 50ML LL SCALE MARK (SYRINGE) IMPLANT
SYR CONTROL 10ML LL (SYRINGE) ×1 IMPLANT
TOWEL GREEN STERILE (TOWEL DISPOSABLE) ×1 IMPLANT
TOWEL GREEN STERILE FF (TOWEL DISPOSABLE) ×1 IMPLANT
TRAY CATH INTERMITTENT SS 16FR (CATHETERS) IMPLANT
TRAY FOLEY W/BAG SLVR 14FR (SET/KITS/TRAYS/PACK) IMPLANT

## 2024-04-22 NOTE — Transfer of Care (Signed)
 Immediate Anesthesia Transfer of Care Note  Patient: Brooke Cantrell  Procedure(s) Performed: ARTHROPLASTY, KNEE, TOTAL (Right: Knee)  Patient Location: PACU  Anesthesia Type:MAC combined with regional for post-op pain  Level of Consciousness: awake, alert , and oriented  Airway & Oxygen Therapy: Patient Spontanous Breathing  Post-op Assessment: Report given to RN and Post -op Vital signs reviewed and stable  Post vital signs: Reviewed and stable  Last Vitals:  Vitals Value Taken Time  BP 120/80   Temp 98   Pulse 71 04/22/24 10:42  Resp 16 04/22/24 10:42  SpO2 98 % 04/22/24 10:42  Vitals shown include unfiled device data.  Last Pain:  Vitals:   04/22/24 0741  TempSrc:   PainSc: 0-No pain         Complications: No notable events documented.

## 2024-04-22 NOTE — Anesthesia Procedure Notes (Signed)
 Spinal  Patient location during procedure: OR Start time: 04/22/2024 9:00 AM End time: 04/22/2024 9:06 AM Reason for block: surgical anesthesia Staffing Performed: anesthesiologist  Anesthesiologist: Boone Fess, MD Performed by: Boone Fess, MD Authorized by: Boone Fess, MD   Preanesthetic Checklist Completed: patient identified, IV checked, site marked, risks and benefits discussed, surgical consent, monitors and equipment checked, pre-op evaluation and timeout performed Spinal Block Patient position: sitting Prep: ChloraPrep and site prepped and draped Patient monitoring: heart rate, continuous pulse ox, blood pressure and cardiac monitor Approach: midline Location: L4-5 Injection technique: single-shot Needle Needle type: Whitacre and Introducer  Needle gauge: 24 G Needle length: 9 cm Assessment Sensory level: T10 Events: CSF return Additional Notes Meticulous sterile technique used throughout (CHG prep, sterile gloves, sterile drape). Negative paresthesia. Negative blood return. Positive free-flowing CSF. Expiration date of kit checked and confirmed. Patient tolerated procedure well, without complications.

## 2024-04-22 NOTE — Interval H&P Note (Signed)
 History and Physical Interval Note: The patient understands that she is here today for a right total knee replacement to treat her significant right knee pain and arthritis.  There has been no acute or interval change in her medical status.  The risks and benefits of surgery have been discussed in detail and informed consent has been obtained.  The right operative knee has been marked.  04/22/2024 7:02 AM  Brooke Cantrell  has presented today for surgery, with the diagnosis of osteoarthritis right knee.  The various methods of treatment have been discussed with the patient and family. After consideration of risks, benefits and other options for treatment, the patient has consented to  Procedure(s): ARTHROPLASTY, KNEE, TOTAL (Right) as a surgical intervention.  The patient's history has been reviewed, patient examined, no change in status, stable for surgery.  I have reviewed the patient's chart and labs.  Questions were answered to the patient's satisfaction.     [name]

## 2024-04-22 NOTE — Op Note (Signed)
 Operative Note  Date of operation: 04/22/2024 Preoperative diagnosis: Right knee primary osteoarthritis Postoperative diagnosis: Same  Procedure: Right cemented total knee arthroplasty  Implants: Biomet/Zimmer persona cemented knee system Implant Name Type Inv. Item Serial No. Manufacturer Lot No. LRB No. Used Action  CEMENT BONE R 1X40 - [id number] Cement CEMENT BONE R 1X40  ZIMMER [employer]ON(ORTH,TRAU,BIO,SG) [id number] Right 2 Implanted  FEMUR CMTD CCR STD SZ8 R KNEE - [id number] Knees FEMUR CMTD CCR STD SZ8 R KNEE  ZIMMER [employer]ON(ORTH,TRAU,BIO,SG) [id number] Right 1 Implanted  STEM POLY PAT PLY 1M KNEE - [id number] Knees STEM POLY PAT PLY 1M KNEE  ZIMMER RECON(ORTH,TRAU,BIO,SG) [id number] Right 1 Implanted  COMPONET TIB PS KNEE E 0D RT - [id number] Joint COMPONET TIB PS KNEE E 0D RT  ZIMMER RECON(ORTH,TRAU,BIO,SG) [id number] Right 1 Implanted  INSERT TIB ASF PSN 6-9X12 RT - [id number] Insert INSERT TIB ASF PSN 6-9X12 RT  ZIMMER [employer]ON(ORTH,TRAU,BIO,SG) [id number] Right 1 Implanted   Surgeon: Lonni GRADE. Vernetta, MD Assistant: Tory Gaskins, PA-C  Anesthesia: #1 right lower extremity adductor canal block, #2 spinal, #3 local Tourniquet time: Under 1 hour EBL: Less than 50 cc Antibiotics: IV Ancef  Complications: None  Indications: The patient is a 78 year old active female with severe debilitating arthritis of her right knee.  She has been a long-term patient of mine.  We follow her for this knee for many years now.  She has valgus malalignment that knee at this point her right knee pain is daily and it is detrimental affecting her mobility, her quality of life and her actives daily living.  Her x-rays show bone-on-bone wear.  She has tried about all forms of conservative treatment and does wish to proceed with a knee replacement and we reviewed this as well.  We did discuss the risks of acute blood loss anemia, nerve and vessel injury, fracture, infection, DVT and implant failure.  She understands  that our goals are hopefully decreased pain, improved mobility and improved quality of life.  Procedure description: After informed consent was obtained the appropriate right knee was marked, anesthesia obtained a right lower extremity adductor canal block in the holding room.  The patient was then brought to the operating room and set up on the operating table where spinal anesthesia was obtained.  She was laid in spine position on the operating table and a Foley catheter was placed.  A nonsterile tourniquet was placed around her upper right thigh and her right thigh, knee, leg, ankle and foot were prepped and draped with DuraPrep and sterile drapes.  A timeout was called and she was identified as the correct patient and the correct right knee.  An Esmarch was then used to wrap up the leg and the tourniquet was inflated to 300 mm pressure.  With the knee extended I did recommend an incision was made over the patella and carried proximally distally.  Dissection was carried down to the knee joint and a medial parapatellar arthrotomy is made finding a large joint effusion.  With the knee in a flexed position we found significant cartilage wear throughout the knee especially lateral compartment the knee.  Osteophytes were removed from all 3 compartments as well as remanence of the ACL, PCL and medial and lateral meniscus.  We then used an extramedullary cutting guide for making her proximal tibia cut correction of her varus and valgus in a's 5 degree slope.  We made this cut to take 2 mm off the low side.  We then used an intramedullary based cutting guide for  our distal femur cut for a right knee setting this at 5 degrees externally rotated and a 10 mm distal femoral cut.  Once we made the cut we did back down 2 more millimeters.  We then impacted the knee and with the 10 mm extension block to achieve full extension.  We then impacted the femur and put a femoral sizing guide based off the epicondylar axis.  Based  off of this we chose a size 8 femur.  We put a 4-in-1 cutting block for a size 8 femur and made our anterior and posterior cuts of our chamfer cuts and our femoral box cut.  We elevated the tibia and chose a size E tibial tray for coverage over the tibial plateau so the rotation of the tibial tubercle and the femur.  We did our drill hole and keel punch off of this.  We then trialed our size E right tibia combined with our size 8 right PS standard femur.  We trialed up to a 12 mm thickness PS polyethylene insert and we are pleased with range of motion and stability without insert.  We then made our patella cut and drilled 3 holes for a size 32 patella button.  Again with all trial instrumentation in the knee we are pleased with the range of motion and stability.  We then removed all trial components and irrigated the knee with normal saline solution using pulsatile lavage.  Next we placed Marcaine  with epinephrine  around the arthrotomy.  We then dried the knee very well and with the knee in a flexed position we mixed our cement and cemented our Biomet/Zimmer persona tibial tray for right knee size E followed by cementing our size 8 right PS standard femur.  We removed excess cement debris from the knee and placed our 12 mm thickness PS polyethylene insert and cemented our size 32 patella button.  We then held the knee fully compressed and extended while the cemented hardened.  Once the cement hardened the tourniquet was let down and hemostasis was obtained with electrocautery.  The arthrotomy was then closed with interrupted #1 Vicryl suture followed by 0 Vicryl to close the deep tissue and 2-0 Vicryl to close subcutaneous tissue.  The skin was closed with staples.  A well-padded sterile dressing was applied.  The patient was taken the recovery room.  Tory Gaskins, PA-C did assist in the entire case and beginning to end and his assistance was crucial and medically necessary for soft tissue management and retraction,  helping guide implant placement and a layered closure of the wound.

## 2024-04-22 NOTE — Anesthesia Procedure Notes (Signed)
 Anesthesia Regional Block: Adductor canal block   Pre-Anesthetic Checklist: , timeout performed,  Correct Patient, Correct Site, Correct Laterality,  Correct Procedure, Correct Position, site marked,  Risks and benefits discussed,  Surgical consent,  Pre-op evaluation,  At surgeon's request and post-op pain management  Laterality: Lower and Right  Prep: chloraprep       Needles:  Injection technique: Single-shot  Needle Type: Echogenic Needle     Needle Length: 9cm  Needle Gauge: 21     Additional Needles:   Procedures:,,,, ultrasound used (permanent image in chart),,    Narrative:  Start time: 04/22/2024 8:22 AM End time: 04/22/2024 8:26 AM Injection made incrementally with aspirations every 5 mL.  Performed by: Personally  Anesthesiologist: Boone Fess, MD  Additional Notes: Patient's chart reviewed and they were deemed appropriate candidate for procedure, per surgeon's request. Patient educated about risks, benefits, and alternatives of the block including but not limited to: temporary or permanent nerve damage, bleeding, infection, damage to surround tissues, block failure, local anesthetic toxicity. Patient expressed understanding. A formal time-out was conducted consistent with institution rules.  Monitors were applied, and minimal sedation used (see nursing record). The site was prepped with skin prep and allowed to dry, and sterile gloves were used. A high frequency linear ultrasound probe with probe cover was utilized throughout. Femoral artery visualized at mid-thigh level, local anesthetic injected anterolateral to it, and echogenic block needle trajectory was monitored throughout. Hydrodissection of saphenous nerve visualized and appeared anatomically normal. Aspiration performed every 5ml. Blood vessels were avoided. All injections were performed without resistance and free of blood and paresthesias. The patient tolerated the procedure well.  Injectate: 20ml 0.25%  bupivacaine 

## 2024-04-22 NOTE — Discharge Instructions (Signed)
 Per [facility] clinic policy, our goal is ensure optimal postoperative pain control with a multimodal pain management strategy. For all [facility] patients, our goal is to wean post-operative narcotic medications by 6 weeks post-operatively. If this is not possible due to utilization of pain medication prior to surgery, your [facility] doctor will support your acute post-operative pain control for the first 6 weeks postoperatively, with a plan to transition you back to your primary pain team following that. Maralee will work to ensure a Therapist, occupational.  INSTRUCTIONS AFTER JOINT REPLACEMENT   Remove items at home which could result in a fall. This includes throw rugs or furniture in walking pathways ICE to the affected joint every three hours while awake for 30 minutes at a time, for at least the first 3-5 days, and then as needed for pain and swelling.  Continue to use ice for pain and swelling. You may notice swelling that will progress down to the foot and ankle.  This is normal after surgery.  Elevate your leg when you are not up walking on it.   Continue to use the breathing machine you got in the hospital (incentive spirometer) which will help keep your temperature down.  It is common for your temperature to cycle up and down following surgery, especially at night when you are not up moving around and exerting yourself.  The breathing machine keeps your lungs expanded and your temperature down.   DIET:  As you were doing prior to hospitalization, we recommend a well-balanced diet.  DRESSING / WOUND CARE / SHOWERING  Keep the surgical dressing until follow up.  The dressing is water  proof, so you can shower without any extra covering.  IF THE DRESSING FALLS OFF or the wound gets wet inside, change the dressing with sterile gauze.  Please use good hand washing techniques before changing the dressing.  Do not use any lotions or creams on the incision until instructed by your surgeon.     ACTIVITY  Increase activity slowly as tolerated, but follow the weight bearing instructions below.   No driving for 6 weeks or until further direction given by your physician.  You cannot drive while taking narcotics.  No lifting or carrying greater than 10 lbs. until further directed by your surgeon. Avoid periods of inactivity such as sitting longer than an hour when not asleep. This helps prevent blood clots.  You may return to work once you are authorized by your doctor.     WEIGHT BEARING   Weight bearing as tolerated with assist device (walker, cane, etc) as directed, use it as long as suggested by your surgeon or therapist, typically at least 4-6 weeks.   EXERCISES  Results after joint replacement surgery are often greatly improved when you follow the exercise, range of motion and muscle strengthening exercises prescribed by your doctor. Safety measures are also important to protect the joint from further injury. Any time any of these exercises cause you to have increased pain or swelling, decrease what you are doing until you are comfortable again and then slowly increase them. If you have problems or questions, call your caregiver or physical therapist for advice.   Rehabilitation is important following a joint replacement. After just a few days of immobilization, the muscles of the leg can become weakened and shrink (atrophy).  These exercises are designed to build up the tone and strength of the thigh and leg muscles and to improve motion. Often times heat used for twenty to thirty minutes before  working out will loosen up your tissues and help with improving the range of motion but do not use heat for the first two weeks following surgery (sometimes heat can increase post-operative swelling).   These exercises can be done on a training (exercise) mat, on the floor, on a table or on a bed. Use whatever works the best and is most comfortable for you.    Use music or television  while you are exercising so that the exercises are a pleasant break in your day. This will make your life better with the exercises acting as a break in your routine that you can look forward to.   Perform all exercises about fifteen times, three times per day or as directed.  You should exercise both the operative leg and the other leg as well.  Exercises include:   Quad Sets - Tighten up the muscle on the front of the thigh (Quad) and hold for 5-10 seconds.   Straight Leg Raises - With your knee straight (if you were given a brace, keep it on), lift the leg to 60 degrees, hold for 3 seconds, and slowly lower the leg.  Perform this exercise against resistance later as your leg gets stronger.  Leg Slides: Lying on your back, slowly slide your foot toward your buttocks, bending your knee up off the floor (only go as far as is comfortable). Then slowly slide your foot back down until your leg is flat on the floor again.  Angel Wings: Lying on your back spread your legs to the side as far apart as you can without causing discomfort.  Hamstring Strength:  Lying on your back, push your heel against the floor with your leg straight by tightening up the muscles of your buttocks.  Repeat, but this time bend your knee to a comfortable angle, and push your heel against the floor.  You may put a pillow under the heel to make it more comfortable if necessary.   A rehabilitation program following joint replacement surgery can speed recovery and prevent re-injury in the future due to weakened muscles. Contact your doctor or a physical therapist for more information on knee rehabilitation.    CONSTIPATION  Constipation is defined medically as fewer than three stools per week and severe constipation as less than one stool per week.  Even if you have a regular bowel pattern at home, your normal regimen is likely to be disrupted due to multiple reasons following surgery.  Combination of anesthesia, postoperative  narcotics, change in appetite and fluid intake all can affect your bowels.   YOU MUST use at least one of the following options; they are listed in order of increasing strength to get the job done.  They are all available over the counter, and you may need to use some, POSSIBLY even all of these options:    Drink plenty of fluids (prune juice may be helpful) and high fiber foods Colace 100 mg by mouth twice a day  Senokot for constipation as directed and as needed Dulcolax (bisacodyl ), take with full glass of water   Miralax  (polyethylene glycol) once or twice a day as needed.  If you have tried all these things and are unable to have a bowel movement in the first 3-4 days after surgery call either your surgeon or your primary doctor.    If you experience loose stools or diarrhea, hold the medications until you stool forms back up.  If your symptoms do not get better within 1 week  or if they get worse, check with your doctor.  If you experience the worst abdominal pain ever or develop nausea or vomiting, please contact the office immediately for further recommendations for treatment.   ITCHING:  If you experience itching with your medications, try taking only a single pain pill, or even half a pain pill at a time.  You can also use Benadryl  over the counter for itching or also to help with sleep.   TED HOSE STOCKINGS:  Use stockings on both legs until for at least 2 weeks or as directed by physician office. They may be removed at night for sleeping.  MEDICATIONS:  See your medication summary on the "After Visit Summary" that nursing will review with you.  You may have some home medications which will be placed on hold until you complete the course of blood thinner medication.  It is important for you to complete the blood thinner medication as prescribed.  PRECAUTIONS:  If you experience chest pain or shortness of breath - call 911 immediately for transfer to the hospital emergency department.    If you develop a fever greater that 101 F, purulent drainage from wound, increased redness or drainage from wound, foul odor from the wound/dressing, or calf pain - CONTACT YOUR SURGEON.                                                   FOLLOW-UP APPOINTMENTS:  If you do not already have a post-op appointment, please call the office for an appointment to be seen by your surgeon.  Guidelines for how soon to be seen are listed in your "After Visit Summary", but are typically between 1-4 weeks after surgery.  OTHER INSTRUCTIONS:   Knee Replacement:  Do not place pillow under knee, focus on keeping the knee straight while resting. CPM instructions: 0-90 degrees, 2 hours in the morning, 2 hours in the afternoon, and 2 hours in the evening. Place foam block, curve side up under heel at all times except when in CPM or when walking.  DO NOT modify, tear, cut, or change the foam block in any way.  POST-OPERATIVE OPIOID TAPER INSTRUCTIONS: It is important to wean off of your opioid medication as soon as possible. If you do not need pain medication after your surgery it is ok to stop day one. Opioids include: Codeine, Hydrocodone(Norco, Vicodin), Oxycodone (Percocet, oxycontin ) and hydromorphone  amongst others.  Long term and even short term use of opiods can cause: Increased pain response Dependence Constipation Depression Respiratory depression And more.  Withdrawal symptoms can include Flu like symptoms Nausea, vomiting And more Techniques to manage these symptoms Hydrate well Eat regular healthy meals Stay active Use relaxation techniques(deep breathing, meditating, yoga) Do Not substitute Alcohol to help with tapering If you have been on opioids for less than two weeks and do not have pain than it is ok to stop all together.  Plan to wean off of opioids This plan should start within one week post op of your joint replacement. Maintain the same interval or time between taking each dose  and first decrease the dose.  Cut the total daily intake of opioids by one tablet each day Next start to increase the time between doses. The last dose that should be eliminated is the evening dose.   MAKE SURE YOU:  Understand these instructions.  Get help right away if you are not doing well or get worse.    Thank you for letting us  be a part of your medical care team.  It is a privilege we respect greatly.  We hope these instructions will help you stay on track for a fast and full recovery!     Dental Antibiotics:  In most cases prophylactic antibiotics for Dental procdeures after total joint surgery are not necessary.  Exceptions are as follows:  1. History of prior total joint infection  2. Severely immunocompromised (Organ Transplant, cancer chemotherapy, Rheumatoid biologic meds such as Humera)  3. Poorly controlled diabetes (A1C &gt; 8.0, blood glucose over 200)  If you have one of these conditions, contact your surgeon for an antibiotic prescription, prior to your dental procedure.

## 2024-04-22 NOTE — Evaluation (Signed)
 Physical Therapy Evaluation Patient Details Name: Brooke Cantrell MRN: [id number] DOB: 1946-02-07 Today's Date: 04/22/2024  History of Present Illness  78 yo female admit to [facility] s/p R TKA on 04/22/2024. PMH: HLD, arthritis, breast cancer (2020), HTN  Clinical Impression  Pt presents with decreased ROM and strength in RLE due to R TKA. Pt to benefit from acute PT to address deficits. Pt ambulated 25 ft with no significant LOB. Pt experienced nausea and vomiting, RN notified. Pt educated on ankle pumps (20/hour) to perform this afternoon/evening to increase circulation, to pt's tolerance and limited by pain. Pt expected to progress functional mobility with further PT. PT to progress mobility as tolerated, and will continue to follow acutely.          If plan is discharge home, recommend the following: A lot of help with walking and/or transfers;A little help with bathing/dressing/bathroom;Assist for transportation;Assistance with cooking/housework;Help with stairs or ramp for entrance   Can travel by private vehicle        Equipment Recommendations None recommended by PT  Recommendations for Other Services       Functional Status Assessment Patient has had a recent decline in their functional status and demonstrates the ability to make significant improvements in function in a reasonable and predictable amount of time.     Precautions / Restrictions Precautions Precautions: Fall Restrictions Weight Bearing Restrictions Per Provider Order: Yes RLE Weight Bearing Per Provider Order: Weight bearing as tolerated      Mobility  Bed Mobility Overal bed mobility: Needs Assistance Bed Mobility: Supine to Sit     Supine to sit: HOB elevated, Min assist     General bed mobility comments: RLE management towards L EOB    Transfers Overall transfer level: Needs assistance Equipment used: Rolling walker (2 wheels) Transfers: Sit to/from Stand Sit to Stand: Min assist            General transfer comment: Assist for power up and steady. Cues for hand placement for RW    Ambulation/Gait Ambulation/Gait assistance: Min assist Gait Distance (Feet): 25 Feet Assistive device: Rolling walker (2 wheels) Gait Pattern/deviations: Step-to pattern, Decreased stride length, Antalgic, Decreased stance time - right       General Gait Details: Initial R knee block but pt progressed. Cues for walker management  Stairs            Wheelchair Mobility     Tilt Bed    Modified Rankin (Stroke Patients Only)       Balance Overall balance assessment: Needs assistance Sitting-balance support: Bilateral upper extremity supported, Feet supported Sitting balance-Leahy Scale: Fair     Standing balance support: Bilateral upper extremity supported, During functional activity, Reliant on assistive device for balance Standing balance-Leahy Scale: Fair Standing balance comment: Reliant on RW                             Pertinent Vitals/Pain Pain Assessment Pain Assessment: Faces Faces Pain Scale: Hurts whole lot Pain Location: R Knee Pain Descriptors / Indicators: Aching, Discomfort, Guarding Pain Intervention(s): Monitored during session, Limited activity within patient's tolerance    Home Living Family/patient expects to be discharged to:: Private residence Living Arrangements: Spouse/significant other Available Help at Discharge: Family;Available 24 hours/day Type of Home: House Home Access: Stairs to enter Entrance Stairs-Rails: Left Entrance Stairs-Number of Steps: 3   Home Layout: Two level;Able to live on main level with bedroom/bathroom Home Equipment: Warden/ranger (  2 wheels);Rollator (4 wheels);Grab bars - toilet;Grab bars - tub/shower      Prior Function Prior Level of Function : Independent/Modified Independent                     Extremity/Trunk Assessment   Upper Extremity Assessment Upper Extremity  Assessment: Overall WFL for tasks assessed    Lower Extremity Assessment Lower Extremity Assessment: RLE deficits/detail RLE Deficits / Details: R TKR (assessed post-op ROM, strength. Pt able to perform ankle pumps, quad sets, and heel slides to ~35 degrees)    Cervical / Trunk Assessment Cervical / Trunk Assessment: Normal  Communication   Communication Communication: No apparent difficulties    Cognition Arousal: Alert Behavior During Therapy: WFL for tasks assessed/performed   PT - Cognitive impairments: No apparent impairments                         Following commands: Intact       Cueing Cueing Techniques: Verbal cues, Tactile cues, Gestural cues     General Comments General comments (skin integrity, edema, etc.): VSS on RA. Nausea and dizziness after sitting up. Pt vomitted and felt better afterwards    Exercises Total Joint Exercises Ankle Circles/Pumps: AROM, 5 reps, Seated, Right Quad Sets: AROM, 5 reps, Right, Supine Heel Slides: AROM, Right, 5 reps, Supine   Assessment/Plan    PT Assessment Patient needs continued PT services  PT Problem List Decreased strength;Decreased range of motion;Decreased balance;Decreased mobility       PT Treatment Interventions DME instruction;Gait training;Stair training;Functional mobility training;Therapeutic activities    PT Goals (Current goals can be found in the Care Plan section)  Acute Rehab PT Goals Patient Stated Goal: Return back to daily activities PT Goal Formulation: With patient/family Time For Goal Achievement: 05/06/24 Potential to Achieve Goals: Good    Frequency 7X/week     Co-evaluation               AM-PAC PT 6 Clicks Mobility  Outcome Measure Help needed turning from your back to your side while in a flat bed without using bedrails?: A Little Help needed moving from lying on your back to sitting on the side of a flat bed without using bedrails?: A Little Help needed moving to  and from a bed to a chair (including a wheelchair)?: A Little Help needed standing up from a chair using your arms (e.g., wheelchair or bedside chair)?: A Little Help needed to walk in hospital room?: A Lot Help needed climbing 3-5 steps with a railing? : Total 6 Click Score: 15    End of Session Equipment Utilized During Treatment: Gait belt Activity Tolerance: Patient tolerated treatment well Patient left: in bed;with call bell/phone within reach;with bed alarm set;with family/visitor present Nurse Communication: Mobility status PT Visit Diagnosis: Other abnormalities of gait and mobility (R26.89);Muscle weakness (generalized) (M62.81)    Time: 8552-8470 PT Time Calculation (min) (ACUTE ONLY): 42 min   Charges:   PT Evaluation $PT Eval Low Complexity: 1 Low PT Treatments $Gait Training: 8-22 mins PT General Charges $$ ACUTE PT VISIT: 1 Visit         Quintin Campi, SPT  Acute Rehab  [phone]   Quintin Campi 04/22/2024, 3:54 PM

## 2024-04-22 NOTE — Anesthesia Postprocedure Evaluation (Signed)
 Anesthesia Post Note  Patient: Brooke Cantrell  Procedure(s) Performed: ARTHROPLASTY, KNEE, TOTAL (Right: Knee)     Patient location during evaluation: PACU Anesthesia Type: Spinal Level of consciousness: awake and alert Pain management: pain level controlled Vital Signs Assessment: post-procedure vital signs reviewed and stable Respiratory status: spontaneous breathing, nonlabored ventilation, respiratory function stable and patient connected to nasal cannula oxygen Cardiovascular status: blood pressure returned to baseline and stable Postop Assessment: no apparent nausea or vomiting and spinal receding Anesthetic complications: no   No notable events documented.  Last Vitals:  Vitals:   04/22/24 1145 04/22/24 1217  BP: (!) 166/77 (!) 164/87  Pulse: (!) 54 62  Resp: 18 17  Temp: 36.6 C 36.5 C  SpO2: 99% 98%    Last Pain:  Vitals:   04/22/24 1241  TempSrc:   PainSc: 10-Worst pain ever                 Rome Ade

## 2024-04-22 NOTE — Anesthesia Preprocedure Evaluation (Signed)
 Anesthesia Evaluation  Patient identified by MRN, date of birth, ID band Patient awake    Reviewed: Allergy & Precautions, NPO status , Patient's Chart, lab work & pertinent test results  History of Anesthesia Complications Negative for: history of anesthetic complications  Airway Mallampati: II  TM Distance: >3 FB Neck ROM: Full    Dental no notable dental hx. (+) Teeth Intact   Pulmonary neg pulmonary ROS, neg sleep apnea, neg COPD, Patient abstained from smoking.Not current smoker, former smoker   Pulmonary exam normal breath sounds clear to auscultation       Cardiovascular Exercise Tolerance: Good METShypertension, Pt. on medications (-) CAD and (-) Past MI (-) dysrhythmias  Rhythm:Regular Rate:Normal - Systolic murmurs    Neuro/Psych  PSYCHIATRIC DISORDERS  Depression    negative neurological ROS     GI/Hepatic ,neg GERD  ,,(+)     (-) substance abuse    Endo/Other  neg diabetes    Renal/GU negative Renal ROS     Musculoskeletal   Abdominal   Peds  Hematology Denies blood thinner use or bleeding disorders.    Anesthesia Other Findings Denies blood thinner use or bleeding diatheses. Recent labs reviewed. Past Medical History: No date: Arthritis No date: Breast cancer (HCC)     Comment:  Left breast No date: Depression     Comment:  mild, no meds No date: History of kidney stones No date: Hypertension   Reproductive/Obstetrics                              Anesthesia Physical Anesthesia Plan  ASA: 2  Anesthesia Plan: Spinal   Post-op Pain Management: Tylenol  PO (pre-op)* and Regional block*   Induction: Intravenous  PONV Risk Score and Plan: 3 and 2 and Ondansetron , Dexamethasone , Propofol  infusion and TIVA  Airway Management Planned: Natural Airway  Additional Equipment: None  Intra-op Plan:   Post-operative Plan:   Informed Consent: I have reviewed the  patients History and Physical, chart, labs and discussed the procedure including the risks, benefits and alternatives for the proposed anesthesia with the patient or authorized representative who has indicated his/her understanding and acceptance.       Plan Discussed with: CRNA and Surgeon  Anesthesia Plan Comments: (Discussed R/B/A of neuraxial anesthesia technique with patient: - rare risks of spinal/epidural hematoma, nerve damage, infection - Risk of PDPH - Risk of nausea and vomiting - Risk of conversion to general anesthesia and its associated risks, including sore throat, damage to lips/eyes/teeth/oropharynx, and rare risks such as cardiac and respiratory events. - Risk of allergic reactions  Discussed r/b/a of adductor canal nerve block, including:  - bleeding, infection, nerve damage - poor or non functioning block. - reactions and toxicity to local anesthetic Patient understands.   Discussed the role of CRNA in patient's perioperative care.  Patient voiced understanding.)        Anesthesia Quick Evaluation

## 2024-04-23 ENCOUNTER — Encounter (HOSPITAL_COMMUNITY): Payer: Self-pay | Admitting: Orthopaedic Surgery

## 2024-04-23 DIAGNOSIS — M1711 Unilateral primary osteoarthritis, right knee: Secondary | ICD-10-CM | POA: Diagnosis not present

## 2024-04-23 LAB — BASIC METABOLIC PANEL WITH GFR
Anion gap: 11 (ref 5–15)
BUN: 20 mg/dL (ref 8–23)
CO2: 27 mmol/L (ref 22–32)
Calcium: 9.2 mg/dL (ref 8.9–10.3)
Chloride: 98 mmol/L (ref 98–111)
Creatinine, Ser: 0.84 mg/dL (ref 0.44–1.00)
GFR, Estimated: >60 mL/min (ref >60)
Glucose, Bld: 153 mg/dL — ABNORMAL HIGH (ref 70–99)
Potassium: 4 mmol/L (ref 3.5–5.1)
Sodium: 136 mmol/L (ref 135–145)

## 2024-04-23 LAB — CBC
HCT: 37 % (ref 36.0–46.0)
Hemoglobin: 12.3 g/dL (ref 12.0–15.0)
MCH: 31 pg (ref 26.0–34.0)
MCHC: 33.2 g/dL (ref 30.0–36.0)
MCV: 93.2 fL (ref 80.0–100.0)
Platelets: 260 K/uL (ref 150–400)
RBC: 3.97 MIL/uL (ref 3.87–5.11)
RDW: 12.8 % (ref 11.5–15.5)
WBC: 16.8 K/uL — ABNORMAL HIGH (ref 4.0–10.5)
nRBC: 0 % (ref 0.0–0.2)

## 2024-04-23 MED ORDER — METHOCARBAMOL 500 MG PO TABS: 500.0000 mg | ORAL_TABLET | Freq: Four times a day (QID) | ORAL | 0 refills | Status: DC | PRN

## 2024-04-23 MED ORDER — OXYCODONE HCL 5 MG PO TABS: 5.0000 mg | ORAL_TABLET | Freq: Four times a day (QID) | ORAL | 0 refills | Status: DC | PRN

## 2024-04-23 MED ORDER — ASPIRIN 81 MG PO CHEW: 81.0000 mg | CHEWABLE_TABLET | Freq: Two times a day (BID) | ORAL | 0 refills | Status: AC

## 2024-04-23 NOTE — Care Management Obs Status (Signed)
 MEDICARE OBSERVATION STATUS NOTIFICATION   Patient Details  Name: Brooke Cantrell MRN: [id number] Date of Birth: 08-05-1946   Medicare Observation Status Notification Given:  Yes    Jon Cruel 04/23/2024, 10:09 AM

## 2024-04-23 NOTE — Discharge Summary (Signed)
 Patient ID: JAYLENN BAIZA MRN: [id number] DOB/AGE: 10-11-1945 78 y.o.  Admit date: 04/22/2024 Discharge date: 04/23/2024  Admission Diagnoses:  Principal Problem:   Unilateral primary osteoarthritis, right knee Active Problems:   Status post total right knee replacement   Discharge Diagnoses:  Same  Past Medical History:  Diagnosis Date   Arthritis    Breast cancer (HCC)    Left breast   Depression    mild, no meds   History of kidney stones    Hypertension     Surgeries: Procedure(s): ARTHROPLASTY, KNEE, TOTAL on 04/22/2024   Consultants:   Discharged Condition: Improved  Hospital Course: MALKIE WILLE is an 78 y.o. female who was admitted 04/22/2024 for operative treatment ofUnilateral primary osteoarthritis, right knee. Patient has severe unremitting pain that affects sleep, daily activities, and work/hobbies. After pre-op clearance the patient was taken to the operating room on 04/22/2024 and underwent  Procedure(s): ARTHROPLASTY, KNEE, TOTAL.    Patient was given perioperative antibiotics:  Anti-infectives (From admission, onward)    Start     Dose/Rate Route Frequency Ordered Stop   04/22/24 1315  nitrofurantoin  (macrocrystal-monohydrate) (MACROBID ) capsule 100 mg        100 mg Oral 2 times daily 04/22/24 1222     04/22/24 1315  ceFAZolin  (ANCEF ) IVPB 2g/100 mL premix        2 g 200 mL/hr over 30 Minutes Intravenous Every 6 hours 04/22/24 1222 04/22/24 1847   04/22/24 0715  ceFAZolin  (ANCEF ) IVPB 2g/100 mL premix        2 g 200 mL/hr over 30 Minutes Intravenous On call to O.R. 04/22/24 0700 04/22/24 0851        Patient was given sequential compression devices, early ambulation, and chemoprophylaxis to prevent DVT.  Inpatient Morphine  Milligram Equivalents Per Day 8/12 - 8/13   Values displayed are in units of MME/Day    Order Start / End Date Yesterday Today    oxyCODONE  (Oxy IR/ROXICODONE ) immediate release tablet 5 mg 8/12 - 8/12 0 of Unknown --     oxyCODONE  (ROXICODONE ) 5 MG/5ML solution 5 mg 8/12 - 8/12 0 of Unknown --      Group total: 0 of Unknown     fentaNYL  (SUBLIMAZE ) 100 MCG/2ML injection 8/12 - 8/12 0 of Unknown --    fentaNYL  (SUBLIMAZE ) injection 25-50 mcg 8/12 - 8/12 30 of 45-90 --    fentaNYL  (SUBLIMAZE ) injection 50 mcg 8/12 - 8/12 15 of 15 --    fentaNYL  citrate (PF) (SUBLIMAZE ) injection 8/12 - 8/12 *15 of 15 --    Daily Totals  * 60 of Unknown (at least 75-120) --  *One-Step medication  Calculation Errors     Order Type Date Details   fentaNYL  (SUBLIMAZE ) 100 MCG/2ML injection Ordered Dose -- Frequency type could not be determined   oxyCODONE  (Oxy IR/ROXICODONE ) immediate release tablet 5 mg Ordered Dose -- Insufficient frequency information   oxyCODONE  (ROXICODONE ) 5 MG/5ML solution 5 mg Ordered Dose -- Insufficient frequency information            Patient benefited maximally from hospital stay and there were no complications.    Recent vital signs: Patient Vitals for the past 24 hrs:  BP Temp Temp src Pulse Resp SpO2  04/23/24 0818 132/62 98.3 F (36.8 C) Oral 84 16 92 %  04/23/24 0327 (!) 158/67 98.9 F (37.2 C) Oral 90 19 93 %  04/22/24 2305 (!) 145/83 98.9 F (37.2 C) Oral 75 19 94 %  04/22/24 2008 (!)  143/69 98.3 F (36.8 C) Oral 72 18 96 %  04/22/24 1610 (!) 179/81 97.7 F (36.5 C) Oral 64 17 97 %     Recent laboratory studies:  Recent Labs    04/23/24 0615  WBC 16.8*  HGB 12.3  HCT 37.0  PLT 260  NA 136  K 4.0  CL 98  CO2 27  BUN 20  CREATININE 0.84  GLUCOSE 153*  CALCIUM  9.2     Discharge Medications:   Allergies as of 04/23/2024       Reactions   Ciprofloxacin Itching, Rash, Hives        Medication List     TAKE these medications    aspirin  81 MG chewable tablet Chew 1 tablet (81 mg total) by mouth 2 (two) times daily.   calcium  citrate-vitamin D  315-200 MG-UNIT tablet Commonly known as: CITRACAL+D Take 2 tablets by mouth 2 (two) times daily with a meal.    chlorthalidone  25 MG tablet Commonly known as: HYGROTON  Take 25 mg by mouth daily.   cholecalciferol  25 MCG (1000 UNIT) tablet Commonly known as: VITAMIN D3 Take 1,000 Units by mouth in the morning and at bedtime.   ezetimibe  10 MG tablet Commonly known as: ZETIA  Take 10 mg by mouth daily.   MANNXTRA PO Take 1 Dose by mouth daily.   methocarbamol  500 MG tablet Commonly known as: ROBAXIN  Take 1 tablet (500 mg total) by mouth every 6 (six) hours as needed for muscle spasms.   multivitamin capsule Take 1 capsule by mouth daily.   nitrofurantoin  (macrocrystal-monohydrate) 100 MG capsule Commonly known as: MACROBID  Take 1 capsule (100 mg total) by mouth 2 (two) times daily.   OMEGA 3 PO Take 1 capsule by mouth daily.   oxyCODONE  5 MG immediate release tablet Commonly known as: Oxy IR/ROXICODONE  Take 1-2 tablets (5-10 mg total) by mouth every 6 (six) hours as needed for moderate pain (pain score 4-6) (pain score 4-6).               Durable Medical Equipment  (From admission, onward)           Start     Ordered   04/22/24 1223  DME 3 n 1  Once        04/22/24 1222   04/22/24 1223  DME Walker rolling  Once       Question Answer Comment  Walker: With 5 Inch Wheels   Patient needs a walker to treat with the following condition Status post total right knee replacement      04/22/24 1222            Diagnostic Studies: DG Knee Right Port Result Date: 04/22/2024 CLINICAL DATA:  Status post right knee replacement. EXAM: PORTABLE RIGHT KNEE - 1-2 VIEW COMPARISON:  None Available. FINDINGS: Right knee arthroplasty in expected alignment. No periprosthetic lucency or fracture. There has been patellar resurfacing. Recent postsurgical change includes air and edema in the soft tissues and joint space. Anterior skin staples in place. IMPRESSION: Right knee arthroplasty without immediate postoperative complication. Electronically Signed   By: Andrea Gasman M.D.   On:  04/22/2024 14:21    Disposition: Discharge disposition: 01-Home or Self Care          Follow-up Information     [facility] Follow up.   Specialty: Home Health Services Why: [facility] will contact you for the first home visit Contact information: [address] [place] [address] [phone]  Vernetta Lonni GRADE, MD Follow up in 2 week(s).   Specialty: Orthopedic Surgery Contact information: [address] [place]  [place] [address] [phone]                  Signed: Lonni GRADE Vernetta 04/23/2024, 2:40 PM

## 2024-04-23 NOTE — Progress Notes (Signed)
 Subjective: 1 Day Post-Op Procedure(s) (LRB): ARTHROPLASTY, KNEE, TOTAL (Right) Patient reports pain as moderate.    Objective: Vital signs in last 24 hours: Temp:  [97.6 F (36.4 C)-98.9 F (37.2 C)] 98.9 F (37.2 C) (08/13 0327) Pulse Rate:  [54-90] 90 (08/13 0327) Resp:  [11-22] 19 (08/13 0327) BP: (127-179)/(66-87) 158/67 (08/13 0327) SpO2:  [93 %-99 %] 93 % (08/13 0327)  Intake/Output from previous day: 08/12 0701 - 08/13 0700 In: 1640 [P.O.:840; I.V.:800] Out: 710 [Urine:710] Intake/Output this shift: No intake/output data recorded.  Recent Labs    04/23/24 0615  HGB 12.3   Recent Labs    04/23/24 0615  WBC 16.8*  RBC 3.97  HCT 37.0  PLT 260   Recent Labs    04/23/24 0615  NA 136  K 4.0  CL 98  CO2 27  BUN 20  CREATININE 0.84  GLUCOSE 153*  CALCIUM  9.2   No results for input(s): LABPT, INR in the last 72 hours.  Sensation intact distally Intact pulses distally Dorsiflexion/Plantar flexion intact Incision: dressing C/D/I Compartment soft   Assessment/Plan: 1 Day Post-Op Procedure(s) (LRB): ARTHROPLASTY, KNEE, TOTAL (Right) Up with therapy Discharge home with home health      Brooke Cantrell 04/23/2024, 7:18 AM

## 2024-04-23 NOTE — Plan of Care (Signed)
Pt and husband given D/C instructions with verbal understanding. Rx's were sent to the pharmacy by MD. Pt's incision is clean and dry with no sign of infection. Pt's IV was removed prior to D/C. Pt D/C'd home via wheelchair per MD order. Pt is stable @ D/C and has no other needs at this time. Elisha Mcgruder, RN 

## 2024-04-23 NOTE — Progress Notes (Addendum)
 Physical Therapy Treatment Patient Details Name: Brooke Cantrell MRN: [id number] DOB: 07-Dec-1945 Today's Date: 04/23/2024   History of Present Illness 78 yo female admit to [facility] s/p R TKA on 04/22/2024. PMH: HLD, arthritis, breast cancer (2020), HTN    PT Comments  Pt with improved nausea today; still has somewhat decreased pain control. Pt able to perform HEP for R knee/hip strengthening and ROM, ambulate 90 ft with a walker with a step to gait pattern, and negotiate 3 steps with min assist. Pt tends to walk on R forefoot, and encouraged keeping foot flat and weightbearing as tolerated. Pt with some difficulty managing the stairs; plan to review with husband in PM session and pt also has option of a ramp to enter/exit home. Will benefit from follow up HHPT initially.  Addendum 1336: Discussed with pt/pt spouse; they plan to use ramp and w/c to enter home and will practice steps with HHPT   If plan is discharge home, recommend the following: A little help with bathing/dressing/bathroom;Assist for transportation;Assistance with cooking/housework;Help with stairs or ramp for entrance;A little help with walking and/or transfers   Can travel by private vehicle        Equipment Recommendations  None recommended by PT    Recommendations for Other Services       Precautions / Restrictions Precautions Precautions: Fall Restrictions Weight Bearing Restrictions Per Provider Order: Yes RLE Weight Bearing Per Provider Order: Weight bearing as tolerated     Mobility  Bed Mobility Overal bed mobility: Needs Assistance Bed Mobility: Supine to Sit     Supine to sit: Supervision     General bed mobility comments: Pt able to use arms to maneuver RLE off edge of bed. Increased time    Transfers Overall transfer level: Needs assistance Equipment used: Rolling walker (2 wheels) Transfers: Sit to/from Stand Sit to Stand: Supervision                Ambulation/Gait Ambulation/Gait  assistance: Contact guard assist Gait Distance (Feet): 90 Feet Assistive device: Rolling walker (2 wheels) Gait Pattern/deviations: Step-to pattern, Decreased stride length, Antalgic, Decreased stance time - right, Decreased dorsiflexion - right Gait velocity: decreased Gait velocity interpretation: <1.31 ft/sec, indicative of household ambulator   General Gait Details: Verbal cues for sequencing/technique, keeping R foot flat (tendency to walk on forefoot), and decreased R step length   Stairs Stairs: Yes Stairs assistance: Min assist Stair Management: One rail Left, Backwards, Sideways, With walker Number of Stairs: 4 General stair comments: Ascended 3 steps with L rail bilat hand support, minA for support/balance. Verbal/tactile cues for sequencing. Ascended 1 step with walker backwards and descended forwards   Wheelchair Mobility     Tilt Bed    Modified Rankin (Stroke Patients Only)       Balance Overall balance assessment: Needs assistance Sitting-balance support: Feet supported Sitting balance-Leahy Scale: Fair     Standing balance support: Bilateral upper extremity supported, During functional activity, Reliant on assistive device for balance Standing balance-Leahy Scale: Poor                              Communication Communication Communication: No apparent difficulties  Cognition Arousal: Alert Behavior During Therapy: WFL for tasks assessed/performed   PT - Cognitive impairments: No apparent impairments                         Following commands: Intact  Cueing Cueing Techniques: Verbal cues  Exercises Total Joint Exercises Quad Sets: Right, 5 reps, Supine, AROM Short Arc Quad: AROM, Right, 5 reps, Supine Heel Slides: AROM, Right, 5 reps, Supine Hip ABduction/ADduction: AROM, Right, 5 reps, Supine Long Arc Quad: AROM, Right, 5 reps, Supine Goniometric ROM: 10-70 degrees    General Comments        Pertinent  Vitals/Pain Pain Assessment Pain Assessment: Faces Faces Pain Scale: Hurts even more Pain Location: R Knee Pain Descriptors / Indicators: Aching, Discomfort, Guarding Pain Intervention(s): Limited activity within patient's tolerance, Monitored during session, Premedicated before session, Ice applied    Home Living                          Prior Function            PT Goals (current goals can now be found in the care plan section) Acute Rehab PT Goals Patient Stated Goal: Return back to daily activities Potential to Achieve Goals: Good Progress towards PT goals: Progressing toward goals    Frequency    7X/week      PT Plan      Co-evaluation              AM-PAC PT 6 Clicks Mobility   Outcome Measure  Help needed turning from your back to your side while in a flat bed without using bedrails?: None Help needed moving from lying on your back to sitting on the side of a flat bed without using bedrails?: A Little Help needed moving to and from a bed to a chair (including a wheelchair)?: A Little Help needed standing up from a chair using your arms (e.g., wheelchair or bedside chair)?: A Little Help needed to walk in hospital room?: A Little Help needed climbing 3-5 steps with a railing? : A Little 6 Click Score: 19    End of Session Equipment Utilized During Treatment: Gait belt Activity Tolerance: Patient tolerated treatment well Patient left: in chair;with call bell/phone within reach Nurse Communication: Mobility status PT Visit Diagnosis: Other abnormalities of gait and mobility (R26.89);Muscle weakness (generalized) (M62.81)     Time: 9165-9075 PT Time Calculation (min) (ACUTE ONLY): 50 min  Charges:    $Gait Training: 23-37 mins $Therapeutic Exercise: 8-22 mins PT General Charges $$ ACUTE PT VISIT: 1 Visit                     Aleck Cantrell, PT, DPT Acute Rehabilitation Services Office [phone]    Brooke Cantrell 04/23/2024,  10:41 AM

## 2024-04-28 ENCOUNTER — Other Ambulatory Visit: Payer: Self-pay | Admitting: Surgical

## 2024-04-28 ENCOUNTER — Telehealth: Payer: Self-pay | Admitting: Orthopaedic Surgery

## 2024-04-28 ENCOUNTER — Telehealth: Payer: Self-pay | Admitting: Surgical

## 2024-04-28 MED ORDER — OXYCODONE HCL 5 MG PO TABS: 5.0000 mg | ORAL_TABLET | Freq: Four times a day (QID) | ORAL | 0 refills | Status: DC | PRN

## 2024-04-28 NOTE — Telephone Encounter (Signed)
 Walterine Primes this pt need a refill of oxycodone  and Vernetta is on vacation. Sari ask me to forward this because you are the on call dr. Damien you please send a refill in for this pt. Pt husband name Evalene is [phone].

## 2024-04-28 NOTE — Telephone Encounter (Signed)
 Pt's husband called stating pt will be out of pain medication today and need a refill of oxycodone . Please send to pharmacy on file. Pt phone number is [phone].

## 2024-04-28 NOTE — Telephone Encounter (Signed)
 Sent in

## 2024-05-01 ENCOUNTER — Other Ambulatory Visit: Payer: Self-pay | Admitting: Orthopaedic Surgery

## 2024-05-01 ENCOUNTER — Telehealth: Payer: Self-pay | Admitting: Orthopaedic Surgery

## 2024-05-01 MED ORDER — OXYCODONE HCL 5 MG PO TABS: 5.0000 mg | ORAL_TABLET | Freq: Four times a day (QID) | ORAL | 0 refills | Status: DC | PRN

## 2024-05-01 NOTE — Telephone Encounter (Signed)
 Patient called and needs a refill on Oxycodone . CB#[phone]

## 2024-05-02 ENCOUNTER — Telehealth: Payer: Self-pay | Admitting: Orthopaedic Surgery

## 2024-05-02 ENCOUNTER — Telehealth: Payer: Self-pay | Admitting: Orthopedic Surgery

## 2024-05-02 NOTE — Telephone Encounter (Signed)
 Pt's husband Evalene called requesting a refill of methocarbamol . Please send to [facility] [place]. Phone number to pt is [phone].

## 2024-05-02 NOTE — Telephone Encounter (Signed)
 Patient called and said that the pharmacy is saying that they can't refill the medication because its too soon. The records that the pharmacy have is wrong and She needs someone to call the pharmacy and find out. CB#[phone]

## 2024-05-02 NOTE — Telephone Encounter (Signed)
 Patient aware unfortunately her insurance will not fill it until morning. It has to last 5 days atleast

## 2024-05-03 ENCOUNTER — Other Ambulatory Visit: Payer: Self-pay | Admitting: Orthopaedic Surgery

## 2024-05-03 MED ORDER — METHOCARBAMOL 500 MG PO TABS: 500.0000 mg | ORAL_TABLET | Freq: Four times a day (QID) | ORAL | 0 refills | Status: DC | PRN

## 2024-05-07 ENCOUNTER — Encounter: Payer: Self-pay | Admitting: Orthopaedic Surgery

## 2024-05-07 ENCOUNTER — Ambulatory Visit (INDEPENDENT_AMBULATORY_CARE_PROVIDER_SITE_OTHER): Admitting: Orthopaedic Surgery

## 2024-05-07 DIAGNOSIS — Z96651 Presence of right artificial knee joint: Secondary | ICD-10-CM

## 2024-05-07 MED ORDER — TRAMADOL HCL 50 MG PO TABS: 50.0000 mg | ORAL_TABLET | Freq: Four times a day (QID) | ORAL | 0 refills | Status: DC | PRN

## 2024-05-07 NOTE — Progress Notes (Signed)
 The patient is a 78 year old female who is here today for her first postoperative visit status post her right total knee replacement to treat significant right knee pain and arthritis.  She is ambulating with a rolling walker.  She has been compliant with a baby aspirin  twice daily as well as wearing compressive hose.  She has had an issue in terms of how the narcotics for prescribing refilled.  There was some issues with our office sending him in as well as pharmacy refilling them.  Now she is only taking Tylenol .  Her husband is with her as well.  He did express his frustration with how the medications were handled and I totally understand that.  On examination today her right calf is soft.  She lacks full extension by 3 to 5 degrees and I can only flex her to maybe 70 degrees.  The incision looks good.  Staples are removed and Steri-Strips applied.  I would like her to try at least tramadol  as a pain medication and we will send this in to her pharmacy.  Will see about setting her up for outpatient physical therapy soon as possible starting upstairs here and she needs to push this multiple times throughout the day in terms of getting her knee moving and flexing as well as extending.  We will see her back in a month to see how she is doing overall but no x-rays are needed.

## 2024-05-08 ENCOUNTER — Other Ambulatory Visit: Payer: Self-pay

## 2024-05-08 DIAGNOSIS — Z96651 Presence of right artificial knee joint: Secondary | ICD-10-CM

## 2024-05-09 ENCOUNTER — Telehealth: Payer: Self-pay | Admitting: Orthopaedic Surgery

## 2024-05-09 ENCOUNTER — Ambulatory Visit (INDEPENDENT_AMBULATORY_CARE_PROVIDER_SITE_OTHER): Admitting: Rehabilitative and Restorative Service Providers"

## 2024-05-09 ENCOUNTER — Encounter: Payer: Self-pay | Admitting: Rehabilitative and Restorative Service Providers"

## 2024-05-09 DIAGNOSIS — R262 Difficulty in walking, not elsewhere classified: Secondary | ICD-10-CM | POA: Diagnosis not present

## 2024-05-09 DIAGNOSIS — M25561 Pain in right knee: Secondary | ICD-10-CM

## 2024-05-09 DIAGNOSIS — R600 Localized edema: Secondary | ICD-10-CM

## 2024-05-09 DIAGNOSIS — M6281 Muscle weakness (generalized): Secondary | ICD-10-CM | POA: Diagnosis not present

## 2024-05-09 DIAGNOSIS — M25661 Stiffness of right knee, not elsewhere classified: Secondary | ICD-10-CM | POA: Diagnosis not present

## 2024-05-09 NOTE — Telephone Encounter (Signed)
 Patient was here. Would like a handicap placard. Call when it's ready and she will pick it up.

## 2024-05-09 NOTE — Therapy (Signed)
 OUTPATIENT PHYSICAL THERAPY LOWER EXTREMITY EVALUATION   Patient Name: Brooke Cantrell MRN: [id number] DOB:1946/09/01, 78 y.o., female Today's Date: 05/09/2024  END OF SESSION:  PT End of Session - 05/09/24 1445     Visit Number 1    Number of Visits 24    Date for PT Re-Evaluation 08/01/24    Authorization Type MEDICARE    Progress Note Due on Visit 10    PT Start Time 1346    PT Stop Time 1435    PT Time Calculation (min) 49 min    Activity Tolerance Patient tolerated treatment well;No increased pain;Patient limited by pain    Behavior During Therapy [facility] for tasks assessed/performed          Past Medical History:  Diagnosis Date   Arthritis    Breast cancer (HCC)    Left breast   Depression    mild, no meds   History of kidney stones    Hypertension    Past Surgical History:  Procedure Laterality Date   breast cancer surgery     BREAST ENHANCEMENT SURGERY     BREAST LUMPECTOMY Left    CATARACT EXTRACTION, BILATERAL     ECTOPIC PREGNANCY SURGERY     EYE SURGERY     INNER EAR SURGERY     TONSILLECTOMY     TOTAL KNEE ARTHROPLASTY Right 04/22/2024   Procedure: ARTHROPLASTY, KNEE, TOTAL;  Surgeon: Vernetta Lonni GRADE, MD;  Location: [facility] OR;  Service: Orthopedics;  Laterality: Right;   TOTAL SHOULDER ARTHROPLASTY Left 04/25/2018   TOTAL SHOULDER ARTHROPLASTY Left 04/25/2018   Procedure: LEFT TOTAL SHOULDER REPLACEMENT;  Surgeon: Addie Cordella Hamilton, MD;  Location: [facility] OR;  Service: Orthopedics;  Laterality: Left;   TUBAL LIGATION     Patient Active Problem List   Diagnosis Date Noted   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   Weakness    Hyperlipidemia    Acute respiratory failure due to COVID-19 (HCC) 06/10/2020   Pneumonia due to COVID-19 virus 06/10/2020   Hx of multiple pulmonary nodules 06/10/2020   Acute respiratory failure with hypoxia (HCC)    Lung nodule seen on imaging study 01/08/2020   History of breast cancer 04/14/2019   Sepsis  (HCC) 04/02/2019   CAP (community acquired pneumonia) 04/02/2019   Elevated troponin 04/02/2019   Shoulder arthritis 04/25/2018   Primary osteoarthritis, 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   Chronic left shoulder pain 05/17/2017   Cervical disc disorder with radiculopathy 08/14/2016   Chronic infection of sinus 08/31/2015   Cough 08/31/2015   Allergic reaction 08/31/2015    PCP: Alda Carpen, MD  REFERRING PROVIDER: Lonni GRADE Vernetta, MD  REFERRING DIAG: [phone] (ICD-10-CM) - Status post total right knee replacement  THERAPY DIAG:  Difficulty in walking, not elsewhere classified - Plan: PT plan of care cert/re-cert  Muscle weakness (generalized) - Plan: PT plan of care cert/re-cert  Localized edema - Plan: PT plan of care cert/re-cert  Stiffness of right knee, not elsewhere classified - Plan: PT plan of care cert/re-cert  Acute pain of right knee - Plan: PT plan of care cert/re-cert  Rationale for Evaluation and Treatment: Rehabilitation  ONSET DATE: 04/22/2024  SUBJECTIVE:   SUBJECTIVE STATEMENT: [name] reports sleep has been better since she started a new pain medication.    PERTINENT HISTORY: Lt breast cancer, kidney stones, HTN, Lt TSA 2019  PAIN:  Are you having pain? Yes: NPRS scale: 1-8/10 this week Pain location: Rt knee Pain description: Stiff, achy, occasional sharp Aggravating  factors: Prolonged postures and too much WB Relieving factors: Pain meds, ice, change positions frequently  PRECAUTIONS: None  RED FLAGS: None   WEIGHT BEARING RESTRICTIONS: No  FALLS:  Has patient fallen in last 6 months? No  LIVING ENVIRONMENT: Lives with: lives with their family and grandson Lives in: House/apartment Stairs: Uses the rail with steps Has following equipment at home: Single point cane and Environmental consultant - 2 wheeled  OCCUPATION: Retired  PLOF: Independent  PATIENT GOALS: Return to gardening her acre lot, weed, plant in the garden  NEXT MD VISIT: 06/04/2024 at  8:30 AM  OBJECTIVE:  Note: Objective measures were completed at Evaluation unless otherwise noted.  DIAGNOSTIC FINDINGS: Right knee arthroplasty in expected alignment. No periprosthetic lucency or fracture. There has been patellar resurfacing. Recent postsurgical change includes air and edema in the soft tissues and joint space. Anterior skin staples in place.  PATIENT SURVEYS:  PSFS: THE PATIENT SPECIFIC FUNCTIONAL SCALE  Place score of 0-10 (0 = unable to perform activity and 10 = able to perform activity at the same level as before injury or problem)  Activity Date: 05/09/2024    Walk 2    2.  Garden 0    3.  Safely transfer to and from the ground 0    4.      Total Score 0.67      Total Score = Sum of activity scores/number of activities  Minimally Detectable Change: 3 points (for single activity); 2 points (for average score)  [name] (nd). The Patient Specific Functional Scale . Retrieved from [email or web address]   COGNITION: Overall cognitive status: Within functional limits for tasks assessed     SENSATION: WFL  EDEMA:  Noted and not objectively assessed   LOWER EXTREMITY ROM:   ROM Left/Right 05/09/2024   Hip flexion    Hip extension    Hip abduction    Hip adduction    Hip internal rotation    Hip external rotation    Knee flexion 145/52   Knee extension 0/7   Ankle dorsiflexion    Ankle plantarflexion    Ankle inversion    Ankle eversion     (Blank rows = not tested)  LOWER EXTREMITY STRENGTH:  In pounds with hand-held dynamometer Left/Right 05/09/2024   Hip flexion    Hip extension    Hip abduction    Hip adduction    Hip internal rotation    Hip external rotation    Knee flexion    Knee extension 49.7/5.9 pounds   Ankle dorsiflexion    Ankle plantarflexion    Ankle inversion    Ankle eversion     (Blank rows = not tested)  GAIT: Distance walked: 50 feet Assistive  device utilized: Walker - 2 wheeled Level of assistance: Modified independence Comments: [name] is using her walker full-time at this point  TREATMENT DATE:  05/09/2024 Quadriceps sets with towel roll under right heel 2 sets of 10 for 5 seconds Seated knee extension and flexion AROM 10 x 5 seconds each direction Seated passive knee flexion (left pushes right into flexion) 10 x 10 seconds  97535: Discussed the emphasis on extension and flexion active range of motion; quadriceps strengthening and edema control early in physical therapy  Vaso right knee 10 minutes medium pressure 34 degrees  PATIENT EDUCATION:  Education details: See above Person educated: Patient Education method: Explanation, Demonstration, Tactile cues, Verbal cues, and Handouts Education comprehension: verbalized understanding, returned demonstration, verbal cues required, tactile cues required, and needs further education  HOME EXERCISE PROGRAM: Access Code: [email or web address] URL: [email or web address] Date: 05/09/2024 Prepared by: Lamar Ivory  Exercises - Seated Knee Flexion Extension AAROM with Overpressure  - 10 x daily - 7 x weekly - 1 sets - 10 reps - 3 seconds hold - Seated Knee Flexion AAROM  - 10 x daily - 7 x weekly - 1 sets - 10 reps - 10 seconds hold - Supine Quadricep Sets  - 10 x daily - 7 x weekly - 10 sets - 10 reps - 5 second hold  ASSESSMENT:  CLINICAL IMPRESSION: Patient is a 78 y.o. female who was seen today for physical therapy evaluation and treatment for Z96.651 (ICD-10-CM) - Status post total right knee replacement.   Active range of motion was 7 - 0 -52 degrees today.  In addition, Jamaris has significant right quadriceps strength impairments, likely due to her recent surgery.  She is aware that active range of motion (extension and flexion), quadriceps strength  and edema control are the early focus of her supervised and home programs.  Her prognosis to meet the below listed goals are good with the recommended plan of care.  OBJECTIVE IMPAIRMENTS: Abnormal gait, decreased activity tolerance, decreased endurance, decreased knowledge of condition, difficulty walking, decreased ROM, decreased strength, increased edema, and pain.   ACTIVITY LIMITATIONS: bending, sitting, standing, squatting, sleeping, stairs, transfers, bed mobility, dressing, and locomotion level  PARTICIPATION LIMITATIONS: community activity and yard work  PERSONAL FACTORS: Lt breast cancer, kidney stones, HTN, Lt TSA 2019 are also affecting patient's functional outcome.   REHAB POTENTIAL: Good  CLINICAL DECISION MAKING: Stable/uncomplicated  EVALUATION COMPLEXITY: Low   GOALS: Goals reviewed with patient? Yes  SHORT TERM GOALS: Target date: 06/20/2024 Ramaya will be independent with her day 1 home exercise program Baseline: Started 05/09/2024 Goal status: INITIAL  2.  Improve right knee active range of motion to 3 - 0 -95 degrees Baseline: 7 - 0 - 52 degrees Goal status: INITIAL  3.  Improve right quadriceps strength to at least 30 pounds Baseline: 5.9 pounds Goal status: INITIAL   LONG TERM GOALS: Target date: 08/01/2024  Improve patient specific functional score to at least 5 Baseline: 0.67 Goal status: INITIAL  2.  Abbey will report right knee pain consistently 3/10 or better on the numeric pain rating scale Baseline: Can be as high as 8/10 Goal status: INITIAL  3.  Improve right knee AROM to 2 - 0 -110 degrees Baseline: 7 - 0 - 52 degrees Goal status: INITIAL  4.  Improve bilateral quadriceps strength to 60 pounds or better Baseline: See objective Goal status: INITIAL  5.  Infiniti will be comfortable without the use of an assistive device within the house and with no more than a single-point cane outside the house at transfer into independent  rehabilitation Baseline: 2 wheeled walker full-time Goal status:  INITIAL  6.  We will be independent with a long-term home maintenance exercise program at discharge Baseline: Started 05/09/2024 Goal status: INITIAL   PLAN:  PT FREQUENCY: 2-3 times a week  PT DURATION: 12 weeks  PLANNED INTERVENTIONS: 97750- Physical Performance Testing, 97110-Therapeutic exercises, 97530- Therapeutic activity, 97112- Neuromuscular re-education, 97535- Self Care, 02859- Manual therapy, [phone]- Gait training, [phone]- Electrical stimulation (unattended), 97016- Vasopneumatic device, Patient/Family education, Balance training, Stair training, Joint mobilization, and Cryotherapy  PLAN FOR NEXT SESSION: Post TKA protocol with early emphasis on active range of motion, edema control and quadriceps strength activation   Myer LELON Ivory, PT, MPT 05/09/2024, 3:10 PM

## 2024-05-12 DIAGNOSIS — M8580 Other specified disorders of bone density and structure, unspecified site: Secondary | ICD-10-CM

## 2024-05-12 DIAGNOSIS — I700 Atherosclerosis of aorta: Secondary | ICD-10-CM

## 2024-05-12 HISTORY — DX: Other specified disorders of bone density and structure, unspecified site: M85.80

## 2024-05-12 HISTORY — DX: Atherosclerosis of aorta: I70.0

## 2024-05-13 ENCOUNTER — Encounter: Payer: Self-pay | Admitting: Physical Therapy

## 2024-05-13 ENCOUNTER — Ambulatory Visit (INDEPENDENT_AMBULATORY_CARE_PROVIDER_SITE_OTHER): Payer: Self-pay | Admitting: Physical Therapy

## 2024-05-13 DIAGNOSIS — R262 Difficulty in walking, not elsewhere classified: Secondary | ICD-10-CM | POA: Diagnosis not present

## 2024-05-13 DIAGNOSIS — R600 Localized edema: Secondary | ICD-10-CM

## 2024-05-13 DIAGNOSIS — M6281 Muscle weakness (generalized): Secondary | ICD-10-CM | POA: Diagnosis not present

## 2024-05-13 DIAGNOSIS — M25661 Stiffness of right knee, not elsewhere classified: Secondary | ICD-10-CM | POA: Diagnosis not present

## 2024-05-13 NOTE — Telephone Encounter (Signed)
 Pt aware this is at front des for her

## 2024-05-13 NOTE — Therapy (Signed)
 OUTPATIENT PHYSICAL THERAPY LOWER EXTREMITY EVALUATION   Patient Name: Brooke Cantrell MRN: [id number] DOB:May 04, 1946, 78 y.o., female Today's Date: 05/13/2024  END OF SESSION:  PT End of Session - 05/13/24 1620     Visit Number 2    Number of Visits 24    Date for PT Re-Evaluation 08/01/24    Authorization Type MEDICARE    Progress Note Due on Visit 10    PT Start Time 1604    PT Stop Time 1652    PT Time Calculation (min) 48 min    Activity Tolerance Patient tolerated treatment well;No increased pain;Patient limited by pain    Behavior During Therapy [facility] for tasks assessed/performed           Past Medical History:  Diagnosis Date   Arthritis    Breast cancer (HCC)    Left breast   Depression    mild, no meds   History of kidney stones    Hypertension    Past Surgical History:  Procedure Laterality Date   breast cancer surgery     BREAST ENHANCEMENT SURGERY     BREAST LUMPECTOMY Left    CATARACT EXTRACTION, BILATERAL     ECTOPIC PREGNANCY SURGERY     EYE SURGERY     INNER EAR SURGERY     TONSILLECTOMY     TOTAL KNEE ARTHROPLASTY Right 04/22/2024   Procedure: ARTHROPLASTY, KNEE, TOTAL;  Surgeon: Vernetta Lonni GRADE, MD;  Location: [facility] OR;  Service: Orthopedics;  Laterality: Right;   TOTAL SHOULDER ARTHROPLASTY Left 04/25/2018   TOTAL SHOULDER ARTHROPLASTY Left 04/25/2018   Procedure: LEFT TOTAL SHOULDER REPLACEMENT;  Surgeon: Addie Cordella Hamilton, MD;  Location: [facility] OR;  Service: Orthopedics;  Laterality: Left;   TUBAL LIGATION     Patient Active Problem List   Diagnosis Date Noted   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   Weakness    Hyperlipidemia    Acute respiratory failure due to COVID-19 (HCC) 06/10/2020   Pneumonia due to COVID-19 virus 06/10/2020   Hx of multiple pulmonary nodules 06/10/2020   Acute respiratory failure with hypoxia (HCC)    Lung nodule seen on imaging study 01/08/2020   History of breast cancer 04/14/2019   Sepsis  (HCC) 04/02/2019   CAP (community acquired pneumonia) 04/02/2019   Elevated troponin 04/02/2019   Shoulder arthritis 04/25/2018   Primary osteoarthritis, 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   Chronic left shoulder pain 05/17/2017   Cervical disc disorder with radiculopathy 08/14/2016   Chronic infection of sinus 08/31/2015   Cough 08/31/2015   Allergic reaction 08/31/2015    PCP: Alda Carpen, MD  REFERRING PROVIDER: Lonni GRADE Vernetta, MD  REFERRING DIAG: [phone] (ICD-10-CM) - Status post total right knee replacement  THERAPY DIAG:  Difficulty in walking, not elsewhere classified  Muscle weakness (generalized)  Localized edema  Stiffness of right knee, not elsewhere classified  Rationale for Evaluation and Treatment: Rehabilitation  ONSET DATE: 04/22/2024  SUBJECTIVE:   SUBJECTIVE STATEMENT: Brooke Cantrell reporting she took her pain meds prior to coming to therapy today. Pt reporting 4/10  PERTINENT HISTORY: Lt breast cancer, kidney stones, HTN, Lt TSA 2019  PAIN:  Are you having pain? Yes: NPRS scale: 4/10 today, can reach 8/10 at times Pain location: Rt knee Pain description: Stiff, achy, occasional sharp Aggravating factors: Prolonged postures and too much WB Relieving factors: Pain meds, ice, change positions frequently  PRECAUTIONS: None  RED FLAGS: None   WEIGHT BEARING RESTRICTIONS: No  FALLS:  Has patient fallen in  last 6 months? No  LIVING ENVIRONMENT: Lives with: lives with their family and grandson Lives in: House/apartment Stairs: Uses the rail with steps Has following equipment at home: Single point cane and Environmental consultant - 2 wheeled  OCCUPATION: Retired  PLOF: Independent  PATIENT GOALS: Return to gardening her acre lot, weed, plant in the garden  NEXT MD VISIT: 06/04/2024 at 8:30 AM  OBJECTIVE:  Note: Objective measures were completed at Evaluation unless otherwise noted.  DIAGNOSTIC FINDINGS: Right knee arthroplasty in expected alignment. No  periprosthetic lucency or fracture. There has been patellar resurfacing. Recent postsurgical change includes air and edema in the soft tissues and joint space. Anterior skin staples in place.  PATIENT SURVEYS:  PSFS: THE PATIENT SPECIFIC FUNCTIONAL SCALE  Place score of 0-10 (0 = unable to perform activity and 10 = able to perform activity at the same level as before injury or problem)  Activity Date: 05/09/2024    Walk 2    2.  Garden 0    3.  Safely transfer to and from the ground 0    4.      Total Score 0.67      Total Score = Sum of activity scores/number of activities  Minimally Detectable Change: 3 points (for single activity); 2 points (for average score)  [name] [facility] [facility] (nd). The Patient Specific Functional Scale . Retrieved from [email or web address]   COGNITION: Overall cognitive status: Within functional limits for tasks assessed     SENSATION: WFL  EDEMA:  Noted and not objectively assessed   LOWER EXTREMITY ROM:   ROM Left/Right 05/09/2024 Rt 05/13/24  Hip flexion    Hip extension    Hip abduction    Hip adduction    Hip internal rotation    Hip external rotation    Knee flexion 145/52 Supine:  A:  P:  Sitting:  P: 70  Knee extension 0/7   Ankle dorsiflexion    Ankle plantarflexion    Ankle inversion    Ankle eversion     (Blank rows = not tested)  LOWER EXTREMITY STRENGTH:  In pounds with hand-held dynamometer Left/Right 05/09/2024   Hip flexion    Hip extension    Hip abduction    Hip adduction    Hip internal rotation    Hip external rotation    Knee flexion    Knee extension 49.7/5.9 pounds   Ankle dorsiflexion    Ankle plantarflexion    Ankle inversion    Ankle eversion     (Blank rows = not tested)  GAIT: Distance walked: 50 feet Assistive device utilized: Walker - 2 wheeled Level of assistance: Modified independence Comments: Brooke Cantrell is using her walker full-time at  this point                                                                                                                                TREATMENT DATE:  05/13/24:  TherEx Seated  SLR: 2 x 10 Seated LAQ: 2 x 10 c 2#  Heel slides with strap and ball under foot x 5 minutes SAQ: x 20 holding 5 sec TherActivites:  Nustep: Level 5 x 9 minutes, UE/LE for ROM, strengthening and endurance Sit to stand: x 2 c UE support Manual:  PROM Rt knee flexion and extension with overpressure Seated Rt knee flexion with IR and distraction Vasopneumatic:  34 deg, medium compression, x 10 minutes, LE elevated on bolster   05/09/2024 Quadriceps sets with towel roll under right heel 2 sets of 10 for 5 seconds Seated knee extension and flexion AROM 10 x 5 seconds each direction Seated passive knee flexion (left pushes right into flexion) 10 x 10 seconds  02464: Discussed the emphasis on extension and flexion active range of motion; quadriceps strengthening and edema control early in physical therapy  Vaso right knee 10 minutes medium pressure 34 degrees  PATIENT EDUCATION:  Education details: See above Person educated: Patient Education method: Explanation, Demonstration, Tactile cues, Verbal cues, and Handouts Education comprehension: verbalized understanding, returned demonstration, verbal cues required, tactile cues required, and needs further education  HOME EXERCISE PROGRAM: Access Code: [email or web address] URL: [email or web address] Date: 05/09/2024 Prepared by: Lamar Ivory  Exercises - Seated Knee Flexion Extension AAROM with Overpressure  - 10 x daily - 7 x weekly - 1 sets - 10 reps - 3 seconds hold - Seated Knee Flexion AAROM  - 10 x daily - 7 x weekly - 1 sets - 10 reps - 10 seconds hold - Supine Quadricep Sets  - 10 x daily - 7 x weekly - 10 sets - 10 reps - 5 second hold  ASSESSMENT:  CLINICAL IMPRESSION: Pt arriving today reporting compliance in her HEP. Pt with sitting  passive Rt knee flexion to 70 degrees and Rt knee extension to -8 degrees. Pt edu in ice with elevation above the pt's heart to help with edema. Recommending continued skilled PT.   OBJECTIVE IMPAIRMENTS: Abnormal gait, decreased activity tolerance, decreased endurance, decreased knowledge of condition, difficulty walking, decreased ROM, decreased strength, increased edema, and pain.   ACTIVITY LIMITATIONS: bending, sitting, standing, squatting, sleeping, stairs, transfers, bed mobility, dressing, and locomotion level  PARTICIPATION LIMITATIONS: community activity and yard work  PERSONAL FACTORS: Lt breast cancer, kidney stones, HTN, Lt TSA 2019 are also affecting patient's functional outcome.   REHAB POTENTIAL: Good  CLINICAL DECISION MAKING: Stable/uncomplicated  EVALUATION COMPLEXITY: Low   GOALS: Goals reviewed with patient? Yes  SHORT TERM GOALS: Target date: 06/20/2024 Brooke Cantrell will be independent with her day 1 home exercise program Baseline: Started 05/09/2024 Goal status: ongoing 05/13/24  2.  Improve right knee active range of motion to 3 - 0 -95 degrees Baseline: 7 - 0 - 52 degrees Goal status: ongoing 05/13/24  3.  Improve right quadriceps strength to at least 30 pounds Baseline: 5.9 pounds Goal status: INITIAL   LONG TERM GOALS: Target date: 08/01/2024  Improve patient specific functional score to at least 5 Baseline: 0.67 Goal status: INITIAL  2.  Brooke Cantrell will report right knee pain consistently 3/10 or better on the numeric pain rating scale Baseline: Can be as high as 8/10 Goal status: INITIAL  3.  Improve right knee AROM to 2 - 0 -110 degrees Baseline: 7 - 0 - 52 degrees Goal status: INITIAL  4.  Improve bilateral quadriceps strength to 60 pounds or better Baseline: See objective Goal status: INITIAL  5.  Brooke Cantrell will be comfortable without the use of an assistive  device within the house and with no more than a single-point cane outside the house at transfer into  independent rehabilitation Baseline: 2 wheeled walker full-time Goal status: INITIAL  6.  We will be independent with a long-term home maintenance exercise program at discharge Baseline: Started 05/09/2024 Goal status: INITIAL   PLAN:  PT FREQUENCY: 2-3 times a week  PT DURATION: 12 weeks  PLANNED INTERVENTIONS: 97750- Physical Performance Testing, 97110-Therapeutic exercises, 97530- Therapeutic activity, 97112- Neuromuscular re-education, 97535- Self Care, 02859- Manual therapy, [phone]- Gait training, [phone]- Electrical stimulation (unattended), 97016- Vasopneumatic device, Patient/Family education, Balance training, Stair training, Joint mobilization, and Cryotherapy  PLAN FOR NEXT SESSION: Rt knee ROM, quad strengthening, gait training   Delon JONELLE Lunger, PT, MPT 05/13/2024, 5:07 PM

## 2024-05-14 ENCOUNTER — Encounter: Admitting: Orthopaedic Surgery

## 2024-05-14 ENCOUNTER — Other Ambulatory Visit: Payer: Self-pay | Admitting: Orthopaedic Surgery

## 2024-05-16 ENCOUNTER — Encounter: Payer: Self-pay | Admitting: Physical Therapy

## 2024-05-16 ENCOUNTER — Ambulatory Visit (INDEPENDENT_AMBULATORY_CARE_PROVIDER_SITE_OTHER): Admitting: Physical Therapy

## 2024-05-16 DIAGNOSIS — R600 Localized edema: Secondary | ICD-10-CM

## 2024-05-16 DIAGNOSIS — M6281 Muscle weakness (generalized): Secondary | ICD-10-CM

## 2024-05-16 DIAGNOSIS — R262 Difficulty in walking, not elsewhere classified: Secondary | ICD-10-CM

## 2024-05-16 DIAGNOSIS — M25661 Stiffness of right knee, not elsewhere classified: Secondary | ICD-10-CM | POA: Diagnosis not present

## 2024-05-16 DIAGNOSIS — M25561 Pain in right knee: Secondary | ICD-10-CM

## 2024-05-16 NOTE — Therapy (Signed)
 OUTPATIENT PHYSICAL THERAPY TREATMENT   Patient Name: Brooke Cantrell MRN: [id number] DOB:1945-12-25, 78 y.o., female Today's Date: 05/16/2024  END OF SESSION:  PT End of Session - 05/16/24 1104     Visit Number 3    Number of Visits 24    Date for PT Re-Evaluation 08/01/24    Authorization Type MEDICARE    Progress Note Due on Visit 10    PT Start Time 1051    PT Stop Time 1143    PT Time Calculation (min) 52 min    Activity Tolerance Patient tolerated treatment well;No increased pain;Patient limited by pain    Behavior During Therapy [facility] for tasks assessed/performed            Past Medical History:  Diagnosis Date   Arthritis    Breast cancer (HCC)    Left breast   Depression    mild, no meds   History of kidney stones    Hypertension    Past Surgical History:  Procedure Laterality Date   breast cancer surgery     BREAST ENHANCEMENT SURGERY     BREAST LUMPECTOMY Left    CATARACT EXTRACTION, BILATERAL     ECTOPIC PREGNANCY SURGERY     EYE SURGERY     INNER EAR SURGERY     TONSILLECTOMY     TOTAL KNEE ARTHROPLASTY Right 04/22/2024   Procedure: ARTHROPLASTY, KNEE, TOTAL;  Surgeon: Vernetta Lonni GRADE, MD;  Location: [facility] OR;  Service: Orthopedics;  Laterality: Right;   TOTAL SHOULDER ARTHROPLASTY Left 04/25/2018   TOTAL SHOULDER ARTHROPLASTY Left 04/25/2018   Procedure: LEFT TOTAL SHOULDER REPLACEMENT;  Surgeon: Addie Cordella Hamilton, MD;  Location: [facility] OR;  Service: Orthopedics;  Laterality: Left;   TUBAL LIGATION     Patient Active Problem List   Diagnosis Date Noted   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   Weakness    Hyperlipidemia    Acute respiratory failure due to COVID-19 (HCC) 06/10/2020   Pneumonia due to COVID-19 virus 06/10/2020   Hx of multiple pulmonary nodules 06/10/2020   Acute respiratory failure with hypoxia (HCC)    Lung nodule seen on imaging study 01/08/2020   History of breast cancer 04/14/2019   Sepsis (HCC) 04/02/2019    CAP (community acquired pneumonia) 04/02/2019   Elevated troponin 04/02/2019   Shoulder arthritis 04/25/2018   Primary osteoarthritis, 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   Chronic left shoulder pain 05/17/2017   Cervical disc disorder with radiculopathy 08/14/2016   Chronic infection of sinus 08/31/2015   Cough 08/31/2015   Allergic reaction 08/31/2015    PCP: Alda Carpen, MD  REFERRING PROVIDER: Lonni GRADE Vernetta, MD  REFERRING DIAG: [phone] (ICD-10-CM) - Status post total right knee replacement  THERAPY DIAG:  Difficulty in walking, not elsewhere classified  Muscle weakness (generalized)  Localized edema  Stiffness of right knee, not elsewhere classified  Acute pain of right knee  Rationale for Evaluation and Treatment: Rehabilitation  ONSET DATE: 04/22/2024  SUBJECTIVE:   SUBJECTIVE STATEMENT: Was having a lot of pain after last session, has improved now.   PERTINENT HISTORY: Lt breast cancer, kidney stones, HTN, Lt TSA 2019  PAIN:  Are you having pain? Yes: NPRS scale: 2-3/10 today, can reach 8/10 at times Pain location: Rt knee Pain description: Stiff, achy, occasional sharp Aggravating factors: Prolonged postures and too much WB Relieving factors: Pain meds, ice, change positions frequently  PRECAUTIONS: None  RED FLAGS: None   WEIGHT BEARING RESTRICTIONS: No  FALLS:  Has patient  fallen in last 6 months? No  LIVING ENVIRONMENT: Lives with: lives with their family and grandson Lives in: House/apartment Stairs: Uses the rail with steps Has following equipment at home: Single point cane and Environmental consultant - 2 wheeled  OCCUPATION: Retired  PLOF: Independent  PATIENT GOALS: Return to gardening her acre lot, weed, plant in the garden  NEXT MD VISIT: 06/04/2024 at 8:30 AM  OBJECTIVE:  Note: Objective measures were completed at Evaluation unless otherwise noted.  DIAGNOSTIC FINDINGS: Right knee arthroplasty in expected alignment. No  periprosthetic lucency or fracture. There has been patellar resurfacing. Recent postsurgical change includes air and edema in the soft tissues and joint space. Anterior skin staples in place.  PATIENT SURVEYS:  PSFS: THE PATIENT SPECIFIC FUNCTIONAL SCALE  Place score of 0-10 (0 = unable to perform activity and 10 = able to perform activity at the same level as before injury or problem)  Activity Date: 05/09/2024    Walk 2    2.  Garden 0    3.  Safely transfer to and from the ground 0    4.      Total Score 0.67      Total Score = Sum of activity scores/number of activities  Minimally Detectable Change: 3 points (for single activity); 2 points (for average score)  [name] (nd). The Patient Specific Functional Scale . Retrieved from [email or web address]   COGNITION: Overall cognitive status: Within functional limits for tasks assessed     SENSATION: WFL  EDEMA:  Noted and not objectively assessed   LOWER EXTREMITY ROM:   ROM Left/Right 05/09/2024 Rt 05/13/24  Hip flexion    Hip extension    Hip abduction    Hip adduction    Hip internal rotation    Hip external rotation    Knee flexion 145/52 Supine:  A:  P:  Sitting:  P: 70  Knee extension 0/7   Ankle dorsiflexion    Ankle plantarflexion    Ankle inversion    Ankle eversion     (Blank rows = not tested)  LOWER EXTREMITY STRENGTH:  In pounds with hand-held dynamometer Left/Right 05/09/2024   Hip flexion    Hip extension    Hip abduction    Hip adduction    Hip internal rotation    Hip external rotation    Knee flexion    Knee extension 49.7/5.9 pounds   Ankle dorsiflexion    Ankle plantarflexion    Ankle inversion    Ankle eversion     (Blank rows = not tested)  GAIT: Distance walked: 50 feet Assistive device utilized: Walker - 2 wheeled Level of assistance: Modified independence Comments: Legend is using her walker full-time at  this point                                                                                                                                TREATMENT DATE:  05/16/24 TherAct  NuStep L3 x 8 min; UE/LEs working on ROM and strength Seated tailgate flexion x 2 min Reciprocal LAQ 2x10 with 5 sec holds at end ranges; 3# on Rt Sit to/from stand x 10 reps; without UE support from elevated surface Quad set in heel prop for muscle activation and ROM 2x10; 5 sec hold  Manual PROM Rt knee flexion and extension with overpressure Seated Rt knee flexion with IR and distraction  Modalities Vaso x 10 min; mod pressure to Rt knee; 34 deg  05/13/24:  TherEx Seated SLR: 2 x 10 Seated LAQ: 2 x 10 c 2#  Heel slides with strap and ball under foot x 5 minutes SAQ: x 20 holding 5 sec TherActivites:  Nustep: Level 5 x 9 minutes, UE/LE for ROM, strengthening and endurance Sit to stand: x 2 c UE support Manual:  PROM Rt knee flexion and extension with overpressure Seated Rt knee flexion with IR and distraction Vasopneumatic:  34 deg, medium compression, x 10 minutes, LE elevated on bolster   05/09/2024 Quadriceps sets with towel roll under right heel 2 sets of 10 for 5 seconds Seated knee extension and flexion AROM 10 x 5 seconds each direction Seated passive knee flexion (left pushes right into flexion) 10 x 10 seconds  02464: Discussed the emphasis on extension and flexion active range of motion; quadriceps strengthening and edema control early in physical therapy  Vaso right knee 10 minutes medium pressure 34 degrees  PATIENT EDUCATION:  Education details: See above Person educated: Patient Education method: Explanation, Demonstration, Tactile cues, Verbal cues, and Handouts Education comprehension: verbalized understanding, returned demonstration, verbal cues required, tactile cues required, and needs further education  HOME EXERCISE PROGRAM: Access Code: [email or web address] URL:  [email or web address] Date: 05/09/2024 Prepared by: Lamar Ivory  Exercises - Seated Knee Flexion Extension AAROM with Overpressure  - 10 x daily - 7 x weekly - 1 sets - 10 reps - 3 seconds hold - Seated Knee Flexion AAROM  - 10 x daily - 7 x weekly - 1 sets - 10 reps - 10 seconds hold - Supine Quadricep Sets  - 10 x daily - 7 x weekly - 10 sets - 10 reps - 5 second hold  ASSESSMENT:  CLINICAL IMPRESSION: Pt tolerated session well today continuing to work on maximizing ROM and strengthening.  Will continue to benefit from PT to maximize function.  OBJECTIVE IMPAIRMENTS: Abnormal gait, decreased activity tolerance, decreased endurance, decreased knowledge of condition, difficulty walking, decreased ROM, decreased strength, increased edema, and pain.   ACTIVITY LIMITATIONS: bending, sitting, standing, squatting, sleeping, stairs, transfers, bed mobility, dressing, and locomotion level  PARTICIPATION LIMITATIONS: community activity and yard work  PERSONAL FACTORS: Lt breast cancer, kidney stones, HTN, Lt TSA 2019 are also affecting patient's functional outcome.   REHAB POTENTIAL: Good  CLINICAL DECISION MAKING: Stable/uncomplicated  EVALUATION COMPLEXITY: Low   GOALS: Goals reviewed with patient? Yes  SHORT TERM GOALS: Target date: 06/20/2024 Mikaylah will be independent with her day 1 home exercise program Baseline: Started 05/09/2024 Goal status: ongoing 05/13/24  2.  Improve right knee active range of motion to 3 - 0 -95 degrees Baseline: 7 - 0 - 52 degrees Goal status: ongoing 05/13/24  3.  Improve right quadriceps strength to at least 30 pounds Baseline: 5.9 pounds Goal status: INITIAL   LONG TERM GOALS: Target date: 08/01/2024  Improve patient specific functional score to at least 5 Baseline: 0.67 Goal status: INITIAL  2.  Yassmin will report right knee pain consistently 3/10 or  better on the numeric pain rating scale Baseline: Can be as high as 8/10 Goal  status: INITIAL  3.  Improve right knee AROM to 2 - 0 -110 degrees Baseline: 7 - 0 - 52 degrees Goal status: INITIAL  4.  Improve bilateral quadriceps strength to 60 pounds or better Baseline: See objective Goal status: INITIAL  5.  Hendrix will be comfortable without the use of an assistive device within the house and with no more than a single-point cane outside the house at transfer into independent rehabilitation Baseline: 2 wheeled walker full-time Goal status: INITIAL  6.  We will be independent with a long-term home maintenance exercise program at discharge Baseline: Started 05/09/2024 Goal status: INITIAL   PLAN:  PT FREQUENCY: 2-3 times a week  PT DURATION: 12 weeks  PLANNED INTERVENTIONS: 97750- Physical Performance Testing, 97110-Therapeutic exercises, 97530- Therapeutic activity, 97112- Neuromuscular re-education, 97535- Self Care, 02859- Manual therapy, [phone]- Gait training, [phone]- Electrical stimulation (unattended), 97016- Vasopneumatic device, Patient/Family education, Balance training, Stair training, Joint mobilization, and Cryotherapy  PLAN FOR NEXT SESSION: measure ROM,  Rt knee ROM, quad strengthening, gait training with cane   Corean JULIANNA Ku, PT, DPT 05/16/24 11:47 AM

## 2024-05-20 ENCOUNTER — Ambulatory Visit (INDEPENDENT_AMBULATORY_CARE_PROVIDER_SITE_OTHER): Admitting: Physical Therapy

## 2024-05-20 ENCOUNTER — Encounter: Payer: Self-pay | Admitting: Physical Therapy

## 2024-05-20 DIAGNOSIS — M25561 Pain in right knee: Secondary | ICD-10-CM

## 2024-05-20 DIAGNOSIS — R600 Localized edema: Secondary | ICD-10-CM

## 2024-05-20 DIAGNOSIS — R262 Difficulty in walking, not elsewhere classified: Secondary | ICD-10-CM | POA: Diagnosis not present

## 2024-05-20 DIAGNOSIS — M6281 Muscle weakness (generalized): Secondary | ICD-10-CM | POA: Diagnosis not present

## 2024-05-20 DIAGNOSIS — M25661 Stiffness of right knee, not elsewhere classified: Secondary | ICD-10-CM | POA: Diagnosis not present

## 2024-05-20 NOTE — Therapy (Signed)
 OUTPATIENT PHYSICAL THERAPY TREATMENT   Patient Name: TELISA OHLSEN MRN: [id number] DOB:[birth year], 78 y.o., female Today's Date: 05/20/2024  END OF SESSION:  PT End of Session - 05/20/24 1516     Visit Number 4    Number of Visits 24    Date for PT Re-Evaluation 08/01/24    Authorization Type MEDICARE    Progress Note Due on Visit 10    PT Start Time 1516    PT Stop Time 1600    PT Time Calculation (min) 44 min    Activity Tolerance Patient tolerated treatment well;No increased pain;Patient limited by pain    Behavior During Therapy [facility] for tasks assessed/performed             Past Medical History:  Diagnosis Date   Arthritis    Breast cancer (HCC)    Left breast   Depression    mild, no meds   History of kidney stones    Hypertension    Past Surgical History:  Procedure Laterality Date   breast cancer surgery     BREAST ENHANCEMENT SURGERY     BREAST LUMPECTOMY Left    CATARACT EXTRACTION, BILATERAL     ECTOPIC PREGNANCY SURGERY     EYE SURGERY     INNER EAR SURGERY     TONSILLECTOMY     TOTAL KNEE ARTHROPLASTY Right 04/22/2024   Procedure: ARTHROPLASTY, KNEE, TOTAL;  Surgeon: Vernetta Lonni GRADE, MD;  Location: [facility] OR;  Service: Orthopedics;  Laterality: Right;   TOTAL SHOULDER ARTHROPLASTY Left 04/25/2018   TOTAL SHOULDER ARTHROPLASTY Left 04/25/2018   Procedure: LEFT TOTAL SHOULDER REPLACEMENT;  Surgeon: Addie Cordella Hamilton, MD;  Location: [facility] OR;  Service: Orthopedics;  Laterality: Left;   TUBAL LIGATION     Patient Active Problem List   Diagnosis Date Noted   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   Weakness    Hyperlipidemia    Acute respiratory failure due to COVID-19 (HCC) 06/10/2020   Pneumonia due to COVID-19 virus 06/10/2020   Hx of multiple pulmonary nodules 06/10/2020   Acute respiratory failure with hypoxia (HCC)    Lung nodule seen on imaging study 01/08/2020   History of breast cancer 04/14/2019   Sepsis (HCC)  04/02/2019   CAP (community acquired pneumonia) 04/02/2019   Elevated troponin 04/02/2019   Shoulder arthritis 04/25/2018   Primary osteoarthritis, 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   Chronic left shoulder pain 05/17/2017   Cervical disc disorder with radiculopathy 08/14/2016   Chronic infection of sinus 08/31/2015   Cough 08/31/2015   Allergic reaction 08/31/2015    PCP: Alda Carpen, MD  REFERRING PROVIDER: Lonni GRADE Vernetta, MD  REFERRING DIAG: [phone] (ICD-10-CM) - Status post total right knee replacement  THERAPY DIAG:  Difficulty in walking, not elsewhere classified  Muscle weakness (generalized)  Localized edema  Stiffness of right knee, not elsewhere classified  Acute pain of right knee  Rationale for Evaluation and Treatment: Rehabilitation  ONSET DATE: 04/22/2024  SUBJECTIVE:   SUBJECTIVE STATEMENT: The exercises are going good.  She has been sleeping good until last night.  She had a quad spasm. She is sleeping in recliner.   PERTINENT HISTORY: Lt breast cancer, kidney stones, HTN, Lt TSA 2019  PAIN:  Are you having pain? Yes: NPRS scale: in last week  at rest 0-3/10,  standing / walking (hurts first step or 2)  2-5/10,  exercising / bending up to 8/10 Pain location: Rt knee Pain description: Stiff, achy, occasional sharp Aggravating factors:  Prolonged postures and too much WB Relieving factors: Pain meds, ice, change positions frequently  PRECAUTIONS: None  RED FLAGS: None   WEIGHT BEARING RESTRICTIONS: No  FALLS:  Has patient fallen in last 6 months? No  LIVING ENVIRONMENT: Lives with: lives with their family and grandson Lives in: House/apartment Stairs: Uses the rail with steps Has following equipment at home: Single point cane and Environmental consultant - 2 wheeled  OCCUPATION: Retired  PLOF: Independent  PATIENT GOALS: Return to gardening her acre lot, weed, plant in the garden  NEXT MD VISIT: 06/04/2024 at 8:30 AM  OBJECTIVE:  Note: Objective  measures were completed at Evaluation unless otherwise noted.  DIAGNOSTIC FINDINGS: Right knee arthroplasty in expected alignment. No periprosthetic lucency or fracture. There has been patellar resurfacing. Recent postsurgical change includes air and edema in the soft tissues and joint space. Anterior skin staples in place.  PATIENT SURVEYS:  PSFS: THE PATIENT SPECIFIC FUNCTIONAL SCALE  Place score of 0-10 (0 = unable to perform activity and 10 = able to perform activity at the same level as before injury or problem)  Activity Date: 05/09/2024    Walk 2    2.  Garden 0    3.  Safely transfer to and from the ground 0    4.      Total Score 0.67      Total Score = Sum of activity scores/number of activities  Minimally Detectable Change: 3 points (for single activity); 2 points (for average score)  [name] [facility] [facility] (nd). The Patient Specific Functional Scale . Retrieved from [email or web address]   COGNITION: Overall cognitive status: Within functional limits for tasks assessed     SENSATION: WFL  EDEMA:  Noted and not objectively assessed  LOWER EXTREMITY ROM:   ROM Left/Right 05/09/2024 Rt 05/13/24 Right 05/20/24  Hip flexion     Hip extension     Hip abduction     Hip adduction     Hip internal rotation     Hip external rotation     Knee flexion 145/52 Supine:  A:  P:  Sitting:  P: 70 Supine AA: 76*  Knee extension 0/7    Ankle dorsiflexion     Ankle plantarflexion     Ankle inversion     Ankle eversion      (Blank rows = not tested)  LOWER EXTREMITY STRENGTH:  In pounds with hand-held dynamometer Left/Right 05/09/2024   Hip flexion    Hip extension    Hip abduction    Hip adduction    Hip internal rotation    Hip external rotation    Knee flexion    Knee extension 49.7/5.9 pounds   Ankle dorsiflexion    Ankle plantarflexion    Ankle inversion    Ankle eversion     (Blank rows = not  tested)  GAIT: Distance walked: 50 feet Assistive device utilized: Walker - 2 wheeled Level of assistance: Modified independence Comments: Catalea is using her walker full-time at this point  TREATMENT DATE:  05/20/2024 Therapeutic Exercise: Sci Fit bike using BLEs & BUEs seat 9 level 1 partial revolutions for flexion stretch for 6 minutes, PT manual assisted full revolutions for 3 reps, then full revolutions forward for 2 mins.   Gastroc stretch on incline board 30 sec hold 3 reps PT recommended exercising 1-2 exercises for ~5 minutes every hour that she is awake to limit muscle soreness and stiffness.  Pt verbalized understanding.  Seated RLE heel slide with foot on washcloth (reduce resistance) ext / quad set 5 sec and flexion with end range assist LLE 5 sec 10 reps Supine RLE heel slide with strap & ball (reduce resistance) ext / quad set 5 sec and flexion with end range assist LLE 5 sec 10 reps  Therapeutic Activities: Weight shift onto RLE ~3 sec 5 reps upon arising.  Pt had less initial sharp pains causing antalgic pattern.  Leg press BLEs 75# 10 reps 3 sets working on sit to/from stand function. PT instructed with demo & verbal cues on using 24 bar stool for standing ADLs including sit to/from standing.  Pt verbalized and return demo understanding.   Manual: PROM Rt knee flexion and extension with overpressure Seated Rt knee flexion with IR and distraction   TREATMENT DATE:  05/16/24 TherAct NuStep L3 x 8 min; UE/LEs working on ROM and strength Seated tailgate flexion x 2 min Reciprocal LAQ 2x10 with 5 sec holds at end ranges; 3# on Rt Sit to/from stand x 10 reps; without UE support from elevated surface Quad set in heel prop for muscle activation and ROM 2x10; 5 sec hold  Manual PROM Rt knee flexion and extension with overpressure Seated Rt knee  flexion with IR and distraction  Modalities Vaso x 10 min; mod pressure to Rt knee; 34 deg  05/13/24:  TherEx Seated SLR: 2 x 10 Seated LAQ: 2 x 10 c 2#  Heel slides with strap and ball under foot x 5 minutes SAQ: x 20 holding 5 sec TherActivites:  Nustep: Level 5 x 9 minutes, UE/LE for ROM, strengthening and endurance Sit to stand: x 2 c UE support Manual:  PROM Rt knee flexion and extension with overpressure Seated Rt knee flexion with IR and distraction Vasopneumatic:  34 deg, medium compression, x 10 minutes, LE elevated on bolster   PATIENT EDUCATION:  Education details: See above Person educated: Patient Education method: Explanation, Demonstration, Tactile cues, Verbal cues, and Handouts Education comprehension: verbalized understanding, returned demonstration, verbal cues required, tactile cues required, and needs further education  HOME EXERCISE PROGRAM: Access Code: [email or web address] URL: [email or web address] Date: 05/09/2024 Prepared by: Lamar Ivory  Exercises - Seated Knee Flexion Extension AAROM with Overpressure  - 10 x daily - 7 x weekly - 1 sets - 10 reps - 3 seconds hold - Seated Knee Flexion AAROM  - 10 x daily - 7 x weekly - 1 sets - 10 reps - 10 seconds hold - Supine Quadricep Sets  - 10 x daily - 7 x weekly - 10 sets - 10 reps - 5 second hold  ASSESSMENT:  CLINICAL IMPRESSION: Patient was very anxious with movement initially due to pain but improved with progressive PT activities to gradually improve flexibility.  Patient was able to progress SciFit bike to full revolutions.  Patient tolerated introduction of leg press machine to start working on functional strength.  Will continue to benefit from PT to maximize function.  OBJECTIVE IMPAIRMENTS: Abnormal gait, decreased activity tolerance, decreased endurance, decreased knowledge of condition, difficulty  walking, decreased ROM, decreased strength, increased edema, and pain.   ACTIVITY  LIMITATIONS: bending, sitting, standing, squatting, sleeping, stairs, transfers, bed mobility, dressing, and locomotion level  PARTICIPATION LIMITATIONS: community activity and yard work  PERSONAL FACTORS: Lt breast cancer, kidney stones, HTN, Lt TSA 2019 are also affecting patient's functional outcome.   REHAB POTENTIAL: Good  CLINICAL DECISION MAKING: Stable/uncomplicated  EVALUATION COMPLEXITY: Low   GOALS: Goals reviewed with patient? Yes  SHORT TERM GOALS: Target date: 06/20/2024 Zoraya will be independent with her day 1 home exercise program Baseline: Started 05/09/2024 Goal status:   Ongoing   05/20/2024  2.  Improve right knee active range of motion to 3 - 0 -95 degrees Baseline: 7 - 0 - 52 degrees Goal status: Ongoing   05/20/2024  3.  Improve right quadriceps strength to at least 30 pounds Baseline: 5.9 pounds Goal status: Ongoing   05/20/2024   LONG TERM GOALS: Target date: 08/01/2024  Improve patient specific functional score to at least 5 Baseline: 0.67 Goal status: Ongoing   05/20/2024  2.  Kayler will report right knee pain consistently 3/10 or better on the numeric pain rating scale Baseline: Can be as high as 8/10 Goal status: Ongoing   05/20/2024  3.  Improve right knee AROM to 2 - 0 -110 degrees Baseline: 7 - 0 - 52 degrees Goal status: Ongoing   05/20/2024  4.  Improve bilateral quadriceps strength to 60 pounds or better Baseline: See objective Goal status: Ongoing   05/20/2024  5.  Kaitlynn will be comfortable without the use of an assistive device within the house and with no more than a single-point cane outside the house at transfer into independent rehabilitation Baseline: 2 wheeled walker full-time Goal status: Ongoing   05/20/2024  6.  We will be independent with a long-term home maintenance exercise program at discharge Baseline: Started 05/09/2024 Goal status: Ongoing   05/20/2024   PLAN:  PT FREQUENCY: 2-3 times a week  PT DURATION: 12 weeks  PLANNED  INTERVENTIONS: 97750- Physical Performance Testing, 97110-Therapeutic exercises, 97530- Therapeutic activity, 97112- Neuromuscular re-education, 97535- Self Care, 02859- Manual therapy, [phone]- Gait training, [phone]- Electrical stimulation (unattended), 97016- Vasopneumatic device, Patient/Family education, Balance training, Stair training, Joint mobilization, and Cryotherapy  PLAN FOR NEXT SESSION:   measure ROM, therapeutic exercise and activities to progress range and functional strength, manual therapy, gait training with cane    Grayce Spatz, PT, DPT 05/20/2024, 9:34 PM

## 2024-05-21 ENCOUNTER — Ambulatory Visit: Admitting: Physical Therapy

## 2024-05-21 ENCOUNTER — Encounter: Payer: Self-pay | Admitting: Physical Therapy

## 2024-05-21 DIAGNOSIS — M25661 Stiffness of right knee, not elsewhere classified: Secondary | ICD-10-CM | POA: Diagnosis not present

## 2024-05-21 DIAGNOSIS — M6281 Muscle weakness (generalized): Secondary | ICD-10-CM

## 2024-05-21 DIAGNOSIS — R262 Difficulty in walking, not elsewhere classified: Secondary | ICD-10-CM | POA: Diagnosis not present

## 2024-05-21 DIAGNOSIS — R600 Localized edema: Secondary | ICD-10-CM

## 2024-05-21 DIAGNOSIS — M25561 Pain in right knee: Secondary | ICD-10-CM

## 2024-05-21 NOTE — Therapy (Signed)
 OUTPATIENT PHYSICAL THERAPY TREATMENT   Patient Name: Brooke Cantrell MRN: [id number] DOB:September 15, 1945, 78 y.o., female Today's Date: 05/21/2024  END OF SESSION:  PT End of Session - 05/21/24 0801     Visit Number 5    Number of Visits 24    Date for PT Re-Evaluation 08/01/24    Authorization Type MEDICARE    Progress Note Due on Visit 10    PT Start Time 0800    PT Stop Time 0859    PT Time Calculation (min) 59 min    Activity Tolerance Patient tolerated treatment well;No increased pain;Patient limited by pain    Behavior During Therapy [facility] for tasks assessed/performed              Past Medical History:  Diagnosis Date   Arthritis    Breast cancer (HCC)    Left breast   Depression    mild, no meds   History of kidney stones    Hypertension    Past Surgical History:  Procedure Laterality Date   breast cancer surgery     BREAST ENHANCEMENT SURGERY     BREAST LUMPECTOMY Left    CATARACT EXTRACTION, BILATERAL     ECTOPIC PREGNANCY SURGERY     EYE SURGERY     INNER EAR SURGERY     TONSILLECTOMY     TOTAL KNEE ARTHROPLASTY Right 04/22/2024   Procedure: ARTHROPLASTY, KNEE, TOTAL;  Surgeon: Vernetta Lonni GRADE, MD;  Location: [facility] OR;  Service: Orthopedics;  Laterality: Right;   TOTAL SHOULDER ARTHROPLASTY Left 04/25/2018   TOTAL SHOULDER ARTHROPLASTY Left 04/25/2018   Procedure: LEFT TOTAL SHOULDER REPLACEMENT;  Surgeon: Addie Cordella Hamilton, MD;  Location: [facility] OR;  Service: Orthopedics;  Laterality: Left;   TUBAL LIGATION     Patient Active Problem List   Diagnosis Date Noted   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   Weakness    Hyperlipidemia    Acute respiratory failure due to COVID-19 (HCC) 06/10/2020   Pneumonia due to COVID-19 virus 06/10/2020   Hx of multiple pulmonary nodules 06/10/2020   Acute respiratory failure with hypoxia (HCC)    Lung nodule seen on imaging study 01/08/2020   History of breast cancer 04/14/2019   Sepsis (HCC)  04/02/2019   CAP (community acquired pneumonia) 04/02/2019   Elevated troponin 04/02/2019   Shoulder arthritis 04/25/2018   Primary osteoarthritis, 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   Chronic left shoulder pain 05/17/2017   Cervical disc disorder with radiculopathy 08/14/2016   Chronic infection of sinus 08/31/2015   Cough 08/31/2015   Allergic reaction 08/31/2015    PCP: Alda Carpen, MD  REFERRING PROVIDER: Lonni GRADE Vernetta, MD  REFERRING DIAG: [phone] (ICD-10-CM) - Status post total right knee replacement  THERAPY DIAG:  Difficulty in walking, not elsewhere classified  Muscle weakness (generalized)  Localized edema  Stiffness of right knee, not elsewhere classified  Acute pain of right knee  Rationale for Evaluation and Treatment: Rehabilitation  ONSET DATE: 04/22/2024  SUBJECTIVE:   SUBJECTIVE STATEMENT: She slept better last night and was not as sore from PT session.  She is a little nauseated this morning (PT cued on taking meds without food on stomach & pt verbalized understanding.)  PERTINENT HISTORY: Lt breast cancer, kidney stones, HTN, Lt TSA 2019  PAIN:  Are you having pain? Yes: NPRS scale:  in last week  at rest 0-3/10,  standing / walking (hurts first step or 2)  2-5/10,  exercising / bending up to 8/10 Pain location:  Rt knee Pain description: Stiff, achy, occasional sharp Aggravating factors: Prolonged postures and too much WB Relieving factors: Pain meds, ice, change positions frequently  PRECAUTIONS: None  RED FLAGS: None   WEIGHT BEARING RESTRICTIONS: No  FALLS:  Has patient fallen in last 6 months? No  LIVING ENVIRONMENT: Lives with: lives with their family and grandson Lives in: House/apartment Stairs: Uses the rail with steps Has following equipment at home: Single point cane and Environmental consultant - 2 wheeled  OCCUPATION: Retired  PLOF: Independent  PATIENT GOALS: Return to gardening her acre lot, weed, plant in the garden  NEXT MD  VISIT: 06/04/2024 at 8:30 AM  OBJECTIVE:  Note: Objective measures were completed at Evaluation unless otherwise noted.  DIAGNOSTIC FINDINGS: Right knee arthroplasty in expected alignment. No periprosthetic lucency or fracture. There has been patellar resurfacing. Recent postsurgical change includes air and edema in the soft tissues and joint space. Anterior skin staples in place.  PATIENT SURVEYS:  PSFS: THE PATIENT SPECIFIC FUNCTIONAL SCALE  Place score of 0-10 (0 = unable to perform activity and 10 = able to perform activity at the same level as before injury or problem)  Activity Date:  05/09/2024    Walk 2    2.  Garden 0    3.  Safely transfer to and from the ground 0    4.      Total Score 0.67      Total Score = Sum of activity scores/number of activities  Minimally Detectable Change: 3 points (for single activity); 2 points (for average score)  [name] (nd). The Patient Specific Functional Scale . Retrieved from [email or web address]   COGNITION: Overall cognitive status: Within functional limits for tasks assessed     SENSATION: WFL  EDEMA:  Noted and not objectively assessed  LOWER EXTREMITY ROM:   ROM Left/Right 05/09/2024 Rt 05/13/24 Right 05/20/24 Right 05/21/24  Hip flexion      Hip extension      Hip abduction      Hip adduction      Hip internal rotation      Hip external rotation      Knee flexion 145/52 Supine:  A:  P:  Sitting:  P: 70 Supine AA: 76* Seated P: 78*  Knee extension 0/7     Ankle dorsiflexion      Ankle plantarflexion      Ankle inversion      Ankle eversion       (Blank rows = not tested)  LOWER EXTREMITY STRENGTH:  In pounds with hand-held dynamometer Left/Right 05/09/2024   Hip flexion    Hip extension    Hip abduction    Hip adduction    Hip internal rotation    Hip external rotation    Knee flexion    Knee extension 49.7/5.9 pounds   Ankle  dorsiflexion    Ankle plantarflexion    Ankle inversion    Ankle eversion     (Blank rows = not tested)  GAIT: Distance walked: 50 feet Assistive device utilized: Environmental consultant - 2 wheeled Level of assistance: Modified independence Comments: Reita is using her walker full-time at this point   TREATMENT DATE:  05/21/2024 Therapeutic Exercise: Sci Fit bike using BLEs & BUEs seat 9 level 1 partial revolutions for flexion stretch for 4 minutes, PT manual assisted full revolutions for 3 reps, then full revolutions forward for 4 mins.  Attempted no UE support near end but too painful for quad.  Gastroc stretch on  incline board 30 sec hold 3 reps and BLE heel raises bw stretches 10 reps 2 sets Seated RLE LAQ & active knee flexion with LLE opposing motion for 15 reps.   Therapeutic Activities: Weight shift onto RLE ~3 sec 5 reps upon arising.  Pt had less initial sharp pains causing antalgic pattern.  Leg press BLEs 75# 10 reps 2 sets & RLE only 37# 10 reps 1 set working on sit to/from stand function. PT reviewed use of 24 bar stool for standing ADLs.  Self-Care: PT recommended elevation with LE higher than heart for >15 min >/= 2x/day.  Ankle exercises 1 min 3-5 reps while elevated. Pt verbalized understanding.   Gait Training: PT cued pt on cane use.  Pt amb 75' and around clinic with cane with light HHA with cues on step thru pattern and sequencing.   Manual: PROM Rt knee flexion and extension with overpressure and contract - relax technique Seated Rt knee flexion with IR and distraction  Vaso Rt knee with elevation towel roll ext stretch 10 min medium compression 34*                                                                                                                               TREATMENT DATE:  05/20/2024 Therapeutic Exercise: Sci Fit bike using BLEs & BUEs seat 9 level 1 partial revolutions for flexion stretch for 6 minutes, PT manual assisted full revolutions for 3 reps,  then full revolutions forward for 2 mins.   Gastroc stretch on incline board 30 sec hold 3 reps PT recommended exercising 1-2 exercises for ~5 minutes every hour that she is awake to limit muscle soreness and stiffness.  Pt verbalized understanding.  Seated RLE heel slide with foot on washcloth (reduce resistance) ext / quad set 5 sec and flexion with end range assist LLE 5 sec 10 reps Supine RLE heel slide with strap & ball (reduce resistance) ext / quad set 5 sec and flexion with end range assist LLE 5 sec 10 reps  Therapeutic Activities: Weight shift onto RLE ~3 sec 5 reps upon arising.  Pt had less initial sharp pains causing antalgic pattern.  Leg press BLEs 75# 10 reps 3 sets working on sit to/from stand function. PT instructed with demo & verbal cues on using 24 bar stool for standing ADLs including sit to/from standing.  Pt verbalized and return demo understanding.   Manual: PROM Rt knee flexion and extension with overpressure Seated Rt knee flexion with IR and distraction   TREATMENT DATE:  05/16/24 TherAct NuStep L3 x 8 min; UE/LEs working on ROM and strength Seated tailgate flexion x 2 min Reciprocal LAQ 2x10 with 5 sec holds at end ranges; 3# on Rt Sit to/from stand x 10 reps; without UE support from elevated surface Quad set in heel prop for muscle activation and ROM 2x10; 5 sec hold  Manual PROM Rt knee flexion and extension with overpressure Seated Rt  knee flexion with IR and distraction  Modalities Vaso x 10 min; mod pressure to Rt knee; 34 deg  05/13/24:  TherEx Seated SLR: 2 x 10 Seated LAQ: 2 x 10 c 2#  Heel slides with strap and ball under foot x 5 minutes SAQ: x 20 holding 5 sec TherActivites:  Nustep: Level 5 x 9 minutes, UE/LE for ROM, strengthening and endurance Sit to stand: x 2 c UE support Manual:  PROM Rt knee flexion and extension with overpressure Seated Rt knee flexion with IR and distraction Vasopneumatic:  34 deg, medium compression, x 10  minutes, LE elevated on bolster   PATIENT EDUCATION:  Education details: See above Person educated: Patient Education method: Explanation, Demonstration, Tactile cues, Verbal cues, and Handouts Education comprehension: verbalized understanding, returned demonstration, verbal cues required, tactile cues required, and needs further education  HOME EXERCISE PROGRAM: Access Code: [email or web address] URL: [email or web address] Date: 05/09/2024 Prepared by: Lamar Ivory  Exercises - Seated Knee Flexion Extension AAROM with Overpressure  - 10 x daily - 7 x weekly - 1 sets - 10 reps - 3 seconds hold - Seated Knee Flexion AAROM  - 10 x daily - 7 x weekly - 1 sets - 10 reps - 10 seconds hold - Supine Quadricep Sets  - 10 x daily - 7 x weekly - 10 sets - 10 reps - 5 second hold  ASSESSMENT:  CLINICAL IMPRESSION: Patient became sick during session due to taking meds & vitamins with little to eat but verbalized understanding of PT recommendations.  She is improving range and function with knee motions with less pain.    Will continue to benefit from PT to maximize function.  OBJECTIVE IMPAIRMENTS: Abnormal gait, decreased activity tolerance, decreased endurance, decreased knowledge of condition, difficulty walking, decreased ROM, decreased strength, increased edema, and pain.   ACTIVITY LIMITATIONS: bending, sitting, standing, squatting, sleeping, stairs, transfers, bed mobility, dressing, and locomotion level  PARTICIPATION LIMITATIONS: community activity and yard work  PERSONAL FACTORS: Lt breast cancer, kidney stones, HTN, Lt TSA 2019 are also affecting patient's functional outcome.   REHAB POTENTIAL: Good  CLINICAL DECISION MAKING: Stable/uncomplicated  EVALUATION COMPLEXITY: Low   GOALS: Goals reviewed with patient? Yes  SHORT TERM GOALS: Target date: 06/20/2024 Shameeka will be independent with her day 1 home exercise program Baseline: Started 05/09/2024 Goal status:   Ongoing    05/20/2024  2.  Improve right knee active range of motion to 3 - 0 -95 degrees Baseline: 7 - 0 - 52 degrees Goal status: Ongoing   05/20/2024  3.  Improve right quadriceps strength to at least 30 pounds Baseline: 5.9 pounds Goal status: Ongoing   05/20/2024   LONG TERM GOALS: Target date: 08/01/2024  Improve patient specific functional score to at least 5 Baseline: 0.67 Goal status: Ongoing   05/20/2024  2.  Jhane will report right knee pain consistently 3/10 or better on the numeric pain rating scale Baseline: Can be as high as 8/10 Goal status: Ongoing   05/20/2024  3.  Improve right knee AROM to 2 - 0 -110 degrees Baseline: 7 - 0 - 52 degrees Goal status: Ongoing   05/20/2024  4.  Improve bilateral quadriceps strength to 60 pounds or better Baseline: See objective Goal status: Ongoing   05/20/2024  5.  Rowan will be comfortable without the use of an assistive device within the house and with no more than a single-point cane outside the house at transfer into independent rehabilitation Baseline: 2 wheeled walker full-time Goal  status: Ongoing   05/20/2024  6.  We will be independent with a long-term home maintenance exercise program at discharge Baseline: Started 05/09/2024 Goal status: Ongoing   05/20/2024   PLAN:  PT FREQUENCY: 2-3 times a week  PT DURATION: 12 weeks  PLANNED INTERVENTIONS: 97750- Physical Performance Testing, 97110-Therapeutic exercises, 97530- Therapeutic activity, 97112- Neuromuscular re-education, 97535- Self Care, 02859- Manual therapy, [phone]- Gait training, [phone]- Electrical stimulation (unattended), [id number]- Vasopneumatic device, Patient/Family education, Balance training, Stair training, Joint mobilization, and Cryotherapy  PLAN FOR NEXT SESSION:  therapeutic exercise and activities to progress range and functional strength, manual therapy, gait training with cane, vaso for edema    Grayce Spatz, PT, DPT 05/21/2024, 9:28 AM

## 2024-05-26 ENCOUNTER — Ambulatory Visit (INDEPENDENT_AMBULATORY_CARE_PROVIDER_SITE_OTHER): Admitting: Physical Therapy

## 2024-05-26 ENCOUNTER — Encounter: Payer: Self-pay | Admitting: Physical Therapy

## 2024-05-26 DIAGNOSIS — M6281 Muscle weakness (generalized): Secondary | ICD-10-CM

## 2024-05-26 DIAGNOSIS — M25561 Pain in right knee: Secondary | ICD-10-CM

## 2024-05-26 DIAGNOSIS — R600 Localized edema: Secondary | ICD-10-CM

## 2024-05-26 DIAGNOSIS — M25661 Stiffness of right knee, not elsewhere classified: Secondary | ICD-10-CM

## 2024-05-26 DIAGNOSIS — R262 Difficulty in walking, not elsewhere classified: Secondary | ICD-10-CM | POA: Diagnosis not present

## 2024-05-26 NOTE — Therapy (Signed)
 OUTPATIENT PHYSICAL THERAPY TREATMENT   Patient Name: Brooke Cantrell MRN: [id number] DOB:23-Feb-1946, 78 y.o., female Today's Date: 05/26/2024  END OF SESSION:  PT End of Session - 05/26/24 0935     Visit Number 6    Number of Visits 24    Date for PT Re-Evaluation 08/01/24    Authorization Type MEDICARE    Progress Note Due on Visit 10    PT Start Time 0930    PT Stop Time 1027    PT Time Calculation (min) 57 min    Activity Tolerance Patient tolerated treatment well;No increased pain;Patient limited by pain    Behavior During Therapy [facility] for tasks assessed/performed               Past Medical History:  Diagnosis Date   Arthritis    Breast cancer (HCC)    Left breast   Depression    mild, no meds   History of kidney stones    Hypertension    Past Surgical History:  Procedure Laterality Date   breast cancer surgery     BREAST ENHANCEMENT SURGERY     BREAST LUMPECTOMY Left    CATARACT EXTRACTION, BILATERAL     ECTOPIC PREGNANCY SURGERY     EYE SURGERY     INNER EAR SURGERY     TONSILLECTOMY     TOTAL KNEE ARTHROPLASTY Right 04/22/2024   Procedure: ARTHROPLASTY, KNEE, TOTAL;  Surgeon: Vernetta Lonni GRADE, MD;  Location: [facility] OR;  Service: Orthopedics;  Laterality: Right;   TOTAL SHOULDER ARTHROPLASTY Left 04/25/2018   TOTAL SHOULDER ARTHROPLASTY Left 04/25/2018   Procedure: LEFT TOTAL SHOULDER REPLACEMENT;  Surgeon: Addie Cordella Hamilton, MD;  Location: [facility] OR;  Service: Orthopedics;  Laterality: Left;   TUBAL LIGATION     Patient Active Problem List   Diagnosis Date Noted   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   Weakness    Hyperlipidemia    Acute respiratory failure due to COVID-19 (HCC) 06/10/2020   Pneumonia due to COVID-19 virus 06/10/2020   Hx of multiple pulmonary nodules 06/10/2020   Acute respiratory failure with hypoxia (HCC)    Lung nodule seen on imaging study 01/08/2020   History of breast cancer 04/14/2019   Sepsis (HCC)  04/02/2019   CAP (community acquired pneumonia) 04/02/2019   Elevated troponin 04/02/2019   Shoulder arthritis 04/25/2018   Primary osteoarthritis, 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   Chronic left shoulder pain 05/17/2017   Cervical disc disorder with radiculopathy 08/14/2016   Chronic infection of sinus 08/31/2015   Cough 08/31/2015   Allergic reaction 08/31/2015    PCP: Alda Carpen, MD  REFERRING PROVIDER: Lonni GRADE Vernetta, MD  REFERRING DIAG: [phone] (ICD-10-CM) - Status post total right knee replacement  THERAPY DIAG:  Difficulty in walking, not elsewhere classified  Muscle weakness (generalized)  Localized edema  Stiffness of right knee, not elsewhere classified  Acute pain of right knee  Rationale for Evaluation and Treatment: Rehabilitation  ONSET DATE: 04/22/2024  SUBJECTIVE:   SUBJECTIVE STATEMENT: She is still having trouble sleeping. She tried bed, recliner and couch.  She is not napping.  She has been breaking up exercises doing some hourly which has helped.    PERTINENT HISTORY: Lt breast cancer, kidney stones, HTN, Lt TSA 2019  PAIN:  Are you having pain? Yes: NPRS scale:  in last week  at rest  0-2/10 at night aches enough to interrupt sleep,  standing / walking 0-1/10,  exercising / bending up to 7-8/10 Pain  location: Rt knee Pain description: Stiff, achy, occasional sharp Aggravating factors: Prolonged postures and too much WB Relieving factors: Pain meds, ice, change positions frequently  PRECAUTIONS: None  RED FLAGS: None   WEIGHT BEARING RESTRICTIONS: No  FALLS:  Has patient fallen in last 6 months? No  LIVING ENVIRONMENT: Lives with: lives with their family and grandson Lives in: House/apartment Stairs: Uses the rail with steps Has following equipment at home: Single point cane and Environmental consultant - 2 wheeled  OCCUPATION: Retired  PLOF: Independent  PATIENT GOALS: Return to gardening her acre lot, weed, plant in the garden  NEXT MD  VISIT: 06/04/2024 at 8:30 AM  OBJECTIVE:  Note: Objective measures were completed at Evaluation unless otherwise noted.  DIAGNOSTIC FINDINGS: Right knee arthroplasty in expected alignment. No periprosthetic lucency or fracture. There has been patellar resurfacing. Recent postsurgical change includes air and edema in the soft tissues and joint space. Anterior skin staples in place.  PATIENT SURVEYS:  PSFS: THE PATIENT SPECIFIC FUNCTIONAL SCALE  Place score of 0-10 (0 = unable to perform activity and 10 = able to perform activity at the same level as before injury or problem)  Activity Date:  05/09/2024    Walk 2    2.  Garden 0    3.  Safely transfer to and from the ground 0    4.      Total Score 0.67      Total Score = Sum of activity scores/number of activities  Minimally Detectable Change: 3 points (for single activity); 2 points (for average score)  [name] [facility] (nd). The Patient Specific Functional Scale . Retrieved from [email or web address]   COGNITION: Overall cognitive status: Within functional limits for tasks assessed     SENSATION: WFL  EDEMA:  Noted and not objectively assessed  LOWER EXTREMITY ROM:   ROM Left/Right 05/09/2024 Rt 05/13/24 Right 05/20/24 Right 05/21/24 Right 05/26/24  Hip flexion       Hip extension       Hip abduction       Hip adduction       Hip internal rotation       Hip external rotation       Knee flexion 145/52 Supine:  A:  P:  Sitting:  P: 70 Supine AA: 76* Seated P: 78* Standing  P: flexion stretch 82*  Knee extension 0/7      Ankle dorsiflexion       Ankle plantarflexion       Ankle inversion       Ankle eversion        (Blank rows = not tested)  LOWER EXTREMITY STRENGTH:  In pounds with hand-held dynamometer Left/Right 05/09/2024  Hip flexion   Hip extension   Hip abduction   Hip adduction   Hip internal rotation   Hip external rotation   Knee  flexion   Knee extension 49.7/5.9 pounds  Ankle dorsiflexion   Ankle plantarflexion   Ankle inversion   Ankle eversion    (Blank rows = not tested)  GAIT: Distance walked: 50 feet Assistive device utilized: Environmental consultant - 2 wheeled Level of assistance: Modified independence Comments: Alishba is using her walker full-time at this point   TREATMENT DATE:  05/26/2024 Therapeutic Exercise: Sci Fit bike using BLEs & BUEs seat 9 level 1 partial revolutions for flexion stretch for 6 minutes, PT manual assisted full revolutions for 3 reps, then full revolutions forward for 2 mins.  Attempted no UE support near end but too  painful for quad.  Seated RLE LAQ & active knee flexion with LLE opposing motion for 15 reps.  Standing knee flexion stretch with RLE on chair against wall (BUE support on RW & 2nd chair) 10 sec hold for flexion stretch 3 reps 3 sets moving stance LE closer to chair every set to increase stretch.   Therapeutic Activities: Standing Terminal Knee Extension green theraband 5 sec hold 15 reps with RW support. PT working on knee extension for stance.   Self-Care: PT demo & verbal cues on position in bed supine & side lying with pillows. Pt verbalized & return demo understanding.   Gait Training: Pt amb 75' and around clinic with cane safely.  PT recommended starting use in home where distances are limited.  Pt verbalized understanding.   Manual: PROM Rt knee flexion and extension with overpressure and contract - relax technique Seated Rt knee flexion with IR and distraction  Vaso Rt knee with elevation towel roll ext stretch 10 min medium compression 34*    TREATMENT DATE:  05/21/2024 Therapeutic Exercise: Sci Fit bike using BLEs & BUEs seat 9 level 1 partial revolutions for flexion stretch for 4 minutes, PT manual assisted full revolutions for 3 reps, then full revolutions forward for 4 mins.  Attempted no UE support near end but too painful for quad.  Gastroc stretch on incline  board 30 sec hold 3 reps and BLE heel raises bw stretches 10 reps 2 sets Seated RLE LAQ & active knee flexion with LLE opposing motion for 15 reps.   Therapeutic Activities: Weight shift onto RLE ~3 sec 5 reps upon arising.  Pt had less initial sharp pains causing antalgic pattern.  Leg press BLEs 75# 10 reps 2 sets & RLE only 37# 10 reps 1 set working on sit to/from stand function. PT reviewed use of 24 bar stool for standing ADLs.  Self-Care: PT recommended elevation with LE higher than heart for >15 min >/= 2x/day.  Ankle exercises 1 min 3-5 reps while elevated. Pt verbalized understanding.   Gait Training: PT cued pt on cane use.  Pt amb 75' and around clinic with cane with light HHA with cues on step thru pattern and sequencing.   Manual: PROM Rt knee flexion and extension with overpressure and contract - relax technique Seated Rt knee flexion with IR and distraction  Vaso Rt knee with elevation towel roll ext stretch 10 min medium compression 34*                                                                                                                               TREATMENT DATE:  05/20/2024 Therapeutic Exercise: Sci Fit bike using BLEs & BUEs seat 9 level 1 partial revolutions for flexion stretch for 6 minutes, PT manual assisted full revolutions for 3 reps, then full revolutions forward for 2 mins.   Gastroc stretch on incline board 30 sec hold 3 reps PT recommended exercising 1-2 exercises  for ~5 minutes every hour that she is awake to limit muscle soreness and stiffness.  Pt verbalized understanding.  Seated RLE heel slide with foot on washcloth (reduce resistance) ext / quad set 5 sec and flexion with end range assist LLE 5 sec 10 reps Supine RLE heel slide with strap & ball (reduce resistance) ext / quad set 5 sec and flexion with end range assist LLE 5 sec 10 reps  Therapeutic Activities: Weight shift onto RLE ~3 sec 5 reps upon arising.  Pt had less initial sharp  pains causing antalgic pattern.  Leg press BLEs 75# 10 reps 3 sets working on sit to/from stand function. PT instructed with demo & verbal cues on using 24 bar stool for standing ADLs including sit to/from standing.  Pt verbalized and return demo understanding.   Manual: PROM Rt knee flexion and extension with overpressure Seated Rt knee flexion with IR and distraction   TREATMENT DATE:  05/16/24 TherAct NuStep L3 x 8 min; UE/LEs working on ROM and strength Seated tailgate flexion x 2 min Reciprocal LAQ 2x10 with 5 sec holds at end ranges; 3# on Rt Sit to/from stand x 10 reps; without UE support from elevated surface Quad set in heel prop for muscle activation and ROM 2x10; 5 sec hold  Manual PROM Rt knee flexion and extension with overpressure Seated Rt knee flexion with IR and distraction  Modalities Vaso x 10 min; mod pressure to Rt knee; 34 deg    PATIENT EDUCATION:  Education details: See above Person educated: Patient Education method: Explanation, Demonstration, Tactile cues, Verbal cues, and Handouts Education comprehension: verbalized understanding, returned demonstration, verbal cues required, tactile cues required, and needs further education  HOME EXERCISE PROGRAM: Access Code: [email or web address] URL: [email or web address] Date: 05/26/2024 Prepared by: Grayce Spatz  Exercises - Seated Knee Flexion AAROM  - 10 x daily - 7 x weekly - 1 sets - 10 reps - 10 seconds hold - Supine Quadricep Sets  - 10 x daily - 7 x weekly - 10 sets - 10 reps - 5 second hold - Seated Knee Flexion Extension AROM   - 2-4 x daily - 7 x weekly - 2-3 sets - 10 reps - 5 seconds hold - Seated Knee Flexion Extension AAROM with Overpressure  - 2-4 x daily - 7 x weekly - 2-3 sets - 10 reps - 5 seconds hold - Seated Long Arc Quad  - 2-4 x daily - 7 x weekly - 2-3 sets - 10 reps - 5 seconds hold - Supine Heel Slide with Strap  - 2-4 x daily - 7 x weekly - 2-3 sets - 10 reps - 5 seconds  hold - Standing Knee Flexion Stretch on Step  - 2-3 x daily - 7 x weekly - 3-5 sets - 3 reps - 10 seconds hold  ASSESSMENT:  CLINICAL IMPRESSION: Patient is slowly improving range but is limited by pain.  She reports following PT recommendations outside of PT and they seem to help.    Will continue to benefit from PT to maximize function.  OBJECTIVE IMPAIRMENTS: Abnormal gait, decreased activity tolerance, decreased endurance, decreased knowledge of condition, difficulty walking, decreased ROM, decreased strength, increased edema, and pain.   ACTIVITY LIMITATIONS: bending, sitting, standing, squatting, sleeping, stairs, transfers, bed mobility, dressing, and locomotion level  PARTICIPATION LIMITATIONS: community activity and yard work  PERSONAL FACTORS: Lt breast cancer, kidney stones, HTN, Lt TSA 2019 are also affecting patient's functional outcome.   REHAB POTENTIAL: Good  CLINICAL DECISION  MAKING: Stable/uncomplicated  EVALUATION COMPLEXITY: Low   GOALS: Goals reviewed with patient? Yes  SHORT TERM GOALS: Target date: 06/20/2024 Nychelle will be independent with her day 1 home exercise program Baseline: Started 05/09/2024 Goal status:   Ongoing    05/26/2024  2.  Improve right knee active range of motion to 3 - 0 -95 degrees Baseline: 7 - 0 - 52 degrees Goal status: Ongoing    05/26/2024  3.  Improve right quadriceps strength to at least 30 pounds Baseline: 5.9 pounds Goal status: Ongoing    05/26/2024   LONG TERM GOALS: Target date: 08/01/2024  Improve patient specific functional score to at least 5 Baseline: 0.67 Goal status: Ongoing    05/26/2024  2.  Zhuri will report right knee pain consistently 3/10 or better on the numeric pain rating scale Baseline: Can be as high as 8/10 Goal status: Ongoing    05/26/2024  3.  Improve right knee AROM to 2 - 0 -110 degrees Baseline: 7 - 0 - 52 degrees Goal status: Ongoing    05/26/2024  4.  Improve bilateral quadriceps strength to  60 pounds or better Baseline: See objective Goal status: Ongoing   05/26/2024  5.  Cynitha will be comfortable without the use of an assistive device within the house and with no more than a single-point cane outside the house at transfer into independent rehabilitation Baseline: 2 wheeled walker full-time Goal status: Ongoing   05/26/2024  6.  We will be independent with a long-term home maintenance exercise program at discharge Baseline: Started 05/09/2024 Goal status: Ongoing   05/26/2024   PLAN:  PT FREQUENCY: 2-3 times a week  PT DURATION: 12 weeks  PLANNED INTERVENTIONS: 97750- Physical Performance Testing, 97110-Therapeutic exercises, 97530- Therapeutic activity, 97112- Neuromuscular re-education, 97535- Self Care, 02859- Manual therapy, [phone]- Gait training, [phone]- Electrical stimulation (unattended), [id number]- Vasopneumatic device, Patient/Family education, Balance training, Stair training, Joint mobilization, and Cryotherapy  PLAN FOR NEXT SESSION:  check if sleeping better, continue with therapeutic exercise and activities to progress range and functional strength, manual therapy, gait training with cane for ramp & curb, vaso for edema    Grayce Spatz, PT, DPT 05/26/2024, 10:47 AM

## 2024-05-28 ENCOUNTER — Ambulatory Visit (INDEPENDENT_AMBULATORY_CARE_PROVIDER_SITE_OTHER): Admitting: Physical Therapy

## 2024-05-28 ENCOUNTER — Encounter: Payer: Self-pay | Admitting: Physical Therapy

## 2024-05-28 DIAGNOSIS — R262 Difficulty in walking, not elsewhere classified: Secondary | ICD-10-CM

## 2024-05-28 DIAGNOSIS — M6281 Muscle weakness (generalized): Secondary | ICD-10-CM

## 2024-05-28 DIAGNOSIS — R600 Localized edema: Secondary | ICD-10-CM | POA: Diagnosis not present

## 2024-05-28 DIAGNOSIS — M25661 Stiffness of right knee, not elsewhere classified: Secondary | ICD-10-CM | POA: Diagnosis not present

## 2024-05-28 DIAGNOSIS — M25561 Pain in right knee: Secondary | ICD-10-CM

## 2024-05-28 NOTE — Therapy (Signed)
 OUTPATIENT PHYSICAL THERAPY TREATMENT   Patient Name: Brooke Cantrell MRN: [id number] DOB:1946/08/21, 78 y.o., female Today's Date: 05/28/2024  END OF SESSION:  PT End of Session - 05/28/24 0923     Visit Number 7    Number of Visits 24    Date for PT Re-Evaluation 08/01/24    Authorization Type MEDICARE    Progress Note Due on Visit 10    PT Start Time [phone]    PT Stop Time 1025    PT Time Calculation (min) 62 min    Activity Tolerance Patient tolerated treatment well;No increased pain;Patient limited by pain    Behavior During Therapy [facility] for tasks assessed/performed                Past Medical History:  Diagnosis Date   Arthritis    Breast cancer (HCC)    Left breast   Depression    mild, no meds   History of kidney stones    Hypertension    Past Surgical History:  Procedure Laterality Date   breast cancer surgery     BREAST ENHANCEMENT SURGERY     BREAST LUMPECTOMY Left    CATARACT EXTRACTION, BILATERAL     ECTOPIC PREGNANCY SURGERY     EYE SURGERY     INNER EAR SURGERY     TONSILLECTOMY     TOTAL KNEE ARTHROPLASTY Right 04/22/2024   Procedure: ARTHROPLASTY, KNEE, TOTAL;  Surgeon: Vernetta Lonni GRADE, MD;  Location: [facility] OR;  Service: Orthopedics;  Laterality: Right;   TOTAL SHOULDER ARTHROPLASTY Left 04/25/2018   TOTAL SHOULDER ARTHROPLASTY Left 04/25/2018   Procedure: LEFT TOTAL SHOULDER REPLACEMENT;  Surgeon: Addie Cordella Hamilton, MD;  Location: [facility] OR;  Service: Orthopedics;  Laterality: Left;   TUBAL LIGATION     Patient Active Problem List   Diagnosis Date Noted   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   Weakness    Hyperlipidemia    Acute respiratory failure due to COVID-19 (HCC) 06/10/2020   Pneumonia due to COVID-19 virus 06/10/2020   Hx of multiple pulmonary nodules 06/10/2020   Acute respiratory failure with hypoxia (HCC)    Lung nodule seen on imaging study 01/08/2020   History of breast cancer 04/14/2019   Sepsis (HCC)  04/02/2019   CAP (community acquired pneumonia) 04/02/2019   Elevated troponin 04/02/2019   Shoulder arthritis 04/25/2018   Primary osteoarthritis, 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   Chronic left shoulder pain 05/17/2017   Cervical disc disorder with radiculopathy 08/14/2016   Chronic infection of sinus 08/31/2015   Cough 08/31/2015   Allergic reaction 08/31/2015    PCP: Alda Carpen, MD  REFERRING PROVIDER: Lonni GRADE Vernetta, MD  REFERRING DIAG: [id number] (ICD-10-CM) - Status post total right knee replacement  THERAPY DIAG:  Difficulty in walking, not elsewhere classified  Muscle weakness (generalized)  Localized edema  Stiffness of right knee, not elsewhere classified  Acute pain of right knee  Rationale for Evaluation and Treatment: Rehabilitation  ONSET DATE: 04/22/2024  SUBJECTIVE:   SUBJECTIVE STATEMENT: She slept better 2 nights ago but not last night   PERTINENT HISTORY: Lt breast cancer, kidney stones, HTN, Lt TSA 2019  PAIN:  Are you having pain? Yes: NPRS scale:  in last week  at rest  0-2/10 at night aches enough to interrupt sleep,  standing / walking 0-1/10,  exercising / bending up to 7-8/10 Pain location: Rt knee Pain description: Stiff, achy, occasional sharp Aggravating factors: Prolonged postures and too much WB Relieving factors: Pain  meds, ice, change positions frequently  PRECAUTIONS: None  RED FLAGS: None   WEIGHT BEARING RESTRICTIONS: No  FALLS:  Has patient fallen in last 6 months? No  LIVING ENVIRONMENT: Lives with: lives with their family and grandson Lives in: House/apartment Stairs: Uses the rail with steps Has following equipment at home: Single point cane and Environmental consultant - 2 wheeled  OCCUPATION: Retired  PLOF: Independent  PATIENT GOALS: Return to gardening her acre lot, weed, plant in the garden  NEXT MD VISIT: 06/04/2024 at 8:30 AM  OBJECTIVE:  Note: Objective measures were completed at Evaluation unless otherwise  noted.  DIAGNOSTIC FINDINGS: Right knee arthroplasty in expected alignment. No periprosthetic lucency or fracture. There has been patellar resurfacing. Recent postsurgical change includes air and edema in the soft tissues and joint space. Anterior skin staples in place.  PATIENT SURVEYS:  PSFS: THE PATIENT SPECIFIC FUNCTIONAL SCALE  Place score of 0-10 (0 = unable to perform activity and 10 = able to perform activity at the same level as before injury or problem)  Activity Date:  05/09/2024    Walk 2    2.  Garden 0    3.  Safely transfer to and from the ground 0    4.      Total Score 0.67      Total Score = Sum of activity scores/number of activities  Minimally Detectable Change: 3 points (for single activity); 2 points (for average score)  [facility] (nd). The Patient Specific Functional Scale . Retrieved from [email or web address]   COGNITION: Overall cognitive status: Within functional limits for tasks assessed     SENSATION: WFL  EDEMA:  Noted and not objectively assessed  LOWER EXTREMITY ROM:   ROM Left/Right 05/09/2024 Rt 05/13/24 Right 05/20/24 Right 05/21/24 Right 05/26/24 Right 05/28/24  Hip flexion        Hip extension        Hip abduction        Hip adduction        Hip internal rotation        Hip external rotation        Knee flexion 145/52 Supine:  A:  P:  Sitting:  P: 70 Supine AA: 76* Seated P: 78* Standing  P: flexion stretch 82* Seated P: 87*  Knee extension 0/7     Seated P: -12*  LAQ  Ankle dorsiflexion        Ankle plantarflexion        Ankle inversion        Ankle eversion         (Blank rows = not tested)  LOWER EXTREMITY STRENGTH:  In pounds with hand-held dynamometer Left/Right 05/09/2024  Hip flexion   Hip extension   Hip abduction   Hip adduction   Hip internal rotation   Hip external rotation   Knee flexion   Knee extension 49.7/5.9 pounds  Ankle  dorsiflexion   Ankle plantarflexion   Ankle inversion   Ankle eversion    (Blank rows = not tested)  GAIT: Distance walked: 50 feet Assistive device utilized: Environmental consultant - 2 wheeled Level of assistance: Modified independence Comments: Brooke Cantrell is using her walker full-time at this point   TREATMENT DATE:  05/28/2024 Therapeutic Exercise: Sci Fit bike using BLEs & BUEs seat 9 level 1 partial revolutions for flexion stretch for 4 minutes, PT manual assisted full revolutions for 3 reps, then full revolutions backward for 2 mins, then forward 2 mins using BLEs & BUES, last  2 min with BLEs only.  Gastroc stretch on step heel depression 30 sec 2 reps /forefoot on 2 block 1 reps and heel raises BLEs 10 reps 2 sets bw stretches. Seated RLE LAQ & active knee flexion with LLE opposing motion for 10 reps.  Supine active knee flexion with thigh held vertical 5 sec hold 10 reps.  Seated hamstring stretch with strap 30 sec hold 3 reps. Supine gravity assisted knee flexion (thigh held vertical) 10 sec hold 10 reps    Therapeutic Activities: Sit to/from stand with PT demo & verbal cues on technique to engage RLE. Pt performed 5 reps.  Standing Terminal Knee Extension green theraband 5 sec hold 10 reps 2 sets (2nd set added SLS)  with cane support. PT working on knee extension for stance.   Neuromuscular Re-education Tandem stance 1st rep RLE in front & 2nd rep RLE in back; 1st set floor eyes open,  2nd set on floor eyes closed intermittent touch  Gait: Pt arrived ambulating with cane that she purchased.  PT adjusted height.    Manual: PROM Rt knee flexion and extension with overpressure and contract - relax technique Seated Rt knee flexion with IR and distraction  Vaso Rt knee with elevation towel roll ext stretch 10 min medium compression 34* While on Vaso PT updated HEP with HO, verbal & demo cues.  Pt verbalized understanding.      TREATMENT DATE:  05/26/2024 Therapeutic Exercise: Sci Fit  bike using BLEs & BUEs seat 9 level 1 partial revolutions for flexion stretch for 6 minutes, PT manual assisted full revolutions for 3 reps, then full revolutions forward for 2 mins.  Attempted no UE support near end but too painful for quad.  Seated RLE LAQ & active knee flexion with LLE opposing motion for 15 reps.  Standing knee flexion stretch with RLE on chair against wall (BUE support on RW & 2nd chair) 10 sec hold for flexion stretch 3 reps 3 sets moving stance LE closer to chair every set to increase stretch.  Supine gravity assisted knee flexion (femur held vertical) 10 sec knee flexion 10 reps.   Therapeutic Activities: Standing Terminal Knee Extension green theraband 5 sec hold 15 reps with RW support. PT working on knee extension for stance.   Self-Care: PT demo & verbal cues on position in bed supine & side lying with pillows. Pt verbalized & return demo understanding.   Gait Training: Pt amb 75' and around clinic with cane safely.  PT recommended starting use in home where distances are limited.  Pt verbalized understanding.   Manual: PROM Rt knee flexion and extension with overpressure and contract - relax technique Seated Rt knee flexion with IR and distraction  Vaso Rt knee with elevation towel roll ext stretch 10 min medium compression 34*    TREATMENT DATE:  05/21/2024 Therapeutic Exercise: Sci Fit bike using BLEs & BUEs seat 9 level 1 partial revolutions for flexion stretch for 4 minutes, PT manual assisted full revolutions for 3 reps, then full revolutions forward for 4 mins.  Attempted no UE support near end but too painful for quad.  Gastroc stretch on incline board 30 sec hold 3 reps and BLE heel raises bw stretches 10 reps 2 sets Seated RLE LAQ & active knee flexion with LLE opposing motion for 15 reps.   Therapeutic Activities: Weight shift onto RLE ~3 sec 5 reps upon arising.  Pt had less initial sharp pains causing antalgic pattern.  Leg press BLEs 75# 10  reps 2 sets & RLE only 37# 10 reps 1 set working on sit to/from stand function. PT reviewed use of 24 bar stool for standing ADLs.  Self-Care: PT recommended elevation with LE higher than heart for >15 min >/= 2x/day.  Ankle exercises 1 min 3-5 reps while elevated. Pt verbalized understanding.   Gait Training: PT cued pt on cane use.  Pt amb 75' and around clinic with cane with light HHA with cues on step thru pattern and sequencing.   Manual: PROM Rt knee flexion and extension with overpressure and contract - relax technique Seated Rt knee flexion with IR and distraction  Vaso Rt knee with elevation towel roll ext stretch 10 min medium compression 34*                                                                                                                               TREATMENT DATE:  05/20/2024 Therapeutic Exercise: Sci Fit bike using BLEs & BUEs seat 9 level 1 partial revolutions for flexion stretch for 6 minutes, PT manual assisted full revolutions for 3 reps, then full revolutions forward for 2 mins.   Gastroc stretch on incline board 30 sec hold 3 reps PT recommended exercising 1-2 exercises for ~5 minutes every hour that she is awake to limit muscle soreness and stiffness.  Pt verbalized understanding.  Seated RLE heel slide with foot on washcloth (reduce resistance) ext / quad set 5 sec and flexion with end range assist LLE 5 sec 10 reps Supine RLE heel slide with strap & ball (reduce resistance) ext / quad set 5 sec and flexion with end range assist LLE 5 sec 10 reps  Therapeutic Activities: Weight shift onto RLE ~3 sec 5 reps upon arising.  Pt had less initial sharp pains causing antalgic pattern.  Leg press BLEs 75# 10 reps 3 sets working on sit to/from stand function. PT instructed with demo & verbal cues on using 24 bar stool for standing ADLs including sit to/from standing.  Pt verbalized and return demo understanding.   Manual: PROM Rt knee flexion and extension  with overpressure Seated Rt knee flexion with IR and distraction   TREATMENT DATE:  05/16/24 TherAct NuStep L3 x 8 min; UE/LEs working on ROM and strength Seated tailgate flexion x 2 min Reciprocal LAQ 2x10 with 5 sec holds at end ranges; 3# on Rt Sit to/from stand x 10 reps; without UE support from elevated surface Quad set in heel prop for muscle activation and ROM 2x10; 5 sec hold  Manual PROM Rt knee flexion and extension with overpressure Seated Rt knee flexion with IR and distraction  Modalities Vaso x 10 min; mod pressure to Rt knee; 34 deg    PATIENT EDUCATION:  Education details: See above Person educated: Patient Education method: Explanation, Demonstration, Tactile cues, Verbal cues, and Handouts Education comprehension: verbalized understanding, returned demonstration, verbal cues required, tactile cues required, and needs further education  HOME EXERCISE  PROGRAM: Access Code: [email or web address] URL: [email or web address] Date: 05/28/2024 Prepared by: Grayce Spatz  Exercises - Seated Knee Flexion AAROM  - 10 x daily - 7 x weekly - 1 sets - 10 reps - 10 seconds hold - Supine Quadricep Sets  - 10 x daily - 7 x weekly - 10 sets - 10 reps - 5 second hold - Seated Knee Flexion Extension AROM   - 2-4 x daily - 7 x weekly - 2-3 sets - 10 reps - 5 seconds hold - Seated Knee Flexion Extension AAROM with Overpressure  - 2-4 x daily - 7 x weekly - 2-3 sets - 10 reps - 5 seconds hold - Seated Long Arc Quad  - 2-4 x daily - 7 x weekly - 2-3 sets - 10 reps - 5 seconds hold - Supine Heel Slide with Strap  - 2-4 x daily - 7 x weekly - 2-3 sets - 10 reps - 5 seconds hold - Standing Knee Flexion Stretch on Step  - 2-3 x daily - 7 x weekly - 3-5 sets - 3 reps - 10 seconds hold - standing calf stretch with forefoot on small step or brick  - 1-2 x daily - 7 x weekly - 1 sets - 3 reps - 30 seconds hold - Standing Heel Raise  - 1-2 x daily - 7 x weekly - 2-3 sets - 10 reps - 2-3  seconds hold - Tandem Stance  - 1 x daily - 7 x weekly - 3 sets - 2 reps - 30 seconds hold - Seated Hamstring Stretch with Strap  - 1-2 x daily - 7 x weekly - 1 sets - 3 reps - 30 seconds hold  ASSESSMENT:  CLINICAL IMPRESSION: Patient is slowly improving range and her pain seems lower.  She appears to understand the updated HEP.    Will continue to benefit from PT to maximize function.  OBJECTIVE IMPAIRMENTS: Abnormal gait, decreased activity tolerance, decreased endurance, decreased knowledge of condition, difficulty walking, decreased ROM, decreased strength, increased edema, and pain.   ACTIVITY LIMITATIONS: bending, sitting, standing, squatting, sleeping, stairs, transfers, bed mobility, dressing, and locomotion level  PARTICIPATION LIMITATIONS: community activity and yard work  PERSONAL FACTORS: Lt breast cancer, kidney stones, HTN, Lt TSA 2019 are also affecting patient's functional outcome.   REHAB POTENTIAL: Good  CLINICAL DECISION MAKING: Stable/uncomplicated  EVALUATION COMPLEXITY: Low   GOALS: Goals reviewed with patient? Yes  SHORT TERM GOALS: Target date: 06/20/2024 Brooke Cantrell will be independent with her day 1 home exercise program Baseline: Started 05/09/2024 Goal status:   Ongoing    05/26/2024  2.  Improve right knee active range of motion to 3 - 0 -95 degrees Baseline: 7 - 0 - 52 degrees Goal status: Ongoing    05/26/2024  3.  Improve right quadriceps strength to at least 30 pounds Baseline: 5.9 pounds Goal status: Ongoing    05/26/2024   LONG TERM GOALS: Target date: 08/01/2024  Improve patient specific functional score to at least 5 Baseline: 0.67 Goal status: Ongoing    05/26/2024  2.  Brooke Cantrell will report right knee pain consistently 3/10 or better on the numeric pain rating scale Baseline: Can be as high as 8/10 Goal status: Ongoing    05/26/2024  3.  Improve right knee AROM to 2 - 0 -110 degrees Baseline: 7 - 0 - 52 degrees Goal status: Ongoing     05/26/2024  4.  Improve bilateral quadriceps strength to 60 pounds or better Baseline: See objective Goal  status: Ongoing   05/26/2024  5.  Brooke Cantrell will be comfortable without the use of an assistive device within the house and with no more than a single-point cane outside the house at transfer into independent rehabilitation Baseline: 2 wheeled walker full-time Goal status: Ongoing   05/26/2024  6.  We will be independent with a long-term home maintenance exercise program at discharge Baseline: Started 05/09/2024 Goal status: Ongoing   05/26/2024   PLAN:  PT FREQUENCY: 2-3 times a week  PT DURATION: 12 weeks  PLANNED INTERVENTIONS: 97750- Physical Performance Testing, 97110-Therapeutic exercises, 97530- Therapeutic activity, 97112- Neuromuscular re-education, 97535- Self Care, 02859- Manual therapy, [phone]- Gait training, [phone]- Electrical stimulation (unattended), [id number]- Vasopneumatic device, Patient/Family education, Balance training, Stair training, Joint mobilization, and Cryotherapy  PLAN FOR NEXT SESSION:  check updated HEP.   continue with therapeutic exercise and activities to progress range and functional strength, manual therapy, gait training with cane for ramp & curb, vaso for edema    Grayce Spatz, PT, DPT 05/28/2024, 11:19 AM

## 2024-05-29 NOTE — Therapy (Signed)
 OUTPATIENT PHYSICAL THERAPY TREATMENT   Patient Name: Brooke Cantrell MRN: [id number] DOB:July 03, 1946, 78 y.o., female Today's Date: 05/30/2024  END OF SESSION:  PT End of Session - 05/30/24 0937     Visit Number 8    Number of Visits 24    Date for Recertification  08/01/24    Authorization Type MEDICARE    Progress Note Due on Visit 10    PT Start Time [phone]    PT Stop Time 1024    PT Time Calculation (min) 47 min    Activity Tolerance Patient tolerated treatment well;No increased pain;Patient limited by pain    Behavior During Therapy [facility] for tasks assessed/performed                 Past Medical History:  Diagnosis Date   Arthritis    Breast cancer (HCC)    Left breast   Depression    mild, no meds   History of kidney stones    Hypertension    Past Surgical History:  Procedure Laterality Date   breast cancer surgery     BREAST ENHANCEMENT SURGERY     BREAST LUMPECTOMY Left    CATARACT EXTRACTION, BILATERAL     ECTOPIC PREGNANCY SURGERY     EYE SURGERY     INNER EAR SURGERY     TONSILLECTOMY     TOTAL KNEE ARTHROPLASTY Right 04/22/2024   Procedure: ARTHROPLASTY, KNEE, TOTAL;  Surgeon: Vernetta Lonni GRADE, MD;  Location: [facility] OR;  Service: Orthopedics;  Laterality: Right;   TOTAL SHOULDER ARTHROPLASTY Left 04/25/2018   TOTAL SHOULDER ARTHROPLASTY Left 04/25/2018   Procedure: LEFT TOTAL SHOULDER REPLACEMENT;  Surgeon: Addie Cordella Hamilton, MD;  Location: [facility] OR;  Service: Orthopedics;  Laterality: Left;   TUBAL LIGATION     Patient Active Problem List   Diagnosis Date Noted   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   Weakness    Hyperlipidemia    Acute respiratory failure due to COVID-19 (HCC) 06/10/2020   Pneumonia due to COVID-19 virus 06/10/2020   Hx of multiple pulmonary nodules 06/10/2020   Acute respiratory failure with hypoxia (HCC)    Lung nodule seen on imaging study 01/08/2020   History of breast cancer 04/14/2019   Sepsis (HCC)  04/02/2019   CAP (community acquired pneumonia) 04/02/2019   Elevated troponin 04/02/2019   Shoulder arthritis 04/25/2018   Primary osteoarthritis, 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   Chronic left shoulder pain 05/17/2017   Cervical disc disorder with radiculopathy 08/14/2016   Chronic infection of sinus 08/31/2015   Cough 08/31/2015   Allergic reaction 08/31/2015    PCP: Alda Carpen, MD  REFERRING PROVIDER: Lonni GRADE Vernetta, MD  REFERRING DIAG: [phone] (ICD-10-CM) - Status post total right knee replacement  THERAPY DIAG:  Difficulty in walking, not elsewhere classified  Muscle weakness (generalized)  Localized edema  Stiffness of right knee, not elsewhere classified  Acute pain of right knee  Rationale for Evaluation and Treatment: Rehabilitation  ONSET DATE: 04/22/2024  SUBJECTIVE:   SUBJECTIVE STATEMENT: Patient reports soreness and pain that keeps her from sleeping well some days. Achy and sore with knee flexion stretch rated 4-5/10.  PERTINENT HISTORY: Lt breast cancer, kidney stones, HTN, Lt TSA 2019  PAIN:  Are you having pain? Yes: NPRS scale:  in last week  at rest  0-2/10 at night aches enough to interrupt sleep,  standing / walking 0-1/10,  exercising / bending up to 7-8/10 Pain location: Rt knee Pain description: Stiff, achy, occasional  sharp Aggravating factors: Prolonged postures and too much WB Relieving factors: Pain meds, ice, change positions frequently  PRECAUTIONS: None  RED FLAGS: None   WEIGHT BEARING RESTRICTIONS: No  FALLS:  Has patient fallen in last 6 months? No  LIVING ENVIRONMENT: Lives with: lives with their family and grandson Lives in: House/apartment Stairs: Uses the rail with steps Has following equipment at home: Single point cane and Environmental consultant - 2 wheeled  OCCUPATION: Retired  PLOF: Independent  PATIENT GOALS: Return to gardening her acre lot, weed, plant in the garden  NEXT MD VISIT: 06/04/2024 at 8:30  AM  OBJECTIVE:  Note: Objective measures were completed at Evaluation unless otherwise noted.  DIAGNOSTIC FINDINGS: Right knee arthroplasty in expected alignment. No periprosthetic lucency or fracture. There has been patellar resurfacing. Recent postsurgical change includes air and edema in the soft tissues and joint space. Anterior skin staples in place.  PATIENT SURVEYS:  PSFS: THE PATIENT SPECIFIC FUNCTIONAL SCALE  Place score of 0-10 (0 = unable to perform activity and 10 = able to perform activity at the same level as before injury or problem)  Activity Date:  05/09/2024    Walk 2    2.  Garden 0    3.  Safely transfer to and from the ground 0    4.      Total Score 0.67      Total Score = Sum of activity scores/number of activities  Minimally Detectable Change: 3 points (for single activity); 2 points (for average score)  [name] (nd). The Patient Specific Functional Scale . Retrieved from [email or web address]   COGNITION: Overall cognitive status: Within functional limits for tasks assessed     SENSATION: WFL  EDEMA:  Noted and not objectively assessed  LOWER EXTREMITY ROM:   ROM Left/Right 05/09/2024 Rt 05/13/24 Right 05/20/24 Right 05/21/24 Right 05/26/24 Right 05/28/24  Hip flexion        Hip extension        Hip abduction        Hip adduction        Hip internal rotation        Hip external rotation        Knee flexion 145/52 Supine:  A:  P:  Sitting:  P: 70 Supine AA: 76* Seated P: 78* Standing  P: flexion stretch 82* Seated P: 87*  Knee extension 0/7     Seated P: -12*  LAQ  Ankle dorsiflexion        Ankle plantarflexion        Ankle inversion        Ankle eversion         (Blank rows = not tested)  LOWER EXTREMITY STRENGTH:  In pounds with hand-held dynamometer Left/Right 05/09/2024  Hip flexion   Hip extension   Hip abduction   Hip adduction   Hip internal rotation    Hip external rotation   Knee flexion   Knee extension 49.7/5.9 pounds  Ankle dorsiflexion   Ankle plantarflexion   Ankle inversion   Ankle eversion    (Blank rows = not tested)  GAIT: Distance walked: 50 feet Assistive device utilized: Environmental consultant - 2 wheeled Level of assistance: Modified independence Comments: Mayumi is using her walker full-time at this point  TREATMENT DATE:  05/30/2024 TherEx: UBE with bilat UE and LE seat 9 level 1 with partial revolutions for 4 minutes, then full fwd revolutions for 4 minutes with slight hip hike throughout  [employer] gastroc stretch  3x30s  LAQ with reciprocal motion of contralat limb 1x10 with PT overpressure into knee extension Glute bridge MWM 3x with 3 movements each, 10s stretch in max knee flexion ; able to reach 84deg  PT discussed HEP, walking program, and how to assist with extra soreness/pain/swelling  Manual:  Seated knee flexion with distraction provided by PT at knee with towel and using foot block to decrease compensatory motion during distraction ; 3x with 3 increases in increased knee flexion and 10s hold for distraction at each position   Vaso: Rt knee with towel under Rt ankle to promote knee extension, 6 minutes at medium compression at 34deg  Discontinued prior to 10 minutes as patient was experiencing increased pressure and soreness      TREATMENT DATE:  05/28/2024 Therapeutic Exercise: Sci Fit bike using BLEs & BUEs seat 9 level 1 partial revolutions for flexion stretch for 4 minutes, PT manual assisted full revolutions for 3 reps, then full revolutions backward for 2 mins, then forward 2 mins using BLEs & BUES, last 2 min with BLEs only.  Gastroc stretch on step heel depression 30 sec 2 reps /forefoot on 2 block 1 reps and heel raises BLEs 10 reps 2 sets bw stretches. Seated RLE LAQ & active knee flexion with LLE opposing motion for 10 reps.  Supine active knee flexion with thigh held vertical 5 sec hold 10 reps.   Seated hamstring stretch with strap 30 sec hold 3 reps. Supine gravity assisted knee flexion (thigh held vertical) 10 sec hold 10 reps    Therapeutic Activities: Sit to/from stand with PT demo & verbal cues on technique to engage RLE. Pt performed 5 reps.  Standing Terminal Knee Extension green theraband 5 sec hold 10 reps 2 sets (2nd set added SLS)  with cane support. PT working on knee extension for stance.   Neuromuscular Re-education Tandem stance 1st rep RLE in front & 2nd rep RLE in back; 1st set floor eyes open,  2nd set on floor eyes closed intermittent touch  Gait: Pt arrived ambulating with cane that she purchased.  PT adjusted height.    Manual: PROM Rt knee flexion and extension with overpressure and contract - relax technique Seated Rt knee flexion with IR and distraction  Vaso Rt knee with elevation towel roll ext stretch 10 min medium compression 34* While on Vaso PT updated HEP with HO, verbal & demo cues.  Pt verbalized understanding.      TREATMENT DATE:  05/26/2024 Therapeutic Exercise: Sci Fit bike using BLEs & BUEs seat 9 level 1 partial revolutions for flexion stretch for 6 minutes, PT manual assisted full revolutions for 3 reps, then full revolutions forward for 2 mins.  Attempted no UE support near end but too painful for quad.  Seated RLE LAQ & active knee flexion with LLE opposing motion for 15 reps.  Standing knee flexion stretch with RLE on chair against wall (BUE support on RW & 2nd chair) 10 sec hold for flexion stretch 3 reps 3 sets moving stance LE closer to chair every set to increase stretch.  Supine gravity assisted knee flexion (femur held vertical) 10 sec knee flexion 10 reps.   Therapeutic Activities: Standing Terminal Knee Extension green theraband 5 sec hold 15 reps with RW support. PT working on knee extension for stance.   Self-Care: PT demo & verbal cues on position in bed supine & side lying with pillows. Pt verbalized & return demo  understanding.   Gait Training: Pt amb 75'  and around clinic with cane safely.  PT recommended starting use in home where distances are limited.  Pt verbalized understanding.   Manual: PROM Rt knee flexion and extension with overpressure and contract - relax technique Seated Rt knee flexion with IR and distraction  Vaso Rt knee with elevation towel roll ext stretch 10 min medium compression 34*     PATIENT EDUCATION:  Education details: See above Person educated: Patient Education method: Explanation, Demonstration, Tactile cues, Verbal cues, and Handouts Education comprehension: verbalized understanding, returned demonstration, verbal cues required, tactile cues required, and needs further education  HOME EXERCISE PROGRAM: Access Code: [email or web address] URL: [email or web address] Date: 05/28/2024 Prepared by: Grayce Spatz  Exercises - Seated Knee Flexion AAROM  - 10 x daily - 7 x weekly - 1 sets - 10 reps - 10 seconds hold - Supine Quadricep Sets  - 10 x daily - 7 x weekly - 10 sets - 10 reps - 5 second hold - Seated Knee Flexion Extension AROM   - 2-4 x daily - 7 x weekly - 2-3 sets - 10 reps - 5 seconds hold - Seated Knee Flexion Extension AAROM with Overpressure  - 2-4 x daily - 7 x weekly - 2-3 sets - 10 reps - 5 seconds hold - Seated Long Arc Quad  - 2-4 x daily - 7 x weekly - 2-3 sets - 10 reps - 5 seconds hold - Supine Heel Slide with Strap  - 2-4 x daily - 7 x weekly - 2-3 sets - 10 reps - 5 seconds hold - Standing Knee Flexion Stretch on Step  - 2-3 x daily - 7 x weekly - 3-5 sets - 3 reps - 10 seconds hold - standing calf stretch with forefoot on small step or brick  - 1-2 x daily - 7 x weekly - 1 sets - 3 reps - 30 seconds hold - Standing Heel Raise  - 1-2 x daily - 7 x weekly - 2-3 sets - 10 reps - 2-3 seconds hold - Tandem Stance  - 1 x daily - 7 x weekly - 3 sets - 2 reps - 30 seconds hold - Seated Hamstring Stretch with Strap  - 1-2 x daily - 7 x weekly - 1  sets - 3 reps - 30 seconds hold  ASSESSMENT:  CLINICAL IMPRESSION: Patient arrived to session noting increased soreness secondary to increasing in home activity over last 24 hours. Patient was slightly limited with performance this date due to increased soreness. Patient will continue to benefit from skilled PT.   OBJECTIVE IMPAIRMENTS: Abnormal gait, decreased activity tolerance, decreased endurance, decreased knowledge of condition, difficulty walking, decreased ROM, decreased strength, increased edema, and pain.   ACTIVITY LIMITATIONS: bending, sitting, standing, squatting, sleeping, stairs, transfers, bed mobility, dressing, and locomotion level  PARTICIPATION LIMITATIONS: community activity and yard work  PERSONAL FACTORS: Lt breast cancer, kidney stones, HTN, Lt TSA 2019 are also affecting patient's functional outcome.   REHAB POTENTIAL: Good  CLINICAL DECISION MAKING: Stable/uncomplicated  EVALUATION COMPLEXITY: Low   GOALS: Goals reviewed with patient? Yes  SHORT TERM GOALS: Target date: 06/20/2024 Timberly will be independent with her day 1 home exercise program Baseline: Started 05/09/2024 Goal status:   Ongoing    05/26/2024  2.  Improve right knee active range of motion to 3 - 0 -95 degrees Baseline: 7 - 0 - 52 degrees Goal status: Ongoing    05/26/2024  3.  Improve right quadriceps strength to at least 30 pounds Baseline: 5.9 pounds  Goal status: Ongoing    05/26/2024   LONG TERM GOALS: Target date: 08/01/2024  Improve patient specific functional score to at least 5 Baseline: 0.67 Goal status: Ongoing    05/26/2024  2.  Ziyana will report right knee pain consistently 3/10 or better on the numeric pain rating scale Baseline: Can be as high as 8/10 Goal status: Ongoing    05/26/2024  3.  Improve right knee AROM to 2 - 0 -110 degrees Baseline: 7 - 0 - 52 degrees Goal status: Ongoing    05/26/2024  4.  Improve bilateral quadriceps strength to 60 pounds or  better Baseline: See objective Goal status: Ongoing   05/26/2024  5.  Meriam will be comfortable without the use of an assistive device within the house and with no more than a single-point cane outside the house at transfer into independent rehabilitation Baseline: 2 wheeled walker full-time Goal status: Ongoing   05/26/2024  6.  We will be independent with a long-term home maintenance exercise program at discharge Baseline: Started 05/09/2024 Goal status: Ongoing   05/26/2024   PLAN:  PT FREQUENCY: 2-3 times a week  PT DURATION: 12 weeks  PLANNED INTERVENTIONS: 97750- Physical Performance Testing, 97110-Therapeutic exercises, 97530- Therapeutic activity, 97112- Neuromuscular re-education, 97535- Self Care, [id number]- Manual therapy, [phone]- Gait training, [phone]- Electrical stimulation (unattended), [id number]- Vasopneumatic device, Patient/Family education, Balance training, Stair training, Joint mobilization, and Cryotherapy  PLAN FOR NEXT SESSION:  endurance focus (wants to be able to walk 3 miles by December), check updated HEP.   continue with therapeutic exercise and activities to progress range and functional strength, manual therapy, gait training with cane for ramp & curb, vaso for edema    Susannah Daring, PT, DPT 05/30/24 4:02 PM

## 2024-05-30 ENCOUNTER — Ambulatory Visit

## 2024-05-30 DIAGNOSIS — M6281 Muscle weakness (generalized): Secondary | ICD-10-CM | POA: Diagnosis not present

## 2024-05-30 DIAGNOSIS — R600 Localized edema: Secondary | ICD-10-CM

## 2024-05-30 DIAGNOSIS — R262 Difficulty in walking, not elsewhere classified: Secondary | ICD-10-CM | POA: Diagnosis not present

## 2024-05-30 DIAGNOSIS — M25661 Stiffness of right knee, not elsewhere classified: Secondary | ICD-10-CM | POA: Diagnosis not present

## 2024-05-30 DIAGNOSIS — M25561 Pain in right knee: Secondary | ICD-10-CM

## 2024-06-02 ENCOUNTER — Ambulatory Visit (INDEPENDENT_AMBULATORY_CARE_PROVIDER_SITE_OTHER)

## 2024-06-02 DIAGNOSIS — M6281 Muscle weakness (generalized): Secondary | ICD-10-CM

## 2024-06-02 DIAGNOSIS — M25561 Pain in right knee: Secondary | ICD-10-CM

## 2024-06-02 DIAGNOSIS — M25661 Stiffness of right knee, not elsewhere classified: Secondary | ICD-10-CM | POA: Diagnosis not present

## 2024-06-02 DIAGNOSIS — R600 Localized edema: Secondary | ICD-10-CM

## 2024-06-02 DIAGNOSIS — R262 Difficulty in walking, not elsewhere classified: Secondary | ICD-10-CM

## 2024-06-02 NOTE — Therapy (Signed)
 OUTPATIENT PHYSICAL THERAPY TREATMENT   Patient Name: Brooke Cantrell MRN: [id number] DOB:1946/06/22, 78 y.o., female Today's Date: 06/02/2024  END OF SESSION:  PT End of Session - 06/02/24 0932     Visit Number 9    Number of Visits 24    Date for Recertification  08/01/24    Authorization Type MEDICARE    Progress Note Due on Visit 10    PT Start Time 0932    PT Stop Time 1026    PT Time Calculation (min) 54 min    Activity Tolerance Patient tolerated treatment well;No increased pain;Patient limited by pain    Behavior During Therapy [facility] for tasks assessed/performed                  Past Medical History:  Diagnosis Date   Arthritis    Breast cancer (HCC)    Left breast   Depression    mild, no meds   History of kidney stones    Hypertension    Past Surgical History:  Procedure Laterality Date   breast cancer surgery     BREAST ENHANCEMENT SURGERY     BREAST LUMPECTOMY Left    CATARACT EXTRACTION, BILATERAL     ECTOPIC PREGNANCY SURGERY     EYE SURGERY     INNER EAR SURGERY     TONSILLECTOMY     TOTAL KNEE ARTHROPLASTY Right 04/22/2024   Procedure: ARTHROPLASTY, KNEE, TOTAL;  Surgeon: Vernetta Lonni GRADE, MD;  Location: [facility] OR;  Service: Orthopedics;  Laterality: Right;   TOTAL SHOULDER ARTHROPLASTY Left 04/25/2018   TOTAL SHOULDER ARTHROPLASTY Left 04/25/2018   Procedure: LEFT TOTAL SHOULDER REPLACEMENT;  Surgeon: Addie Cordella Hamilton, MD;  Location: [facility] OR;  Service: Orthopedics;  Laterality: Left;   TUBAL LIGATION     Patient Active Problem List   Diagnosis Date Noted   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   Weakness    Hyperlipidemia    Acute respiratory failure due to COVID-19 (HCC) 06/10/2020   Pneumonia due to COVID-19 virus 06/10/2020   Hx of multiple pulmonary nodules 06/10/2020   Acute respiratory failure with hypoxia (HCC)    Lung nodule seen on imaging study 01/08/2020   History of breast cancer 04/14/2019   Sepsis  (HCC) 04/02/2019   CAP (community acquired pneumonia) 04/02/2019   Elevated troponin 04/02/2019   Shoulder arthritis 04/25/2018   Primary osteoarthritis, 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   Chronic left shoulder pain 05/17/2017   Cervical disc disorder with radiculopathy 08/14/2016   Chronic infection of sinus 08/31/2015   Cough 08/31/2015   Allergic reaction 08/31/2015    PCP: Alda Carpen, MD  REFERRING PROVIDER: Lonni GRADE Vernetta, MD  REFERRING DIAG: [phone] (ICD-10-CM) - Status post total right knee replacement  THERAPY DIAG:  Acute pain of right knee  Stiffness of right knee, not elsewhere classified  Localized edema  Muscle weakness (generalized)  Difficulty in walking, not elsewhere classified  Rationale for Evaluation and Treatment: Rehabilitation  ONSET DATE: 04/22/2024  SUBJECTIVE:   SUBJECTIVE STATEMENT: Patient arriving to session noting improved symptoms compared to previous session with slight soreness/stiffness with ROM.   PERTINENT HISTORY: Lt breast cancer, kidney stones, HTN, Lt TSA 2019  PAIN:  Are you having pain? Yes: NPRS scale:  in last week  at rest  0-2/10 at night aches enough to interrupt sleep,  standing / walking 0-1/10,  exercising / bending up to 7-8/10 Pain location: Rt knee Pain description: Stiff, achy, occasional sharp Aggravating factors: Prolonged  postures and too much WB Relieving factors: Pain meds, ice, change positions frequently  PRECAUTIONS: None  RED FLAGS: None   WEIGHT BEARING RESTRICTIONS: No  FALLS:  Has patient fallen in last 6 months? No  LIVING ENVIRONMENT: Lives with: lives with their family and grandson Lives in: House/apartment Stairs: Uses the rail with steps Has following equipment at home: Single point cane and Environmental consultant - 2 wheeled  OCCUPATION: Retired  PLOF: Independent  PATIENT GOALS: Return to gardening her acre lot, weed, plant in the garden  NEXT MD VISIT: 06/04/2024 at 8:30  AM  OBJECTIVE:  Note: Objective measures were completed at Evaluation unless otherwise noted.  DIAGNOSTIC FINDINGS: Right knee arthroplasty in expected alignment. No periprosthetic lucency or fracture. There has been patellar resurfacing. Recent postsurgical change includes air and edema in the soft tissues and joint space. Anterior skin staples in place.  PATIENT SURVEYS:  PSFS: THE PATIENT SPECIFIC FUNCTIONAL SCALE  Place score of 0-10 (0 = unable to perform activity and 10 = able to perform activity at the same level as before injury or problem)  Activity Date:  05/09/2024 06/02/2024   Walk 2 5   2.  Garden 0 0   3.  Safely transfer to and from the ground 0 0   4.      Total Score 0.67 1.67     Total Score = Sum of activity scores/number of activities  Minimally Detectable Change: 3 points (for single activity); 2 points (for average score)  [name] [facility] (nd). The Patient Specific Functional Scale . Retrieved from [email or web address]   COGNITION: Overall cognitive status: Within functional limits for tasks assessed     SENSATION: WFL  EDEMA:  Noted and not objectively assessed  LOWER EXTREMITY ROM:   ROM Left/Right 05/09/2024 Rt 05/13/24 Right 05/20/24 Right 05/21/24 Right 05/26/24 Right 05/28/24 Rt 06/02/2024  Hip flexion         Hip extension         Hip abduction         Hip adduction         Hip internal rotation         Hip external rotation         Knee flexion 145/52 Supine:  A:  P:  Sitting:  P: 70 Supine AA: 76* Seated P: 78* Standing  P: flexion stretch 82* Seated P: 87* Supine  A: 76deg  Knee extension 0/7     Seated P: -12*  LAQ Supine:  A: 6deg of flexion  Ankle dorsiflexion         Ankle plantarflexion         Ankle inversion         Ankle eversion          (Blank rows = not tested)  LOWER EXTREMITY STRENGTH:  In pounds with hand-held dynamometer Left/Right 05/09/2024  Hip  flexion   Hip extension   Hip abduction   Hip adduction   Hip internal rotation   Hip external rotation   Knee flexion   Knee extension 49.7/5.9 pounds  Ankle dorsiflexion   Ankle plantarflexion   Ankle inversion   Ankle eversion    (Blank rows = not tested)  GAIT: Distance walked: 50 feet Assistive device utilized: Walker - 2 wheeled Level of assistance: Modified independence Comments: Serenity is using her walker full-time at this point   TREATMENT DATE:  05/28/2024 UBE with bilat UE and LE seat 9 level 1 with partial revolutions for 3  minutes, then switching between full revolutions (switching between fwd and back) and rocking for 5 minutes  Slant board gastroc stretch 3x30s  Bilat leg press with focus on knee flexion ROM 62# 2x12 with 2-3s hold Single leg leg press 2x12 with 37# for strengthening  PT discussed knee flexion exercises to perform at home  Manual:  Seated IR/distraction with active knee flexion and slight overpressure provided from Pt and PT performing knee extension PROM, 1x8 with 10s hold in each direction   Vaso:  Rt knee with towel under Rt ankle to promote knee extension, 6 minutes at medium compression at 34deg     TREATMENT DATE:  05/30/2024 TherEx: UBE with bilat UE and LE seat 9 level 1 with partial revolutions for 4 minutes, then full fwd revolutions for 4 minutes with slight hip hike throughout  [employer] gastroc stretch 3x30s  LAQ with reciprocal motion of contralat limb 1x10 with PT overpressure into knee extension Glute bridge MWM 3x with 3 movements each, 10s stretch in max knee flexion ; able to reach 84deg  PT discussed HEP, walking program, and how to assist with extra soreness/pain/swelling  Manual:  Seated knee flexion with distraction provided by PT at knee with towel and using foot block to decrease compensatory motion during distraction ; 3x with 3 increases in increased knee flexion and 10s hold for distraction at each position    Vaso: Rt knee with towel under Rt ankle to promote knee extension, 6 minutes at medium compression at 34deg  Discontinued prior to 10 minutes as patient was experiencing increased pressure and soreness    TREATMENT DATE:  05/28/2024 Therapeutic Exercise: Sci Fit bike using BLEs & BUEs seat 9 level 1 partial revolutions for flexion stretch for 4 minutes, PT manual assisted full revolutions for 3 reps, then full revolutions backward for 2 mins, then forward 2 mins using BLEs & BUES, last 2 min with BLEs only.  Gastroc stretch on step heel depression 30 sec 2 reps /forefoot on 2 block 1 reps and heel raises BLEs 10 reps 2 sets bw stretches. Seated RLE LAQ & active knee flexion with LLE opposing motion for 10 reps.  Supine active knee flexion with thigh held vertical 5 sec hold 10 reps.  Seated hamstring stretch with strap 30 sec hold 3 reps. Supine gravity assisted knee flexion (thigh held vertical) 10 sec hold 10 reps    Therapeutic Activities: Sit to/from stand with PT demo & verbal cues on technique to engage RLE. Pt performed 5 reps.  Standing Terminal Knee Extension green theraband 5 sec hold 10 reps 2 sets (2nd set added SLS)  with cane support. PT working on knee extension for stance.   Neuromuscular Re-education Tandem stance 1st rep RLE in front & 2nd rep RLE in back; 1st set floor eyes open,  2nd set on floor eyes closed intermittent touch  Gait: Pt arrived ambulating with cane that she purchased.  PT adjusted height.    Manual: PROM Rt knee flexion and extension with overpressure and contract - relax technique Seated Rt knee flexion with IR and distraction  Vaso Rt knee with elevation towel roll ext stretch 10 min medium compression 34* While on Vaso PT updated HEP with HO, verbal & demo cues.  Pt verbalized understanding.      TREATMENT DATE:  05/26/2024 Therapeutic Exercise: Sci Fit bike using BLEs & BUEs seat 9 level 1 partial revolutions for flexion stretch for 6  minutes, PT manual assisted full revolutions for  3 reps, then full revolutions forward for 2 mins.  Attempted no UE support near end but too painful for quad.  Seated RLE LAQ & active knee flexion with LLE opposing motion for 15 reps.  Standing knee flexion stretch with RLE on chair against wall (BUE support on RW & 2nd chair) 10 sec hold for flexion stretch 3 reps 3 sets moving stance LE closer to chair every set to increase stretch.  Supine gravity assisted knee flexion (femur held vertical) 10 sec knee flexion 10 reps.   Therapeutic Activities: Standing Terminal Knee Extension green theraband 5 sec hold 15 reps with RW support. PT working on knee extension for stance.   Self-Care: PT demo & verbal cues on position in bed supine & side lying with pillows. Pt verbalized & return demo understanding.   Gait Training: Pt amb 75' and around clinic with cane safely.  PT recommended starting use in home where distances are limited.  Pt verbalized understanding.   Manual: PROM Rt knee flexion and extension with overpressure and contract - relax technique Seated Rt knee flexion with IR and distraction  Vaso Rt knee with elevation towel roll ext stretch 10 min medium compression 34*     PATIENT EDUCATION:  Education details: See above Person educated: Patient Education method: Explanation, Demonstration, Tactile cues, Verbal cues, and Handouts Education comprehension: verbalized understanding, returned demonstration, verbal cues required, tactile cues required, and needs further education  HOME EXERCISE PROGRAM: Access Code: [email or web address] URL: [email or web address] Date: 05/28/2024 Prepared by: Grayce Spatz  Exercises - Seated Knee Flexion AAROM  - 10 x daily - 7 x weekly - 1 sets - 10 reps - 10 seconds hold - Supine Quadricep Sets  - 10 x daily - 7 x weekly - 10 sets - 10 reps - 5 second hold - Seated Knee Flexion Extension AROM   - 2-4 x daily - 7 x weekly - 2-3 sets - 10  reps - 5 seconds hold - Seated Knee Flexion Extension AAROM with Overpressure  - 2-4 x daily - 7 x weekly - 2-3 sets - 10 reps - 5 seconds hold - Seated Long Arc Quad  - 2-4 x daily - 7 x weekly - 2-3 sets - 10 reps - 5 seconds hold - Supine Heel Slide with Strap  - 2-4 x daily - 7 x weekly - 2-3 sets - 10 reps - 5 seconds hold - Standing Knee Flexion Stretch on Step  - 2-3 x daily - 7 x weekly - 3-5 sets - 3 reps - 10 seconds hold - standing calf stretch with forefoot on small step or brick  - 1-2 x daily - 7 x weekly - 1 sets - 3 reps - 30 seconds hold - Standing Heel Raise  - 1-2 x daily - 7 x weekly - 2-3 sets - 10 reps - 2-3 seconds hold - Tandem Stance  - 1 x daily - 7 x weekly - 3 sets - 2 reps - 30 seconds hold - Seated Hamstring Stretch with Strap  - 1-2 x daily - 7 x weekly - 1 sets - 3 reps - 30 seconds hold  ASSESSMENT:  CLINICAL IMPRESSION: Patient arrived to session noting improved symptoms compared to last session, though continuing to struggle with soreness and stiffness including the hip due to compensations. Patient tolerated most activities with minimal increase in soreness, however, deficits in knee flexion and extension ROM persist. Patient will continue to benefit from skilled PT.   OBJECTIVE IMPAIRMENTS: Abnormal  gait, decreased activity tolerance, decreased endurance, decreased knowledge of condition, difficulty walking, decreased ROM, decreased strength, increased edema, and pain.   ACTIVITY LIMITATIONS: bending, sitting, standing, squatting, sleeping, stairs, transfers, bed mobility, dressing, and locomotion level  PARTICIPATION LIMITATIONS: community activity and yard work  PERSONAL FACTORS: Lt breast cancer, kidney stones, HTN, Lt TSA 2019 are also affecting patient's functional outcome.   REHAB POTENTIAL: Good  CLINICAL DECISION MAKING: Stable/uncomplicated  EVALUATION COMPLEXITY: Low   GOALS: Goals reviewed with patient? Yes  SHORT TERM GOALS: Target  date: 06/20/2024 Ahmiyah will be independent with her day 1 home exercise program Baseline: Started 05/09/2024 Goal status:   Ongoing    05/26/2024  2.  Improve right knee active range of motion to 3 - 0 -95 degrees Baseline: 7 - 0 - 52 degrees Goal status: Ongoing    05/26/2024  3.  Improve right quadriceps strength to at least 30 pounds Baseline: 5.9 pounds Goal status: Ongoing    05/26/2024   LONG TERM GOALS: Target date: 08/01/2024  Improve patient specific functional score to at least 5 Baseline: 0.67 Goal status: Ongoing    05/26/2024  2.  Shatina will report right knee pain consistently 3/10 or better on the numeric pain rating scale Baseline: Can be as high as 8/10 Goal status: Ongoing    05/26/2024  3.  Improve right knee AROM to 2 - 0 -110 degrees Baseline: 7 - 0 - 52 degrees Goal status: Ongoing    05/26/2024  4.  Improve bilateral quadriceps strength to 60 pounds or better Baseline: See objective Goal status: Ongoing   05/26/2024  5.  Saveah will be comfortable without the use of an assistive device within the house and with no more than a single-point cane outside the house at transfer into independent rehabilitation Baseline: 2 wheeled walker full-time Goal status: Ongoing   05/26/2024  6.  We will be independent with a long-term home maintenance exercise program at discharge Baseline: Started 05/09/2024 Goal status: Ongoing   05/26/2024   PLAN:  PT FREQUENCY: 2-3 times a week  PT DURATION: 12 weeks  PLANNED INTERVENTIONS: 97750- Physical Performance Testing, 97110-Therapeutic exercises, 97530- Therapeutic activity, 97112- Neuromuscular re-education, 97535- Self Care, 02859- Manual therapy, [phone]- Gait training, [phone]- Electrical stimulation (unattended), [id number]- Vasopneumatic device, Patient/Family education, Balance training, Stair training, Joint mobilization, and Cryotherapy  PLAN FOR NEXT SESSION:   endurance focus (wants to be able to walk 3 miles by December), check  updated HEP.   continue with therapeutic exercise and activities to progress range and functional strength, manual therapy, gait training with cane for ramp & curb, vaso for edema    Susannah Daring, PT, DPT 06/02/24 10:38 AM

## 2024-06-04 ENCOUNTER — Ambulatory Visit (INDEPENDENT_AMBULATORY_CARE_PROVIDER_SITE_OTHER): Admitting: Orthopaedic Surgery

## 2024-06-04 ENCOUNTER — Ambulatory Visit (INDEPENDENT_AMBULATORY_CARE_PROVIDER_SITE_OTHER): Admitting: Physical Therapy

## 2024-06-04 ENCOUNTER — Encounter: Payer: Self-pay | Admitting: Physical Therapy

## 2024-06-04 DIAGNOSIS — Z96651 Presence of right artificial knee joint: Secondary | ICD-10-CM

## 2024-06-04 DIAGNOSIS — R262 Difficulty in walking, not elsewhere classified: Secondary | ICD-10-CM

## 2024-06-04 DIAGNOSIS — M6281 Muscle weakness (generalized): Secondary | ICD-10-CM | POA: Diagnosis not present

## 2024-06-04 DIAGNOSIS — M25561 Pain in right knee: Secondary | ICD-10-CM

## 2024-06-04 DIAGNOSIS — M25661 Stiffness of right knee, not elsewhere classified: Secondary | ICD-10-CM

## 2024-06-04 DIAGNOSIS — R600 Localized edema: Secondary | ICD-10-CM

## 2024-06-04 NOTE — Progress Notes (Signed)
 The patient is a 78 year old female who is now 6 weeks status post a right total knee replacement to treat significant right knee pain and arthritis.  She has been actively participating in outpatient physical therapy to work on range of motion of her right knee.  She has gotten out the yard and doing yard work.  I did review the notes in physical therapy and she passively is only gotten to about 87 degrees flexion.  She does have a therapy visit this morning after this visit here in our office.  She also lacks full extension by few degrees.  On my exam, she does lack full extension by few degrees on her right knee and flexion was to almost 90 degrees.  I did recommend placing a steroid injection in her right knee today to see if this will help with some of the pain and can hopefully get her knee moving better.  With that being said, we will see her back in just 1 week and if her flexion has not improved significantly, we would recommend a manipulation under anesthesia.  They understand that as well.  Will see her back in a week.  No x-rays are needed.  She will resume therapy today.

## 2024-06-04 NOTE — Therapy (Signed)
 OUTPATIENT PHYSICAL THERAPY TREATMENT/PROGRESS NOTE   Progress Note Reporting Period 05/09/24 to 06/04/24  See note below for Objective Data and Assessment of Progress/Goals.       Patient Name: Brooke Cantrell MRN: [id number] DOB:1946/09/02, 78 y.o., female Today's Date: 06/04/2024  END OF SESSION:  PT End of Session - 06/04/24 0902     Visit Number 10    Number of Visits 24    Date for Recertification  08/01/24    Authorization Type MEDICARE    Progress Note Due on Visit 20    PT Start Time 0905    PT Stop Time 0959    PT Time Calculation (min) 54 min    Activity Tolerance Patient tolerated treatment well;No increased pain;Patient limited by pain    Behavior During Therapy [facility] for tasks assessed/performed                   Past Medical History:  Diagnosis Date   Arthritis    Breast cancer (HCC)    Left breast   Depression    mild, no meds   History of kidney stones    Hypertension    Past Surgical History:  Procedure Laterality Date   breast cancer surgery     BREAST ENHANCEMENT SURGERY     BREAST LUMPECTOMY Left    CATARACT EXTRACTION, BILATERAL     ECTOPIC PREGNANCY SURGERY     EYE SURGERY     INNER EAR SURGERY     TONSILLECTOMY     TOTAL KNEE ARTHROPLASTY Right 04/22/2024   Procedure: ARTHROPLASTY, KNEE, TOTAL;  Surgeon: Vernetta Lonni GRADE, MD;  Location: [facility] OR;  Service: Orthopedics;  Laterality: Right;   TOTAL SHOULDER ARTHROPLASTY Left 04/25/2018   TOTAL SHOULDER ARTHROPLASTY Left 04/25/2018   Procedure: LEFT TOTAL SHOULDER REPLACEMENT;  Surgeon: Addie Cordella Hamilton, MD;  Location: [facility] OR;  Service: Orthopedics;  Laterality: Left;   TUBAL LIGATION     Patient Active Problem List   Diagnosis Date Noted   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   Weakness    Hyperlipidemia    Acute respiratory failure due to COVID-19 (HCC) 06/10/2020   Pneumonia due to COVID-19 virus 06/10/2020   Hx of multiple pulmonary nodules 06/10/2020    Acute respiratory failure with hypoxia (HCC)    Lung nodule seen on imaging study 01/08/2020   History of breast cancer 04/14/2019   Sepsis (HCC) 04/02/2019   CAP (community acquired pneumonia) 04/02/2019   Elevated troponin 04/02/2019   Shoulder arthritis 04/25/2018   Primary osteoarthritis, 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   Chronic left shoulder pain 05/17/2017   Cervical disc disorder with radiculopathy 08/14/2016   Chronic infection of sinus 08/31/2015   Cough 08/31/2015   Allergic reaction 08/31/2015    PCP: Alda Carpen, MD  REFERRING PROVIDER: Lonni GRADE Vernetta, MD  REFERRING DIAG: [phone] (ICD-10-CM) - Status post total right knee replacement  THERAPY DIAG:  Acute pain of right knee  Stiffness of right knee, not elsewhere classified  Localized edema  Muscle weakness (generalized)  Difficulty in walking, not elsewhere classified  Rationale for Evaluation and Treatment: Rehabilitation  ONSET DATE: 04/22/2024  SUBJECTIVE:   SUBJECTIVE STATEMENT: Had cortisone injection this morning; if ROM not improved in a week will consider manipulation   PERTINENT HISTORY: Lt breast cancer, kidney stones, HTN, Lt TSA 2019  PAIN:  Are you having pain? Yes: NPRS scale:  in last week  at rest  0-2/10 at night aches enough to interrupt sleep,  standing / walking 0-1/10,  exercising / bending up to 7-8/10 Pain location: Rt knee Pain description: Stiff, achy, occasional sharp Aggravating factors: Prolonged postures and too much WB Relieving factors: Pain meds, ice, change positions frequently  PRECAUTIONS: None  RED FLAGS: None   WEIGHT BEARING RESTRICTIONS: No  FALLS:  Has patient fallen in last 6 months? No  LIVING ENVIRONMENT: Lives with: lives with their family and grandson Lives in: House/apartment Stairs: Uses the rail with steps Has following equipment at home: Single point cane and Environmental consultant - 2 wheeled  OCCUPATION: Retired  PLOF:  Independent  PATIENT GOALS: Return to gardening her acre lot, weed, plant in the garden    OBJECTIVE:  Note: Objective measures were completed at Evaluation unless otherwise noted.  DIAGNOSTIC FINDINGS: Right knee arthroplasty in expected alignment. No periprosthetic lucency or fracture. There has been patellar resurfacing. Recent postsurgical change includes air and edema in the soft tissues and joint space. Anterior skin staples in place.  PATIENT SURVEYS:  PSFS: THE PATIENT SPECIFIC FUNCTIONAL SCALE  Place score of 0-10 (0 = unable to perform activity and 10 = able to perform activity at the same level as before injury or problem)  Activity Date:  05/09/2024 06/02/2024   Walk 2 5   2.  Garden 0 0   3.  Safely transfer to and from the ground 0 0   4.      Total Score 0.67 1.67     Total Score = Sum of activity scores/number of activities  Minimally Detectable Change: 3 points (for single activity); 2 points (for average score)  [facility] (nd). The Patient Specific Functional Scale . Retrieved from [email or web address]   COGNITION: Overall cognitive status: Within functional limits for tasks assessed     SENSATION: WFL  EDEMA:  Noted and not objectively assessed  LOWER EXTREMITY ROM:   ROM Left/Right 05/09/2024 Rt 05/13/24 Right 05/20/24 Right 05/21/24 Right 05/26/24 Right 05/28/24 Rt 06/02/2024 Right 06/04/24  Knee flexion 145/52 Supine:  A:  P:  Sitting:  P: 70 Supine AA: 76* Seated P: 78* Standing  P: flexion stretch 82* Seated P: 87* Supine  A: 76deg A: 89  P: 95  Knee extension 0/7     Seated P: -12*  LAQ Supine:  A: 6deg of flexion A: -11 (seated LAQ)   (Blank rows = not tested)  LOWER EXTREMITY STRENGTH:  In pounds with hand-held dynamometer Left/Right 05/09/2024 Right 06/04/24  Knee flexion    Knee extension 49.7/5.9 pounds 27.8#   (Blank rows = not tested)  GAIT: Distance  walked: 50 feet Assistive device utilized: Environmental consultant - 2 wheeled Level of assistance: Modified independence Comments: Brooke Cantrell is using her walker full-time at this point   TREATMENT DATE:  06/04/24 TherAct UBE L1 seat 9, 8 x 10 min; partial to full revolutions Seated AA Rt knee flexion 10 x 10 sec hold with LLE providing overpressure Seated Rt LAQ x 10; 5 sec hold AA heel slides 2x10 with ball and strap on Rt Supine in 90 deg hip flexion knee flexion and ext holds with 3# weight x 10 sec each position x 10 reps  TherEx ROM and MMT - see above for details Reviewed HEP and encouraged working on flexion every hour that she's awake; and knee is extended when resting to maximize motion  Modalities Vaso x 10 min; mod pressure to Rt knee in elevation; 34 deg    05/28/2024 UBE with bilat UE and LE seat 9 level  1 with partial revolutions for 3 minutes, then switching between full revolutions (switching between fwd and back) and rocking for 5 minutes  Slant board gastroc stretch 3x30s  Bilat leg press with focus on knee flexion ROM 62# 2x12 with 2-3s hold Single leg leg press 2x12 with 37# for strengthening  PT discussed knee flexion exercises to perform at home  Manual:  Seated IR/distraction with active knee flexion and slight overpressure provided from Pt and PT performing knee extension PROM, 1x8 with 10s hold in each direction   Vaso:  Rt knee with towel under Rt ankle to promote knee extension, 6 minutes at medium compression at 34deg     05/30/2024 TherEx: UBE with bilat UE and LE seat 9 level 1 with partial revolutions for 4 minutes, then full fwd revolutions for 4 minutes with slight hip hike throughout  [employer] gastroc stretch 3x30s  LAQ with reciprocal motion of contralat limb 1x10 with PT overpressure into knee extension Glute bridge MWM 3x with 3 movements each, 10s stretch in max knee flexion ; able to reach 84deg  PT discussed HEP, walking program, and how to assist with  extra soreness/pain/swelling  Manual:  Seated knee flexion with distraction provided by PT at knee with towel and using foot block to decrease compensatory motion during distraction ; 3x with 3 increases in increased knee flexion and 10s hold for distraction at each position   Vaso: Rt knee with towel under Rt ankle to promote knee extension, 6 minutes at medium compression at 34deg  Discontinued prior to 10 minutes as patient was experiencing increased pressure and soreness     PATIENT EDUCATION:  Education details: See above Person educated: Patient Education method: Explanation, Demonstration, Tactile cues, Verbal cues, and Handouts Education comprehension: verbalized understanding, returned demonstration, verbal cues required, tactile cues required, and needs further education  HOME EXERCISE PROGRAM: Access Code: [email or web address] URL: [email or web address] Date: 05/28/2024 Prepared by: Grayce Spatz  Exercises - Seated Knee Flexion AAROM  - 10 x daily - 7 x weekly - 1 sets - 10 reps - 10 seconds hold - Supine Quadricep Sets  - 10 x daily - 7 x weekly - 10 sets - 10 reps - 5 second hold - Seated Knee Flexion Extension AROM   - 2-4 x daily - 7 x weekly - 2-3 sets - 10 reps - 5 seconds hold - Seated Knee Flexion Extension AAROM with Overpressure  - 2-4 x daily - 7 x weekly - 2-3 sets - 10 reps - 5 seconds hold - Seated Long Arc Quad  - 2-4 x daily - 7 x weekly - 2-3 sets - 10 reps - 5 seconds hold - Supine Heel Slide with Strap  - 2-4 x daily - 7 x weekly - 2-3 sets - 10 reps - 5 seconds hold - Standing Knee Flexion Stretch on Step  - 2-3 x daily - 7 x weekly - 3-5 sets - 3 reps - 10 seconds hold - standing calf stretch with forefoot on small step or brick  - 1-2 x daily - 7 x weekly - 1 sets - 3 reps - 30 seconds hold - Standing Heel Raise  - 1-2 x daily - 7 x weekly - 2-3 sets - 10 reps - 2-3 seconds hold - Tandem Stance  - 1 x daily - 7 x weekly - 3 sets - 2 reps - 30  seconds hold - Seated Hamstring Stretch with Strap  - 1-2 x daily - 7 x weekly -  1 sets - 3 reps - 30 seconds hold  ASSESSMENT:  CLINICAL IMPRESSION: Pt tolerated session well today with improvement in flexion noted today.  She seems more limited by pain and guarding for flexion at this time.  All LTGs are ongoing at this time with progress made towards goals.  Will continue to benefit from PT to maximize function.   OBJECTIVE IMPAIRMENTS: Abnormal gait, decreased activity tolerance, decreased endurance, decreased knowledge of condition, difficulty walking, decreased ROM, decreased strength, increased edema, and pain.   ACTIVITY LIMITATIONS: bending, sitting, standing, squatting, sleeping, stairs, transfers, bed mobility, dressing, and locomotion level  PARTICIPATION LIMITATIONS: community activity and yard work  PERSONAL FACTORS: Lt breast cancer, kidney stones, HTN, Lt TSA 2019 are also affecting patient's functional outcome.   REHAB POTENTIAL: Good  CLINICAL DECISION MAKING: Stable/uncomplicated  EVALUATION COMPLEXITY: Low   GOALS: Goals reviewed with patient? Yes  SHORT TERM GOALS: Target date: 06/20/2024  Brooke Cantrell will be independent with her day 1 home exercise program Baseline: Started 05/09/2024 Goal status:   ONGOING 06/04/24  2.  Improve right knee active range of motion to 3 - 0 -95 degrees Baseline: 7 - 0 - 52 degrees Goal status: ONGOING 06/04/24  3.  Improve right quadriceps strength to at least 30 pounds Baseline: 5.9 pounds Goal status: ONGOING 06/04/24   LONG TERM GOALS: Target date: 08/01/2024  Improve patient specific functional score to at least 5 Baseline: 0.67 Goal status: Ongoing 06/04/24  2.  Brooke Cantrell will report right knee pain consistently 3/10 or better on the numeric pain rating scale Baseline: Can be as high as 8/10 Goal status: ONGOING 06/04/24  3.  Improve right knee AROM to 2 - 0 -110 degrees Baseline: 7 - 0 - 52 degrees Goal status: ONGOING  06/04/24  4.  Improve bilateral quadriceps strength to 60 pounds or better Baseline: See objective Goal status: Ongoing   05/26/2024  5.  Brooke Cantrell will be comfortable without the use of an assistive device within the house and with no more than a single-point cane outside the house at transfer into independent rehabilitation Baseline: 2 wheeled walker full-time Goal status:  ONGOING 06/04/24  6.  We will be independent with a long-term home maintenance exercise program at discharge Baseline: Started 05/09/2024 Goal status: ONGOING 06/04/24   PLAN:  PT FREQUENCY: 2-3 times a week  PT DURATION: 12 weeks  PLANNED INTERVENTIONS: 97750- Physical Performance Testing, 97110-Therapeutic exercises, 97530- Therapeutic activity, 97112- Neuromuscular re-education, 97535- Self Care, [id number]- Manual therapy, [phone]- Gait training, [phone]- Electrical stimulation (unattended), [id number]- Vasopneumatic device, Patient/Family education, Balance training, Stair training, Joint mobilization, and Cryotherapy  PLAN FOR NEXT SESSION: continue with ROM focus,  endurance focus (wants to be able to walk 3 miles by December), check updated HEP.   continue with therapeutic exercise and activities to progress range and functional strength, manual therapy, gait training with cane for ramp & curb, vaso for edema  NEXT MD VISIT: not scheduled as of 06/04/24  Corean JULIANNA Ku, PT, DPT 06/04/24 9:59 AM

## 2024-06-06 ENCOUNTER — Encounter: Payer: Self-pay | Admitting: Rehabilitative and Restorative Service Providers"

## 2024-06-06 ENCOUNTER — Ambulatory Visit (INDEPENDENT_AMBULATORY_CARE_PROVIDER_SITE_OTHER): Admitting: Rehabilitative and Restorative Service Providers"

## 2024-06-06 DIAGNOSIS — M25561 Pain in right knee: Secondary | ICD-10-CM

## 2024-06-06 DIAGNOSIS — M25661 Stiffness of right knee, not elsewhere classified: Secondary | ICD-10-CM | POA: Diagnosis not present

## 2024-06-06 DIAGNOSIS — M6281 Muscle weakness (generalized): Secondary | ICD-10-CM

## 2024-06-06 DIAGNOSIS — R262 Difficulty in walking, not elsewhere classified: Secondary | ICD-10-CM

## 2024-06-06 DIAGNOSIS — R600 Localized edema: Secondary | ICD-10-CM | POA: Diagnosis not present

## 2024-06-06 NOTE — Therapy (Signed)
 OUTPATIENT PHYSICAL THERAPY TREATMENT NOTE   Patient Name: Brooke Cantrell MRN: [id number] DOB:[birth year], 78 y.o., female Today's Date: 06/06/2024  END OF SESSION:  PT End of Session - 06/06/24 0928     Visit Number 11    Number of Visits 24    Date for Recertification  08/01/24    Authorization Type MEDICARE    Progress Note Due on Visit 20    PT Start Time 0927    PT Stop Time 1015    PT Time Calculation (min) 48 min    Activity Tolerance Patient tolerated treatment well;No increased pain;Patient limited by pain    Behavior During Therapy [facility] for tasks assessed/performed           Past Medical History:  Diagnosis Date   Arthritis    Breast cancer (HCC)    Left breast   Depression    mild, no meds   History of kidney stones    Hypertension    Past Surgical History:  Procedure Laterality Date   breast cancer surgery     BREAST ENHANCEMENT SURGERY     BREAST LUMPECTOMY Left    CATARACT EXTRACTION, BILATERAL     ECTOPIC PREGNANCY SURGERY     EYE SURGERY     INNER EAR SURGERY     TONSILLECTOMY     TOTAL KNEE ARTHROPLASTY Right 04/22/2024   Procedure: ARTHROPLASTY, KNEE, TOTAL;  Surgeon: Vernetta Lonni GRADE, MD;  Location: [facility] OR;  Service: Orthopedics;  Laterality: Right;   TOTAL SHOULDER ARTHROPLASTY Left 04/25/2018   TOTAL SHOULDER ARTHROPLASTY Left 04/25/2018   Procedure: LEFT TOTAL SHOULDER REPLACEMENT;  Surgeon: Addie Cordella Hamilton, MD;  Location: [facility] OR;  Service: Orthopedics;  Laterality: Left;   TUBAL LIGATION     Patient Active Problem List   Diagnosis Date Noted   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   Weakness    Hyperlipidemia    Acute respiratory failure due to COVID-19 (HCC) 06/10/2020   Pneumonia due to COVID-19 virus 06/10/2020   Hx of multiple pulmonary nodules 06/10/2020   Acute respiratory failure with hypoxia (HCC)    Lung nodule seen on imaging study 01/08/2020   History of breast cancer 04/14/2019   Sepsis (HCC)  04/02/2019   CAP (community acquired pneumonia) 04/02/2019   Elevated troponin 04/02/2019   Shoulder arthritis 04/25/2018   Primary osteoarthritis, 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   Chronic left shoulder pain 05/17/2017   Cervical disc disorder with radiculopathy 08/14/2016   Chronic infection of sinus 08/31/2015   Cough 08/31/2015   Allergic reaction 08/31/2015    PCP: Alda Carpen, MD  REFERRING PROVIDER: Lonni GRADE Vernetta, MD  REFERRING DIAG: [phone] (ICD-10-CM) - Status post total right knee replacement  THERAPY DIAG:  Acute pain of right knee  Stiffness of right knee, not elsewhere classified  Localized edema  Muscle weakness (generalized)  Difficulty in walking, not elsewhere classified  Rationale for Evaluation and Treatment: Rehabilitation  ONSET DATE: 04/22/2024  SUBJECTIVE:   SUBJECTIVE STATEMENT: Kynslie gave great effort with her rehabilitation today.  She is highly motivated to avoid a manipulation.  PERTINENT HISTORY: Lt breast cancer, kidney stones, HTN, Lt TSA 2019  PAIN:  Are you having pain? Yes: NPRS scale: 0-8/10 this week Pain location: Rt knee Pain description: Stiff, achy, occasional sharp Aggravating factors: Prolonged postures and too much WB Relieving factors: Pain meds, ice, change positions frequently  PRECAUTIONS: None  RED FLAGS: None   WEIGHT BEARING RESTRICTIONS: No  FALLS:  Has patient  fallen in last 6 months? No  LIVING ENVIRONMENT: Lives with: lives with their family and grandson Lives in: House/apartment Stairs: Uses the rail with steps Has following equipment at home: Single point cane and Environmental consultant - 2 wheeled  OCCUPATION: Retired  PLOF: Independent  PATIENT GOALS: Return to gardening her acre lot, weed, plant in the garden    OBJECTIVE:  Note: Objective measures were completed at Evaluation unless otherwise noted.  DIAGNOSTIC FINDINGS: Right knee arthroplasty in expected alignment. No periprosthetic lucency  or fracture. There has been patellar resurfacing. Recent postsurgical change includes air and edema in the soft tissues and joint space. Anterior skin staples in place.  PATIENT SURVEYS:  PSFS: THE PATIENT SPECIFIC FUNCTIONAL SCALE  Place score of 0-10 (0 = unable to perform activity and 10 = able to perform activity at the same level as before injury or problem)  Activity Date:  05/09/2024 06/02/2024   Walk 2 5   2.  Garden 0 0   3.  Safely transfer to and from the ground 0 0   4.      Total Score 0.67 1.67     Total Score = Sum of activity scores/number of activities  Minimally Detectable Change: 3 points (for single activity); 2 points (for average score)  [name] (nd). The Patient Specific Functional Scale . Retrieved from [email or web address]   COGNITION: Overall cognitive status: Within functional limits for tasks assessed     SENSATION: WFL  EDEMA:  Noted and not objectively assessed  LOWER EXTREMITY ROM:   ROM Left/Right 05/09/2024 Rt 05/13/24 Right 05/20/24 Right 05/21/24 Right 05/26/24 Right 05/28/24 Rt 06/02/2024 Right 06/04/24 Right 06/06/2024  Knee flexion 145/52 Supine:  A:  P:  Sitting:  P: 70 Supine AA: 76* Seated P: 78* Standing  P: flexion stretch 82* Seated P: 87* Supine  A: 76deg A: 89  P: 95 Active 93  Knee extension 0/7     Seated P: -12*  LAQ Supine:  A: 6deg of flexion A: -11 (seated LAQ) Active lacks 5   (Blank rows = not tested)  LOWER EXTREMITY STRENGTH:  In pounds with hand-held dynamometer Left/Right 05/09/2024 Right 06/04/24  Knee flexion    Knee extension 49.7/5.9 pounds 27.8#   (Blank rows = not tested)  GAIT: Distance walked: 50 feet Assistive device utilized: Environmental consultant - 2 wheeled Level of assistance: Modified independence Comments: Lashana is using her walker full-time at this point   TREATMENT DATE:  06/06/2024 Quadriceps sets with right heel prop 2 sets of  10 for 5 seconds Seated knee flexion AAROM 2 sets of 10 x 10 seconds Recumbent bike for AAROM 8 minutes Seat 5 with PT assistance  [id number]: Reinforced the importance of multiple times per day home exercise program compliance; reinforced the importance of continuing to push through the pain, particularly with flexion active range of motion activities; reviewed her home exercise program with instructions to focus on activities that address active range of motion, quadriceps strength and edema control; set a goal of AROM at 0 - 3 -100 degrees by the time she follows up with Dr. Vernetta next week.   06/04/24 TherAct UBE L1 seat 9, 8 x 10 min; partial to full revolutions Seated AA Rt knee flexion 10 x 10 sec hold with LLE providing overpressure Seated Rt LAQ x 10; 5 sec hold AA heel slides 2x10 with ball and strap on Rt Supine in 90 deg hip flexion knee flexion and ext holds with 3# weight x  10 sec each position x 10 reps  TherEx ROM and MMT - see above for details Reviewed HEP and encouraged working on flexion every hour that she's awake; and knee is extended when resting to maximize motion  Modalities Vaso x 10 min; mod pressure to Rt knee in elevation; 34 deg   05/28/2024 UBE with bilat UE and LE seat 9 level 1 with partial revolutions for 3 minutes, then switching between full revolutions (switching between fwd and back) and rocking for 5 minutes  Slant board gastroc stretch 3x30s  Bilat leg press with focus on knee flexion ROM 62# 2x12 with 2-3s hold Single leg leg press 2x12 with 37# for strengthening  PT discussed knee flexion exercises to perform at home  Manual:  Seated IR/distraction with active knee flexion and slight overpressure provided from Pt and PT performing knee extension PROM, 1x8 with 10s hold in each direction   Vaso:  Rt knee with towel under Rt ankle to promote knee extension, 6 minutes at medium compression at 34deg     PATIENT EDUCATION:  Education details:  See above Person educated: Patient Education method: Explanation, Demonstration, Tactile cues, Verbal cues, and Handouts Education comprehension: verbalized understanding, returned demonstration, verbal cues required, tactile cues required, and needs further education  HOME EXERCISE PROGRAM: Access Code: [email or web address] URL: [email or web address] Date: 05/28/2024 Prepared by: Grayce Spatz  Exercises - Seated Knee Flexion AAROM  - 10 x daily - 7 x weekly - 1 sets - 10 reps - 10 seconds hold - Supine Quadricep Sets  - 10 x daily - 7 x weekly - 10 sets - 10 reps - 5 second hold - Seated Knee Flexion Extension AROM   - 2-4 x daily - 7 x weekly - 2-3 sets - 10 reps - 5 seconds hold - Seated Knee Flexion Extension AAROM with Overpressure  - 2-4 x daily - 7 x weekly - 2-3 sets - 10 reps - 5 seconds hold - Seated Long Arc Quad  - 2-4 x daily - 7 x weekly - 2-3 sets - 10 reps - 5 seconds hold - Supine Heel Slide with Strap  - 2-4 x daily - 7 x weekly - 2-3 sets - 10 reps - 5 seconds hold - Standing Knee Flexion Stretch on Step  - 2-3 x daily - 7 x weekly - 3-5 sets - 3 reps - 10 seconds hold - standing calf stretch with forefoot on small step or brick  - 1-2 x daily - 7 x weekly - 1 sets - 3 reps - 30 seconds hold - Standing Heel Raise  - 1-2 x daily - 7 x weekly - 2-3 sets - 10 reps - 2-3 seconds hold - Tandem Stance  - 1 x daily - 7 x weekly - 3 sets - 2 reps - 30 seconds hold - Seated Hamstring Stretch with Strap  - 1-2 x daily - 7 x weekly - 1 sets - 3 reps - 30 seconds hold  ASSESSMENT:  CLINICAL IMPRESSION: Placida did a very good job of pushing herself during today's visit.  She realizes she is going to have to be very aggressive at home over the next few days to avoid a surgical manipulation.  Her active range of motion was better today than it was at her last supervised physical therapy visit and we set a goal of active range of motion at 0 - 3 -100 degrees to try to avoid  manipulation.  If Keilly gives the same effort at  home as she gave during today's visit, her prognosis remains very good.   OBJECTIVE IMPAIRMENTS: Abnormal gait, decreased activity tolerance, decreased endurance, decreased knowledge of condition, difficulty walking, decreased ROM, decreased strength, increased edema, and pain.   ACTIVITY LIMITATIONS: bending, sitting, standing, squatting, sleeping, stairs, transfers, bed mobility, dressing, and locomotion level  PARTICIPATION LIMITATIONS: community activity and yard work  PERSONAL FACTORS: Lt breast cancer, kidney stones, HTN, Lt TSA 2019 are also affecting patient's functional outcome.   REHAB POTENTIAL: Good  CLINICAL DECISION MAKING: Stable/uncomplicated  EVALUATION COMPLEXITY: Low   GOALS: Goals reviewed with patient? Yes  SHORT TERM GOALS: Target date: 06/20/2024  Sharian will be independent with her day 1 home exercise program Baseline: Started 05/09/2024 Goal status:   Met 06/06/2024  2.  Improve right knee active range of motion to 3 - 0 -95 degrees Baseline: 7 - 0 - 52 degrees Goal status: ONGOING 06/04/24  3.  Improve right quadriceps strength to at least 30 pounds Baseline: 5.9 pounds Goal status: ONGOING 06/04/24   LONG TERM GOALS: Target date: 08/01/2024  Improve patient specific functional score to at least 5 Baseline: 0.67 Goal status: Ongoing 06/04/24  2.  Raziya will report right knee pain consistently 3/10 or better on the numeric pain rating scale Baseline: Can be as high as 8/10 Goal status: ONGOING 06/04/24  3.  Improve right knee AROM to 2 - 0 -110 degrees Baseline: 7 - 0 - 52 degrees Goal status: ONGOING 06/04/24  4.  Improve bilateral quadriceps strength to 60 pounds or better Baseline: See objective Goal status: Ongoing   05/26/2024  5.  Katelin will be comfortable without the use of an assistive device within the house and with no more than a single-point cane outside the house at transfer into  independent rehabilitation Baseline: 2 wheeled walker full-time Goal status:  ONGOING 06/04/24  6.  We will be independent with a long-term home maintenance exercise program at discharge Baseline: Started 05/09/2024 Goal status: ONGOING 06/04/24   PLAN:  PT FREQUENCY: 2-3 times a week  PT DURATION: 12 weeks  PLANNED INTERVENTIONS: 97750- Physical Performance Testing, 97110-Therapeutic exercises, 97530- Therapeutic activity, 97112- Neuromuscular re-education, 97535- Self Care, [id number]- Manual therapy, [phone]- Gait training, [phone]- Electrical stimulation (unattended), [id number]- Vasopneumatic device, Patient/Family education, Balance training, Stair training, Joint mobilization, and Cryotherapy  PLAN FOR NEXT SESSION: Continue with ROM focus (wants to be able to walk 3 miles by December), check updated HEP.   continue with therapeutic exercise and activities to progress range and functional strength, manual therapy, gait training with cane for ramp & curb, vaso for edema  NEXT MD VISIT: not scheduled as of 06/04/24  Myer LELON Ivory, PT, MPT 06/06/24 5:29 PM

## 2024-06-09 ENCOUNTER — Ambulatory Visit

## 2024-06-09 DIAGNOSIS — R600 Localized edema: Secondary | ICD-10-CM

## 2024-06-09 DIAGNOSIS — M25561 Pain in right knee: Secondary | ICD-10-CM | POA: Diagnosis not present

## 2024-06-09 DIAGNOSIS — M25661 Stiffness of right knee, not elsewhere classified: Secondary | ICD-10-CM

## 2024-06-09 DIAGNOSIS — M6281 Muscle weakness (generalized): Secondary | ICD-10-CM | POA: Diagnosis not present

## 2024-06-09 DIAGNOSIS — R262 Difficulty in walking, not elsewhere classified: Secondary | ICD-10-CM

## 2024-06-09 NOTE — Therapy (Signed)
 OUTPATIENT PHYSICAL THERAPY TREATMENT NOTE   Patient Name: Brooke Cantrell MRN: [id number] DOB:11-14-1945, 78 y.o., female Today's Date: 06/09/2024  END OF SESSION:  PT End of Session - 06/09/24 1008     Visit Number 12    Number of Visits 24    Date for Recertification  08/01/24    Authorization Type MEDICARE    Progress Note Due on Visit 20    PT Start Time 1017    PT Stop Time 1114    PT Time Calculation (min) 57 min    Activity Tolerance Patient tolerated treatment well;No increased pain;Patient limited by pain    Behavior During Therapy [facility] for tasks assessed/performed           Past Medical History:  Diagnosis Date   Arthritis    Breast cancer (HCC)    Left breast   Depression    mild, no meds   History of kidney stones    Hypertension    Past Surgical History:  Procedure Laterality Date   breast cancer surgery     BREAST ENHANCEMENT SURGERY     BREAST LUMPECTOMY Left    CATARACT EXTRACTION, BILATERAL     ECTOPIC PREGNANCY SURGERY     EYE SURGERY     INNER EAR SURGERY     TONSILLECTOMY     TOTAL KNEE ARTHROPLASTY Right 04/22/2024   Procedure: ARTHROPLASTY, KNEE, TOTAL;  Surgeon: Vernetta Lonni GRADE, MD;  Location: [facility] OR;  Service: Orthopedics;  Laterality: Right;   TOTAL SHOULDER ARTHROPLASTY Left 04/25/2018   TOTAL SHOULDER ARTHROPLASTY Left 04/25/2018   Procedure: LEFT TOTAL SHOULDER REPLACEMENT;  Surgeon: Addie Cordella Hamilton, MD;  Location: [facility];  Service: Orthopedics;  Laterality: Left;   TUBAL LIGATION     Patient Active Problem List   Diagnosis Date Noted   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   Weakness    Hyperlipidemia    Acute respiratory failure due to COVID-19 (HCC) 06/10/2020   Pneumonia due to COVID-19 virus 06/10/2020   Hx of multiple pulmonary nodules 06/10/2020   Acute respiratory failure with hypoxia (HCC)    Lung nodule seen on imaging study 01/08/2020   History of breast cancer 04/14/2019   Sepsis (HCC)  04/02/2019   CAP (community acquired pneumonia) 04/02/2019   Elevated troponin 04/02/2019   Shoulder arthritis 04/25/2018   Primary osteoarthritis, 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   Chronic left shoulder pain 05/17/2017   Cervical disc disorder with radiculopathy 08/14/2016   Chronic infection of sinus 08/31/2015   Cough 08/31/2015   Allergic reaction 08/31/2015    PCP: Alda Carpen, MD  REFERRING PROVIDER: Lonni GRADE Vernetta, MD  REFERRING DIAG: [phone] (ICD-10-CM) - Status post total right knee replacement  THERAPY DIAG:  Acute pain of right knee  Stiffness of right knee, not elsewhere classified  Localized edema  Muscle weakness (generalized)  Difficulty in walking, not elsewhere classified  Rationale for Evaluation and Treatment: Rehabilitation  ONSET DATE: 04/22/2024  SUBJECTIVE:   SUBJECTIVE STATEMENT: Patient reporting increased stiffness this morning secondary to weather and typical soreness. Patient arrived without SPC.   PERTINENT HISTORY: Lt breast cancer, kidney stones, HTN, Lt TSA 2019  PAIN:  Are you having pain? Yes: NPRS scale: 0-8/10 this week Pain location: Rt knee Pain description: Stiff, achy, occasional sharp Aggravating factors: Prolonged postures and too much WB Relieving factors: Pain meds, ice, change positions frequently  PRECAUTIONS: None  RED FLAGS: None   WEIGHT BEARING RESTRICTIONS: No  FALLS:  Has patient  fallen in last 6 months? No  LIVING ENVIRONMENT: Lives with: lives with their family and grandson Lives in: House/apartment Stairs: Uses the rail with steps Has following equipment at home: Single point cane and Environmental consultant - 2 wheeled  OCCUPATION: Retired  PLOF: Independent  PATIENT GOALS: Return to gardening her acre lot, weed, plant in the garden    OBJECTIVE:  Note: Objective measures were completed at Evaluation unless otherwise noted.  DIAGNOSTIC FINDINGS: Right knee arthroplasty in expected alignment. No  periprosthetic lucency or fracture. There has been patellar resurfacing. Recent postsurgical change includes air and edema in the soft tissues and joint space. Anterior skin staples in place.  PATIENT SURVEYS:  PSFS: THE PATIENT SPECIFIC FUNCTIONAL SCALE  Place score of 0-10 (0 = unable to perform activity and 10 = able to perform activity at the same level as before injury or problem)  Activity Date:  05/09/2024 06/02/2024   Walk 2 5   2.  Garden 0 0   3.  Safely transfer to and from the ground 0 0   4.      Total Score 0.67 1.67     Total Score = Sum of activity scores/number of activities  Minimally Detectable Change: 3 points (for single activity); 2 points (for average score)  [name] (nd). The Patient Specific Functional Scale . Retrieved from [email or web address]   COGNITION: Overall cognitive status: Within functional limits for tasks assessed     SENSATION: WFL  EDEMA:  Noted and not objectively assessed  LOWER EXTREMITY ROM:   ROM Left/Right 05/09/2024 Rt 05/13/24 Right 05/20/24 Right 05/21/24 Right 05/26/24 Right 05/28/24 Rt 06/02/2024 Right 06/04/24 Right 06/06/2024 Rt 06/09/2024  Knee flexion 145/52 Supine:  A:  P:  Sitting:  P: 70 Supine AA: 76* Seated P: 78* Standing  P: flexion stretch 82* Seated P: 87* Supine  A: 76deg A: 89  P: 95 Active 93 AROM: 87 PROM: 91  Knee extension 0/7     Seated P: -12*  LAQ Supine:  A: 6deg of flexion A: -11 (seated LAQ) Active lacks 5 AROM: 10deg flexion PROM: 8deg flexion    (Blank rows = not tested)  LOWER EXTREMITY STRENGTH:  In pounds with hand-held dynamometer Left/Right 05/09/2024 Right 06/04/24  Knee flexion    Knee extension 49.7/5.9 pounds 27.8#   (Blank rows = not tested)  GAIT: Distance walked: 50 feet Assistive device utilized: Environmental consultant - 2 wheeled Level of assistance: Modified independence Comments: Binta is using her walker  full-time at this point   TREATMENT DATE:  06/10/2024 TherAct:  UBE with bilat UE and LE , level 1 for 10 minutes partial and full revolutions  Bilat LE leg press 1x12 with 75# , 1x12 and 1x22 with 87# Rt LE leg press 2x12 with 37# TKE with yellow TB 2x10 with 3s hold   TherEx: Slant board gastroc stretch 3x45s  PT discussed HEP, ROM, typical recovery from TKA vs manipulation, and surgical procedure of TKA  Vaso:  Rt LE elevated on wedge with towel under ankle for 10 minutes with medium pressure at 34deg   06/06/2024 Quadriceps sets with right heel prop 2 sets of 10 for 5 seconds Seated knee flexion AAROM 2 sets of 10 x 10 seconds Recumbent bike for AAROM 8 minutes Seat 5 with PT assistance  [id number]: Reinforced the importance of multiple times per day home exercise program compliance; reinforced the importance of continuing to push through the pain, particularly with flexion active range of motion activities;  reviewed her home exercise program with instructions to focus on activities that address active range of motion, quadriceps strength and edema control; set a goal of AROM at 0 - 3 -100 degrees by the time she follows up with Dr. Vernetta next week.   06/04/24 TherAct UBE L1 seat 9, 8 x 10 min; partial to full revolutions Seated AA Rt knee flexion 10 x 10 sec hold with LLE providing overpressure Seated Rt LAQ x 10; 5 sec hold AA heel slides 2x10 with ball and strap on Rt Supine in 90 deg hip flexion knee flexion and ext holds with 3# weight x 10 sec each position x 10 reps  TherEx ROM and MMT - see above for details Reviewed HEP and encouraged working on flexion every hour that she's awake; and knee is extended when resting to maximize motion  Modalities Vaso x 10 min; mod pressure to Rt knee in elevation; 34 deg   05/28/2024 UBE with bilat UE and LE seat 9 level 1 with partial revolutions for 3 minutes, then switching between full revolutions (switching between fwd and  back) and rocking for 5 minutes  Slant board gastroc stretch 3x30s  Bilat leg press with focus on knee flexion ROM 62# 2x12 with 2-3s hold Single leg leg press 2x12 with 37# for strengthening  PT discussed knee flexion exercises to perform at home  Manual:  Seated IR/distraction with active knee flexion and slight overpressure provided from Pt and PT performing knee extension PROM, 1x8 with 10s hold in each direction   Vaso:  Rt knee with towel under Rt ankle to promote knee extension, 6 minutes at medium compression at 34deg     PATIENT EDUCATION:  Education details: See above Person educated: Patient Education method: Explanation, Demonstration, Tactile cues, Verbal cues, and Handouts Education comprehension: verbalized understanding, returned demonstration, verbal cues required, tactile cues required, and needs further education  HOME EXERCISE PROGRAM: Access Code: [email or web address] URL: [email or web address] Date: 05/28/2024 Prepared by: Grayce Spatz  Exercises - Seated Knee Flexion AAROM  - 10 x daily - 7 x weekly - 1 sets - 10 reps - 10 seconds hold - Supine Quadricep Sets  - 10 x daily - 7 x weekly - 10 sets - 10 reps - 5 second hold - Seated Knee Flexion Extension AROM   - 2-4 x daily - 7 x weekly - 2-3 sets - 10 reps - 5 seconds hold - Seated Knee Flexion Extension AAROM with Overpressure  - 2-4 x daily - 7 x weekly - 2-3 sets - 10 reps - 5 seconds hold - Seated Long Arc Quad  - 2-4 x daily - 7 x weekly - 2-3 sets - 10 reps - 5 seconds hold - Supine Heel Slide with Strap  - 2-4 x daily - 7 x weekly - 2-3 sets - 10 reps - 5 seconds hold - Standing Knee Flexion Stretch on Step  - 2-3 x daily - 7 x weekly - 3-5 sets - 3 reps - 10 seconds hold - standing calf stretch with forefoot on small step or brick  - 1-2 x daily - 7 x weekly - 1 sets - 3 reps - 30 seconds hold - Standing Heel Raise  - 1-2 x daily - 7 x weekly - 2-3 sets - 10 reps - 2-3 seconds hold - Tandem Stance   - 1 x daily - 7 x weekly - 3 sets - 2 reps - 30 seconds hold - Seated Hamstring Stretch with Strap  -  1-2 x daily - 7 x weekly - 1 sets - 3 reps - 30 seconds hold  ASSESSMENT:  CLINICAL IMPRESSION: Patient arrived to session noting increased stiffness potentially due to weather change. Patient feeling discouraged, but PT discussing typical recovery and slow but steady improvements. Though patient had increased stiffness this session, she was able to reach 91 deg passive knee flexion. Patient tolerated all activities this date. Patient will continue to benefit from skilled PT.   OBJECTIVE IMPAIRMENTS: Abnormal gait, decreased activity tolerance, decreased endurance, decreased knowledge of condition, difficulty walking, decreased ROM, decreased strength, increased edema, and pain.   ACTIVITY LIMITATIONS: bending, sitting, standing, squatting, sleeping, stairs, transfers, bed mobility, dressing, and locomotion level  PARTICIPATION LIMITATIONS: community activity and yard work  PERSONAL FACTORS: Lt breast cancer, kidney stones, HTN, Lt TSA 2019 are also affecting patient's functional outcome.   REHAB POTENTIAL: Good  CLINICAL DECISION MAKING: Stable/uncomplicated  EVALUATION COMPLEXITY: Low   GOALS: Goals reviewed with patient? Yes  SHORT TERM GOALS: Target date: 06/20/2024  Carina will be independent with her day 1 home exercise program Baseline: Started 05/09/2024 Goal status:   Met 06/06/2024  2.  Improve right knee active range of motion to 3 - 0 -95 degrees Baseline: 7 - 0 - 52 degrees Goal status: ONGOING 06/04/24  3.  Improve right quadriceps strength to at least 30 pounds Baseline: 5.9 pounds Goal status: ONGOING 06/04/24   LONG TERM GOALS: Target date: 08/01/2024  Improve patient specific functional score to at least 5 Baseline: 0.67 Goal status: Ongoing 06/04/24  2.  Tasnim will report right knee pain consistently 3/10 or better on the numeric pain rating  scale Baseline: Can be as high as 8/10 Goal status: ONGOING 06/04/24  3.  Improve right knee AROM to 2 - 0 -110 degrees Baseline: 7 - 0 - 52 degrees Goal status: ONGOING 06/04/24  4.  Improve bilateral quadriceps strength to 60 pounds or better Baseline: See objective Goal status: Ongoing   05/26/2024  5.  Melaine will be comfortable without the use of an assistive device within the house and with no more than a single-point cane outside the house at transfer into independent rehabilitation Baseline: 2 wheeled walker full-time Goal status:  ONGOING 06/04/24  6.  We will be independent with a long-term home maintenance exercise program at discharge Baseline: Started 05/09/2024 Goal status: ONGOING 06/04/24   PLAN:  PT FREQUENCY: 2-3 times a week  PT DURATION: 12 weeks  PLANNED INTERVENTIONS: 97750- Physical Performance Testing, 97110-Therapeutic exercises, 97530- Therapeutic activity, 97112- Neuromuscular re-education, 97535- Self Care, 02859- Manual therapy, [phone]- Gait training, [phone]- Electrical stimulation (unattended), [id number]- Vasopneumatic device, Patient/Family education, Balance training, Stair training, Joint mobilization, and Cryotherapy  PLAN FOR NEXT SESSION:  Continue with ROM focus (wants to be able to walk 3 miles by December), check updated HEP.   continue with therapeutic exercise and activities to progress range and functional strength, manual therapy, gait training with cane for ramp & curb, vaso for edema  NEXT MD VISIT: 06/11/2024  Susannah Daring, PT, DPT 06/09/24 11:29 AM

## 2024-06-10 NOTE — Therapy (Signed)
 OUTPATIENT PHYSICAL THERAPY TREATMENT NOTE   Patient Name: Brooke Cantrell MRN: [id number] DOB:1945/09/13, 78 y.o., female Today's Date: 06/10/2024  END OF SESSION:     Past Medical History:  Diagnosis Date   Arthritis    Breast cancer (HCC)    Left breast   Depression    mild, no meds   History of kidney stones    Hypertension    Past Surgical History:  Procedure Laterality Date   breast cancer surgery     BREAST ENHANCEMENT SURGERY     BREAST LUMPECTOMY Left    CATARACT EXTRACTION, BILATERAL     ECTOPIC PREGNANCY SURGERY     EYE SURGERY     INNER EAR SURGERY     TONSILLECTOMY     TOTAL KNEE ARTHROPLASTY Right 04/22/2024   Procedure: ARTHROPLASTY, KNEE, TOTAL;  Surgeon: Vernetta Lonni GRADE, MD;  Location: [facility] OR;  Service: Orthopedics;  Laterality: Right;   TOTAL SHOULDER ARTHROPLASTY Left 04/25/2018   TOTAL SHOULDER ARTHROPLASTY Left 04/25/2018   Procedure: LEFT TOTAL SHOULDER REPLACEMENT;  Surgeon: Addie Cordella Hamilton, MD;  Location: [facility] OR;  Service: Orthopedics;  Laterality: Left;   TUBAL LIGATION     Patient Active Problem List   Diagnosis Date Noted   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   Weakness    Hyperlipidemia    Acute respiratory failure due to COVID-19 (HCC) 06/10/2020   Pneumonia due to COVID-19 virus 06/10/2020   Hx of multiple pulmonary nodules 06/10/2020   Acute respiratory failure with hypoxia (HCC)    Lung nodule seen on imaging study 01/08/2020   History of breast cancer 04/14/2019   Sepsis (HCC) 04/02/2019   CAP (community acquired pneumonia) 04/02/2019   Elevated troponin 04/02/2019   Shoulder arthritis 04/25/2018   Primary osteoarthritis, 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   Chronic left shoulder pain 05/17/2017   Cervical disc disorder with radiculopathy 08/14/2016   Chronic infection of sinus 08/31/2015   Cough 08/31/2015   Allergic reaction 08/31/2015    PCP: Alda Carpen, MD  REFERRING PROVIDER: Lonni GRADE Vernetta, MD  REFERRING DIAG: [phone] (ICD-10-CM) - Status post total right knee replacement  THERAPY DIAG:  No diagnosis found.  Rationale for Evaluation and Treatment: Rehabilitation  ONSET DATE: 04/22/2024  SUBJECTIVE:   SUBJECTIVE STATEMENT: *** Patient reporting increased stiffness this morning secondary to weather and typical soreness. Patient arrived without SPC.   PERTINENT HISTORY: Lt breast cancer, kidney stones, HTN, Lt TSA 2019  PAIN:  Are you having pain? Yes: NPRS scale: 0-8/10 this week Pain location: Rt knee Pain description: Stiff, achy, occasional sharp Aggravating factors: Prolonged postures and too much WB Relieving factors: Pain meds, ice, change positions frequently  PRECAUTIONS: None  RED FLAGS: None   WEIGHT BEARING RESTRICTIONS: No  FALLS:  Has patient fallen in last 6 months? No  LIVING ENVIRONMENT: Lives with: lives with their family and grandson Lives in: House/apartment Stairs: Uses the rail with steps Has following equipment at home: Single point cane and Environmental consultant - 2 wheeled  OCCUPATION: Retired  PLOF: Independent  PATIENT GOALS: Return to gardening her acre lot, weed, plant in the garden    OBJECTIVE:  Note: Objective measures were completed at Evaluation unless otherwise noted.  DIAGNOSTIC FINDINGS: Right knee arthroplasty in expected alignment. No periprosthetic lucency or fracture. There has been patellar resurfacing. Recent postsurgical change includes air and edema in the soft tissues and joint space. Anterior skin staples in place.  PATIENT SURVEYS:  PSFS: THE PATIENT SPECIFIC FUNCTIONAL  SCALE  Place score of 0-10 (0 = unable to perform activity and 10 = able to perform activity at the same level as before injury or problem)  Activity Date:  05/09/2024 06/02/2024   Walk 2 5   2.  Garden 0 0   3.  Safely transfer to and from the ground 0 0   4.      Total Score 0.67 1.67     Total Score = Sum of activity  scores/number of activities  Minimally Detectable Change: 3 points (for single activity); 2 points (for average score)  [name] (nd). The Patient Specific Functional Scale . Retrieved from [email or web address]   COGNITION: Overall cognitive status: Within functional limits for tasks assessed     SENSATION: WFL  EDEMA:  Noted and not objectively assessed  LOWER EXTREMITY ROM:   ROM Left/Right 05/09/2024 Rt 05/13/24 Right 05/20/24 Right 05/21/24 Right 05/26/24 Right 05/28/24 Rt 06/02/2024 Right 06/04/24 Right 06/06/2024 Rt 06/09/2024  Knee flexion 145/52 Supine:  A:  P:  Sitting:  P: 70 Supine AA: 76* Seated P: 78* Standing  P: flexion stretch 82* Seated P: 87* Supine  A: 76deg A: 89  P: 95 Active 93 AROM: 87 PROM: 91  Knee extension 0/7     Seated P: -12*  LAQ Supine:  A: 6deg of flexion A: -11 (seated LAQ) Active lacks 5 AROM: 10deg flexion PROM: 8deg flexion    (Blank rows = not tested)  LOWER EXTREMITY STRENGTH:  In pounds with hand-held dynamometer Left/Right 05/09/2024 Right 06/04/24  Knee flexion    Knee extension 49.7/5.9 pounds 27.8#   (Blank rows = not tested)  GAIT: Distance walked: 50 feet Assistive device utilized: Environmental consultant - 2 wheeled Level of assistance: Modified independence Comments: Nasira is using her walker full-time at this point   TREATMENT DATE:  06/11/2024 ***   06/10/2024 TherAct:  UBE with bilat UE and LE , level 1 for 10 minutes partial and full revolutions  Bilat LE leg press 1x12 with 75# , 1x12 and 1x22 with 87# Rt LE leg press 2x12 with 37# TKE with yellow TB 2x10 with 3s hold   TherEx: Slant board gastroc stretch 3x45s  PT discussed HEP, ROM, typical recovery from TKA vs manipulation, and surgical procedure of TKA  Vaso:  Rt LE elevated on wedge with towel under ankle for 10 minutes with medium pressure at 34deg   06/06/2024 Quadriceps sets with right heel prop  2 sets of 10 for 5 seconds Seated knee flexion AAROM 2 sets of 10 x 10 seconds Recumbent bike for AAROM 8 minutes Seat 5 with PT assistance  [id number]: Reinforced the importance of multiple times per day home exercise program compliance; reinforced the importance of continuing to push through the pain, particularly with flexion active range of motion activities; reviewed her home exercise program with instructions to focus on activities that address active range of motion, quadriceps strength and edema control; set a goal of AROM at 0 - 3 -100 degrees by the time she follows up with Dr. Vernetta next week.   06/04/24 TherAct UBE L1 seat 9, 8 x 10 min; partial to full revolutions Seated AA Rt knee flexion 10 x 10 sec hold with LLE providing overpressure Seated Rt LAQ x 10; 5 sec hold AA heel slides 2x10 with ball and strap on Rt Supine in 90 deg hip flexion knee flexion and ext holds with 3# weight x 10 sec each position x 10 reps  TherEx ROM  and MMT - see above for details Reviewed HEP and encouraged working on flexion every hour that she's awake; and knee is extended when resting to maximize motion  Modalities Vaso x 10 min; mod pressure to Rt knee in elevation; 34 deg     PATIENT EDUCATION:  Education details: See above Person educated: Patient Education method: Explanation, Demonstration, Tactile cues, Verbal cues, and Handouts Education comprehension: verbalized understanding, returned demonstration, verbal cues required, tactile cues required, and needs further education  HOME EXERCISE PROGRAM: Access Code: [email or web address] URL: [email or web address] Date: 05/28/2024 Prepared by: Grayce Spatz  Exercises - Seated Knee Flexion AAROM  - 10 x daily - 7 x weekly - 1 sets - 10 reps - 10 seconds hold - Supine Quadricep Sets  - 10 x daily - 7 x weekly - 10 sets - 10 reps - 5 second hold - Seated Knee Flexion Extension AROM   - 2-4 x daily - 7 x weekly - 2-3 sets - 10 reps - 5  seconds hold - Seated Knee Flexion Extension AAROM with Overpressure  - 2-4 x daily - 7 x weekly - 2-3 sets - 10 reps - 5 seconds hold - Seated Long Arc Quad  - 2-4 x daily - 7 x weekly - 2-3 sets - 10 reps - 5 seconds hold - Supine Heel Slide with Strap  - 2-4 x daily - 7 x weekly - 2-3 sets - 10 reps - 5 seconds hold - Standing Knee Flexion Stretch on Step  - 2-3 x daily - 7 x weekly - 3-5 sets - 3 reps - 10 seconds hold - standing calf stretch with forefoot on small step or brick  - 1-2 x daily - 7 x weekly - 1 sets - 3 reps - 30 seconds hold - Standing Heel Raise  - 1-2 x daily - 7 x weekly - 2-3 sets - 10 reps - 2-3 seconds hold - Tandem Stance  - 1 x daily - 7 x weekly - 3 sets - 2 reps - 30 seconds hold - Seated Hamstring Stretch with Strap  - 1-2 x daily - 7 x weekly - 1 sets - 3 reps - 30 seconds hold  ASSESSMENT:  CLINICAL IMPRESSION: *** Patient arrived to session noting increased stiffness potentially due to weather change. Patient feeling discouraged, but PT discussing typical recovery and slow but steady improvements. Though patient had increased stiffness this session, she was able to reach 91 deg passive knee flexion. Patient tolerated all activities this date. Patient will continue to benefit from skilled PT.   OBJECTIVE IMPAIRMENTS: Abnormal gait, decreased activity tolerance, decreased endurance, decreased knowledge of condition, difficulty walking, decreased ROM, decreased strength, increased edema, and pain.   ACTIVITY LIMITATIONS: bending, sitting, standing, squatting, sleeping, stairs, transfers, bed mobility, dressing, and locomotion level  PARTICIPATION LIMITATIONS: community activity and yard work  PERSONAL FACTORS: Lt breast cancer, kidney stones, HTN, Lt TSA 2019 are also affecting patient's functional outcome.   REHAB POTENTIAL: Good  CLINICAL DECISION MAKING: Stable/uncomplicated  EVALUATION COMPLEXITY: Low   GOALS: Goals reviewed with patient?  Yes  SHORT TERM GOALS: Target date: 06/20/2024  Sameen will be independent with her day 1 home exercise program Baseline: Started 05/09/2024 Goal status:   Met 06/06/2024  2.  Improve right knee active range of motion to 3 - 0 -95 degrees Baseline: 7 - 0 - 52 degrees Goal status: ONGOING 06/04/24  3.  Improve right quadriceps strength to at least 30 pounds Baseline: 5.9 pounds Goal  status: ONGOING 06/04/24   LONG TERM GOALS: Target date: 08/01/2024  Improve patient specific functional score to at least 5 Baseline: 0.67 Goal status: Ongoing 06/04/24  2.  Ltanya will report right knee pain consistently 3/10 or better on the numeric pain rating scale Baseline: Can be as high as 8/10 Goal status: ONGOING 06/04/24  3.  Improve right knee AROM to 2 - 0 -110 degrees Baseline: 7 - 0 - 52 degrees Goal status: ONGOING 06/04/24  4.  Improve bilateral quadriceps strength to 60 pounds or better Baseline: See objective Goal status: Ongoing   05/26/2024  5.  Vitalia will be comfortable without the use of an assistive device within the house and with no more than a single-point cane outside the house at transfer into independent rehabilitation Baseline: 2 wheeled walker full-time Goal status:  ONGOING 06/04/24  6.  We will be independent with a long-term home maintenance exercise program at discharge Baseline: Started 05/09/2024 Goal status: ONGOING 06/04/24   PLAN:  PT FREQUENCY: 2-3 times a week  PT DURATION: 12 weeks  PLANNED INTERVENTIONS: 97750- Physical Performance Testing, 97110-Therapeutic exercises, 97530- Therapeutic activity, 97112- Neuromuscular re-education, 97535- Self Care, [id number]- Manual therapy, [phone]- Gait training, [phone]- Electrical stimulation (unattended), [id number]- Vasopneumatic device, Patient/Family education, Balance training, Stair training, Joint mobilization, and Cryotherapy  PLAN FOR NEXT SESSION:  *** Continue with ROM focus (wants to be able to walk 3 miles by December),  check updated HEP.   continue with therapeutic exercise and activities to progress range and functional strength, manual therapy, gait training with cane for ramp & curb, vaso for edema  NEXT MD VISIT: 06/11/2024  Susannah Daring, PT, DPT 06/10/24 7:50 AM

## 2024-06-11 ENCOUNTER — Ambulatory Visit (INDEPENDENT_AMBULATORY_CARE_PROVIDER_SITE_OTHER)

## 2024-06-11 ENCOUNTER — Ambulatory Visit: Admitting: Orthopaedic Surgery

## 2024-06-11 ENCOUNTER — Encounter: Payer: Self-pay | Admitting: Orthopaedic Surgery

## 2024-06-11 DIAGNOSIS — M24661 Ankylosis, right knee: Secondary | ICD-10-CM

## 2024-06-11 DIAGNOSIS — M25661 Stiffness of right knee, not elsewhere classified: Secondary | ICD-10-CM

## 2024-06-11 DIAGNOSIS — M25561 Pain in right knee: Secondary | ICD-10-CM | POA: Diagnosis not present

## 2024-06-11 DIAGNOSIS — R600 Localized edema: Secondary | ICD-10-CM

## 2024-06-11 DIAGNOSIS — M6281 Muscle weakness (generalized): Secondary | ICD-10-CM

## 2024-06-11 DIAGNOSIS — Z96651 Presence of right artificial knee joint: Secondary | ICD-10-CM

## 2024-06-11 DIAGNOSIS — R262 Difficulty in walking, not elsewhere classified: Secondary | ICD-10-CM

## 2024-06-11 NOTE — Progress Notes (Signed)
 The patient is now 7 weeks status post a right total knee replacement.  She is still struggling with getting her range of motion back in spite of her pushing herself very hard and working through physical therapy.  2 weeks ago we did place a steroid injection in the knee hoping she can push past the pain.  She is a very active 78 year old female.  She says is very stiff in the morning.  She is walking without an assistive device and looks good overall.  On examination of her right knee she lacks full extension by a few degrees but I can only flex her to 90 degrees.  We talked in length in detail about a manipulation under anesthesia.  We discussed what that surgery involves and the risk and benefits of this manipulation and what it means.  I do feel that she would benefit most from a manipulation of the knee.  She agrees with this as well so we will work on getting this set up and then she will continue aggressive physical therapy afterwards.  At her next visit I would like a standing AP and lateral of her right operative knee.

## 2024-06-13 ENCOUNTER — Encounter: Payer: Self-pay | Admitting: Rehabilitative and Restorative Service Providers"

## 2024-06-13 ENCOUNTER — Ambulatory Visit: Admitting: Rehabilitative and Restorative Service Providers"

## 2024-06-13 DIAGNOSIS — R262 Difficulty in walking, not elsewhere classified: Secondary | ICD-10-CM

## 2024-06-13 DIAGNOSIS — R600 Localized edema: Secondary | ICD-10-CM | POA: Diagnosis not present

## 2024-06-13 DIAGNOSIS — M25661 Stiffness of right knee, not elsewhere classified: Secondary | ICD-10-CM | POA: Diagnosis not present

## 2024-06-13 DIAGNOSIS — M25561 Pain in right knee: Secondary | ICD-10-CM

## 2024-06-13 DIAGNOSIS — M6281 Muscle weakness (generalized): Secondary | ICD-10-CM

## 2024-06-13 NOTE — Therapy (Signed)
 OUTPATIENT PHYSICAL THERAPY TREATMENT NOTE   Patient Name: VIENNE CORCORAN MRN: [id number] DOB:1946/04/13, 78 y.o., female Today's Date: 06/13/2024  END OF SESSION:  PT End of Session - 06/13/24 1408     Visit Number 14    Number of Visits 24    Date for Recertification  08/01/24    Authorization Type MEDICARE    Progress Note Due on Visit 20    PT Start Time 1017    PT Stop Time 1110    PT Time Calculation (min) 53 min    Activity Tolerance Patient tolerated treatment well;No increased pain;Patient limited by pain    Behavior During Therapy [facility] for tasks assessed/performed             Past Medical History:  Diagnosis Date   Arthritis    Breast cancer (HCC)    Left breast   Depression    mild, no meds   History of kidney stones    Hypertension    Past Surgical History:  Procedure Laterality Date   breast cancer surgery     BREAST ENHANCEMENT SURGERY     BREAST LUMPECTOMY Left    CATARACT EXTRACTION, BILATERAL     ECTOPIC PREGNANCY SURGERY     EYE SURGERY     INNER EAR SURGERY     TONSILLECTOMY     TOTAL KNEE ARTHROPLASTY Right 04/22/2024   Procedure: ARTHROPLASTY, KNEE, TOTAL;  Surgeon: Vernetta Lonni GRADE, MD;  Location: [facility] OR;  Service: Orthopedics;  Laterality: Right;   TOTAL SHOULDER ARTHROPLASTY Left 04/25/2018   TOTAL SHOULDER ARTHROPLASTY Left 04/25/2018   Procedure: LEFT TOTAL SHOULDER REPLACEMENT;  Surgeon: Addie Cordella Hamilton, MD;  Location: [facility] OR;  Service: Orthopedics;  Laterality: Left;   TUBAL LIGATION     Patient Active Problem List   Diagnosis Date Noted   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   Weakness    Hyperlipidemia    Acute respiratory failure due to COVID-19 (HCC) 06/10/2020   Pneumonia due to COVID-19 virus 06/10/2020   Hx of multiple pulmonary nodules 06/10/2020   Acute respiratory failure with hypoxia (HCC)    Lung nodule seen on imaging study 01/08/2020   History of breast cancer 04/14/2019   Sepsis (HCC)  04/02/2019   CAP (community acquired pneumonia) 04/02/2019   Elevated troponin 04/02/2019   Shoulder arthritis 04/25/2018   Primary osteoarthritis, 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   Chronic left shoulder pain 05/17/2017   Cervical disc disorder with radiculopathy 08/14/2016   Chronic infection of sinus 08/31/2015   Cough 08/31/2015   Allergic reaction 08/31/2015    PCP: Alda Carpen, MD  REFERRING PROVIDER: Lonni GRADE Vernetta, MD  REFERRING DIAG: [phone] (ICD-10-CM) - Status post total right knee replacement  THERAPY DIAG:  Acute pain of right knee  Stiffness of right knee, not elsewhere classified  Localized edema  Muscle weakness (generalized)  Difficulty in walking, not elsewhere classified  Rationale for Evaluation and Treatment: Rehabilitation  ONSET DATE: 04/22/2024  SUBJECTIVE:   SUBJECTIVE STATEMENT: Rosann has a manipulation scheduled for next Thursday October 9.  PERTINENT HISTORY: Lt breast cancer, kidney stones, HTN, Lt TSA 2019  PAIN:  Are you having pain? Yes: NPRS scale: 0-8/10 this week Pain location: Rt knee Pain description: Stiff, achy, occasional sharp Aggravating factors: Prolonged postures and too much WB Relieving factors: Pain meds, ice, change positions frequently  PRECAUTIONS: None  RED FLAGS: None   WEIGHT BEARING RESTRICTIONS: No  FALLS:  Has patient fallen in last 6 months?  No  LIVING ENVIRONMENT: Lives with: lives with their family and grandson Lives in: House/apartment Stairs: Uses the rail with steps Has following equipment at home: Single point cane and Environmental consultant - 2 wheeled  OCCUPATION: Retired  PLOF: Independent  PATIENT GOALS: Return to gardening her acre lot, weed, plant in the garden    OBJECTIVE:  Note: Objective measures were completed at Evaluation unless otherwise noted.  DIAGNOSTIC FINDINGS: Right knee arthroplasty in expected alignment. No periprosthetic lucency or fracture. There has been patellar  resurfacing. Recent postsurgical change includes air and edema in the soft tissues and joint space. Anterior skin staples in place.  PATIENT SURVEYS:  PSFS: THE PATIENT SPECIFIC FUNCTIONAL SCALE  Place score of 0-10 (0 = unable to perform activity and 10 = able to perform activity at the same level as before injury or problem)  Activity Date:  05/09/2024 06/02/2024   Walk 2 5   2.  Garden 0 0   3.  Safely transfer to and from the ground 0 0   4.      Total Score 0.67 1.67     Total Score = Sum of activity scores/number of activities  Minimally Detectable Change: 3 points (for single activity); 2 points (for average score)  [name] [facility] (nd). The Patient Specific Functional Scale . Retrieved from [email or web address]   COGNITION: Overall cognitive status: Within functional limits for tasks assessed     SENSATION: WFL  EDEMA:  Noted and not objectively assessed  LOWER EXTREMITY ROM:   ROM Left/Right 05/09/2024 Rt 05/13/24 Right 05/20/24 Right 05/21/24 Right 05/26/24 Right 05/28/24 Rt 06/02/2024 Right 06/04/24 Right 06/06/2024 Rt 06/09/2024 Right 06/13/2024  Knee flexion 145/52 Supine:  A:  P:  Sitting:  P: 70 Supine AA: 76* Seated P: 78* Standing  P: flexion stretch 82* Seated P: 87* Supine  A: 76deg A: 89  P: 95 Active 93 AROM: 87 PROM: 91 Active 97  Knee extension 0/7     Seated P: -12*  LAQ Supine:  A: 6deg of flexion A: -11 (seated LAQ) Active lacks 5 AROM: 10deg flexion PROM: 8deg flexion  Active 5   (Blank rows = not tested)  LOWER EXTREMITY STRENGTH:  In pounds with hand-held dynamometer Left/Right 05/09/2024 Right 06/04/24  Knee flexion    Knee extension 49.7/5.9 pounds 27.8#   (Blank rows = not tested)  GAIT: Distance walked: 50 feet Assistive device utilized: Environmental consultant - 2 wheeled Level of assistance: Modified independence Comments: [place] is using her walker full-time at this  point   TREATMENT DATE:  06/13/2024 Quad sets with right heel prop 2 sets of 10 for 5 seconds Seated knee flexion AAROM (left pushes right into flexion) 10 x 10 seconds Recumbent bike Seat 6 for 8 minutes (AAROM to 10 full revolutions in each direction)  Manual: Knee flexion with PT assist 25 x 10 seconds  Vaso Right Knee 10 minutes High 34*   06/11/2024 TherAct:   UBE with bilat UE and LE , level 1 for 10 minutes full revolutions  Bilat LE press 1x15 with 75#, 2x15 with 93#  Rt Le leg press 3x12 with 43#  Step downs with 4 step 1x10, with 6 step 1x10 with bilat UE  Patient performed reciprocal pattern on full set of stairs with bilat UE use on handrails with CGA throughout  Intermittent circumduction with Rt LE and highly fearful of stairs  Improved performance with ascending vs descending  PT discussed manipulation procedure and pain levels following, what to  expect with POC following procedure, fear/anxiety with stairs, and potential requirement for increased balance activities    06/10/2024 TherAct:  UBE with bilat UE and LE , level 1 for 10 minutes partial and full revolutions  Bilat LE leg press 1x12 with 75# , 1x12 and 1x22 with 87# Rt LE leg press 2x12 with 37# TKE with yellow TB 2x10 with 3s hold   TherEx: Slant board gastroc stretch 3x45s  PT discussed HEP, ROM, typical recovery from TKA vs manipulation, and surgical procedure of TKA  Vaso:  Rt LE elevated on wedge with towel under ankle for 10 minutes with medium pressure at 34deg   PATIENT EDUCATION:  Education details: See above Person educated: Patient Education method: Explanation, Demonstration, Tactile cues, Verbal cues, and Handouts Education comprehension: verbalized understanding, returned demonstration, verbal cues required, tactile cues required, and needs further education  HOME EXERCISE PROGRAM: Access Code: [email or web address] URL: [email or web address] Date: 05/28/2024 Prepared by: Grayce Spatz  Exercises - Seated Knee Flexion AAROM  - 10 x daily - 7 x weekly - 1 sets - 10 reps - 10 seconds hold - Supine Quadricep Sets  - 10 x daily - 7 x weekly - 10 sets - 10 reps - 5 second hold - Seated Knee Flexion Extension AROM   - 2-4 x daily - 7 x weekly - 2-3 sets - 10 reps - 5 seconds hold - Seated Knee Flexion Extension AAROM with Overpressure  - 2-4 x daily - 7 x weekly - 2-3 sets - 10 reps - 5 seconds hold - Seated Long Arc Quad  - 2-4 x daily - 7 x weekly - 2-3 sets - 10 reps - 5 seconds hold - Supine Heel Slide with Strap  - 2-4 x daily - 7 x weekly - 2-3 sets - 10 reps - 5 seconds hold - Standing Knee Flexion Stretch on Step  - 2-3 x daily - 7 x weekly - 3-5 sets - 3 reps - 10 seconds hold - standing calf stretch with forefoot on small step or brick  - 1-2 x daily - 7 x weekly - 1 sets - 3 reps - 30 seconds hold - Standing Heel Raise  - 1-2 x daily - 7 x weekly - 2-3 sets - 10 reps - 2-3 seconds hold - Tandem Stance  - 1 x daily - 7 x weekly - 3 sets - 2 reps - 30 seconds hold - Seated Hamstring Stretch with Strap  - 1-2 x daily - 7 x weekly - 1 sets - 3 reps - 30 seconds hold  ASSESSMENT:  CLINICAL IMPRESSION: AROM was 0 - 5 - 97 degrees today (100 passively).  Shilo was also able to complete full revolutions on the bicycle today, demonstrating increased flexion active range of motion.  This is consistent with her objective measurements which are better as compared to the last time I saw her a week ago.  I encouraged Yanai to continue to work on her flexion and extension active range of motion and quadriceps strength leading up to her scheduled manipulation on Thursday.  We will see her on a daily basis for the 2 weeks following her manipulation.  OBJECTIVE IMPAIRMENTS: Abnormal gait, decreased activity tolerance, decreased endurance, decreased knowledge of condition, difficulty walking, decreased ROM, decreased strength, increased edema, and pain.   ACTIVITY LIMITATIONS:  bending, sitting, standing, squatting, sleeping, stairs, transfers, bed mobility, dressing, and locomotion level  PARTICIPATION LIMITATIONS: community activity and yard work  PERSONAL FACTORS: Lt breast cancer,  kidney stones, HTN, Lt TSA 2019 are also affecting patient's functional outcome.   REHAB POTENTIAL: Good  CLINICAL DECISION MAKING: Stable/uncomplicated  EVALUATION COMPLEXITY: Low   GOALS: Goals reviewed with patient? Yes  SHORT TERM GOALS: Target date: 06/20/2024  Ebonee will be independent with her day 1 home exercise program Baseline: Started 05/09/2024 Goal status:   Met 06/06/2024  2.  Improve right knee active range of motion to 3 - 0 -95 degrees Baseline: 7 - 0 - 52 degrees Goal status: Partially Met 06/13/2024  3.  Improve right quadriceps strength to at least 30 pounds Baseline: 5.9 pounds Goal status: ONGOING 06/04/24   LONG TERM GOALS: Target date: 08/01/2024  Improve patient specific functional score to at least 5 Baseline: 0.67 Goal status: Ongoing 06/04/24  2.  Aryana will report right knee pain consistently 3/10 or better on the numeric pain rating scale Baseline: Can be as high as 8/10 Goal status: ONGOING 06/13/24  3.  Improve right knee AROM to 2 - 0 -110 degrees Baseline: 7 - 0 - 52 degrees Goal status: ONGOING 06/13/24  4.  Improve bilateral quadriceps strength to 60 pounds or better Baseline: See objective Goal status: Ongoing   05/26/2024  5.  Anahita will be comfortable without the use of an assistive device within the house and with no more than a single-point cane outside the house at transfer into independent rehabilitation Baseline: 2 wheeled walker full-time Goal status:  Met 06/13/2024  6.  We will be independent with a long-term home maintenance exercise program at discharge Baseline: Started 05/09/2024 Goal status: ONGOING 06/13/24   PLAN:  PT FREQUENCY: 2-3 times a week  PT DURATION: 12 weeks  PLANNED INTERVENTIONS: 97750- Physical  Performance Testing, 97110-Therapeutic exercises, 97530- Therapeutic activity, 97112- Neuromuscular re-education, 97535- Self Care, [id number]- Manual therapy, [phone]- Gait training, [phone]- Electrical stimulation (unattended), [id number]- Vasopneumatic device, Patient/Family education, Balance training, Stair training, Joint mobilization, and Cryotherapy  PLAN FOR NEXT SESSION:   Continue with ROM focus (wants to be able to walk 3 miles by December), check updated HEP.   Continue with therapeutic exercise and activities to progress range and functional strength, manual therapy, vaso for edema, balance, ambulation on ramps and stairs (highly fearful of stairs).  NEXT MD VISIT: 06/11/2024  Myer LELON Ivory, PT, MPT 06/13/24 2:14 PM

## 2024-06-16 ENCOUNTER — Encounter: Payer: Self-pay | Admitting: Physical Therapy

## 2024-06-16 ENCOUNTER — Ambulatory Visit (INDEPENDENT_AMBULATORY_CARE_PROVIDER_SITE_OTHER): Admitting: Physical Therapy

## 2024-06-16 DIAGNOSIS — R262 Difficulty in walking, not elsewhere classified: Secondary | ICD-10-CM

## 2024-06-16 DIAGNOSIS — M25561 Pain in right knee: Secondary | ICD-10-CM

## 2024-06-16 DIAGNOSIS — M25661 Stiffness of right knee, not elsewhere classified: Secondary | ICD-10-CM | POA: Diagnosis not present

## 2024-06-16 DIAGNOSIS — M6281 Muscle weakness (generalized): Secondary | ICD-10-CM | POA: Diagnosis not present

## 2024-06-16 DIAGNOSIS — R600 Localized edema: Secondary | ICD-10-CM

## 2024-06-16 NOTE — Therapy (Signed)
 OUTPATIENT PHYSICAL THERAPY TREATMENT NOTE   Patient Name: Brooke Cantrell MRN: [id number] DOB:11-Jun-1946, 78 y.o., female Today's Date: 06/16/2024  END OF SESSION:  PT End of Session - 06/16/24 0841     Visit Number 15    Number of Visits 24    Date for Recertification  08/01/24    Authorization Type MEDICARE    Progress Note Due on Visit 20    PT Start Time 0841    PT Stop Time 0940    PT Time Calculation (min) 59 min    Activity Tolerance Patient tolerated treatment well;No increased pain;Patient limited by pain    Behavior During Therapy [facility] for tasks assessed/performed              Past Medical History:  Diagnosis Date   Arthritis    Breast cancer (HCC)    Left breast   Depression    mild, no meds   History of kidney stones    Hypertension    Past Surgical History:  Procedure Laterality Date   breast cancer surgery     BREAST ENHANCEMENT SURGERY     BREAST LUMPECTOMY Left    CATARACT EXTRACTION, BILATERAL     ECTOPIC PREGNANCY SURGERY     EYE SURGERY     INNER EAR SURGERY     TONSILLECTOMY     TOTAL KNEE ARTHROPLASTY Right 04/22/2024   Procedure: ARTHROPLASTY, KNEE, TOTAL;  Surgeon: Vernetta Lonni GRADE, MD;  Location: [facility] OR;  Service: Orthopedics;  Laterality: Right;   TOTAL SHOULDER ARTHROPLASTY Left 04/25/2018   TOTAL SHOULDER ARTHROPLASTY Left 04/25/2018   Procedure: LEFT TOTAL SHOULDER REPLACEMENT;  Surgeon: Brooke Cantrell Cordella Hamilton, MD;  Location: [facility] OR;  Service: Orthopedics;  Laterality: Left;   TUBAL LIGATION     Patient Active Problem List   Diagnosis Date Noted   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   Weakness    Hyperlipidemia    Acute respiratory failure due to COVID-19 (HCC) 06/10/2020   Pneumonia due to COVID-19 virus 06/10/2020   Hx of multiple pulmonary nodules 06/10/2020   Acute respiratory failure with hypoxia (HCC)    Lung nodule seen on imaging study 01/08/2020   History of breast cancer 04/14/2019   Sepsis (HCC)  04/02/2019   CAP (community acquired pneumonia) 04/02/2019   Elevated troponin 04/02/2019   Shoulder arthritis 04/25/2018   Primary osteoarthritis, 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   Chronic left shoulder pain 05/17/2017   Cervical disc disorder with radiculopathy 08/14/2016   Chronic infection of sinus 08/31/2015   Cough 08/31/2015   Allergic reaction 08/31/2015    PCP: Alda Carpen, MD  REFERRING PROVIDER: Lonni GRADE Vernetta, MD  REFERRING DIAG: [phone] (ICD-10-CM) - Status post total right knee replacement  THERAPY DIAG:  Acute pain of right knee  Stiffness of right knee, not elsewhere classified  Localized edema  Muscle weakness (generalized)  Difficulty in walking, not elsewhere classified  Rationale for Evaluation and Treatment: Rehabilitation  ONSET DATE: 04/22/2024  SUBJECTIVE:   SUBJECTIVE STATEMENT: Brooke Cantrell has a manipulation scheduled for Thursday October 9.  PERTINENT HISTORY: Lt breast cancer, kidney stones, HTN, Lt TSA 2019  PAIN:  Are you having pain? Yes: NPRS scale: at rest during day 1-2/10, at night feels it more,  with activity & exercise up to 4-5/10 this week Pain location: Rt knee Pain description: Stiff, achy, occasional sharp Aggravating factors: Prolonged postures and too much WB Relieving factors: Pain meds, ice, change positions frequently  PRECAUTIONS: None  RED FLAGS:  None   WEIGHT BEARING RESTRICTIONS: No  FALLS:  Has patient fallen in last 6 months? No  LIVING ENVIRONMENT: Lives with: lives with their family and grandson Lives in: House/apartment Stairs: Uses the rail with steps Has following equipment at home: Single point cane and Environmental consultant - 2 wheeled  OCCUPATION: Retired  PLOF: Independent  PATIENT GOALS: Return to gardening her acre lot, weed, plant in the garden    OBJECTIVE:  Note: Objective measures were completed at Evaluation unless otherwise noted.  DIAGNOSTIC FINDINGS: Right knee arthroplasty in expected  alignment. No periprosthetic lucency or fracture. There has been patellar resurfacing. Recent postsurgical change includes air and edema in the soft tissues and joint space. Anterior skin staples in place.  PATIENT SURVEYS:  PSFS: THE PATIENT SPECIFIC FUNCTIONAL SCALE  Place score of 0-10 (0 = unable to perform activity and 10 = able to perform activity at the same level as before injury or problem)  Activity Date:  05/09/2024 06/02/2024   Walk 2 5   2.  Garden 0 0   3.  Safely transfer to and from the ground 0 0   4.      Total Score 0.67 1.67     Total Score = Sum of activity scores/number of activities  Minimally Detectable Change: 3 points (for single activity); 2 points (for average score)  [facility] (nd). The Patient Specific Functional Scale . Retrieved from [email or web address]   COGNITION: Overall cognitive status: Within functional limits for tasks assessed     SENSATION: WFL  EDEMA:  Noted and not objectively assessed  LOWER EXTREMITY ROM:   ROM Left/Right 05/09/2024 Rt 05/13/24 Right 05/20/24 Right 05/21/24 Right 05/26/24 Right 05/28/24 Rt 06/02/2024 Right 06/04/24 Right 06/06/2024 Rt 06/09/2024 Right 06/13/2024  Knee flexion 145/52 Supine:  A:  P:  Sitting:  P: 70 Supine AA: 76* Seated P: 78* Standing  P: flexion stretch 82* Seated P: 87* Supine  A: 76deg A: 89  P: 95 Active 93 AROM: 87 PROM: 91 Active 97  Knee extension 0/7     Seated P: -12*  LAQ Supine:  A: 6deg of flexion A: -11 (seated LAQ) Active lacks 5 AROM: 10deg flexion PROM: 8deg flexion  Active 5   (Blank rows = not tested)  LOWER EXTREMITY STRENGTH:  In pounds with hand-held dynamometer Left/Right 05/09/2024 Right 06/04/24  Knee flexion    Knee extension 49.7/5.9 pounds 27.8#   (Blank rows = not tested)  GAIT: Distance walked: 50 feet Assistive device utilized: Environmental consultant - 2 wheeled Level of assistance: Modified  independence Comments: Brooke Cantrell is using her walker full-time at this point   TREATMENT DATE:  06/17/2024 Therapeutic Exercise: precor bike seat 6 flexion stretch for 5 min, backwards full revolutions 3 min, forward full revolutions 2 min Gastroc stretch step heel depression 30 sec 3 reps and 10 reps heel raises bw each rep. Hamstring stretch RLE long sit strap DF 30 sec 3 reps Quad stretch hooklying RLE over edge strap flex 30 sec 3 reps Supine gravity assisted (femur vertical) flexion 5 sec 10 reps  Bridge 10 reps with RLE foot moving closer on rep 3, 6, 9 with 5 sec hold flexion stretch and lift. Quad set on towel roll 5 sec hold 15 reps PT reviewed need to exercise frequently (at least 5 min of each awake hour).  Exercises should include flexibility, strength, balance and muscle endurance.  Walking out 3 min & back 3 min. Then progress 1 min each as tolerated.  Pt verbalized understanding.  PT updated HEP with HO and reviewed with demo & verbal cues.  Pt verbalized understanding.   Neuromuscular Re-education Tandem stance 1st rep of each set RLE in front & 2nd rep in back;  1st set floor eyes open, 2nd set floor eyes closed intermittent touch, 3rd set foam eyes open intermittent touch  Vaso RLE knee elevation medium compression 34*  10 min with ext stretch   06/13/2024 Quad sets with right heel prop 2 sets of 10 for 5 seconds Seated knee flexion AAROM (left pushes right into flexion) 10 x 10 seconds Recumbent bike Seat 6 for 8 minutes (AAROM to 10 full revolutions in each direction)  Manual: Knee flexion with PT assist 25 x 10 seconds  Vaso Right Knee 10 minutes High 34*   06/11/2024 TherAct:   UBE with bilat UE and LE , level 1 for 10 minutes full revolutions  Bilat LE press 1x15 with 75#, 2x15 with 93#  Rt Le leg press 3x12 with 43#  Step downs with 4 step 1x10, with 6 step 1x10 with bilat UE  Patient performed reciprocal pattern on full set of stairs with bilat UE use on  handrails with CGA throughout  Intermittent circumduction with Rt LE and highly fearful of stairs  Improved performance with ascending vs descending  PT discussed manipulation procedure and pain levels following, what to expect with POC following procedure, fear/anxiety with stairs, and potential requirement for increased balance activities     PATIENT EDUCATION:  Education details: See above Person educated: Patient Education method: Explanation, Demonstration, Tactile cues, Verbal cues, and Handouts Education comprehension: verbalized understanding, returned demonstration, verbal cues required, tactile cues required, and needs further education  HOME EXERCISE PROGRAM: Access Code: [email or web address] URL: [email or web address] Access Code: [email or web address] URL: [email or web address] Date: 06/16/2024 Prepared by: Grayce Spatz  Exercises - Seated Knee Flexion AAROM  - 5 x daily - 7 x weekly - 1 sets - 10 reps - 10 seconds hold - Supine Quadricep Sets  - 10 x daily - 7 x weekly - 10 sets - 10 reps - 5 second hold - Seated Knee Flexion Extension AROM   - 2-4 x daily - 7 x weekly - 2-3 sets - 10 reps - 5 seconds hold - Seated Knee Flexion Extension AAROM with Overpressure  - 2-4 x daily - 7 x weekly - 2-3 sets - 10 reps - 5 seconds hold - Seated Long Arc Quad  - 2-4 x daily - 7 x weekly - 2-3 sets - 10 reps - 5 seconds hold - Supine Heel Slide with Strap  - 2-4 x daily - 7 x weekly - 2-3 sets - 10 reps - 5 seconds hold - Standing Knee Flexion Stretch on Step  - 2-3 x daily - 7 x weekly - 3-5 sets - 3 reps - 10 seconds hold - Gastroc Stretch on Step  - 1-3 x daily - 7 x weekly - 1 sets - 3 reps - 30 seconds hold - Standing Heel Raise  - 1-2 x daily - 7 x weekly - 2-3 sets - 10 reps - 2-3 seconds hold - Tandem Stance  - 1 x daily - 7 x weekly - 3 sets - 2 reps - 30 seconds hold - Seated Table Hamstring Stretch  - 1-3 x daily - 7 x weekly - 1 sets - 3 reps - 30 seconds hold -  Seated Hamstring Stretch with Strap  - 1-2 x daily - 7 x weekly - 1  sets - 3 reps - 30 seconds hold - Seated Passive Knee Extension  - 2 x daily - 7 x weekly - 3-5 minutes hold - Prone Knee Extension with Ankle Weight  - 2 x daily - 7 x weekly - 3-5 minute hold - Supine Quadriceps Stretch with Strap on Table  - 1-3 x daily - 7 x weekly - 1 sets - 3 reps - 30 seconds hold  ASSESSMENT:  CLINICAL IMPRESSION: She will need PT daily basis for the 2 weeks following her manipulation.  She appears to understand updated HEP.    OBJECTIVE IMPAIRMENTS: Abnormal gait, decreased activity tolerance, decreased endurance, decreased knowledge of condition, difficulty walking, decreased ROM, decreased strength, increased edema, and pain.   ACTIVITY LIMITATIONS: bending, sitting, standing, squatting, sleeping, stairs, transfers, bed mobility, dressing, and locomotion level  PARTICIPATION LIMITATIONS: community activity and yard work  PERSONAL FACTORS: Lt breast cancer, kidney stones, HTN, Lt TSA 2019 are also affecting patient's functional outcome.   REHAB POTENTIAL: Good  CLINICAL DECISION MAKING: Stable/uncomplicated  EVALUATION COMPLEXITY: Low   GOALS: Goals reviewed with patient? Yes  SHORT TERM GOALS: Target date: 06/20/2024  Brooke Cantrell will be independent with her day 1 home exercise program Baseline: Started 05/09/2024 Goal status:   Met 06/06/2024  2.  Improve right knee active range of motion to 3 - 0 -95 degrees Baseline: 7 - 0 - 52 degrees Goal status: Partially Met 06/13/2024  3.  Improve right quadriceps strength to at least 30 pounds Baseline: 5.9 pounds Goal status: ONGOING      06/16/2024   LONG TERM GOALS: Target date: 08/01/2024  Improve patient specific functional score to at least 5 Baseline: 0.67 Goal status: Ongoing    06/16/2024  2.  Brooke Cantrell will report right knee pain consistently 3/10 or better on the numeric pain rating scale Baseline: Can be as high as 8/10 Goal status:  ONGOING 1   06/16/2024  3.  Improve right knee AROM to 2 - 0 -110 degrees Baseline: 7 - 0 - 52 degrees Goal status: ONGOING    06/16/2024  4.  Improve bilateral quadriceps strength to 60 pounds or better Baseline: See objective Goal status: Ongoing      06/16/2024  5.  Brooke Cantrell will be comfortable without the use of an assistive device within the house and with no more than a single-point cane outside the house at transfer into independent rehabilitation Baseline: 2 wheeled walker full-time Goal status:  Met 06/13/2024  6.  We will be independent with a long-term home maintenance exercise program at discharge Baseline: Started 05/09/2024 Goal status: ONGOING   06/16/2024   PLAN:  PT FREQUENCY: 2-3 times a week  PT DURATION: 12 weeks  PLANNED INTERVENTIONS: 97750- Physical Performance Testing, 97110-Therapeutic exercises, 97530- Therapeutic activity, 97112- Neuromuscular re-education, 97535- Self Care, 02859- Manual therapy, [phone]- Gait training, [phone]- Electrical stimulation (unattended), 97016- Vasopneumatic device, Patient/Family education, Balance training, Stair training, Joint mobilization, and Cryotherapy  PLAN FOR NEXT SESSION:  recertification after Manipulation under anesthesia,  Continue with therapeutic exercise and activities to progress range and functional strength, manual therapy, vaso for edema, balance, ambulation on ramps and stairs (highly fearful of stairs).    Grayce Spatz, PT, DPT 06/16/2024, 9:52 AM

## 2024-06-17 ENCOUNTER — Encounter

## 2024-06-18 ENCOUNTER — Other Ambulatory Visit: Payer: Self-pay | Admitting: Physician Assistant

## 2024-06-18 ENCOUNTER — Encounter: Admitting: Physical Therapy

## 2024-06-18 ENCOUNTER — Encounter (HOSPITAL_COMMUNITY): Payer: Self-pay | Admitting: Orthopaedic Surgery

## 2024-06-18 ENCOUNTER — Other Ambulatory Visit: Payer: Self-pay

## 2024-06-18 DIAGNOSIS — M24661 Ankylosis, right knee: Secondary | ICD-10-CM | POA: Insufficient documentation

## 2024-06-18 NOTE — Progress Notes (Signed)
 PCP - Dr Alda Carpen Cardiologist - none Endocrinology - Dr Therisa Leep  Chest x-ray - n/a EKG - 04/18/24 Stress Test - n/a ECHO - 09/28/05 Cardiac Cath - n/a  ICD Pacemaker/Loop - n/a  Sleep Study -  n/a  Diabetes- n/a  Aspirin  & Blood Thinner Instructions:  n/a  ERAS - clear liquids til 0815 DOS.  Anesthesia review: Yes  STOP now taking any Aspirin  (unless otherwise instructed by your surgeon), Aleve, Naproxen, Ibuprofen, Motrin, Advil, Goody's, BC's, all herbal medications, fish oil, and all vitamins.   Coronavirus Screening Do you have any of the following symptoms:  Cough yes/no: No Fever (>100.15F)  yes/no: No Runny nose yes/no: No Sore throat yes/no: No Difficulty breathing/shortness of breath  yes/no: No  Have you traveled in the last 14 days and where? yes/no: No  Patient verbalized understanding of instructions that were given via phone.

## 2024-06-18 NOTE — H&P (Signed)
 Brooke Cantrell is an 78 y.o. female.   Chief Complaint: Right knee stiffness and decreased motion HPI: The patient is an active 78 year old female who underwent a right total knee arthroplasty on August 12 of this year secondary to significant knee arthritis and pain.  She has been pushing hard to physical therapy but still has decreased motion of her right knee.  At this point we are recommending a manipulation under anesthesia in order to free up scar tissue and get her knee moving better.  Past Medical History:  Diagnosis Date   Aortic atherosclerosis 05/2024   Arthritis    Breast cancer (HCC)    Left breast   Depression    mild, no meds   History of kidney stones    passed the stone   HLD (hyperlipidemia)    Hypertension    Osteopenia 05/2024    Past Surgical History:  Procedure Laterality Date   breast cancer surgery     BREAST ENHANCEMENT SURGERY     BREAST LUMPECTOMY Left    CATARACT EXTRACTION, BILATERAL     ECTOPIC PREGNANCY SURGERY     EYE SURGERY     INNER EAR SURGERY     TONSILLECTOMY     TOTAL KNEE ARTHROPLASTY Right 04/22/2024   Procedure: ARTHROPLASTY, KNEE, TOTAL;  Surgeon: Vernetta Lonni GRADE, MD;  Location: [facility] OR;  Service: Orthopedics;  Laterality: Right;   TOTAL SHOULDER ARTHROPLASTY Left 04/25/2018   TOTAL SHOULDER ARTHROPLASTY Left 04/25/2018   Procedure: LEFT TOTAL SHOULDER REPLACEMENT;  Surgeon: Addie Cordella Hamilton, MD;  Location: [facility] OR;  Service: Orthopedics;  Laterality: Left;   TUBAL LIGATION      Family History  Problem Relation Age of Onset   Allergic rhinitis Father    Allergic rhinitis Sister    Allergic rhinitis Brother    Breast cancer Neg Hx    Social History:  reports that she quit smoking about 27 years ago. Her smoking use included cigarettes. She has never used smokeless tobacco. She reports current alcohol use of about 5.0 - 7.0 standard drinks of alcohol per week. She reports that she does not use drugs.  Allergies:  Allergies   Allergen Reactions   Ciprofloxacin Itching, Rash and Hives   Oxycodone  Rash and Other (See Comments)    Itching, dry skin     No medications prior to admission.    No results found for this or any previous visit (from the past 48 hours). No results found.  Review of Systems  Height 5' 4 (1.626 m), weight 63.5 kg. Physical Exam Vitals reviewed.  Constitutional:      Appearance: Normal appearance. She is normal weight.  HENT:     Head: Normocephalic and atraumatic.  Eyes:     Extraocular Movements: Extraocular movements intact.     Pupils: Pupils are equal, round, and reactive to light.  Cardiovascular:     Rate and Rhythm: Normal rate.  Pulmonary:     Effort: Pulmonary effort is normal.     Breath sounds: Normal breath sounds.  Abdominal:     Palpations: Abdomen is soft.  Musculoskeletal:     Cervical back: Normal range of motion and neck supple.     Right knee: Decreased range of motion.  Neurological:     Mental Status: She is alert and oriented to person, place, and time.  Psychiatric:        Behavior: Behavior normal.      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  This has been described to her in detail and the risks and benefits of surgery been discussed.  The plan is to manipulate the knee and then resume aggressive outpatient physical therapy for that right knee.  [name], MD 06/18/2024, 6:19 PM

## 2024-06-18 NOTE — Therapy (Signed)
 OUTPATIENT PHYSICAL THERAPY TREATMENT NOTE / RECERT / RE-EVAL   Patient Name: Brooke Cantrell MRN: [id number] DOB:1945/11/05, 78 y.o., female Today's Date: 06/18/2024  END OF SESSION:        Past Medical History:  Diagnosis Date   Aortic atherosclerosis 05/2024   Arthritis    Breast cancer (HCC)    Left breast   Depression    mild, no meds   History of kidney stones    passed the stone   HLD (hyperlipidemia)    Hypertension    Osteopenia 05/2024   Past Surgical History:  Procedure Laterality Date   breast cancer surgery     BREAST ENHANCEMENT SURGERY     BREAST LUMPECTOMY Left    CATARACT EXTRACTION, BILATERAL     ECTOPIC PREGNANCY SURGERY     EYE SURGERY     INNER EAR SURGERY     TONSILLECTOMY     TOTAL KNEE ARTHROPLASTY Right 04/22/2024   Procedure: ARTHROPLASTY, KNEE, TOTAL;  Surgeon: Cantrell Brooke GRADE, MD;  Location: [facility] OR;  Service: Orthopedics;  Laterality: Right;   TOTAL SHOULDER ARTHROPLASTY Left 04/25/2018   TOTAL SHOULDER ARTHROPLASTY Left 04/25/2018   Procedure: LEFT TOTAL SHOULDER REPLACEMENT;  Surgeon: Brooke Cordella Hamilton, MD;  Location: [facility] OR;  Service: Orthopedics;  Laterality: Left;   TUBAL LIGATION     Patient Active Problem List   Diagnosis Date Noted   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   Weakness    Hyperlipidemia    Acute respiratory failure due to COVID-19 (HCC) 06/10/2020   Pneumonia due to COVID-19 virus 06/10/2020   Hx of multiple pulmonary nodules 06/10/2020   Acute respiratory failure with hypoxia (HCC)    Lung nodule seen on imaging study 01/08/2020   History of breast cancer 04/14/2019   Sepsis (HCC) 04/02/2019   CAP (community acquired pneumonia) 04/02/2019   Elevated troponin 04/02/2019   Shoulder arthritis 04/25/2018   Primary osteoarthritis, 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   Chronic left shoulder pain 05/17/2017   Cervical disc disorder with radiculopathy 08/14/2016   Chronic infection of sinus 08/31/2015    Cough 08/31/2015   Allergic reaction 08/31/2015    PCP: Brooke Carpen, MD  REFERRING PROVIDER: Lonni Cantrell Vernetta, MD  REFERRING DIAG: [phone] (ICD-10-CM) - Status post total right knee replacement  THERAPY DIAG:  No diagnosis found.  Rationale for Evaluation and Treatment: Rehabilitation  ONSET DATE: 04/22/2024  SUBJECTIVE:   SUBJECTIVE STATEMENT: Brooke Cantrell has a manipulation scheduled for Thursday October 9.  PERTINENT HISTORY: Lt breast cancer, kidney stones, HTN, Lt TSA 2019  PAIN:  Are you having pain? Yes: NPRS scale: at rest during day 1-2/10, at night feels it more,  with activity & exercise up to 4-5/10 this week Pain location: Rt knee Pain description: Stiff, achy, occasional sharp Aggravating factors: Prolonged postures and too much WB Relieving factors: Pain meds, ice, change positions frequently  PRECAUTIONS: None  RED FLAGS: None   WEIGHT BEARING RESTRICTIONS: No  FALLS:  Has patient fallen in last 6 months? No  LIVING ENVIRONMENT: Lives with: lives with their family and grandson Lives in: House/apartment Stairs: Uses the rail with steps Has following equipment at home: Single point cane and Environmental consultant - 2 wheeled  OCCUPATION: Retired  PLOF: Independent  PATIENT GOALS: Return to gardening her acre lot, weed, plant in the garden    OBJECTIVE:  Note: Objective measures were completed at Evaluation unless otherwise noted.  DIAGNOSTIC FINDINGS: Right knee arthroplasty in expected alignment. No periprosthetic lucency or  fracture. There has been patellar resurfacing. Recent postsurgical change includes air and edema in the soft tissues and joint space. Anterior skin staples in place.  PATIENT SURVEYS:  PSFS: THE PATIENT SPECIFIC FUNCTIONAL SCALE  Place score of 0-10 (0 = unable to perform activity and 10 = able to perform activity at the same level as before injury or problem)  Activity Date:  05/09/2024 06/02/2024   Walk 2 5   2.  Garden  0 0   3.  Safely transfer to and from the ground 0 0   4.      Total Score 0.67 1.67     Total Score = Sum of activity scores/number of activities  Minimally Detectable Change: 3 points (for single activity); 2 points (for average score)  [facility] (nd). The Patient Specific Functional Scale . Retrieved from [email or web address]   COGNITION: Overall cognitive status: Within functional limits for tasks assessed     SENSATION: WFL  EDEMA:  Noted and not objectively assessed  LOWER EXTREMITY ROM:   ROM Left/Right 05/09/2024 Rt 05/13/24 Right 05/20/24 Right 05/21/24 Right 05/26/24 Right 05/28/24 Rt 06/02/2024 Right 06/04/24 Right 06/06/2024 Rt 06/09/2024 Right 06/13/2024  Knee flexion 145/52 Supine:  A:  P:  Sitting:  P: 70 Supine AA: 76* Seated P: 78* Standing  P: flexion stretch 82* Seated P: 87* Supine  A: 76deg A: 89  P: 95 Active 93 AROM: 87 PROM: 91 Active 97  Knee extension 0/7     Seated P: -12*  LAQ Supine:  A: 6deg of flexion A: -11 (seated LAQ) Active lacks 5 AROM: 10deg flexion PROM: 8deg flexion  Active 5   (Blank rows = not tested)  LOWER EXTREMITY STRENGTH:  In pounds with hand-held dynamometer Left/Right 05/09/2024 Right 06/04/24  Knee flexion    Knee extension 49.7/5.9 pounds 27.8#   (Blank rows = not tested)  GAIT: Distance walked: 50 feet Assistive device utilized: Environmental consultant - 2 wheeled Level of assistance: Modified independence Comments: Brooke Cantrell is using her walker full-time at this point   TREATMENT DATE:  06/19/2024 ***  06/17/2024 Therapeutic Exercise: precor bike seat 6 flexion stretch for 5 min, backwards full revolutions 3 min, forward full revolutions 2 min Gastroc stretch step heel depression 30 sec 3 reps and 10 reps heel raises bw each rep. Hamstring stretch RLE long sit strap DF 30 sec 3 reps Quad stretch hooklying RLE over edge strap flex 30 sec 3 reps Supine gravity  assisted (femur vertical) flexion 5 sec 10 reps  Bridge 10 reps with RLE foot moving closer on rep 3, 6, 9 with 5 sec hold flexion stretch and lift. Quad set on towel roll 5 sec hold 15 reps PT reviewed need to exercise frequently (at least 5 min of each awake hour).  Exercises should include flexibility, strength, balance and muscle endurance.  Walking out 3 min & back 3 min. Then progress 1 min each as tolerated. Pt verbalized understanding.  PT updated HEP with HO and reviewed with demo & verbal cues.  Pt verbalized understanding.   Neuromuscular Re-education Tandem stance 1st rep of each set RLE in front & 2nd rep in back;  1st set floor eyes open, 2nd set floor eyes closed intermittent touch, 3rd set foam eyes open intermittent touch  Vaso RLE knee elevation medium compression 34*  10 min with ext stretch   06/13/2024 Quad sets with right heel prop 2 sets of 10 for 5 seconds Seated knee flexion AAROM (left pushes right  into flexion) 10 x 10 seconds Recumbent bike Seat 6 for 8 minutes (AAROM to 10 full revolutions in each direction)  Manual: Knee flexion with PT assist 25 x 10 seconds  Vaso Right Knee 10 minutes High 34*   06/11/2024 TherAct:   UBE with bilat UE and LE , level 1 for 10 minutes full revolutions  Bilat LE press 1x15 with 75#, 2x15 with 93#  Rt Le leg press 3x12 with 43#  Step downs with 4 step 1x10, with 6 step 1x10 with bilat UE  Patient performed reciprocal pattern on full set of stairs with bilat UE use on handrails with CGA throughout  Intermittent circumduction with Rt LE and highly fearful of stairs  Improved performance with ascending vs descending  PT discussed manipulation procedure and pain levels following, what to expect with POC following procedure, fear/anxiety with stairs, and potential requirement for increased balance activities     PATIENT EDUCATION:  Education details: See above Person educated: Patient Education method: Explanation,  Demonstration, Tactile cues, Verbal cues, and Handouts Education comprehension: verbalized understanding, returned demonstration, verbal cues required, tactile cues required, and needs further education  HOME EXERCISE PROGRAM: Access Code: [email or web address] URL: [email or web address] Access Code: [email or web address] URL: [email or web address] Date: 06/16/2024 Prepared by: Grayce Spatz  Exercises - Seated Knee Flexion AAROM  - 5 x daily - 7 x weekly - 1 sets - 10 reps - 10 seconds hold - Supine Quadricep Sets  - 10 x daily - 7 x weekly - 10 sets - 10 reps - 5 second hold - Seated Knee Flexion Extension AROM   - 2-4 x daily - 7 x weekly - 2-3 sets - 10 reps - 5 seconds hold - Seated Knee Flexion Extension AAROM with Overpressure  - 2-4 x daily - 7 x weekly - 2-3 sets - 10 reps - 5 seconds hold - Seated Long Arc Quad  - 2-4 x daily - 7 x weekly - 2-3 sets - 10 reps - 5 seconds hold - Supine Heel Slide with Strap  - 2-4 x daily - 7 x weekly - 2-3 sets - 10 reps - 5 seconds hold - Standing Knee Flexion Stretch on Step  - 2-3 x daily - 7 x weekly - 3-5 sets - 3 reps - 10 seconds hold - Gastroc Stretch on Step  - 1-3 x daily - 7 x weekly - 1 sets - 3 reps - 30 seconds hold - Standing Heel Raise  - 1-2 x daily - 7 x weekly - 2-3 sets - 10 reps - 2-3 seconds hold - Tandem Stance  - 1 x daily - 7 x weekly - 3 sets - 2 reps - 30 seconds hold - Seated Table Hamstring Stretch  - 1-3 x daily - 7 x weekly - 1 sets - 3 reps - 30 seconds hold - Seated Hamstring Stretch with Strap  - 1-2 x daily - 7 x weekly - 1 sets - 3 reps - 30 seconds hold - Seated Passive Knee Extension  - 2 x daily - 7 x weekly - 3-5 minutes hold - Prone Knee Extension with Ankle Weight  - 2 x daily - 7 x weekly - 3-5 minute hold - Supine Quadriceps Stretch with Strap on Table  - 1-3 x daily - 7 x weekly - 1 sets - 3 reps - 30 seconds hold  ASSESSMENT:  CLINICAL IMPRESSION: *** She will need PT daily basis for the 2 weeks  following her manipulation.  She appears to  understand updated HEP.    OBJECTIVE IMPAIRMENTS: Abnormal gait, decreased activity tolerance, decreased endurance, decreased knowledge of condition, difficulty walking, decreased ROM, decreased strength, increased edema, and pain.   ACTIVITY LIMITATIONS: bending, sitting, standing, squatting, sleeping, stairs, transfers, bed mobility, dressing, and locomotion level  PARTICIPATION LIMITATIONS: community activity and yard work  PERSONAL FACTORS: Lt breast cancer, kidney stones, HTN, Lt TSA 2019 are also affecting patient's functional outcome.   REHAB POTENTIAL: Good  CLINICAL DECISION MAKING: Stable/uncomplicated  EVALUATION COMPLEXITY: Low   GOALS: Goals reviewed with patient? Yes  SHORT TERM GOALS: Target date: 06/20/2024  Juelle will be independent with her day 1 home exercise program Baseline: Started 05/09/2024 Goal status:   Met 06/06/2024  2.  Improve right knee active range of motion to 3 - 0 -95 degrees Baseline: 7 - 0 - 52 degrees Goal status: Partially Met 06/13/2024  3.  Improve right quadriceps strength to at least 30 pounds Baseline: 5.9 pounds Goal status: ONGOING      06/16/2024   LONG TERM GOALS: Target date: 08/01/2024  Improve patient specific functional score to at least 5 Baseline: 0.67 Goal status: Ongoing    06/16/2024  2.  Eileen will report right knee pain consistently 3/10 or better on the numeric pain rating scale Baseline: Can be as high as 8/10 Goal status: ONGOING 1   06/16/2024  3.  Improve right knee AROM to 2 - 0 -110 degrees Baseline: 7 - 0 - 52 degrees Goal status: ONGOING    06/16/2024  4.  Improve bilateral quadriceps strength to 60 pounds or better Baseline: See objective Goal status: Ongoing      06/16/2024  5.  Shavonna will be comfortable without the use of an assistive device within the house and with no more than a single-point cane outside the house at transfer into independent  rehabilitation Baseline: 2 wheeled walker full-time Goal status:  Met 06/13/2024  6.  We will be independent with a long-term home maintenance exercise program at discharge Baseline: Started 05/09/2024 Goal status: ONGOING   06/16/2024   PLAN:  PT FREQUENCY: 2-3 times a week  PT DURATION: 12 weeks  PLANNED INTERVENTIONS: 97750- Physical Performance Testing, 97110-Therapeutic exercises, 97530- Therapeutic activity, 97112- Neuromuscular re-education, 97535- Self Care, 02859- Manual therapy, [phone]- Gait training, [phone]- Electrical stimulation (unattended), [id number]- Vasopneumatic device, Patient/Family education, Balance training, Stair training, Joint mobilization, and Cryotherapy  PLAN FOR NEXT SESSION:  *** recertification after Manipulation under anesthesia,  Continue with therapeutic exercise and activities to progress range and functional strength, manual therapy, vaso for edema, balance, ambulation on ramps and stairs (highly fearful of stairs).   Susannah Daring, PT, DPT 06/18/24 4:12 PM

## 2024-06-19 ENCOUNTER — Ambulatory Visit (HOSPITAL_COMMUNITY): Admitting: Anesthesiology

## 2024-06-19 ENCOUNTER — Ambulatory Visit (HOSPITAL_COMMUNITY)
Admission: RE | Admit: 2024-06-19 | Discharge: 2024-06-19 | Disposition: A | Discharge status: Home / Self Care | Attending: Orthopaedic Surgery | Admitting: Orthopaedic Surgery

## 2024-06-19 ENCOUNTER — Ambulatory Visit

## 2024-06-19 ENCOUNTER — Encounter: Admitting: Physical Therapy

## 2024-06-19 ENCOUNTER — Encounter: Admitting: Rehabilitative and Restorative Service Providers"

## 2024-06-19 ENCOUNTER — Other Ambulatory Visit: Payer: Self-pay

## 2024-06-19 ENCOUNTER — Encounter (HOSPITAL_COMMUNITY): Payer: Self-pay | Admitting: Orthopaedic Surgery

## 2024-06-19 ENCOUNTER — Encounter (HOSPITAL_COMMUNITY)
Admission: RE | Disposition: A | Discharge status: Home / Self Care | Payer: Self-pay | Source: Home / Self Care | Attending: Orthopaedic Surgery

## 2024-06-19 DIAGNOSIS — Z87891 Personal history of nicotine dependence: Secondary | ICD-10-CM

## 2024-06-19 DIAGNOSIS — T8482XA Fibrosis due to internal orthopedic prosthetic devices, implants and grafts, initial encounter: Secondary | ICD-10-CM | POA: Insufficient documentation

## 2024-06-19 DIAGNOSIS — R600 Localized edema: Secondary | ICD-10-CM | POA: Diagnosis not present

## 2024-06-19 DIAGNOSIS — I10 Essential (primary) hypertension: Secondary | ICD-10-CM | POA: Diagnosis not present

## 2024-06-19 DIAGNOSIS — M6281 Muscle weakness (generalized): Secondary | ICD-10-CM

## 2024-06-19 DIAGNOSIS — M25561 Pain in right knee: Secondary | ICD-10-CM | POA: Diagnosis not present

## 2024-06-19 DIAGNOSIS — M25661 Stiffness of right knee, not elsewhere classified: Secondary | ICD-10-CM | POA: Diagnosis not present

## 2024-06-19 DIAGNOSIS — R262 Difficulty in walking, not elsewhere classified: Secondary | ICD-10-CM

## 2024-06-19 DIAGNOSIS — M24661 Ankylosis, right knee: Secondary | ICD-10-CM | POA: Insufficient documentation

## 2024-06-19 DIAGNOSIS — J9601 Acute respiratory failure with hypoxia: Secondary | ICD-10-CM

## 2024-06-19 DIAGNOSIS — Z96651 Presence of right artificial knee joint: Secondary | ICD-10-CM

## 2024-06-19 DIAGNOSIS — Y838 Other surgical procedures as the cause of abnormal reaction of the patient, or of later complication, without mention of misadventure at the time of the procedure: Secondary | ICD-10-CM | POA: Diagnosis not present

## 2024-06-19 HISTORY — PX: EXAM UNDER ANESTHESIA WITH MANIPULATION OF KNEE: SHX5816

## 2024-06-19 HISTORY — DX: Hyperlipidemia, unspecified: E78.5

## 2024-06-19 SURGERY — MANIPULATION, JOINT, KNEE, WITH ANESTHESIA
Anesthesia: Monitor Anesthesia Care | Site: Knee | Laterality: Right

## 2024-06-19 MED ORDER — FENTANYL CITRATE (PF) 100 MCG/2ML IJ SOLN: 50.0000 ug | Freq: Once | INTRAMUSCULAR | Status: AC

## 2024-06-19 MED ORDER — TRAMADOL HCL 50 MG PO TABS
50.0000 mg | ORAL_TABLET | Freq: Once | ORAL | Status: AC
  Administered 2024-06-19: 50 mg via ORAL

## 2024-06-19 MED ORDER — BUPIVACAINE HCL (PF) 0.25 % IJ SOLN
INTRAMUSCULAR | Status: AC
  Filled 2024-06-19: qty 10

## 2024-06-19 MED ORDER — METHYLPREDNISOLONE SODIUM SUCC 40 MG IJ SOLR
INTRAMUSCULAR | Status: AC
  Filled 2024-06-19: qty 1

## 2024-06-19 MED ORDER — ONDANSETRON HCL 4 MG/2ML IJ SOLN: 4.0000 mg | Freq: Once | INTRAMUSCULAR | Status: DC | PRN

## 2024-06-19 MED ORDER — PROPOFOL 10 MG/ML IV BOLUS
INTRAVENOUS | Status: DC | PRN
  Administered 2024-06-19: 70 mg via INTRAVENOUS

## 2024-06-19 MED ORDER — METHYLPREDNISOLONE ACETATE 40 MG/ML IJ SUSP
INTRAMUSCULAR | Status: DC | PRN
  Administered 2024-06-19: 5 mL via INTRA_ARTICULAR

## 2024-06-19 MED ORDER — METHYLPREDNISOLONE SODIUM SUCC 125 MG IJ SOLR
INTRAMUSCULAR | Status: DC | PRN
Start: 2024-06-19 — End: 2024-06-19

## 2024-06-19 MED ORDER — TRAMADOL HCL 50 MG PO TABS
ORAL_TABLET | ORAL | Status: AC
  Filled 2024-06-19: qty 1

## 2024-06-19 MED ORDER — TRAMADOL HCL 50 MG PO TABS: 50.0000 mg | ORAL_TABLET | Freq: Four times a day (QID) | ORAL | 0 refills | Status: DC | PRN

## 2024-06-19 MED ORDER — ROPIVACAINE HCL 7.5 MG/ML IJ SOLN
INTRAMUSCULAR | Status: DC | PRN
  Administered 2024-06-19: 20 mL via PERINEURAL

## 2024-06-19 MED ORDER — CHLORHEXIDINE GLUCONATE 0.12 % MT SOLN
15.0000 mL | Freq: Once | OROMUCOSAL | Status: AC
  Administered 2024-06-19: 15 mL via OROMUCOSAL
  Filled 2024-06-19: qty 15

## 2024-06-19 MED ORDER — LIDOCAINE 2% (20 MG/ML) 5 ML SYRINGE
INTRAMUSCULAR | Status: DC | PRN
  Administered 2024-06-19: 40 mg via INTRAVENOUS

## 2024-06-19 MED ORDER — LACTATED RINGERS IV SOLN: INTRAVENOUS | Status: DC

## 2024-06-19 MED ORDER — FENTANYL CITRATE (PF) 100 MCG/2ML IJ SOLN: 25.0000 ug | INTRAMUSCULAR | Status: DC | PRN

## 2024-06-19 MED ORDER — PROPOFOL 10 MG/ML IV BOLUS
INTRAVENOUS | Status: AC
  Filled 2024-06-19: qty 20

## 2024-06-19 MED ORDER — FENTANYL CITRATE (PF) 100 MCG/2ML IJ SOLN
INTRAMUSCULAR | Status: AC
  Administered 2024-06-19: 50 ug via INTRAVENOUS
  Filled 2024-06-19: qty 2

## 2024-06-19 MED ORDER — ACETAMINOPHEN 500 MG PO TABS
1000.0000 mg | ORAL_TABLET | Freq: Once | ORAL | Status: AC
  Administered 2024-06-19: 1000 mg via ORAL
  Filled 2024-06-19: qty 2

## 2024-06-19 MED ORDER — FENTANYL CITRATE (PF) 100 MCG/2ML IJ SOLN
INTRAMUSCULAR | Status: DC | PRN
  Administered 2024-06-19: 50 ug via INTRAVENOUS

## 2024-06-19 MED ORDER — ORAL CARE MOUTH RINSE: 15.0000 mL | Freq: Once | OROMUCOSAL | Status: AC

## 2024-06-19 MED ORDER — BUPIVACAINE HCL 0.25 % IJ SOLN
INTRAMUSCULAR | Status: DC | PRN
Start: 2024-06-19 — End: 2024-06-19

## 2024-06-19 MED ORDER — DEXAMETHASONE SODIUM PHOSPHATE 10 MG/ML IJ SOLN
INTRAMUSCULAR | Status: DC | PRN
  Administered 2024-06-19: 5 mg via PERINEURAL

## 2024-06-19 MED ORDER — FENTANYL CITRATE (PF) 250 MCG/5ML IJ SOLN
INTRAMUSCULAR | Status: AC
  Filled 2024-06-19: qty 5

## 2024-06-19 NOTE — Op Note (Signed)
 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 Vernetta, MD  Anesthesia: #1 IV sedation, #2 right lower extremity adductor canal block, #3 local Complications: None  Indications: The patient is a 78 year old female who has a history of a right total knee arthroplasty done not too long ago.  She has posterior self Ortho physical therapy but we have barely been able to flex her past 90 degrees.  At this point we recommended a manipulation under anesthesia given her decreased motion of that knee but she is someone who is incredibly active and a manipulation under anesthesia is recommended for that knee.  She does have physical therapy this afternoon.  Procedure description after informed consent was then appropriate right knee was marked, an adductor canal block was obtained in the holding room by anesthesia of the right lower extremity.  The patient was then kept on her stretcher in the operating room.  The mask ventilation and IV sedation was given.  A timeout was called and she identifies correct patient and correct right knee.  Premanipulation her extension was almost full and I can flex her to maybe 90 to 95 degrees.  After the manipulation I was able to flex her to 120 degrees.  The knee was put through several cycles of motion.  The superolateral aspect was then cleaned with Betadine  alcohol and an injection of Marcaine  with epinephrine  and steroid was placed in the knee joint.  A Band-Aid was placed over this.  She was taken recovery in stable condition.  Again she will resume physical therapy this afternoon.

## 2024-06-19 NOTE — Anesthesia Procedure Notes (Signed)
 Anesthesia Regional Block: Adductor canal block   Pre-Anesthetic Checklist: , timeout performed,  Correct Patient, Correct Site, Correct Laterality,  Correct Procedure, Correct Position, site marked,  Risks and benefits discussed,  Surgical consent,  Pre-op evaluation,  At surgeon's request and post-op pain management  Laterality: Right  Prep: chloraprep       Needles:  Injection technique: Single-shot  Needle Type: Echogenic Needle     Needle Length: 10cm  Needle Gauge: 21     Additional Needles:   Narrative:  Start time: 06/19/2024 10:12 AM End time: 06/19/2024 10:15 AM Injection made incrementally with aspirations every 5 mL.  Performed by: Personally  Anesthesiologist: Lucious Debby BRAVO, MD  Additional Notes: No pain on injection. No increased resistance to injection. Injection made in 5cc increments. Good needle visualization. Patient tolerated the procedure well.

## 2024-06-19 NOTE — Interval H&P Note (Signed)
 History and Physical Interval Note: The patient understands that she is here today for a manipulation under anesthesia of her right total knee arthroplasty secondary to postoperative arthrofibrosis and scar tissue.  She has not been able to flex her knee past 90 degrees and she understands that manipulation will help with that extensively.  She does have physical therapy later today.  There has been no acute change in her medical status.  The risks and benefits of surgery have been discussed in detail and informed consent has been obtained.  The right operative knee has been marked.  06/19/2024 9:25 AM  Brooke Cantrell  has presented today for surgery, with the diagnosis of Arthrofibrosis Right Knee.  The various methods of treatment have been discussed with the patient and family. After consideration of risks, benefits and other options for treatment, the patient has consented to  Procedure(s) with comments: MANIPULATION, JOINT, KNEE, WITH ANESTHESIA (Right) - RIGHT KNEE MANIPULATION UNDER ANESTHESIA as a surgical intervention.  The patient's history has been reviewed, patient examined, no change in status, stable for surgery.  I have reviewed the patient's chart and labs.  Questions were answered to the patient's satisfaction.     [name]

## 2024-06-19 NOTE — Anesthesia Preprocedure Evaluation (Addendum)
 Anesthesia Evaluation  Patient identified by MRN, date of birth, ID band Patient awake    Reviewed: Allergy & Precautions, NPO status , Patient's Chart, lab work & pertinent test results  History of Anesthesia Complications Negative for: history of anesthetic complications  Airway Mallampati: II  TM Distance: >3 FB Neck ROM: Full    Dental  (+) Dental Advisory Given, Teeth Intact   Pulmonary former smoker   Pulmonary exam normal        Cardiovascular hypertension, Pt. on medications Normal cardiovascular exam     Neuro/Psych  PSYCHIATRIC DISORDERS  Depression    negative neurological ROS     GI/Hepatic negative GI ROS, Neg liver ROS,,,  Endo/Other  negative endocrine ROS    Renal/GU negative Renal ROS     Musculoskeletal  (+) Arthritis ,    Abdominal   Peds  Hematology negative hematology ROS (+)   Anesthesia Other Findings   Reproductive/Obstetrics  Breast cancer                               Anesthesia Physical Anesthesia Plan  ASA: 2  Anesthesia Plan: MAC   Post-op Pain Management: Tylenol  PO (pre-op)* and Regional block*   Induction:   PONV Risk Score and Plan: 2 and Propofol  infusion and Treatment may vary due to age or medical condition  Airway Management Planned: Natural Airway and Simple Face Mask  Additional Equipment: None  Intra-op Plan:   Post-operative Plan:   Informed Consent: I have reviewed the patients History and Physical, chart, labs and discussed the procedure including the risks, benefits and alternatives for the proposed anesthesia with the patient or authorized representative who has indicated his/her understanding and acceptance.       Plan Discussed with: CRNA, Anesthesiologist and Surgeon  Anesthesia Plan Comments:          Anesthesia Quick Evaluation

## 2024-06-19 NOTE — Transfer of Care (Signed)
 Immediate Anesthesia Transfer of Care Note  Patient: Brooke Cantrell  Procedure(s) Performed: MANIPULATION, JOINT, KNEE, WITH ANESTHESIA (Right: Knee)  Patient Location: PACU  Anesthesia Type:MAC  Level of Consciousness: awake and alert   Airway & Oxygen Therapy: Patient Spontanous Breathing  Post-op Assessment: Report given to RN and Post -op Vital signs reviewed and stable  Post vital signs: Reviewed and stable  Last Vitals:  Vitals Value Taken Time  BP    Temp    Pulse    Resp    SpO2      Last Pain:  Vitals:   06/19/24 0942  TempSrc:   PainSc: 0-No pain         Complications: No notable events documented.

## 2024-06-19 NOTE — Discharge Instructions (Signed)
 Resume physical therapy on your right knee this afternoon. Work on flexing and extending your knee is much as possible. There are no restrictions for you. If you do run the level of pain medication and need more do not hesitate to call the office. Ice as needed for swelling.

## 2024-06-19 NOTE — Anesthesia Postprocedure Evaluation (Signed)
 Anesthesia Post Note  Patient: Brooke Cantrell  Procedure(s) Performed: MANIPULATION, JOINT, KNEE, WITH ANESTHESIA (Right: Knee)     Patient location during evaluation: PACU Anesthesia Type: MAC Level of consciousness: awake and alert Pain management: pain level controlled Vital Signs Assessment: post-procedure vital signs reviewed and stable Respiratory status: spontaneous breathing, nonlabored ventilation and respiratory function stable Cardiovascular status: stable and blood pressure returned to baseline Anesthetic complications: no   No notable events documented.  Last Vitals:  Vitals:   06/19/24 1207 06/19/24 1215  BP: (!) 154/85 (!) 154/83  Pulse: 68 74  Resp: 12 14  Temp: 36.5 C   SpO2: 96% 96%    Last Pain:  Vitals:   06/19/24 1207  TempSrc:   PainSc: 0-No pain                 Debby FORBES Like

## 2024-06-20 ENCOUNTER — Ambulatory Visit: Admitting: Physical Therapy

## 2024-06-20 ENCOUNTER — Encounter (HOSPITAL_COMMUNITY): Payer: Self-pay | Admitting: Orthopaedic Surgery

## 2024-06-20 DIAGNOSIS — M25561 Pain in right knee: Secondary | ICD-10-CM

## 2024-06-20 DIAGNOSIS — M25661 Stiffness of right knee, not elsewhere classified: Secondary | ICD-10-CM | POA: Diagnosis not present

## 2024-06-20 DIAGNOSIS — M6281 Muscle weakness (generalized): Secondary | ICD-10-CM

## 2024-06-20 DIAGNOSIS — R600 Localized edema: Secondary | ICD-10-CM | POA: Diagnosis not present

## 2024-06-20 DIAGNOSIS — R262 Difficulty in walking, not elsewhere classified: Secondary | ICD-10-CM

## 2024-06-20 NOTE — Therapy (Signed)
 OUTPATIENT PHYSICAL THERAPY TREATMENT NOTE   Patient Name: Brooke Cantrell MRN: [id number] DOB:1946-02-18, 78 y.o., female Today's Date: 06/20/2024       END OF SESSION:  PT End of Session - 06/20/24 1018     Visit Number 17    Number of Visits 38    Date for Recertification  08/01/24    Authorization Type MEDICARE    Progress Note Due on Visit 26    PT Start Time 1012    PT Stop Time 1053    PT Time Calculation (min) 41 min    Activity Tolerance Patient tolerated treatment well;No increased pain;Patient limited by pain    Behavior During Therapy [facility] for tasks assessed/performed             Past Medical History:  Diagnosis Date   Aortic atherosclerosis 05/2024   Arthritis    Breast cancer (HCC)    Left breast   Depression    mild, no meds   History of kidney stones    passed the stone   HLD (hyperlipidemia)    Hypertension    Osteopenia 05/2024   Past Surgical History:  Procedure Laterality Date   breast cancer surgery     BREAST ENHANCEMENT SURGERY     BREAST LUMPECTOMY Left    CATARACT EXTRACTION, BILATERAL     ECTOPIC PREGNANCY SURGERY     EXAM UNDER ANESTHESIA WITH MANIPULATION OF KNEE Right 06/19/2024   Procedure: MANIPULATION, JOINT, KNEE, WITH ANESTHESIA;  Surgeon: Vernetta Lonni GRADE, MD;  Location: [facility] OR;  Service: Orthopedics;  Laterality: Right;  RIGHT KNEE MANIPULATION UNDER ANESTHESIA   EYE SURGERY     INNER EAR SURGERY     TONSILLECTOMY     TOTAL KNEE ARTHROPLASTY Right 04/22/2024   Procedure: ARTHROPLASTY, KNEE, TOTAL;  Surgeon: Vernetta Lonni GRADE, MD;  Location: [facility] OR;  Service: Orthopedics;  Laterality: Right;   TOTAL SHOULDER ARTHROPLASTY Left 04/25/2018   TOTAL SHOULDER ARTHROPLASTY Left 04/25/2018   Procedure: LEFT TOTAL SHOULDER REPLACEMENT;  Surgeon: Addie Cordella Hamilton, MD;  Location: [facility] OR;  Service: Orthopedics;  Laterality: Left;   TUBAL LIGATION     Patient Active Problem List   Diagnosis Date Noted   Arthrofibrosis  of knee joint, right 06/18/2024   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   Weakness    Hyperlipidemia    Acute respiratory failure due to COVID-19 (HCC) 06/10/2020   Pneumonia due to COVID-19 virus 06/10/2020   Hx of multiple pulmonary nodules 06/10/2020   Acute respiratory failure with hypoxia (HCC)    Lung nodule seen on imaging study 01/08/2020   History of breast cancer 04/14/2019   Sepsis (HCC) 04/02/2019   CAP (community acquired pneumonia) 04/02/2019   Elevated troponin 04/02/2019   Shoulder arthritis 04/25/2018   Primary osteoarthritis, 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   Chronic left shoulder pain 05/17/2017   Cervical disc disorder with radiculopathy 08/14/2016   Chronic infection of sinus 08/31/2015   Cough 08/31/2015   Allergic reaction 08/31/2015    PCP: Alda Carpen, MD  REFERRING PROVIDER: Lonni GRADE Vernetta, MD  REFERRING DIAG: [phone] (ICD-10-CM) - Status post total right knee replacement  THERAPY DIAG:  Acute pain of right knee  Stiffness of right knee, not elsewhere classified  Localized edema  Muscle weakness (generalized)  Difficulty in walking, not elsewhere classified  Rationale for Evaluation and Treatment: Rehabilitation  ONSET DATE: 04/22/2024  SUBJECTIVE:   SUBJECTIVE STATEMENT: Doing well; went up the stairs today for the first time  PERTINENT HISTORY: Lt breast cancer, kidney stones, HTN, Lt TSA 2019  PAIN:  Are you having pain? Yes: NPRS scale: 0/10 Location: Rt knee Pain description: Stiff, achy, occasional sharp Aggravating factors: Prolonged postures and too much WB Relieving factors: Pain meds, ice, change positions frequently  PRECAUTIONS: None  RED FLAGS: None   WEIGHT BEARING RESTRICTIONS: No  FALLS:  Has patient fallen in last 6 months? No  LIVING ENVIRONMENT: Lives with: lives with their family and grandson Lives in: House/apartment Stairs: Uses the rail with steps Has following equipment at  home: Single point cane and Environmental consultant - 2 wheeled  OCCUPATION: Retired  PLOF: Independent  PATIENT GOALS: Return to gardening her acre lot, weed, plant in the garden    OBJECTIVE:  Note: Objective measures were completed at Evaluation unless otherwise noted.  DIAGNOSTIC FINDINGS: Right knee arthroplasty in expected alignment. No periprosthetic lucency or fracture. There has been patellar resurfacing. Recent postsurgical change includes air and edema in the soft tissues and joint space. Anterior skin staples in place.  PATIENT SURVEYS:  PSFS: THE PATIENT SPECIFIC FUNCTIONAL SCALE  Place score of 0-10 (0 = unable to perform activity and 10 = able to perform activity at the same level as before injury or problem)  Activity Date:  05/09/2024 06/02/2024 06/19/2024 (Unable to assess this date)  Walk 2 5   2.  Garden 0 0   3.  Safely transfer to and from the ground 0 0   4.      Total Score 0.67 1.67     Total Score = Sum of activity scores/number of activities  Minimally Detectable Change: 3 points (for single activity); 2 points (for average score)  [name] (nd). The Patient Specific Functional Scale . Retrieved from [email or web address]   COGNITION: Overall cognitive status: Within functional limits for tasks assessed     SENSATION: WFL  EDEMA:  Noted and not objectively assessed  LOWER EXTREMITY ROM:   ROM Left/Right 05/09/2024 Rt 05/13/24 Right 05/20/24 Right 05/21/24 Right 05/26/24 Right 05/28/24 Rt 06/02/2024 Right 06/04/24 Right 06/06/2024 Rt 06/09/2024 Right 06/13/2024 Rt 06/19/2024  Right 06/20/24  Knee flexion 145/52 Supine:  A:  P:  Sitting:  P: 70 Supine AA: 76* Seated P: 78* Standing  P: flexion stretch 82* Seated P: 87* Supine  A: 76deg A: 89  P: 95 Active 93 AROM: 87 PROM: 91 Active 97 AROM: 109deg A: 115  Knee extension 0/7     Seated P: -12*  LAQ Supine:  A: 6deg of flexion A: -11  (seated LAQ) Active lacks 5 AROM: 10deg flexion PROM: 8deg flexion  Active 5 AROM: 3deg PROM: 2deg      (Blank rows = not tested)  LOWER EXTREMITY STRENGTH:  In pounds with hand-held dynamometer Left/Right 05/09/2024 Right 06/04/24  Knee flexion    Knee extension 49.7/5.9 pounds 27.8#   (Blank rows = not tested)  GAIT: Distance walked: 50 feet Assistive device utilized: Environmental consultant - 2 wheeled Level of assistance: Modified independence Comments: Kaida is using her walker full-time at this point   TREATMENT DATE:  06/20/24 TherAct Recumbent bike L3 seat 5-3 x 8 min Leg press bil 100# 3x10; then RLE 50# 2x10; 20 sec flexion holds between sets Floor transfers modi I with support Bridging 2x10; 5 sec hold AA heel slides 2x10; 5 sec hold  06/19/2024 TherEx:  UBE with bilat UE and LE, level 1.0, seat 10 for 3 minutes, seat 7 for 5 minutes  Bilat leg press 3x15  with 75#  Seated LAQ, 1x10 with no weight, 2x10 with 1.5# ankle weight  Supine quad sets with towel under ankle and PT overpressure into extension, 1x10 with 5s hold  Heel slides 1x10  PT discussed importance of continuing seated AAROM knee flexion, heel slides, and quad sets while at home  Manual:  Seated AROM knee flexion with PT providing IR/distraction and overpressure into knee flexion, 1x5 with 5s hold and PT providing PROM into knee extension for 5s; 1x5 with 10s hold into knee flexion with PT then providing PROM knee extension for 10s   Vaso:  Rt LE elevated on wedge for 10 minutes with medium compression at 34deg   06/17/2024 Therapeutic Exercise: precor bike seat 6 flexion stretch for 5 min, backwards full revolutions 3 min, forward full revolutions 2 min Gastroc stretch step heel depression 30 sec 3 reps and 10 reps heel raises bw each rep. Hamstring stretch RLE long sit strap DF 30 sec 3 reps Quad stretch hooklying RLE over edge strap flex 30 sec 3 reps Supine gravity assisted (femur vertical) flexion 5 sec 10 reps   Bridge 10 reps with RLE foot moving closer on rep 3, 6, 9 with 5 sec hold flexion stretch and lift. Quad set on towel roll 5 sec hold 15 reps PT reviewed need to exercise frequently (at least 5 min of each awake hour).  Exercises should include flexibility, strength, balance and muscle endurance.  Walking out 3 min & back 3 min. Then progress 1 min each as tolerated. Pt verbalized understanding.  PT updated HEP with HO and reviewed with demo & verbal cues.  Pt verbalized understanding.   Neuromuscular Re-education Tandem stance 1st rep of each set RLE in front & 2nd rep in back;  1st set floor eyes open, 2nd set floor eyes closed intermittent touch, 3rd set foam eyes open intermittent touch  Vaso RLE knee elevation medium compression 34*  10 min with ext stretch   06/13/2024 Quad sets with right heel prop 2 sets of 10 for 5 seconds Seated knee flexion AAROM (left pushes right into flexion) 10 x 10 seconds Recumbent bike Seat 6 for 8 minutes (AAROM to 10 full revolutions in each direction)  Manual: Knee flexion with PT assist 25 x 10 seconds  Vaso Right Knee 10 minutes High 34*   06/11/2024 TherAct:   UBE with bilat UE and LE , level 1 for 10 minutes full revolutions  Bilat LE press 1x15 with 75#, 2x15 with 93#  Rt Le leg press 3x12 with 43#  Step downs with 4 step 1x10, with 6 step 1x10 with bilat UE  Patient performed reciprocal pattern on full set of stairs with bilat UE use on handrails with CGA throughout  Intermittent circumduction with Rt LE and highly fearful of stairs  Improved performance with ascending vs descending  PT discussed manipulation procedure and pain levels following, what to expect with POC following procedure, fear/anxiety with stairs, and potential requirement for increased balance activities     PATIENT EDUCATION:  Education details: See above Person educated: Patient Education method: Explanation, Demonstration, Tactile cues, Verbal cues, and  Handouts Education comprehension: verbalized understanding, returned demonstration, verbal cues required, tactile cues required, and needs further education  HOME EXERCISE PROGRAM: Access Code: [email or web address] URL: [email or web address] Access Code: [email or web address] URL: [email or web address] Date: 06/16/2024 Prepared by: Grayce Spatz  Exercises - Seated Knee Flexion AAROM  - 5 x daily - 7 x weekly - 1 sets - 10 reps - 10 seconds hold -  Supine Quadricep Sets  - 10 x daily - 7 x weekly - 10 sets - 10 reps - 5 second hold - Seated Knee Flexion Extension AROM   - 2-4 x daily - 7 x weekly - 2-3 sets - 10 reps - 5 seconds hold - Seated Knee Flexion Extension AAROM with Overpressure  - 2-4 x daily - 7 x weekly - 2-3 sets - 10 reps - 5 seconds hold - Seated Long Arc Quad  - 2-4 x daily - 7 x weekly - 2-3 sets - 10 reps - 5 seconds hold - Supine Heel Slide with Strap  - 2-4 x daily - 7 x weekly - 2-3 sets - 10 reps - 5 seconds hold - Standing Knee Flexion Stretch on Step  - 2-3 x daily - 7 x weekly - 3-5 sets - 3 reps - 10 seconds hold - Gastroc Stretch on Step  - 1-3 x daily - 7 x weekly - 1 sets - 3 reps - 30 seconds hold - Standing Heel Raise  - 1-2 x daily - 7 x weekly - 2-3 sets - 10 reps - 2-3 seconds hold - Tandem Stance  - 1 x daily - 7 x weekly - 3 sets - 2 reps - 30 seconds hold - Seated Table Hamstring Stretch  - 1-3 x daily - 7 x weekly - 1 sets - 3 reps - 30 seconds hold - Seated Hamstring Stretch with Strap  - 1-2 x daily - 7 x weekly - 1 sets - 3 reps - 30 seconds hold - Seated Passive Knee Extension  - 2 x daily - 7 x weekly - 3-5 minutes hold - Prone Knee Extension with Ankle Weight  - 2 x daily - 7 x weekly - 3-5 minute hold - Supine Quadriceps Stretch with Strap on Table  - 1-3 x daily - 7 x weekly - 1 sets - 3 reps - 30 seconds hold  ASSESSMENT:  CLINICAL IMPRESSION: Excellent improvement in ROM since MUA and overall progressing well with PT.  Continue skilled  PT at this time to maximize function.  OBJECTIVE IMPAIRMENTS: Abnormal gait, decreased activity tolerance, decreased endurance, decreased knowledge of condition, difficulty walking, decreased ROM, decreased strength, increased edema, and pain.   ACTIVITY LIMITATIONS: bending, sitting, standing, squatting, sleeping, stairs, transfers, bed mobility, dressing, and locomotion level  PARTICIPATION LIMITATIONS: community activity and yard work  PERSONAL FACTORS: Lt breast cancer, kidney stones, HTN, Lt TSA 2019 are also affecting patient's functional outcome.   REHAB POTENTIAL: Good  CLINICAL DECISION MAKING: Stable/uncomplicated  EVALUATION COMPLEXITY: Low   GOALS: Goals reviewed with patient? Yes  SHORT TERM GOALS: Target date: 06/20/2024  Mahitha will be independent with her day 1 home exercise program Baseline: Started 05/09/2024 Goal status:   Met 06/06/2024  2.  Improve right knee active range of motion to 3 - 0 -95 degrees Baseline: 7 - 0 - 52 degrees Goal status: Partially Met 06/13/2024  3.  Improve right quadriceps strength to at least 30 pounds Baseline: 5.9 pounds Goal status: ONGOING      06/16/2024   LONG TERM GOALS: Target date: 08/01/2024  Improve patient specific functional score to at least 5 Baseline: 0.67 Goal status: ongoing, 06/19/2024  2.  Kendalynn will report right knee pain consistently 3/10 or better on the numeric pain rating scale Baseline: Can be as high as 8/10 Goal status: ongoing, 06/19/2024  3.  Improve right knee AROM to 0-2-110 degrees (updated, 06/19/2024) Baseline: 0-2-109 06/19/2024 Goal  status: ongoing, 06/19/2024  4.  Improve bilateral quadriceps strength to 60 pounds or better Baseline: See objective Goal status: ongoing, 06/19/2024  5.  Analayah will be comfortable without the use of an assistive device within the house and with no more than a single-point cane outside the house at transfer into independent rehabilitation Baseline: 2 wheeled walker  full-time Goal status:  Met 06/13/2024  6.  We will be independent with a long-term home maintenance exercise program at discharge Baseline: Started 05/09/2024 Goal status: ongoing, 06/19/2024   PLAN:  PT FREQUENCY: 5x/wk for 2 weeks then 2-3x/wk for 4 weeks   PT DURATION: 6 weeks   PLANNED INTERVENTIONS: 97750- Physical Performance Testing, 97110-Therapeutic exercises, 97530- Therapeutic activity, 97112- Neuromuscular re-education, 97535- Self Care, [id number]- Manual therapy, [phone]- Gait training, [phone]- Electrical stimulation (unattended), [id number]- Vasopneumatic device, Patient/Family education, Balance training, Stair training, Joint mobilization, and Cryotherapy  PLAN FOR NEXT SESSION:  check PSFS, knee ROM and strength, manual therapy, vaso for edema, balance, ambulation on ramps and stairs (highly fearful of stairs).   Corean JULIANNA Ku, PT, DPT 06/20/24 10:57 AM

## 2024-06-22 NOTE — Therapy (Signed)
 OUTPATIENT PHYSICAL THERAPY TREATMENT NOTE   Patient Name: Brooke Cantrell MRN: [id number] DOB:04/07/1946, 78 y.o., female Today's Date: 06/23/2024       END OF SESSION:  PT End of Session - 06/23/24 1712     Visit Number 18    Number of Visits 38    Date for Recertification  08/01/24    Authorization Type MEDICARE    Progress Note Due on Visit 26    PT Start Time 0930    PT Stop Time 1000    PT Time Calculation (min) 30 min    Activity Tolerance Patient tolerated treatment well    Behavior During Therapy [facility] for tasks assessed/performed              Past Medical History:  Diagnosis Date   Aortic atherosclerosis 05/2024   Arthritis    Breast cancer (HCC)    Left breast   Depression    mild, no meds   History of kidney stones    passed the stone   HLD (hyperlipidemia)    Hypertension    Osteopenia 05/2024   Past Surgical History:  Procedure Laterality Date   breast cancer surgery     BREAST ENHANCEMENT SURGERY     BREAST LUMPECTOMY Left    CATARACT EXTRACTION, BILATERAL     ECTOPIC PREGNANCY SURGERY     EXAM UNDER ANESTHESIA WITH MANIPULATION OF KNEE Right 06/19/2024   Procedure: MANIPULATION, JOINT, KNEE, WITH ANESTHESIA;  Surgeon: Vernetta Lonni GRADE, MD;  Location: [facility] OR;  Service: Orthopedics;  Laterality: Right;  RIGHT KNEE MANIPULATION UNDER ANESTHESIA   EYE SURGERY     INNER EAR SURGERY     TONSILLECTOMY     TOTAL KNEE ARTHROPLASTY Right 04/22/2024   Procedure: ARTHROPLASTY, KNEE, TOTAL;  Surgeon: Vernetta Lonni GRADE, MD;  Location: [facility] OR;  Service: Orthopedics;  Laterality: Right;   TOTAL SHOULDER ARTHROPLASTY Left 04/25/2018   TOTAL SHOULDER ARTHROPLASTY Left 04/25/2018   Procedure: LEFT TOTAL SHOULDER REPLACEMENT;  Surgeon: Addie Cordella Hamilton, MD;  Location: [facility] OR;  Service: Orthopedics;  Laterality: Left;   TUBAL LIGATION     Patient Active Problem List   Diagnosis Date Noted   Left bundle branch block 06/23/2024   Dizziness  06/23/2024   Coronary artery calcification 06/23/2024   Arthrofibrosis of knee joint, right 06/18/2024   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   Weakness    Hyperlipidemia    Acute respiratory failure due to COVID-19 (HCC) 06/10/2020   Pneumonia due to COVID-19 virus 06/10/2020   Hx of multiple pulmonary nodules 06/10/2020   Acute respiratory failure with hypoxia (HCC)    Lung nodule seen on imaging study 01/08/2020   History of breast cancer 04/14/2019   Sepsis (HCC) 04/02/2019   CAP (community acquired pneumonia) 04/02/2019   Elevated troponin 04/02/2019   Shoulder arthritis 04/25/2018   Primary osteoarthritis, 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   Chronic left shoulder pain 05/17/2017   Cervical disc disorder with radiculopathy 08/14/2016   Chronic infection of sinus 08/31/2015   Cough 08/31/2015   Allergic reaction 08/31/2015    PCP: Alda Carpen, MD  REFERRING PROVIDER: Lonni GRADE Vernetta, MD  REFERRING DIAG: [phone] (ICD-10-CM) - Status post total right knee replacement  THERAPY DIAG:  Acute pain of right knee  Stiffness of right knee, not elsewhere classified  Localized edema  Muscle weakness (generalized)  Difficulty in walking, not elsewhere classified  Rationale for Evaluation and Treatment: Rehabilitation  ONSET DATE: 04/22/2024  SUBJECTIVE:  SUBJECTIVE STATEMENT: Pt reports feeling ill for the last few days.  I feel a little dizzy. Minimal to no pain reported.  Not taking prescribed pain meds only Tylenol .  PERTINENT HISTORY: Lt breast cancer, kidney stones, HTN, Lt TSA 2019  PAIN:  Are you having pain? Yes: NPRS scale: 0/10 Location: Rt knee Pain description: Stiff, achy, occasional sharp Aggravating factors: Prolonged postures and too much WB Relieving factors: Pain meds, ice, change positions frequently  PRECAUTIONS: None  RED FLAGS: None   WEIGHT BEARING RESTRICTIONS: No  FALLS:  Has patient fallen in last 6 months?  No  LIVING ENVIRONMENT: Lives with: lives with their family and grandson Lives in: House/apartment Stairs: Uses the rail with steps Has following equipment at home: Single point cane and Environmental consultant - 2 wheeled  OCCUPATION: Retired  PLOF: Independent  PATIENT GOALS: Return to gardening her acre lot, weed, plant in the garden    OBJECTIVE:  Note: Objective measures were completed at Evaluation unless otherwise noted.  DIAGNOSTIC FINDINGS: Right knee arthroplasty in expected alignment. No periprosthetic lucency or fracture. There has been patellar resurfacing. Recent postsurgical change includes air and edema in the soft tissues and joint space. Anterior skin staples in place.  PATIENT SURVEYS:  PSFS: THE PATIENT SPECIFIC FUNCTIONAL SCALE  Place score of 0-10 (0 = unable to perform activity and 10 = able to perform activity at the same level as before injury or problem)  Activity Date:  05/09/2024 06/02/2024 06/19/2024 (Unable to assess this date)  Walk 2 5   2.  Garden 0 0   3.  Safely transfer to and from the ground 0 0   4.      Total Score 0.67 1.67     Total Score = Sum of activity scores/number of activities  Minimally Detectable Change: 3 points (for single activity); 2 points (for average score)  [name] (nd). The Patient Specific Functional Scale . Retrieved from [email or web address]   COGNITION: Overall cognitive status: Within functional limits for tasks assessed     SENSATION: WFL  EDEMA:  Noted and not objectively assessed  LOWER EXTREMITY ROM:   ROM Left/Right 05/09/2024 Rt 05/13/24 Right 05/20/24 Right 05/21/24 Right 05/26/24 Right 05/28/24 Rt 06/02/2024 Right 06/04/24 Right 06/06/2024 Rt 06/09/2024 Right 06/13/2024 Rt 06/19/2024  Right 06/20/24  Knee flexion 145/52 Supine:  A:  P:  Sitting:  P: 70 Supine AA: 76* Seated P: 78* Standing  P: flexion stretch 82* Seated P: 87* Supine   A: 76deg A: 89  P: 95 Active 93 AROM: 87 PROM: 91 Active 97 AROM: 109deg A: 115  Knee extension 0/7     Seated P: -12*  LAQ Supine:  A: 6deg of flexion A: -11 (seated LAQ) Active lacks 5 AROM: 10deg flexion PROM: 8deg flexion  Active 5 AROM: 3deg PROM: 2deg      (Blank rows = not tested)  LOWER EXTREMITY STRENGTH:  In pounds with hand-held dynamometer Left/Right 05/09/2024 Right 06/04/24  Knee flexion    Knee extension 49.7/5.9 pounds 27.8#   (Blank rows = not tested)  GAIT: Distance walked: 50 feet Assistive device utilized: Walker - 2 wheeled Level of assistance: Modified independence Comments: Ladaisha is using her walker full-time at this point   TREATMENT DATE:  06/23/24 R Knee   Pt had Knee manipulation on 06/19/24.  She had PT on 06/19/24 and 06/20/24.  She states after PT on 10/10 she vomited in the bathroom.  She entered clinic this morning and states  she has felt dizzy and  nauseous all weekend.  Still felt ill.  I checked her BP in sitting and it was 131/111.  She was placed in supine to rest. Pulse Ox was 92 and dropped to 88 at one point.  Inconsistent wavelength of patient noted on Pulse OX.  Nurse consulted and Tory Gaskins PA came in and agreed with calling EMS.  Pt was taken via EMS to ED. Self Care - Pt educated on signs and symptoms of high BP, heart attack.  Instructed in deep breathing to increase oxygen levels.     06/20/24 TherAct Recumbent bike L3 seat 5-3 x 8 min Leg press bil 100# 3x10; then RLE 50# 2x10; 20 sec flexion holds between sets Floor transfers modi I with support Bridging 2x10; 5 sec hold AA heel slides 2x10; 5 sec hold  06/19/2024 TherEx:  UBE with bilat UE and LE, level 1.0, seat 10 for 3 minutes, seat 7 for 5 minutes  Bilat leg press 3x15 with 75#  Seated LAQ, 1x10 with no weight, 2x10 with 1.5# ankle weight  Supine quad sets with towel under ankle and PT overpressure into extension, 1x10 with 5s hold  Heel slides 1x10  PT discussed  importance of continuing seated AAROM knee flexion, heel slides, and quad sets while at home  Manual:  Seated AROM knee flexion with PT providing IR/distraction and overpressure into knee flexion, 1x5 with 5s hold and PT providing PROM into knee extension for 5s; 1x5 with 10s hold into knee flexion with PT then providing PROM knee extension for 10s   Vaso:  Rt LE elevated on wedge for 10 minutes with medium compression at 34deg   06/17/2024 Therapeutic Exercise: precor bike seat 6 flexion stretch for 5 min, backwards full revolutions 3 min, forward full revolutions 2 min Gastroc stretch step heel depression 30 sec 3 reps and 10 reps heel raises bw each rep. Hamstring stretch RLE long sit strap DF 30 sec 3 reps Quad stretch hooklying RLE over edge strap flex 30 sec 3 reps Supine gravity assisted (femur vertical) flexion 5 sec 10 reps  Bridge 10 reps with RLE foot moving closer on rep 3, 6, 9 with 5 sec hold flexion stretch and lift. Quad set on towel roll 5 sec hold 15 reps PT reviewed need to exercise frequently (at least 5 min of each awake hour).  Exercises should include flexibility, strength, balance and muscle endurance.  Walking out 3 min & back 3 min. Then progress 1 min each as tolerated. Pt verbalized understanding.  PT updated HEP with HO and reviewed with demo & verbal cues.  Pt verbalized understanding.   Neuromuscular Re-education Tandem stance 1st rep of each set RLE in front & 2nd rep in back;  1st set floor eyes open, 2nd set floor eyes closed intermittent touch, 3rd set foam eyes open intermittent touch  Vaso RLE knee elevation medium compression 34*  10 min with ext stretch   06/13/2024 Quad sets with right heel prop 2 sets of 10 for 5 seconds Seated knee flexion AAROM (left pushes right into flexion) 10 x 10 seconds Recumbent bike Seat 6 for 8 minutes (AAROM to 10 full revolutions in each direction)  Manual: Knee flexion with PT assist 25 x 10 seconds  Vaso Right  Knee 10 minutes High 34*   06/11/2024 TherAct:   UBE with bilat UE and LE , level 1 for 10 minutes full revolutions  Bilat LE press 1x15 with 75#, 2x15 with 93#  Rt Le leg press 3x12 with 43#  Step downs with 4 step 1x10, with 6 step 1x10 with bilat UE  Patient performed reciprocal pattern on full set of stairs with bilat UE use on handrails with CGA throughout  Intermittent circumduction with Rt LE and highly fearful of stairs  Improved performance with ascending vs descending  PT discussed manipulation procedure and pain levels following, what to expect with POC following procedure, fear/anxiety with stairs, and potential requirement for increased balance activities     PATIENT EDUCATION:  Education details: See above Person educated: Patient Education method: Explanation, Demonstration, Tactile cues, Verbal cues, and Handouts Education comprehension: verbalized understanding, returned demonstration, verbal cues required, tactile cues required, and needs further education  HOME EXERCISE PROGRAM: Access Code: [email or web address] URL: [email or web address] Access Code: [email or web address] URL: [email or web address] Date: 06/16/2024 Prepared by: Grayce Spatz  Exercises - Seated Knee Flexion AAROM  - 5 x daily - 7 x weekly - 1 sets - 10 reps - 10 seconds hold - Supine Quadricep Sets  - 10 x daily - 7 x weekly - 10 sets - 10 reps - 5 second hold - Seated Knee Flexion Extension AROM   - 2-4 x daily - 7 x weekly - 2-3 sets - 10 reps - 5 seconds hold - Seated Knee Flexion Extension AAROM with Overpressure  - 2-4 x daily - 7 x weekly - 2-3 sets - 10 reps - 5 seconds hold - Seated Long Arc Quad  - 2-4 x daily - 7 x weekly - 2-3 sets - 10 reps - 5 seconds hold - Supine Heel Slide with Strap  - 2-4 x daily - 7 x weekly - 2-3 sets - 10 reps - 5 seconds hold - Standing Knee Flexion Stretch on Step  - 2-3 x daily - 7 x weekly - 3-5 sets - 3 reps - 10 seconds hold - Gastroc Stretch on  Step  - 1-3 x daily - 7 x weekly - 1 sets - 3 reps - 30 seconds hold - Standing Heel Raise  - 1-2 x daily - 7 x weekly - 2-3 sets - 10 reps - 2-3 seconds hold - Tandem Stance  - 1 x daily - 7 x weekly - 3 sets - 2 reps - 30 seconds hold - Seated Table Hamstring Stretch  - 1-3 x daily - 7 x weekly - 1 sets - 3 reps - 30 seconds hold - Seated Hamstring Stretch with Strap  - 1-2 x daily - 7 x weekly - 1 sets - 3 reps - 30 seconds hold - Seated Passive Knee Extension  - 2 x daily - 7 x weekly - 3-5 minutes hold - Prone Knee Extension with Ankle Weight  - 2 x daily - 7 x weekly - 3-5 minute hold - Supine Quadriceps Stretch with Strap on Table  - 1-3 x daily - 7 x weekly - 1 sets - 3 reps - 30 seconds hold  ASSESSMENT:  CLINICAL IMPRESSION: See treatment note- pt taken via EMS to ED due to symptoms noted. OBJECTIVE IMPAIRMENTS: Abnormal gait, decreased activity tolerance, decreased endurance, decreased knowledge of condition, difficulty walking, decreased ROM, decreased strength, increased edema, and pain.   ACTIVITY LIMITATIONS: bending, sitting, standing, squatting, sleeping, stairs, transfers, bed mobility, dressing, and locomotion level  PARTICIPATION LIMITATIONS: community activity and yard work  PERSONAL FACTORS: Lt breast cancer, kidney stones, HTN, Lt TSA 2019 are also affecting patient's functional outcome.   REHAB POTENTIAL: Good  CLINICAL DECISION MAKING: Stable/uncomplicated  EVALUATION COMPLEXITY: Low   GOALS: Goals reviewed with patient? Yes  SHORT TERM GOALS: Target date: 06/20/2024  Teniya will be independent with her day 1 home exercise program Baseline: Started 05/09/2024 Goal status:   Met 06/06/2024  2.  Improve right knee active range of motion to 3 - 0 -95 degrees Baseline: 7 - 0 - 52 degrees Goal status: Partially Met 06/13/2024  3.  Improve right quadriceps strength to at least 30 pounds Baseline: 5.9 pounds Goal status: ONGOING      06/16/2024   LONG TERM  GOALS: Target date: 08/01/2024  Improve patient specific functional score to at least 5 Baseline: 0.67 Goal status: ongoing, 06/19/2024  2.  Kailia will report right knee pain consistently 3/10 or better on the numeric pain rating scale Baseline: Can be as high as 8/10 Goal status: ongoing, 06/19/2024  3.  Improve right knee AROM to 0-2-110 degrees (updated, 06/19/2024) Baseline: 0-2-109 06/19/2024 Goal status: ongoing, 06/19/2024  4.  Improve bilateral quadriceps strength to 60 pounds or better Baseline: See objective Goal status: ongoing, 06/19/2024  5.  Cheril will be comfortable without the use of an assistive device within the house and with no more than a single-point cane outside the house at transfer into independent rehabilitation Baseline: 2 wheeled walker full-time Goal status:  Met 06/13/2024  6.  We will be independent with a long-term home maintenance exercise program at discharge Baseline: Started 05/09/2024 Goal status: ongoing, 06/19/2024   PLAN:  PT FREQUENCY: 5x/wk for 2 weeks then 2-3x/wk for 4 weeks   PT DURATION: 6 weeks   PLANNED INTERVENTIONS: 97750- Physical Performance Testing, 97110-Therapeutic exercises, 97530- Therapeutic activity, 97112- Neuromuscular re-education, 97535- Self Care, 02859- Manual therapy, [phone]- Gait training, [phone]- Electrical stimulation (unattended), [id number]- Vasopneumatic device, Patient/Family education, Balance training, Stair training, Joint mobilization, and Cryotherapy  PLAN FOR NEXT SESSION:  Continue with advancing motion dependent on resolution of treatment at ED.   Check PSFS, knee ROM and strength, manual therapy, vaso for edema, balance, ambulation on ramps and stairs (highly fearful of stairs).  Burnard Meth, PT 06/23/24  5:14 PM

## 2024-06-23 ENCOUNTER — Emergency Department (HOSPITAL_COMMUNITY): Admission: EM | Admit: 2024-06-23 | Discharge: 2024-06-23 | Disposition: A | Discharge status: Home / Self Care

## 2024-06-23 ENCOUNTER — Encounter (HOSPITAL_COMMUNITY): Payer: Self-pay

## 2024-06-23 ENCOUNTER — Ambulatory Visit (INDEPENDENT_AMBULATORY_CARE_PROVIDER_SITE_OTHER)

## 2024-06-23 ENCOUNTER — Other Ambulatory Visit: Payer: Self-pay | Admitting: Student

## 2024-06-23 ENCOUNTER — Other Ambulatory Visit: Payer: Self-pay

## 2024-06-23 ENCOUNTER — Emergency Department (HOSPITAL_COMMUNITY)

## 2024-06-23 DIAGNOSIS — I447 Left bundle-branch block, unspecified: Secondary | ICD-10-CM

## 2024-06-23 DIAGNOSIS — R600 Localized edema: Secondary | ICD-10-CM | POA: Diagnosis not present

## 2024-06-23 DIAGNOSIS — M25661 Stiffness of right knee, not elsewhere classified: Secondary | ICD-10-CM | POA: Diagnosis not present

## 2024-06-23 DIAGNOSIS — M25561 Pain in right knee: Secondary | ICD-10-CM | POA: Diagnosis not present

## 2024-06-23 DIAGNOSIS — R42 Dizziness and giddiness: Secondary | ICD-10-CM

## 2024-06-23 DIAGNOSIS — R112 Nausea with vomiting, unspecified: Secondary | ICD-10-CM | POA: Insufficient documentation

## 2024-06-23 DIAGNOSIS — I2510 Atherosclerotic heart disease of native coronary artery without angina pectoris: Secondary | ICD-10-CM

## 2024-06-23 DIAGNOSIS — R262 Difficulty in walking, not elsewhere classified: Secondary | ICD-10-CM

## 2024-06-23 DIAGNOSIS — M6281 Muscle weakness (generalized): Secondary | ICD-10-CM

## 2024-06-23 DIAGNOSIS — R110 Nausea: Secondary | ICD-10-CM

## 2024-06-23 LAB — CBC WITH DIFFERENTIAL/PLATELET
Abs Immature Granulocytes: 0.03 K/uL (ref 0.00–0.07)
Basophils Absolute: 0 K/uL (ref 0.0–0.1)
Basophils Relative: 0 %
Eosinophils Absolute: 0.7 K/uL — ABNORMAL HIGH (ref 0.0–0.5)
Eosinophils Relative: 7 %
HCT: 41.3 % (ref 36.0–46.0)
Hemoglobin: 13.8 g/dL (ref 12.0–15.0)
Immature Granulocytes: 0 %
Lymphocytes Relative: 23 %
Lymphs Abs: 2.3 K/uL (ref 0.7–4.0)
MCH: 30.3 pg (ref 26.0–34.0)
MCHC: 33.4 g/dL (ref 30.0–36.0)
MCV: 90.8 fL (ref 80.0–100.0)
Monocytes Absolute: 1 K/uL (ref 0.1–1.0)
Monocytes Relative: 10 %
Neutro Abs: 5.9 K/uL (ref 1.7–7.7)
Neutrophils Relative %: 60 %
Platelets: 274 K/uL (ref 150–400)
RBC: 4.55 MIL/uL (ref 3.87–5.11)
RDW: 12.9 % (ref 11.5–15.5)
WBC: 9.8 K/uL (ref 4.0–10.5)
nRBC: 0 % (ref 0.0–0.2)

## 2024-06-23 LAB — BASIC METABOLIC PANEL WITH GFR
Anion gap: 7 (ref 5–15)
BUN: 17 mg/dL (ref 8–23)
CO2: 28 mmol/L (ref 22–32)
Calcium: 9.2 mg/dL (ref 8.9–10.3)
Chloride: 100 mmol/L (ref 98–111)
Creatinine, Ser: 0.86 mg/dL (ref 0.44–1.00)
GFR, Estimated: >60 mL/min (ref >60)
Glucose, Bld: 95 mg/dL (ref 70–99)
Potassium: 3.5 mmol/L (ref 3.5–5.1)
Sodium: 135 mmol/L (ref 135–145)

## 2024-06-23 LAB — RESP PANEL BY RT-PCR (RSV, FLU A&B, COVID)  RVPGX2
Influenza A by PCR: NEGATIVE
Influenza B by PCR: NEGATIVE
Resp Syncytial Virus by PCR: NEGATIVE
SARS Coronavirus 2 by RT PCR: NEGATIVE

## 2024-06-23 LAB — I-STAT CHEM 8, ED
BUN: 24 mg/dL — ABNORMAL HIGH (ref 8–23)
Calcium, Ion: 1.15 mmol/L (ref 1.15–1.40)
Chloride: 97 mmol/L — ABNORMAL LOW (ref 98–111)
Creatinine, Ser: 1 mg/dL (ref 0.44–1.00)
Glucose, Bld: 103 mg/dL — ABNORMAL HIGH (ref 70–99)
HCT: 42 % (ref 36.0–46.0)
Hemoglobin: 14.3 g/dL (ref 12.0–15.0)
Potassium: 4.2 mmol/L (ref 3.5–5.1)
Sodium: 139 mmol/L (ref 135–145)
TCO2: 31 mmol/L (ref 22–32)

## 2024-06-23 LAB — TROPONIN I (HIGH SENSITIVITY)
Troponin I (High Sensitivity): 10 ng/L (ref <18)
Troponin I (High Sensitivity): 13 ng/L (ref <18)

## 2024-06-23 MED ORDER — MECLIZINE HCL 25 MG PO TABS: 25.0000 mg | ORAL_TABLET | Freq: Three times a day (TID) | ORAL | 0 refills | Status: AC | PRN

## 2024-06-23 MED ORDER — LACTATED RINGERS IV BOLUS
500.0000 mL | Freq: Once | INTRAVENOUS | Status: AC
  Administered 2024-06-23: 500 mL via INTRAVENOUS

## 2024-06-23 MED ORDER — ONDANSETRON HCL 4 MG PO TABS: 4.0000 mg | ORAL_TABLET | Freq: Three times a day (TID) | ORAL | 0 refills | Status: AC | PRN

## 2024-06-23 MED ORDER — ONDANSETRON HCL 4 MG/2ML IJ SOLN
4.0000 mg | Freq: Once | INTRAMUSCULAR | Status: AC
  Administered 2024-06-23: 4 mg via INTRAVENOUS
  Filled 2024-06-23: qty 2

## 2024-06-23 NOTE — Progress Notes (Signed)
 Outpatient Cardiac CT per Dr [place].

## 2024-06-23 NOTE — Consult Note (Signed)
 Cardiology Consultation   Patient ID: Brooke Cantrell MRN: [id number]; DOB: [birth year]  Admit date: 06/23/2024 Date of Consult: 06/23/2024  PCP:  Alla Amis, MD   [facility] Providers Cardiologist:  Peter Swaziland, MD      Patient Profile: Brooke Cantrell is a 78 y.o. female with a hx of osteoporosis, hypertension, aortic atherosclerosis, left total shoulder replacement on 04/2018, and right total knee arthroplasty on 04/22/2024 with manipulation on 06/19/2024  who is being seen 06/23/2024 for the evaluation of chest pain at the request of Duwaine Fusi DO.  History of Present Illness: Brooke Cantrell is a 78 year old female who per chart review has not been seen by cardiology before.  Patient has reportedly previously had a muscle pains due to statins. PTA was only on zetia .  Chest CT on 05/2022 found coronary artery calcifications  Patient presented to the emergency department for nausea vomiting and dizziness since Friday.  Also complaining of pain in the left shoulder blade.  On interview the patient reported that she had a knee manipulation done on Friday.  Reported that she had nausea and vomiting on Friday.  Has also had right headedness and dizziness.  There are several episodes of presyncope as well.  Denies chest pain, new neck pain, new back pain, shortness of breath, diaphoresis, and lower extremity edema.  Has a left shoulder pain that is worse when the patient lays on her shoulder.  Denies that the pain is worse when walking.  Denies tobacco use.  Drinks about 3 margaritas a week.  Takes marijuana Gummies about once a month. Has been less active than usually with the recent surgical procedure.  Labs showed negative high-sensitivity troponin of 10, potassium of 4.2, chloride of 97, elevated BUN of 24, creatinine of 1.0, normal WBC count of 9.8, and hemoglobin of 13.8.  EKG showed sinus rhythm with a rate of 86 and a new left bundle branch block since most recent EKG on  04/18/2024. Was present on EKG in 2020.  Chest x-ray showed no active cardiopulmonary disease.   Past Medical History:  Diagnosis Date   Aortic atherosclerosis 05/2024   Arthritis    Breast cancer (HCC)    Left breast   Depression    mild, no meds   History of kidney stones    passed the stone   HLD (hyperlipidemia)    Hypertension    Osteopenia 05/2024    Past Surgical History:  Procedure Laterality Date   breast cancer surgery     BREAST ENHANCEMENT SURGERY     BREAST LUMPECTOMY Left    CATARACT EXTRACTION, BILATERAL     ECTOPIC PREGNANCY SURGERY     EXAM UNDER ANESTHESIA WITH MANIPULATION OF KNEE Right 06/19/2024   Procedure: MANIPULATION, JOINT, KNEE, WITH ANESTHESIA;  Surgeon: Vernetta Lonni GRADE, MD;  Location: [facility] OR;  Service: Orthopedics;  Laterality: Right;  RIGHT KNEE MANIPULATION UNDER ANESTHESIA   EYE SURGERY     INNER EAR SURGERY     TONSILLECTOMY     TOTAL KNEE ARTHROPLASTY Right 04/22/2024   Procedure: ARTHROPLASTY, KNEE, TOTAL;  Surgeon: Vernetta Lonni GRADE, MD;  Location: [facility] OR;  Service: Orthopedics;  Laterality: Right;   TOTAL SHOULDER ARTHROPLASTY Left 04/25/2018   TOTAL SHOULDER ARTHROPLASTY Left 04/25/2018   Procedure: LEFT TOTAL SHOULDER REPLACEMENT;  Surgeon: Addie Cordella Hamilton, MD;  Location: [facility];  Service: Orthopedics;  Laterality: Left;   TUBAL LIGATION       Home Medications:  Prior to Admission medications  Medication Sig Start Date End Date Taking? Authorizing Provider  aspirin  81 MG chewable tablet Chew 1 tablet (81 mg total) by mouth 2 (two) times daily. Patient not taking: Reported on 06/17/2024 04/23/24   Vernetta Lonni GRADE, MD  calcium  citrate-vitamin D  (CITRACAL+D) 315-200 MG-UNIT tablet Take 2 tablets by mouth 2 (two) times daily with a meal.    [provider]  chlorthalidone  (HYGROTON ) 25 MG tablet Take 25 mg by mouth daily.    [provider]  cholecalciferol  (VITAMIN D3) 25 MCG (1000 UNIT) tablet  Take 1,000 Units by mouth in the morning and at bedtime.    [provider]  D-Mannose (MANNXTRA PO) Take 1 capsule by mouth daily.    [provider]  ezetimibe  (ZETIA ) 10 MG tablet Take 10 mg by mouth daily.    [provider]  methocarbamol  (ROBAXIN ) 500 MG tablet Take 1 tablet (500 mg total) by mouth every 6 (six) hours as needed for muscle spasms. Patient not taking: Reported on 06/17/2024 05/03/24   Vernetta Lonni GRADE, MD  Multiple Vitamin (MULTIVITAMIN) capsule Take 1 capsule by mouth daily.    [provider]  nitrofurantoin , macrocrystal-monohydrate, (MACROBID ) 100 MG capsule TAKE 1 CAPSULE BY MOUTH TWICE A DAY Patient not taking: Reported on 06/17/2024 05/14/24   Vernetta Lonni GRADE, MD  traMADol  (ULTRAM ) 50 MG tablet Take 1-2 tablets (50-100 mg total) by mouth every 6 (six) hours as needed. 06/19/24   Vernetta Lonni GRADE, MD    Scheduled Meds:  Continuous Infusions:  PRN Meds:   Allergies:    Allergies  Allergen Reactions   Ciprofloxacin Itching, Rash and Hives   Oxycodone  Rash and Other (See Comments)    Itching, dry skin     Social History:   Social History   Socioeconomic History   Marital status: Married    Spouse name: Not on file   Number of children: Not on file   Years of education: Not on file   Highest education level: Not on file  Occupational History   Not on file  Tobacco Use   Smoking status: Former    Current packs/day: 0.00    Types: Cigarettes    Quit date: 09/11/1996    Years since quitting: 27.8   Smokeless tobacco: Never  Vaping Use   Vaping status: Never Used  Substance and Sexual Activity   Alcohol use: Yes    Alcohol/week: 5.0 - 7.0 standard drinks of alcohol    Types: 5 - 7 Standard drinks or equivalent per week    Comment: 5-7 per week, when going out eat   Drug use: Never   Sexual activity: Not Currently    Birth control/protection: Post-menopausal  Other Topics Concern   Not on file   Social History Narrative   Not on file   Social Drivers of Health   Financial Resource Strain: Low Risk  (08/01/2023)   Received from [facility]   Overall Financial Resource Strain (CARDIA)    Difficulty of Paying Living Expenses: Not hard at all  Food Insecurity: No Food Insecurity (08/01/2023)   Received from [facility]   Hunger Vital Sign    Within the past 12 months, you worried that your food would run out before you got the money to buy more.: Never true    Within the past 12 months, the food you bought just didn't last and you didn't have money to get more.: Never true  Transportation Needs: No Transportation Needs (  08/01/2023)   Received from [facility]   PRAPARE - Transportation    In the past 12 months, has lack of transportation kept you from medical appointments or from getting medications?: No    Lack of Transportation (Non-Medical): No  Physical Activity: Not on file  Stress: Not on file  Social Connections: Not on file  Intimate Partner Violence: Not on file    Family History:    Family History  Problem Relation Age of Onset   Allergic rhinitis Father    Allergic rhinitis Sister    Allergic rhinitis Brother    Breast cancer Neg Hx      ROS:  Please see the history of present illness.   All other ROS reviewed and negative.     Physical Exam/Data: Vitals:   06/23/24 1045 06/23/24 1046 06/23/24 1048 06/23/24 1053  BP: (!) 148/95   (!) 148/95  Pulse:    88  Resp: 16   17  Temp:    98.2 F (36.8 C)  TempSrc:    Oral  SpO2:  100%  100%  Weight:   63.5 kg   Height:   5' 4 (1.626 m)     Intake/Output Summary (Last 24 hours) at 06/23/2024 1334 Last data filed at 06/23/2024 1046 Gross per 24 hour  Intake 0 ml  Output --  Net 0 ml      06/23/2024   10:48 AM 06/19/2024    9:09 AM 06/18/2024   12:28 PM  Last 3 Weights  Weight (lbs) 140 lb 140 lb 140 lb  Weight (kg) 63.504 kg 63.504 kg 63.504  kg     Body mass index is 24.03 kg/m.  General:  Well nourished, well developed, in no acute distress. Alert and orientated. HEENT: normal Neck: no JVD Vascular: No carotid bruits; Distal pulses 2+ bilaterally Cardiac:  normal S1, S2; RRR; no murmur Lungs:  clear to auscultation bilaterally, no wheezing, rhonchi or rales  Abd: soft, nontender, no hepatomegaly  Ext: no edema Musculoskeletal:  No deformities. Skin: warm and dry  Neuro:  no focal abnormalities noted Psych:  Normal affect   EKG:  The EKG was personally reviewed and demonstrates:  sinus rhythm with a rate of 86 and a new left bundle branch block since most recent EKG on 04/18/2024. LBBB was present on EKG on 2020. Telemetry:  Telemetry was personally reviewed and demonstrates:  Normal sinus rhythm with heart rates in the 80's and 90's.  Relevant CV Studies: Echo pending  Laboratory Data: High Sensitivity Troponin:   Recent Labs  Lab 06/23/24 1100  TROPONINIHS 10     Chemistry Recent Labs  Lab 06/23/24 1100 06/23/24 1105  NA SPECIMEN HEMOLYZED. HEMOLYSIS MAY AFFECT INTEGRITY OF RESULTS. 139  K SPECIMEN HEMOLYZED. HEMOLYSIS MAY AFFECT INTEGRITY OF RESULTS. 4.2  CL SPECIMEN HEMOLYZED. HEMOLYSIS MAY AFFECT INTEGRITY OF RESULTS. 97*  CO2 SPECIMEN HEMOLYZED. HEMOLYSIS MAY AFFECT INTEGRITY OF RESULTS.  --   GLUCOSE SPECIMEN HEMOLYZED. HEMOLYSIS MAY AFFECT INTEGRITY OF RESULTS. 103*  BUN SPECIMEN HEMOLYZED. HEMOLYSIS MAY AFFECT INTEGRITY OF RESULTS. 24*  CREATININE SPECIMEN HEMOLYZED. HEMOLYSIS MAY AFFECT INTEGRITY OF RESULTS. 1.00  CALCIUM  SPECIMEN HEMOLYZED. HEMOLYSIS MAY AFFECT INTEGRITY OF RESULTS.  --     No results for input(s): PROT, ALBUMIN, AST, ALT, ALKPHOS, BILITOT in the last 168 hours. Lipids No results for input(s): CHOL, TRIG, HDL, LABVLDL, LDLCALC, CHOLHDL in the last 168 hours.  Hematology Recent Labs  Lab 06/23/24 1100 06/23/24 1105  WBC 9.8  --  RBC 4.55  --   HGB 13.8  14.3  HCT 41.3 42.0  MCV 90.8  --   MCH 30.3  --   MCHC 33.4  --   RDW 12.9  --   PLT 274  --    Thyroid No results for input(s): TSH, FREET4 in the last 168 hours.  BNPNo results for input(s): BNP, PROBNP in the last 168 hours.  DDimer No results for input(s): DDIMER in the last 168 hours.  Radiology/Studies:  DG Chest 1 View Result Date: 06/23/2024 CLINICAL DATA:  Chest pain EXAM: CHEST  1 VIEW COMPARISON:  Chest radiograph dated 06/09/2020 FINDINGS: Normal lung volumes. No focal consolidations. No pleural effusion or pneumothorax. The heart size and mediastinal contours are within normal limits. No acute osseous abnormality. Left shoulder arthroplasty hardware appears intact. Bilateral breast implants. IMPRESSION: No active disease. Electronically Signed   By: Limin  Xu M.D.   On: 06/23/2024 11:30     Assessment and Plan: CARMEL GARFIELD is a 78 y.o. female with a hx of osteoporosis, hypertension, aortic atherosclerosis, left total shoulder replacement on 04/2018, and right total knee arthroplasty on 04/22/2024 with manipulation on 06/19/2024  who is being seen 06/23/2024 for the evaluation of chest pain at the request of Duwaine Fusi DO.  Right total knee arthroplasty on 04/22/2024 with manipulation on 06/19/2024 Management per orthopedic surgery   Intermittent Left bundle branch block Coronary artery calcification Denies chest pain, new neck pain, new back pain, shortness of breath, diaphoresis, and lower extremity edema.  Has a left shoulder pain that is worse when the patient lays on her shoulder.  Denies that the pain is worse when walking.  Had a prior left total shoulder replacement on 04/2018. EKG showed sinus rhythm with a rate of 86 and a new left bundle branch block since most recent EKG on 04/18/2024. Had a prior LBBB in 2020. high-sensitivity troponin negative at 10. Order Echo. No ischemic evaluation is necessary at this time as does not have chest pain or shortness  of breath and has had LBBB on prior EKG in 2020.  Continue aspirin  81mg  daily. Continue Zetia  10mg  daily.   Lightheadedness  Reported that she had nausea and vomiting on Friday.  Has also had right headedness and dizziness. Has also had several episodes of presyncope as well.  Reported poor oral intake.  Suspect that lightheadedness is likely from dehydration. If she has continued lightheadedness after being rehydrated it is reasonable to consider a cardiac monitor.   Hypertension Blood pressures have been high in the emergency department.  Recommend patient continue to check blood pressure at home. Continue home chlorthalidone .     Risk Assessment/Risk Scores:            For questions or updates, please contact [facility] Please consult [email or web address] for contact info under     Signed, Morse Clause, PA-C  06/23/2024 1:34 PM

## 2024-06-23 NOTE — Consult Note (Signed)
 Hospital Consult Note Service Pager: [phone]  Patient name: Brooke Cantrell Medical record number: [id number] Date of Birth: August 01, 1946 Age: 78 y.o. Gender: female  Primary Care Provider: Alla Amis, MD Consultants: Cardiology Code Status: Full Code Preferred Emergency Contact:  Contact Information     Name Relation Home Work Mobile   Stiggers,Timothy Spouse [phone]  [phone]      Other Contacts     Name Relation Home Work Mobile   [place] Daughter   [phone]        Chief Complaint: Nausea, dizziness  Assessment and Plan: KAMELIA LAMPKINS is a 78 y.o. female presenting with new nausea and dizziness following general rule anesthesia for knee manipulation. Differential for presentation of this includes conditions listed below: Vestibular neuritis-supported by positive head impulse and normal nystagmus and test of skew exams.  Also has negative neurologic exam and no focal weakness.  Vomiting is a common occurrence for her following anesthesia, no other concerning findings. Cardiac conduction abnormality/left bundle branch block-less likely to be the cause given the LBBB which has been seen on her EKG today has been intermittently present since 2020 Central vertigo-less likely given negative HINTS exam nonfocal neurologic exam.  Patient also denies any other red flag signs such as headache or loss of consciousness. PE- least likely given patient's lack of shortness of breath, chest pain and lack of tachycardia.  Given recent manipulation, low suspicion that patient's knee swelling is related to clot.  Patient's Wells score is 1.5 which puts her in the low risk group.  Assessment & Plan Dizziness Most likely secondary to vestibular neuritis given exam findings.  Recommendations as below, no need for admission at this time. -RT-PCR for flu COVID RSV to assess for any underlying viral cause -Recommend patient follow-up with PT outpatient for further  treatment/management -Return precautions discussed with the patient, including but not limited to: New chest pain, loss of consciousness worsening in dizziness, new focal neurological signs such as drooping of the face, loss of speech ability, or changes in balance or motor function -Patient may follow-up with her primary care doctor Left bundle branch block Chronic and intermittent since at least 2020.  Cardiology was consulted and saw her in the emergency department and recommend outpatient follow-up please see their note for full recommendations. - Follow-up second troponin, patient may discharge if negative.  Disposition: Discharge home with outpatient follow-up  History of Present Illness:  Brooke Cantrell is a 78 y.o. female presenting with dizziness and feeling of malaise since last Thursday. Initially attributed this to having her knee manipulated, but the feeling has persisted.  She has a history of prolonged nausea and vomiting with anesthesia, reporting that she has experienced at least a week of symptoms similar to this with each instance of anesthesia she has undergone.  She does however state that she has never had dizziness or the sensation of lightheadedness prior.  She denies any blood or bilious materials in her vomiting, she describes the feeling as lightheadedness, like she might pass out but denies any loss of consciousness.  She has no new feelings of weakness on related to her recent knee surgery.  She denies any visual disturbances.  She does report that on Saturday following her knee manipulation her knee may have been more swollen than it was previously however it has responded appropriately to ice and rest.  In the ED, CBC and BMP were unremarkable, chest x-ray showed no intra thoracic cardiopulmonary disease.  Initial troponin  was 10, repeat pending.  Cardiology was consulted for evaluation of what was thought to be new left bundle branch block.  Review Of Systems: Per HPI    Pertinent Past Medical History: Breast cancer Ectopic pregnancy Hypertension Hypercholesterolemia  Remainder reviewed in history tab.   Pertinent Past Surgical History: Knee and shoulder replacement Tympanostomy Bilateral tubal ligation Breast reconstruction  Remainder reviewed in history tab.  Pertinent Social History: Tobacco use: Former, quit 30-45 years ago Alcohol use: occasional Other Substance use: none Lives with husband  Pertinent Family History: Lung Cancer- Grandfather Throat Cancer- Mother MI- died at 47 years- father  Remainder reviewed in history tab.   Important Outpatient Medications: Zetia  Chlorthalidone   Remainder reviewed in medication history.   Objective: BP (!) 148/95 (BP Location: Right Arm)   Pulse 88   Temp 98.2 F (36.8 C) (Oral)   Resp 17   Ht 5' 4 (1.626 m)   Wt 63.5 kg   SpO2 100%   BMI 24.03 kg/m  Exam: General: Well-appearing elderly female, no distress Neck: Supple, nontender Cardiovascular: RRR, no M/R/G Respiratory: CTAB, no increased work of breathing Gastrointestinal: Flat, soft, nontender MSK: 2+ peripheral pulses in all 4 extremities, minimally enlarged right knee when compared with the left side of surgery is well-healed, no increased erythema or warmth to operative knee. Neuro: Memory: Intact . PEERLA.  Cranial nerves: II through XII are intact. Normal visual fields bilaterally.  Sensation normal upper and lower ext's bilaterally. Strength 5/5 in upper and lower ext's bilaterally.   Head impulse: Loss of focus with unidirectional head impulse  Nystagmus: Negative  Test of skew: Negative  Labs:  CBC BMET  Recent Labs  Lab 06/23/24 1100 06/23/24 1105  WBC 9.8  --   HGB 13.8 14.3  HCT 41.3 42.0  PLT 274  --    Recent Labs  Lab 06/23/24 1100 06/23/24 1105  NA SPECIMEN HEMOLYZED. HEMOLYSIS MAY AFFECT INTEGRITY OF RESULTS. 139  K SPECIMEN HEMOLYZED. HEMOLYSIS MAY AFFECT INTEGRITY OF RESULTS. 4.2  CL  SPECIMEN HEMOLYZED. HEMOLYSIS MAY AFFECT INTEGRITY OF RESULTS. 97*  CO2 SPECIMEN HEMOLYZED. HEMOLYSIS MAY AFFECT INTEGRITY OF RESULTS.  --   BUN SPECIMEN HEMOLYZED. HEMOLYSIS MAY AFFECT INTEGRITY OF RESULTS. 24*  CREATININE SPECIMEN HEMOLYZED. HEMOLYSIS MAY AFFECT INTEGRITY OF RESULTS. 1.00  GLUCOSE SPECIMEN HEMOLYZED. HEMOLYSIS MAY AFFECT INTEGRITY OF RESULTS. 103*  CALCIUM  SPECIMEN HEMOLYZED. HEMOLYSIS MAY AFFECT INTEGRITY OF RESULTS.  --     Troponin-10, repeat pending   EKG: Wide QRS complex, sinus rhythm.  Consistent with left bundle branch block.   Imaging Studies Performed:  EXAM: CHEST  1 VIEW   COMPARISON:  Chest radiograph dated 06/09/2020   FINDINGS: Normal lung volumes. No focal consolidations. No pleural effusion or pneumothorax. The heart size and mediastinal contours are within normal limits. No acute osseous abnormality. Left shoulder arthroplasty hardware appears intact. Bilateral breast implants.   IMPRESSION: No active disease.  Thank you for this consult opportunity.  Cleotilde Lukes, DO 06/23/2024, 1:07 PM PGY-2, [facility]  FPTS Intern pager: [phone], text pages welcome Secure chat group [facility] Teaching Service

## 2024-06-23 NOTE — Assessment & Plan Note (Signed)
 Chronic and intermittent since at least 2020.  Cardiology was consulted and saw her in the emergency department and recommend outpatient follow-up please see their note for full recommendations. - Follow-up second troponin, patient may discharge if negative.

## 2024-06-23 NOTE — Addendum Note (Signed)
 Addended by: Clotee Schlicker on: 06/23/2024 02:05 PM   Modules accepted: Orders

## 2024-06-23 NOTE — Progress Notes (Signed)
 Echocardiogram for intermittent LBBB Dr Swaziland to read.

## 2024-06-23 NOTE — Assessment & Plan Note (Signed)
 Most likely secondary to vestibular neuritis given exam findings.  Recommendations as below, no need for admission at this time. -RT-PCR for flu COVID RSV to assess for any underlying viral cause -Recommend patient follow-up with PT outpatient for further treatment/management -Return precautions discussed with the patient, including but not limited to: New chest pain, loss of consciousness worsening in dizziness, new focal neurological signs such as drooping of the face, loss of speech ability, or changes in balance or motor function -Patient may follow-up with her primary care doctor

## 2024-06-23 NOTE — Discharge Instructions (Signed)
 You can take your meclizine as needed for dizziness.  Make sure you call and follow-up with your primary care doctor and cardiology.  You can also use Zofran  as needed for nausea and vomiting.  Return to the ER for new or worsening symptoms.

## 2024-06-23 NOTE — ED Notes (Signed)
Patient is back in bed on the monitor with family at bedside and call bell in reach  

## 2024-06-23 NOTE — ED Notes (Signed)
Walked patient to the bathroom patient did well 

## 2024-06-23 NOTE — ED Provider Notes (Addendum)
 [place] EMERGENCY DEPARTMENT AT [facility] Provider Note   CSN: [id number] Arrival date & time: 06/23/24  1042     Patient presents with: Dizziness   Brooke Cantrell is a 78 y.o. female.   78 year old female presents for evaluation of dizziness.  She states has been going on off and on since Friday.  States she was at physical therapy today and felt really bad.  States she was unable to do the exercises.  States has also been very nauseous and did have some vomiting on Friday.  Admits to left shoulder pain but states this is chronic.  Also having worse neck pain than usual, but also has some chronic neck pain.  She denies any shortness of breath.  Denies any other symptoms or concerns at this time.  Does admit to current nausea and dizziness.   Dizziness Associated symptoms: nausea and vomiting   Associated symptoms: no chest pain, no palpitations and no shortness of breath        Prior to Admission medications   Medication Sig Start Date End Date Taking? Authorizing Provider  meclizine (ANTIVERT) 25 MG tablet Take 1 tablet (25 mg total) by mouth 3 (three) times daily as needed for up to 7 days for dizziness. 06/23/24 06/30/24 Yes Jaycelyn Orrison L, DO  ondansetron  (ZOFRAN ) 4 MG tablet Take 1 tablet (4 mg total) by mouth every 8 (eight) hours as needed for up to 4 days. 06/23/24 06/27/24 Yes Benn Tarver L, DO  aspirin  81 MG chewable tablet Chew 1 tablet (81 mg total) by mouth 2 (two) times daily. Patient not taking: Reported on 06/17/2024 04/23/24   Vernetta Lonni GRADE, MD  calcium  citrate-vitamin D  (CITRACAL+D) 315-200 MG-UNIT tablet Take 2 tablets by mouth 2 (two) times daily with a meal.    [provider]  chlorthalidone  (HYGROTON ) 25 MG tablet Take 25 mg by mouth daily.    [provider]  cholecalciferol  (VITAMIN D3) 25 MCG (1000 UNIT) tablet Take 1,000 Units by mouth in the morning and at bedtime.    [provider]  D-Mannose  (MANNXTRA PO) Take 1 capsule by mouth daily.    [provider]  ezetimibe  (ZETIA ) 10 MG tablet Take 10 mg by mouth daily.    [provider]  methocarbamol  (ROBAXIN ) 500 MG tablet Take 1 tablet (500 mg total) by mouth every 6 (six) hours as needed for muscle spasms. Patient not taking: Reported on 06/17/2024 05/03/24   Vernetta Lonni GRADE, MD  Multiple Vitamin (MULTIVITAMIN) capsule Take 1 capsule by mouth daily.    [provider]  nitrofurantoin , macrocrystal-monohydrate, (MACROBID ) 100 MG capsule TAKE 1 CAPSULE BY MOUTH TWICE A DAY Patient not taking: Reported on 06/17/2024 05/14/24   Vernetta Lonni GRADE, MD  traMADol  (ULTRAM ) 50 MG tablet Take 1-2 tablets (50-100 mg total) by mouth every 6 (six) hours as needed. 06/19/24   Vernetta Lonni GRADE, MD    Allergies: Ciprofloxacin and Oxycodone     Review of Systems  Constitutional:  Negative for chills and fever.  HENT:  Negative for ear pain and sore throat.   Eyes:  Negative for pain and visual disturbance.  Respiratory:  Negative for cough and shortness of breath.   Cardiovascular:  Negative for chest pain and palpitations.  Gastrointestinal:  Positive for nausea and vomiting. Negative for abdominal pain.  Genitourinary:  Negative for dysuria and hematuria.  Musculoskeletal:  Negative for arthralgias and back pain.  Skin:  Negative for color change and rash.  Neurological:  Positive for dizziness. Negative for seizures and syncope.  All other systems reviewed and are negative.   Updated Vital Signs BP (!) 146/89 (BP Location: Right Arm)   Pulse 84   Temp 98.2 F (36.8 C) (Oral)   Resp (!) 23   Ht 5' 4 (1.626 m)   Wt 63.5 kg   SpO2 100%   BMI 24.03 kg/m   Physical Exam Vitals and nursing note reviewed.  Constitutional:      General: She is not in acute distress.    Appearance: Normal appearance. She is well-developed. She is ill-appearing.  HENT:     Head: Normocephalic and atraumatic.   Eyes:     Conjunctiva/sclera: Conjunctivae normal.  Cardiovascular:     Rate and Rhythm: Normal rate and regular rhythm.     Heart sounds: No murmur heard. Pulmonary:     Effort: Pulmonary effort is normal. No respiratory distress.     Breath sounds: Normal breath sounds.  Abdominal:     Palpations: Abdomen is soft.     Tenderness: There is no abdominal tenderness.  Musculoskeletal:        General: No swelling.     Cervical back: Neck supple.  Skin:    General: Skin is warm and dry.     Capillary Refill: Capillary refill takes less than 2 seconds.  Neurological:     Mental Status: She is alert.  Psychiatric:        Mood and Affect: Mood normal.     (all labs ordered are listed, but only abnormal results are displayed) Labs Reviewed  CBC WITH DIFFERENTIAL/PLATELET - Abnormal; Notable for the following components:      Result Value   Eosinophils Absolute 0.7 (*)    All other components within normal limits  I-STAT CHEM 8, ED - Abnormal; Notable for the following components:   Chloride 97 (*)    BUN 24 (*)    Glucose, Bld 103 (*)    All other components within normal limits  RESP PANEL BY RT-PCR (RSV, FLU A&B, COVID)  [id number]  BASIC METABOLIC PANEL WITH GFR  BASIC METABOLIC PANEL WITH GFR  TROPONIN I (HIGH SENSITIVITY)  TROPONIN I (HIGH SENSITIVITY)    EKG: EKG Interpretation Date/Time:  Monday June 23 2024 10:46:56 EDT Ventricular Rate:  86 PR Interval:  156 QRS Duration:  166 QT Interval:  434 QTC Calculation: 520 R Axis:   -32  Text Interpretation: Sinus rhythm Ventricular premature complex Left bundle branch block is new Compared with prior EKG from 04/18/2024 Confirmed by Gennaro Bouchard ([id number]) on 06/23/2024 10:51:37 AM  Radiology: [facility] Chest 1 View Result Date: 06/23/2024 CLINICAL DATA:  Chest pain EXAM: CHEST  1 VIEW COMPARISON:  Chest radiograph dated 06/09/2020 FINDINGS: Normal lung volumes. No focal consolidations. No pleural effusion or pneumothorax.  The heart size and mediastinal contours are within normal limits. No acute osseous abnormality. Left shoulder arthroplasty hardware appears intact. Bilateral breast implants. IMPRESSION: No active disease. Electronically Signed   By: Limin  Xu M.D.   On: 06/23/2024 11:30     Procedures   Medications Ordered in the ED  ondansetron  (ZOFRAN ) injection 4 mg (4 mg Intravenous Given 06/23/24 1108)  lactated ringers  bolus 500 mL (0 mLs Intravenous Stopped 06/23/24 1429)                                    Medical Decision Making Cardiac  monitor interpretation: Sinus rhythm, no ectopy  Patient here for dizziness and nausea that happened at therapy today.  She is having minimal right leg pain after her surgery last week.  Is complaining of some shoulder pain as well as lightheadedness and nausea.  She looks uncomfortable on exam.  States she has had fluctuations in her blood pressure as well.  EKG reveals new left bundle branch block but labs are unremarkable and troponin is negative.  Cardiology consulted and they will plan to come see the patient and so will family medicine teaching service.   Second troponin is flat.  Cardiology and Family medicine teaching service evaluated and they both feel that this is likely more vestibular neuritis related and patient symptoms have resolved at this time.  They think she is stable for outpatient follow-up.  Patient and family agreeable with plan.  Vitals are stable.  All results were discussed with them and she is given strict return precautions and advised to return for new or worsening symptoms.  Problems Addressed: Dizziness: acute illness or injury Left bundle branch block: undiagnosed new problem with uncertain prognosis Nausea: acute illness or injury  Amount and/or Complexity of Data Reviewed External Data Reviewed: notes.    Details: Prior surgical records reviewed from a week ago patient had an uncomplicated surgery for her right knee for  arthritis Labs: ordered. Decision-making details documented in ED Course.    Details: Ordered and reviewed by me and unremarkable Radiology: ordered and independent interpretation performed. Decision-making details documented in ED Course.    Details: Chest x-ray ordered and inter by me independently of radiology and shows no acute abnormality in the chest ECG/medicine tests: ordered and independent interpretation performed. Decision-making details documented in ED Course.    Details: Ordered and interpreted by me in the absence of cardiology but discussed with cardiology and shows new left bundle branch block but no STEMI, does not meet Sgarbossa criteria Discussion of management or test interpretation with external provider(s): On-call provider for cardiology-I spoke with her on the phone and she states they will evaluate and consult on the patient but no acute intervention at this time  Hospitalist-I spoke with family medicine teaching service attending and they will evaluate the patient and admit to their service for further workup and management  Risk OTC drugs. Prescription drug management. Drug therapy requiring intensive monitoring for toxicity. Decision regarding hospitalization.     Final diagnoses:  Left bundle branch block  Dizziness  Nausea    ED Discharge Orders          Ordered    meclizine (ANTIVERT) 25 MG tablet  3 times daily PRN        06/23/24 1504    ondansetron  (ZOFRAN ) 4 MG tablet  Every 8 hours PRN        06/23/24 1505               Kemoni Quesenberry L, DO 06/23/24 1241    Lucita Montoya L, DO 06/23/24 1506

## 2024-06-23 NOTE — ED Triage Notes (Signed)
 Pt BIB EMS from physical therapy appt R knee replacement x 2 months ago, Fri after PT was nausea/vomited/dizziness has been experiencing these s/s since Fri. PT called EMS today while at appt. Pt c/o L sided shoulder blade pain. Abnormal EKG en route.   156/88 88 100% RA  CBG 150  20G RAC

## 2024-06-24 ENCOUNTER — Encounter: Admitting: Physical Therapy

## 2024-06-24 NOTE — Therapy (Incomplete)
 OUTPATIENT PHYSICAL THERAPY TREATMENT NOTE   Patient Name: Brooke Cantrell MRN: [id number] DOB:[birth year], 78 y.o., female Today's Date: 06/24/2024       END OF SESSION:        Past Medical History:  Diagnosis Date   Aortic atherosclerosis 05/2024   Arthritis    Breast cancer (HCC)    Left breast   Depression    mild, no meds   History of kidney stones    passed the stone   HLD (hyperlipidemia)    Hypertension    Osteopenia 05/2024   Past Surgical History:  Procedure Laterality Date   breast cancer surgery     BREAST ENHANCEMENT SURGERY     BREAST LUMPECTOMY Left    CATARACT EXTRACTION, BILATERAL     ECTOPIC PREGNANCY SURGERY     EXAM UNDER ANESTHESIA WITH MANIPULATION OF KNEE Right 06/19/2024   Procedure: MANIPULATION, JOINT, KNEE, WITH ANESTHESIA;  Surgeon: Vernetta Lonni GRADE, MD;  Location: [facility] OR;  Service: Orthopedics;  Laterality: Right;  RIGHT KNEE MANIPULATION UNDER ANESTHESIA   EYE SURGERY     INNER EAR SURGERY     TONSILLECTOMY     TOTAL KNEE ARTHROPLASTY Right 04/22/2024   Procedure: ARTHROPLASTY, KNEE, TOTAL;  Surgeon: Vernetta Lonni GRADE, MD;  Location: [facility] OR;  Service: Orthopedics;  Laterality: Right;   TOTAL SHOULDER ARTHROPLASTY Left 04/25/2018   TOTAL SHOULDER ARTHROPLASTY Left 04/25/2018   Procedure: LEFT TOTAL SHOULDER REPLACEMENT;  Surgeon: Addie Cordella Hamilton, MD;  Location[facility];  Service: Orthopedics;  Laterality: Left;   TUBAL LIGATION     Patient Active Problem List   Diagnosis Date Noted   Left bundle branch block 06/23/2024   Dizziness 06/23/2024   Coronary artery calcification 06/23/2024   Arthrofibrosis of knee joint, right 06/18/2024   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   Weakness    Hyperlipidemia    Acute respiratory failure due to COVID-19 (HCC) 06/10/2020   Pneumonia due to COVID-19 virus 06/10/2020   Hx of multiple pulmonary nodules 06/10/2020   Acute respiratory failure with hypoxia (HCC)     Lung nodule seen on imaging study 01/08/2020   History of breast cancer 04/14/2019   Sepsis (HCC) 04/02/2019   CAP (community acquired pneumonia) 04/02/2019   Elevated troponin 04/02/2019   Shoulder arthritis 04/25/2018   Primary osteoarthritis, 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  Rationale for Evaluation and Treatment: Rehabilitation  ONSET DATE: 04/22/2024  SUBJECTIVE:   SUBJECTIVE STATEMENT: *** Pt reports feeling ill for the last few days.  I feel a little dizzy. Minimal to no pain reported.  Not taking prescribed pain meds only Tylenol .  PERTINENT HISTORY: Lt breast cancer, kidney stones, HTN, Lt TSA 2019  PAIN:  Are you having pain? Yes: NPRS scale: 0/10 Location: Rt knee Pain description: Stiff, achy, occasional sharp Aggravating factors: Prolonged postures and too much WB Relieving factors: Pain meds, ice, change positions frequently  PRECAUTIONS: None  RED FLAGS: None   WEIGHT BEARING RESTRICTIONS: No  FALLS:  Has patient fallen in last 6 months? No  LIVING ENVIRONMENT: Lives with: lives with their family and grandson Lives in: House/apartment  Stairs: Uses the rail with steps Has following equipment at home: Single point cane and Walker - 2 wheeled  OCCUPATION: Retired  PLOF: Independent  PATIENT GOALS: Return to gardening her acre lot, weed, plant in the garden    OBJECTIVE:  Note: Objective measures were completed at Evaluation unless otherwise noted.  DIAGNOSTIC FINDINGS: Right knee arthroplasty in expected alignment. No periprosthetic lucency or fracture. There has been  patellar resurfacing. Recent postsurgical change includes air and edema in the soft tissues and joint space. Anterior skin staples in place.  PATIENT SURVEYS:  PSFS: THE PATIENT SPECIFIC FUNCTIONAL SCALE  Place score of 0-10 (0 = unable to perform activity and 10 = able to perform activity at the same level as before injury or problem)  Activity Date:  05/09/2024 06/02/2024 06/19/2024 (Unable to assess this date)  Walk 2 5   2.  Garden 0 0   3.  Safely transfer to and from the ground 0 0   4.      Total Score 0.67 1.67     Total Score = Sum of activity scores/number of activities  Minimally Detectable Change: 3 points (for single activity); 2 points (for average score)  [name] (nd). The Patient Specific Functional Scale . Retrieved from [email or web address]   COGNITION: Overall cognitive status: Within functional limits for tasks assessed     SENSATION: WFL  EDEMA:  Noted and not objectively assessed  LOWER EXTREMITY ROM:   ROM Left/Right 05/09/2024 Rt 05/13/24 Right 05/20/24 Right 05/21/24 Right 05/26/24 Right 05/28/24 Rt 06/02/2024 Right 06/04/24 Right 06/06/2024 Rt 06/09/2024 Right 06/13/2024 Rt 06/19/2024  Right 06/20/24  Knee flexion 145/52 Supine:  A:  P:  Sitting:  P: 70 Supine AA: 76* Seated P: 78* Standing  P: flexion stretch 82* Seated P: 87* Supine  A: 76deg A: 89  P: 95 Active 93 AROM: 87 PROM: 91 Active 97 AROM: 109deg A: 115  Knee extension 0/7     Seated P: -12*  LAQ Supine:  A: 6deg of flexion A: -11 (seated LAQ) Active lacks 5 AROM: 10deg flexion PROM: 8deg flexion  Active 5 AROM: 3deg PROM: 2deg      (Blank rows = not tested)  LOWER EXTREMITY STRENGTH:  In pounds with hand-held dynamometer Left/Right 05/09/2024 Right 06/04/24  Knee flexion    Knee extension 49.7/5.9 pounds 27.8#   (Blank rows = not tested)  GAIT: Distance walked: 50 feet Assistive device utilized: Walker  - 2 wheeled Level of assistance: Modified independence Comments: Brooke Cantrell is using her walker full-time at this point   TREATMENT DATE:  06/24/24 ***    06/23/24 R Knee   Pt had Knee manipulation on 06/19/24.  She had PT on 06/19/24 and 06/20/24.  She states after PT on 10/10 she vomited in the bathroom.  She entered clinic this morning and states she has felt dizzy and  nauseous all weekend.  Still felt ill.  I checked her BP in sitting and it was 131/111.  She was placed in supine to rest. Pulse Ox was 92 and dropped to 88 at one point.  Inconsistent wavelength of patient noted on Pulse OX.  Nurse consulted and Tory Gaskins PA came in and agreed with calling EMS.  Pt was taken via EMS to ED. Self Care - Pt educated on signs and symptoms of high BP, heart attack.  Instructed in deep breathing to increase oxygen levels.     06/20/24 TherAct Recumbent bike L3 seat 5-3 x 8 min  Leg press bil 100# 3x10; then RLE 50# 2x10; 20 sec flexion holds between sets Floor transfers modi I with support Bridging 2x10; 5 sec hold AA heel slides 2x10; 5 sec hold  06/19/2024 TherEx:  UBE with bilat UE and LE, level 1.0, seat 10 for 3 minutes, seat 7 for 5 minutes  Bilat leg press 3x15 with 75#  Seated LAQ, 1x10 with no weight, 2x10 with 1.5# ankle weight  Supine quad sets with towel under ankle and PT overpressure into extension, 1x10 with 5s hold  Heel slides 1x10  PT discussed importance of continuing seated AAROM knee flexion, heel slides, and quad sets while at home  Manual:  Seated AROM knee flexion with PT providing IR/distraction and overpressure into knee flexion, 1x5 with 5s hold and PT providing PROM into knee extension for 5s; 1x5 with 10s hold into knee flexion with PT then providing PROM knee extension for 10s   Vaso:  Rt LE elevated on wedge for 10 minutes with medium compression at 34deg   06/17/2024 Therapeutic Exercise: precor bike seat 6 flexion stretch for 5 min, backwards full  revolutions 3 min, forward full revolutions 2 min Gastroc stretch step heel depression 30 sec 3 reps and 10 reps heel raises bw each rep. Hamstring stretch RLE long sit strap DF 30 sec 3 reps Quad stretch hooklying RLE over edge strap flex 30 sec 3 reps Supine gravity assisted (femur vertical) flexion 5 sec 10 reps  Bridge 10 reps with RLE foot moving closer on rep 3, 6, 9 with 5 sec hold flexion stretch and lift. Quad set on towel roll 5 sec hold 15 reps PT reviewed need to exercise frequently (at least 5 min of each awake hour).  Exercises should include flexibility, strength, balance and muscle endurance.  Walking out 3 min & back 3 min. Then progress 1 min each as tolerated. Pt verbalized understanding.  PT updated HEP with HO and reviewed with demo & verbal cues.  Pt verbalized understanding.   Neuromuscular Re-education Tandem stance 1st rep of each set RLE in front & 2nd rep in back;  1st set floor eyes open, 2nd set floor eyes closed intermittent touch, 3rd set foam eyes open intermittent touch  Vaso RLE knee elevation medium compression 34*  10 min with ext stretch    PATIENT EDUCATION:  Education details: See above Person educated: Patient Education method: Explanation, Demonstration, Tactile cues, Verbal cues, and Handouts Education comprehension: verbalized understanding, returned demonstration, verbal cues required, tactile cues required, and needs further education  HOME EXERCISE PROGRAM: Access Code: [email or web address] URL: [email or web address] Access Code: [email or web address] URL: [email or web address] Date: 06/16/2024 Prepared by: Grayce Spatz  Exercises - Seated Knee Flexion AAROM  - 5 x daily - 7 x weekly - 1 sets - 10 reps - 10 seconds hold - Supine Quadricep Sets  - 10 x daily - 7 x weekly - 10 sets - 10 reps - 5 second hold - Seated Knee Flexion Extension AROM   - 2-4 x daily - 7 x weekly - 2-3 sets - 10 reps - 5 seconds hold - Seated Knee Flexion  Extension AAROM with Overpressure  - 2-4 x daily - 7 x weekly - 2-3 sets - 10 reps - 5 seconds hold - Seated Long Arc Quad  - 2-4 x daily - 7 x weekly - 2-3 sets - 10 reps - 5 seconds hold - Supine Heel Slide with Strap  - 2-4 x daily - 7 x weekly - 2-3 sets -  10 reps - 5 seconds hold - Standing Knee Flexion Stretch on Step  - 2-3 x daily - 7 x weekly - 3-5 sets - 3 reps - 10 seconds hold - Gastroc Stretch on Step  - 1-3 x daily - 7 x weekly - 1 sets - 3 reps - 30 seconds hold - Standing Heel Raise  - 1-2 x daily - 7 x weekly - 2-3 sets - 10 reps - 2-3 seconds hold - Tandem Stance  - 1 x daily - 7 x weekly - 3 sets - 2 reps - 30 seconds hold - Seated Table Hamstring Stretch  - 1-3 x daily - 7 x weekly - 1 sets - 3 reps - 30 seconds hold - Seated Hamstring Stretch with Strap  - 1-2 x daily - 7 x weekly - 1 sets - 3 reps - 30 seconds hold - Seated Passive Knee Extension  - 2 x daily - 7 x weekly - 3-5 minutes hold - Prone Knee Extension with Ankle Weight  - 2 x daily - 7 x weekly - 3-5 minute hold - Supine Quadriceps Stretch with Strap on Table  - 1-3 x daily - 7 x weekly - 1 sets - 3 reps - 30 seconds hold  ASSESSMENT:  CLINICAL IMPRESSION: *** See treatment note- pt taken via EMS to ED due to symptoms noted.  OBJECTIVE IMPAIRMENTS: Abnormal gait, decreased activity tolerance, decreased endurance, decreased knowledge of condition, difficulty walking, decreased ROM, decreased strength, increased edema, and pain.   ACTIVITY LIMITATIONS: bending, sitting, standing, squatting, sleeping, stairs, transfers, bed mobility, dressing, and locomotion level  PARTICIPATION LIMITATIONS: community activity and yard work  PERSONAL FACTORS: Lt breast cancer, kidney stones, HTN, Lt TSA 2019 are also affecting patient's functional outcome.   REHAB POTENTIAL: Good  CLINICAL DECISION MAKING: Stable/uncomplicated  EVALUATION COMPLEXITY: Low   GOALS: Goals reviewed with patient? Yes  SHORT TERM GOALS:  Target date: 06/20/2024  Brooke Cantrell will be independent with her day 1 home exercise program Baseline: Started 05/09/2024 Goal status:   Met 06/06/2024  2.  Improve right knee active range of motion to 3 - 0 -95 degrees Baseline: 7 - 0 - 52 degrees Goal status: Partially Met 06/13/2024  3.  Improve right quadriceps strength to at least 30 pounds Baseline: 5.9 pounds Goal status: ONGOING      06/16/2024   LONG TERM GOALS: Target date: 08/01/2024  Improve patient specific functional score to at least 5 Baseline: 0.67 Goal status: ongoing, 06/19/2024  2.  Brooke Cantrell will report right knee pain consistently 3/10 or better on the numeric pain rating scale Baseline: Can be as high as 8/10 Goal status: ongoing, 06/19/2024  3.  Improve right knee AROM to 0-2-110 degrees (updated, 06/19/2024) Baseline: 0-2-109 06/19/2024 Goal status: ongoing, 06/19/2024  4.  Improve bilateral quadriceps strength to 60 pounds or better Baseline: See objective Goal status: ongoing, 06/19/2024  5.  Brooke Cantrell will be comfortable without the use of an assistive device within the house and with no more than a single-point cane outside the house at transfer into independent rehabilitation Baseline: 2 wheeled walker full-time Goal status:  Met 06/13/2024  6.  We will be independent with a long-term home maintenance exercise program at discharge Baseline: Started 05/09/2024 Goal status: ongoing, 06/19/2024   PLAN:  PT FREQUENCY: 5x/wk for 2 weeks then 2-3x/wk for 4 weeks   PT DURATION: 6 weeks   PLANNED INTERVENTIONS: 97750- Physical Performance Testing, 97110-Therapeutic exercises, 97530- Therapeutic activity, [id number]- Neuromuscular re-education, 97535-  Self Care, 02859- Manual therapy, [phone]- Gait training, [phone]- Electrical stimulation (unattended), [phone]- Vasopneumatic device, Patient/Family education, Balance training, Stair training, Joint mobilization, and Cryotherapy  PLAN FOR NEXT SESSION:  *** Continue with advancing  motion dependent on resolution of treatment at ED.   Check PSFS, knee ROM and strength, manual therapy, vaso for edema, balance, ambulation on ramps and stairs (highly fearful of stairs).    Corean JULIANNA Ku, PT, DPT 06/24/24 7:20 AM

## 2024-06-25 ENCOUNTER — Encounter: Payer: Self-pay | Admitting: Rehabilitative and Restorative Service Providers"

## 2024-06-25 ENCOUNTER — Ambulatory Visit (INDEPENDENT_AMBULATORY_CARE_PROVIDER_SITE_OTHER): Admitting: Rehabilitative and Restorative Service Providers"

## 2024-06-25 DIAGNOSIS — M25561 Pain in right knee: Secondary | ICD-10-CM | POA: Diagnosis not present

## 2024-06-25 DIAGNOSIS — R600 Localized edema: Secondary | ICD-10-CM | POA: Diagnosis not present

## 2024-06-25 DIAGNOSIS — R262 Difficulty in walking, not elsewhere classified: Secondary | ICD-10-CM

## 2024-06-25 DIAGNOSIS — M6281 Muscle weakness (generalized): Secondary | ICD-10-CM

## 2024-06-25 DIAGNOSIS — M25661 Stiffness of right knee, not elsewhere classified: Secondary | ICD-10-CM | POA: Diagnosis not present

## 2024-06-25 NOTE — Therapy (Signed)
 OUTPATIENT PHYSICAL THERAPY TREATMENT NOTE   Patient Name: Brooke Cantrell MRN: [id number] DOB:01/14/1946, 78 y.o., female Today's Date: 06/25/2024   END OF SESSION:  PT End of Session - 06/25/24 1520     Visit Number 19    Number of Visits 38    Date for Recertification  08/01/24    Authorization Type MEDICARE    Progress Note Due on Visit 26    PT Start Time 1516    PT Stop Time 1608    PT Time Calculation (min) 52 min    Activity Tolerance Patient tolerated treatment well;No increased pain    Behavior During Therapy [facility] for tasks assessed/performed            Past Medical History:  Diagnosis Date   Aortic atherosclerosis 05/2024   Arthritis    Breast cancer (HCC)    Left breast   Depression    mild, no meds   History of kidney stones    passed the stone   HLD (hyperlipidemia)    Hypertension    Osteopenia 05/2024   Past Surgical History:  Procedure Laterality Date   breast cancer surgery     BREAST ENHANCEMENT SURGERY     BREAST LUMPECTOMY Left    CATARACT EXTRACTION, BILATERAL     ECTOPIC PREGNANCY SURGERY     EXAM UNDER ANESTHESIA WITH MANIPULATION OF KNEE Right 06/19/2024   Procedure: MANIPULATION, JOINT, KNEE, WITH ANESTHESIA;  Surgeon: Vernetta Lonni GRADE, MD;  Location: [facility] OR;  Service: Orthopedics;  Laterality: Right;  RIGHT KNEE MANIPULATION UNDER ANESTHESIA   EYE SURGERY     INNER EAR SURGERY     TONSILLECTOMY     TOTAL KNEE ARTHROPLASTY Right 04/22/2024   Procedure: ARTHROPLASTY, KNEE, TOTAL;  Surgeon: Vernetta Lonni GRADE, MD;  Location: [facility] OR;  Service: Orthopedics;  Laterality: Right;   TOTAL SHOULDER ARTHROPLASTY Left 04/25/2018   TOTAL SHOULDER ARTHROPLASTY Left 04/25/2018   Procedure: LEFT TOTAL SHOULDER REPLACEMENT;  Surgeon: Addie Cordella Hamilton, MD;  Location: [facility] OR;  Service: Orthopedics;  Laterality: Left;   TUBAL LIGATION     Patient Active Problem List   Diagnosis Date Noted   Left bundle branch block 06/23/2024   Dizziness  06/23/2024   Coronary artery calcification 06/23/2024   Arthrofibrosis of knee joint, right 06/18/2024   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   Weakness    Hyperlipidemia    Acute respiratory failure due to COVID-19 (HCC) 06/10/2020   Pneumonia due to COVID-19 virus 06/10/2020   Hx of multiple pulmonary nodules 06/10/2020   Acute respiratory failure with hypoxia (HCC)    Lung nodule seen on imaging study 01/08/2020   History of breast cancer 04/14/2019   Sepsis (HCC) 04/02/2019   CAP (community acquired pneumonia) 04/02/2019   Elevated troponin 04/02/2019   Shoulder arthritis 04/25/2018   Primary osteoarthritis, 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   Chronic left shoulder pain 05/17/2017   Cervical disc disorder with radiculopathy 08/14/2016   Chronic infection of sinus 08/31/2015   Cough 08/31/2015   Allergic reaction 08/31/2015    PCP: Alda Carpen, MD  REFERRING PROVIDER: Lonni GRADE Vernetta, MD  REFERRING DIAG: [phone] (ICD-10-CM) - Status post total right knee replacement  THERAPY DIAG:  Acute pain of right knee  Stiffness of right knee, not elsewhere classified  Localized edema  Muscle weakness (generalized)  Difficulty in walking, not elsewhere classified  Rationale for Evaluation and Treatment: Rehabilitation  ONSET DATE: 04/22/2024  SUBJECTIVE:   SUBJECTIVE STATEMENT: Brooke Cantrell reports  feeling better today as compared to the previous 2 days.  She has done as much home exercises as she could, although she has been limited by her illness.  PERTINENT HISTORY: Lt breast cancer, kidney stones, HTN, Lt TSA 2019  PAIN:  Are you having pain? Yes: NPRS scale: 0-3/10 Location: Rt knee Pain description: Stiff, achy, minimal sharp Aggravating factors: Prolonged postures and too much WB Relieving factors: Pain meds, ice, change positions frequently  PRECAUTIONS: None  RED FLAGS: None   WEIGHT BEARING RESTRICTIONS: No  FALLS:  Has patient fallen in  last 6 months? No  LIVING ENVIRONMENT: Lives with: lives with their family and grandson Lives in: House/apartment Stairs: Uses the rail with steps Has following equipment at home: Single point cane and Environmental consultant - 2 wheeled  OCCUPATION: Retired  PLOF: Independent  PATIENT GOALS: Return to gardening her acre lot, weed, plant in the garden    OBJECTIVE:  Note: Objective measures were completed at Evaluation unless otherwise noted.  DIAGNOSTIC FINDINGS: Right knee arthroplasty in expected alignment. No periprosthetic lucency or fracture. There has been patellar resurfacing. Recent postsurgical change includes air and edema in the soft tissues and joint space. Anterior skin staples in place.  PATIENT SURVEYS:  PSFS: THE PATIENT SPECIFIC FUNCTIONAL SCALE  Place score of 0-10 (0 = unable to perform activity and 10 = able to perform activity at the same level as before injury or problem)  Activity Date:  05/09/2024 06/02/2024 06/19/2024 (Unable to assess this date)  Walk 2 5   2.  Garden 0 0   3.  Safely transfer to and from the ground 0 0   4.      Total Score 0.67 1.67     Total Score = Sum of activity scores/number of activities  Minimally Detectable Change: 3 points (for single activity); 2 points (for average score)  [name] (nd). The Patient Specific Functional Scale . Retrieved from [email or web address]   COGNITION: Overall cognitive status: Within functional limits for tasks assessed     SENSATION: WFL  EDEMA:  Noted and not objectively assessed  LOWER EXTREMITY ROM:   ROM Left/Right 05/09/2024 Rt 05/13/24 Right 05/20/24 Right 05/21/24 Right 05/26/24 Right 05/28/24 Rt 06/02/2024 Right 06/04/24 Right 06/06/2024 Rt 06/09/2024 Right 06/13/2024 Rt 06/19/2024  Right 06/20/24 Right 06/25/2024  Knee flexion 145/52 Supine:  A:  P:  Sitting:  P: 70 Supine AA: 76* Seated P: 78* Standing  P: flexion  stretch 82* Seated P: 87* Supine  A: 76deg A: 89  P: 95 Active 93 AROM: 87 PROM: 91 Active 97 AROM: 109deg A: 115 113  Knee extension 0/7     Seated P: -12*  LAQ Supine:  A: 6deg of flexion A: -11 (seated LAQ) Active lacks 5 AROM: 10deg flexion PROM: 8deg flexion  Active 5 AROM: 3deg PROM: 2deg    5   (Blank rows = not tested)  LOWER EXTREMITY STRENGTH:  In pounds with hand-held dynamometer Left/Right 05/09/2024 Right 06/04/24  Knee flexion    Knee extension 49.7/5.9 pounds 27.8#   (Blank rows = not tested)  GAIT: Distance walked: 50 feet Assistive device utilized: Environmental consultant - 2 wheeled Level of assistance: Modified independence Comments: Brooke Cantrell is using her walker full-time at this point   TREATMENT DATE:  06/25/24 Recumbent bike Seat 5 for 5 minutes AAROM to full range  Quad sets with heel prop 2 sets of 10 for 5 seconds Seated knee flexion AAROM (left pushes right) 10 x 5 seconds  TKE with knee flexion in between reps 20 x 3 seconds each Prone knee extension stretch with 1# and rolled up towel above each knee 3 minutes 1#  Functional Activities: Double Leg Press 75# 15 reps full range and slow eccentrics Single Leg Press 37# 10 reps full range and slow eccentrics  Vaso Right Knee 34 degrees High Pressure 10 minutes   06/23/24 R Knee   Pt had Knee manipulation on 06/19/24.  She had PT on 06/19/24 and 06/20/24.  She states after PT on 10/10 she vomited in the bathroom.  She entered clinic this morning and states she has felt dizzy and  nauseous all weekend.  Still felt ill.  I checked her BP in sitting and it was 131/111.  She was placed in supine to rest. Pulse Ox was 92 and dropped to 88 at one point.  Inconsistent wavelength of patient noted on Pulse OX.  Nurse consulted and Tory Gaskins PA came in and agreed with calling EMS.  Pt was taken via EMS to ED. Self Care - Pt educated on signs and symptoms of high BP, heart attack.  Instructed in deep breathing to increase oxygen  levels.     06/20/24 TherAct Recumbent bike L3 seat 5-3 x 8 min Leg press bil 100# 3x10; then RLE 50# 2x10; 20 sec flexion holds between sets Floor transfers modi I with support Bridging 2x10; 5 sec hold AA heel slides 2x10; 5 sec hold    PATIENT EDUCATION:  Education details: See above Person educated: Patient Education method: Explanation, Demonstration, Tactile cues, Verbal cues, and Handouts Education comprehension: verbalized understanding, returned demonstration, verbal cues required, tactile cues required, and needs further education  HOME EXERCISE PROGRAM: Access Code: [email or web address] URL: [email or web address] Access Code: [email or web address] URL: [email or web address] Date: 06/16/2024 Prepared by: Grayce Spatz  Exercises - Seated Knee Flexion AAROM  - 5 x daily - 7 x weekly - 1 sets - 10 reps - 10 seconds hold - Supine Quadricep Sets  - 10 x daily - 7 x weekly - 10 sets - 10 reps - 5 second hold - Seated Knee Flexion Extension AROM   - 2-4 x daily - 7 x weekly - 2-3 sets - 10 reps - 5 seconds hold - Seated Knee Flexion Extension AAROM with Overpressure  - 2-4 x daily - 7 x weekly - 2-3 sets - 10 reps - 5 seconds hold - Seated Long Arc Quad  - 2-4 x daily - 7 x weekly - 2-3 sets - 10 reps - 5 seconds hold - Supine Heel Slide with Strap  - 2-4 x daily - 7 x weekly - 2-3 sets - 10 reps - 5 seconds hold - Standing Knee Flexion Stretch on Step  - 2-3 x daily - 7 x weekly - 3-5 sets - 3 reps - 10 seconds hold - Gastroc Stretch on Step  - 1-3 x daily - 7 x weekly - 1 sets - 3 reps - 30 seconds hold - Standing Heel Raise  - 1-2 x daily - 7 x weekly - 2-3 sets - 10 reps - 2-3 seconds hold - Tandem Stance  - 1 x daily - 7 x weekly - 3 sets - 2 reps - 30 seconds hold - Seated Table Hamstring Stretch  - 1-3 x daily - 7 x weekly - 1 sets - 3 reps - 30 seconds hold - Seated Hamstring Stretch with Strap  - 1-2 x daily - 7 x weekly - 1 sets - 3 reps -  30 seconds hold -  Seated Passive Knee Extension  - 2 x daily - 7 x weekly - 3-5 minutes hold - Prone Knee Extension with Ankle Weight  - 2 x daily - 7 x weekly - 3-5 minute hold - Supine Quadriceps Stretch with Strap on Table  - 1-3 x daily - 7 x weekly - 1 sets - 3 reps - 30 seconds hold  ASSESSMENT:  CLINICAL IMPRESSION: Brooke Cantrell has AROM at 0 - 5 - 113 degrees today.  I emphasized activities to improve her 5 degree extension deficit along with continued quadriceps strengthening and flexion active range of motion activities.  Brooke Cantrell is doing significantly better since her manipulation under anesthesia and is on track to meet long-term goals.  OBJECTIVE IMPAIRMENTS: Abnormal gait, decreased activity tolerance, decreased endurance, decreased knowledge of condition, difficulty walking, decreased ROM, decreased strength, increased edema, and pain.   ACTIVITY LIMITATIONS: bending, sitting, standing, squatting, sleeping, stairs, transfers, bed mobility, dressing, and locomotion level  PARTICIPATION LIMITATIONS: community activity and yard work  PERSONAL FACTORS: Lt breast cancer, kidney stones, HTN, Lt TSA 2019 are also affecting patient's functional outcome.   REHAB POTENTIAL: Good  CLINICAL DECISION MAKING: Stable/uncomplicated  EVALUATION COMPLEXITY: Low   GOALS: Goals reviewed with patient? Yes  SHORT TERM GOALS: Target date: 06/20/2024  Brooke Cantrell will be independent with her day 1 home exercise program Baseline: Started 05/09/2024 Goal status:   Met 06/06/2024  2.  Improve right knee active range of motion to 3 - 0 -95 degrees Baseline: 7 - 0 - 52 degrees Goal status: Partially Met 06/13/2024  3.  Improve right quadriceps strength to at least 30 pounds Baseline: 5.9 pounds Goal status: ONGOING      06/16/2024   LONG TERM GOALS: Target date: 08/01/2024  Improve patient specific functional score to at least 5 Baseline: 0.67 Goal status: ongoing, 06/19/2024  2.  Brooke Cantrell will report right knee pain  consistently 3/10 or better on the numeric pain rating scale Baseline: Can be as high as 8/10 Goal status: Met 06/25/2024  3.  Improve right knee AROM to 0-2-110 degrees (updated, 06/19/2024) Baseline: 0-2-109 06/19/2024 Goal status: Partially met 06/25/2024  4.  Improve bilateral quadriceps strength to 60 pounds or better Baseline: See objective Goal status: ongoing, 06/19/2024  5.  Brooke Cantrell will be comfortable without the use of an assistive device within the house and with no more than a single-point cane outside the house at transfer into independent rehabilitation Baseline: 2 wheeled walker full-time Goal status:  Met 06/13/2024  6.  We will be independent with a long-term home maintenance exercise program at discharge Baseline: Started 05/09/2024 Goal status: ongoing, 06/25/2024   PLAN:  PT FREQUENCY: 5x/wk for 2 weeks then 2-3x/wk for 4 weeks   PT DURATION: 6 weeks   PLANNED INTERVENTIONS: 97750- Physical Performance Testing, 97110-Therapeutic exercises, 97530- Therapeutic activity, 97112- Neuromuscular re-education, 97535- Self Care, [id number]- Manual therapy, [phone]- Gait training, [phone]- Electrical stimulation (unattended), [id number]- Vasopneumatic device, Patient/Family education, Balance training, Stair training, Joint mobilization, and Cryotherapy  PLAN FOR NEXT SESSION:  Check PSFS, strength, manual therapy, vaso for edema, balance, ambulation on ramps and stairs (highly fearful of stairs).    Myer LELON Ivory PT, MPT 06/25/24 5:17 PM

## 2024-06-26 ENCOUNTER — Ambulatory Visit (INDEPENDENT_AMBULATORY_CARE_PROVIDER_SITE_OTHER)

## 2024-06-26 ENCOUNTER — Encounter: Payer: Self-pay | Admitting: Family Medicine

## 2024-06-26 DIAGNOSIS — M6281 Muscle weakness (generalized): Secondary | ICD-10-CM | POA: Diagnosis not present

## 2024-06-26 DIAGNOSIS — R42 Dizziness and giddiness: Secondary | ICD-10-CM

## 2024-06-26 DIAGNOSIS — R600 Localized edema: Secondary | ICD-10-CM

## 2024-06-26 DIAGNOSIS — M25661 Stiffness of right knee, not elsewhere classified: Secondary | ICD-10-CM

## 2024-06-26 DIAGNOSIS — R262 Difficulty in walking, not elsewhere classified: Secondary | ICD-10-CM

## 2024-06-26 DIAGNOSIS — M25561 Pain in right knee: Secondary | ICD-10-CM

## 2024-06-26 DIAGNOSIS — I700 Atherosclerosis of aorta: Secondary | ICD-10-CM

## 2024-06-26 NOTE — Therapy (Signed)
 OUTPATIENT PHYSICAL THERAPY TREATMENT NOTE   Patient Name: Brooke Cantrell MRN: [id number] DOB:June 19, 1946, 78 y.o., female Today's Date: 06/26/2024   END OF SESSION:  PT End of Session - 06/26/24 1142     Visit Number 20    Number of Visits 38    Date for Recertification  08/01/24    Authorization Type MEDICARE    Progress Note Due on Visit 26    PT Start Time 1142    PT Stop Time 1230    PT Time Calculation (min) 48 min    Activity Tolerance Patient tolerated treatment well;No increased pain    Behavior During Therapy [facility] for tasks assessed/performed             Past Medical History:  Diagnosis Date   Aortic atherosclerosis 05/2024   Arthritis    Breast cancer (HCC)    Left breast   Depression    mild, no meds   History of kidney stones    passed the stone   HLD (hyperlipidemia)    Hypertension    Osteopenia 05/2024   Past Surgical History:  Procedure Laterality Date   breast cancer surgery     BREAST ENHANCEMENT SURGERY     BREAST LUMPECTOMY Left    CATARACT EXTRACTION, BILATERAL     ECTOPIC PREGNANCY SURGERY     EXAM UNDER ANESTHESIA WITH MANIPULATION OF KNEE Right 06/19/2024   Procedure: MANIPULATION, JOINT, KNEE, WITH ANESTHESIA;  Surgeon: Vernetta Lonni GRADE, MD;  Location: [facility] OR;  Service: Orthopedics;  Laterality: Right;  RIGHT KNEE MANIPULATION UNDER ANESTHESIA   EYE SURGERY     INNER EAR SURGERY     TONSILLECTOMY     TOTAL KNEE ARTHROPLASTY Right 04/22/2024   Procedure: ARTHROPLASTY, KNEE, TOTAL;  Surgeon: Vernetta Lonni GRADE, MD;  Location: [facility] OR;  Service: Orthopedics;  Laterality: Right;   TOTAL SHOULDER ARTHROPLASTY Left 04/25/2018   TOTAL SHOULDER ARTHROPLASTY Left 04/25/2018   Procedure: LEFT TOTAL SHOULDER REPLACEMENT;  Surgeon: Addie Cordella Hamilton, MD;  Location: [facility] OR;  Service: Orthopedics;  Laterality: Left;   TUBAL LIGATION     Patient Active Problem List   Diagnosis Date Noted   Left bundle branch block 06/23/2024    Dizziness 06/23/2024   Coronary artery calcification 06/23/2024   Arthrofibrosis of knee joint, right 06/18/2024   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   Weakness    Hyperlipidemia    Acute respiratory failure due to COVID-19 (HCC) 06/10/2020   Pneumonia due to COVID-19 virus 06/10/2020   Hx of multiple pulmonary nodules 06/10/2020   Acute respiratory failure with hypoxia (HCC)    Lung nodule seen on imaging study 01/08/2020   History of breast cancer 04/14/2019   Sepsis (HCC) 04/02/2019   CAP (community acquired pneumonia) 04/02/2019   Elevated troponin 04/02/2019   Shoulder arthritis 04/25/2018   Primary osteoarthritis, 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   Chronic left shoulder pain 05/17/2017   Cervical disc disorder with radiculopathy 08/14/2016   Chronic infection of sinus 08/31/2015   Cough 08/31/2015   Allergic reaction 08/31/2015    PCP: Alda Carpen, MD  REFERRING PROVIDER: Lonni GRADE Vernetta, MD  REFERRING DIAG: [phone] (ICD-10-CM) - Status post total right knee replacement  THERAPY DIAG:  Acute pain of right knee  Stiffness of right knee, not elsewhere classified  Localized edema  Muscle weakness (generalized)  Difficulty in walking, not elsewhere classified  Rationale for Evaluation and Treatment: Rehabilitation  ONSET DATE: 04/22/2024  SUBJECTIVE:   SUBJECTIVE STATEMENT: Patient  reports increase in sleep which has led to increased stiffness.   PERTINENT HISTORY: Lt breast cancer, kidney stones, HTN, Lt TSA 2019  PAIN:  Are you having pain? Yes: NPRS scale: 0-3/10 Location: Rt knee Pain description: Stiff, achy, minimal sharp Aggravating factors: Prolonged postures and too much WB Relieving factors: Pain meds, ice, change positions frequently  PRECAUTIONS: None  RED FLAGS: None   WEIGHT BEARING RESTRICTIONS: No  FALLS:  Has patient fallen in last 6 months? No  LIVING ENVIRONMENT: Lives with: lives with their family and  grandson Lives in: House/apartment Stairs: Uses the rail with steps Has following equipment at home: Single point cane and Environmental consultant - 2 wheeled  OCCUPATION: Retired  PLOF: Independent  PATIENT GOALS: Return to gardening her acre lot, weed, plant in the garden    OBJECTIVE:  Note: Objective measures were completed at Evaluation unless otherwise noted.  DIAGNOSTIC FINDINGS: Right knee arthroplasty in expected alignment. No periprosthetic lucency or fracture. There has been patellar resurfacing. Recent postsurgical change includes air and edema in the soft tissues and joint space. Anterior skin staples in place.  PATIENT SURVEYS:  PSFS: THE PATIENT SPECIFIC FUNCTIONAL SCALE  Place score of 0-10 (0 = unable to perform activity and 10 = able to perform activity at the same level as before injury or problem)  Activity Date:  05/09/2024 06/02/2024 06/26/2024   Walk 2 5 8   2.  Garden 0 0 4  3.  Safely transfer to and from the ground 0 0 5  4.      Total Score 0.67 1.67 5.67    Total Score = Sum of activity scores/number of activities  Minimally Detectable Change: 3 points (for single activity); 2 points (for average score)  [name] [facility] (nd). The Patient Specific Functional Scale . Retrieved from [email or web address]   COGNITION: Overall cognitive status: Within functional limits for tasks assessed     SENSATION: WFL  EDEMA:  Noted and not objectively assessed  LOWER EXTREMITY ROM:   ROM Left/Right 05/09/2024 Rt 05/13/24 Right 05/20/24 Right 05/21/24 Right 05/26/24 Right 05/28/24 Rt 06/02/2024 Right 06/04/24 Right 06/06/2024 Rt 06/09/2024 Right 06/13/2024 Rt 06/19/2024  Right 06/20/24 Right 06/25/2024  Knee flexion 145/52 Supine:  A:  P:  Sitting:  P: 70 Supine AA: 76* Seated P: 78* Standing  P: flexion stretch 82* Seated P: 87* Supine  A: 76deg A: 89  P: 95 Active 93 AROM: 87 PROM: 91 Active 97 AROM:  109deg A: 115 113  Knee extension 0/7     Seated P: -12*  LAQ Supine:  A: 6deg of flexion A: -11 (seated LAQ) Active lacks 5 AROM: 10deg flexion PROM: 8deg flexion  Active 5 AROM: 3deg PROM: 2deg    5   (Blank rows = not tested)  LOWER EXTREMITY STRENGTH:  In pounds with hand-held dynamometer Left/Right 05/09/2024 Right 06/04/24  Knee flexion    Knee extension 49.7/5.9 pounds 27.8#   (Blank rows = not tested)  GAIT: Distance walked: 50 feet Assistive device utilized: Environmental consultant - 2 wheeled Level of assistance: Modified independence Comments: Gearldene is using her walker full-time at this point   TREATMENT DATE:  06/26/2024 TherEx:  Recumbent bike 8 minutes with partial revolutions, moved seat back to 6 and was able to reach full backwards and forward revolutions  Supine heel slides 1x30 with 2-3s hold  Seated AROM knee ext and flex 1x30 with 2-3s holds in each direction   Neuro Re-Ed:  Side-lying vestibular assessment with no nystagmus  noted, though slight lightheadedness with position change  PT discussed vestibular signs and symptoms with patient   Self-Care:  Postural hypotension assessment performed according to CDC standards with patient testing positive  Supine 5 minutes: 146/90, 86 Standing 1 minute: 106/84, 103 Standing 3 minutes: 117/99, 104 Patient provided with educational handout while PT discussed signs/symptoms, appropriate care, and next steps to take  PT then discussed with patient's spouse  PT discussed importance of hydration with water  or low calorie drinks as well as potential need for water  with electrolytes   06/25/24 Recumbent bike Seat 5 for 5 minutes AAROM to full range  Quad sets with heel prop 2 sets of 10 for 5 seconds Seated knee flexion AAROM (left pushes right) 10 x 5 seconds  TKE with knee flexion in between reps 20 x 3 seconds each Prone knee extension stretch with 1# and rolled up towel above each knee 3 minutes 1#  Functional  Activities: Double Leg Press 75# 15 reps full range and slow eccentrics Single Leg Press 37# 10 reps full range and slow eccentrics  Vaso Right Knee 34 degrees High Pressure 10 minutes   06/23/24 R Knee   Pt had Knee manipulation on 06/19/24.  She had PT on 06/19/24 and 06/20/24.  She states after PT on 10/10 she vomited in the bathroom.  She entered clinic this morning and states she has felt dizzy and  nauseous all weekend.  Still felt ill.  I checked her BP in sitting and it was 131/111.  She was placed in supine to rest. Pulse Ox was 92 and dropped to 88 at one point.  Inconsistent wavelength of patient noted on Pulse OX.  Nurse consulted and Tory Gaskins PA came in and agreed with calling EMS.  Pt was taken via EMS to ED. Self Care - Pt educated on signs and symptoms of high BP, heart attack.  Instructed in deep breathing to increase oxygen levels.     06/20/24 TherAct Recumbent bike L3 seat 5-3 x 8 min Leg press bil 100# 3x10; then RLE 50# 2x10; 20 sec flexion holds between sets Floor transfers modi I with support Bridging 2x10; 5 sec hold AA heel slides 2x10; 5 sec hold    PATIENT EDUCATION:  Education details: See above Person educated: Patient Education method: Explanation, Demonstration, Tactile cues, Verbal cues, and Handouts Education comprehension: verbalized understanding, returned demonstration, verbal cues required, tactile cues required, and needs further education  HOME EXERCISE PROGRAM: Access Code: [email or web address] URL: [email or web address] Access Code: [email or web address] URL: [email or web address] Date: 06/16/2024 Prepared by: Grayce Spatz  Exercises - Seated Knee Flexion AAROM  - 5 x daily - 7 x weekly - 1 sets - 10 reps - 10 seconds hold - Supine Quadricep Sets  - 10 x daily - 7 x weekly - 10 sets - 10 reps - 5 second hold - Seated Knee Flexion Extension AROM   - 2-4 x daily - 7 x weekly - 2-3 sets - 10 reps - 5 seconds hold - Seated Knee  Flexion Extension AAROM with Overpressure  - 2-4 x daily - 7 x weekly - 2-3 sets - 10 reps - 5 seconds hold - Seated Long Arc Quad  - 2-4 x daily - 7 x weekly - 2-3 sets - 10 reps - 5 seconds hold - Supine Heel Slide with Strap  - 2-4 x daily - 7 x weekly - 2-3 sets - 10 reps - 5 seconds hold - Standing Knee Flexion Stretch on Step  -  2-3 x daily - 7 x weekly - 3-5 sets - 3 reps - 10 seconds hold - Gastroc Stretch on Step  - 1-3 x daily - 7 x weekly - 1 sets - 3 reps - 30 seconds hold - Standing Heel Raise  - 1-2 x daily - 7 x weekly - 2-3 sets - 10 reps - 2-3 seconds hold - Tandem Stance  - 1 x daily - 7 x weekly - 3 sets - 2 reps - 30 seconds hold - Seated Table Hamstring Stretch  - 1-3 x daily - 7 x weekly - 1 sets - 3 reps - 30 seconds hold - Seated Hamstring Stretch with Strap  - 1-2 x daily - 7 x weekly - 1 sets - 3 reps - 30 seconds hold - Seated Passive Knee Extension  - 2 x daily - 7 x weekly - 3-5 minutes hold - Prone Knee Extension with Ankle Weight  - 2 x daily - 7 x weekly - 3-5 minute hold - Supine Quadriceps Stretch with Strap on Table  - 1-3 x daily - 7 x weekly - 1 sets - 3 reps - 30 seconds hold  ASSESSMENT:  CLINICAL IMPRESSION: Patient arrived to session noting increased sleep quantity which caused increased stiffness in Rt knee. Patient tolerated intermittent activities performed between vestibular and postural hypotension assessments. Patient tested positive for postural hypotension and was given educational handouts and discussed reaching out to primary care for follow up. Patient will continue to benefit from skilled PT.   OBJECTIVE IMPAIRMENTS: Abnormal gait, decreased activity tolerance, decreased endurance, decreased knowledge of condition, difficulty walking, decreased ROM, decreased strength, increased edema, and pain.   ACTIVITY LIMITATIONS: bending, sitting, standing, squatting, sleeping, stairs, transfers, bed mobility, dressing, and locomotion  level  PARTICIPATION LIMITATIONS: community activity and yard work  PERSONAL FACTORS: Lt breast cancer, kidney stones, HTN, Lt TSA 2019 are also affecting patient's functional outcome.   REHAB POTENTIAL: Good  CLINICAL DECISION MAKING: Stable/uncomplicated  EVALUATION COMPLEXITY: Low   GOALS: Goals reviewed with patient? Yes  SHORT TERM GOALS: Target date: 06/20/2024  Garlene will be independent with her day 1 home exercise program Baseline: Started 05/09/2024 Goal status:   Met 06/06/2024  2.  Improve right knee active range of motion to 3 - 0 -95 degrees Baseline: 7 - 0 - 52 degrees Goal status: Partially Met 06/13/2024  3.  Improve right quadriceps strength to at least 30 pounds Baseline: 5.9 pounds Goal status: ONGOING      06/16/2024   LONG TERM GOALS: Target date: 08/01/2024  Improve patient specific functional score to at least 5 Baseline: 0.67 Goal status: ongoing, 06/19/2024  2.  Cyanna will report right knee pain consistently 3/10 or better on the numeric pain rating scale Baseline: Can be as high as 8/10 Goal status: Met 06/25/2024  3.  Improve right knee AROM to 0-2-110 degrees (updated, 06/19/2024) Baseline: 0-2-109 06/19/2024 Goal status: Partially met 06/25/2024  4.  Improve bilateral quadriceps strength to 60 pounds or better Baseline: See objective Goal status: ongoing, 06/19/2024  5.  Cheyan will be comfortable without the use of an assistive device within the house and with no more than a single-point cane outside the house at transfer into independent rehabilitation Baseline: 2 wheeled walker full-time Goal status:  Met 06/13/2024  6.  We will be independent with a long-term home maintenance exercise program at discharge Baseline: Started 05/09/2024 Goal status: ongoing, 06/25/2024   PLAN:  PT FREQUENCY: 5x/wk for 2 weeks  then 2-3x/wk for 4 weeks   PT DURATION: 6 weeks   PLANNED INTERVENTIONS: 97750- Physical Performance Testing, 97110-Therapeutic  exercises, 97530- Therapeutic activity, [id number]- Neuromuscular re-education, 97535- Self Care, [id number]- Manual therapy, [phone]- Gait training, [phone]- Electrical stimulation (unattended), [id number]- Vasopneumatic device, Patient/Family education, Balance training, Stair training, Joint mobilization, and Cryotherapy  PLAN FOR NEXT SESSION: strength, manual therapy, vaso for edema, balance, ambulation on ramps and stairs (highly fearful of stairs).    Susannah Daring, PT, DPT 06/26/24 12:53 PM

## 2024-06-27 ENCOUNTER — Ambulatory Visit: Admitting: Rehabilitative and Restorative Service Providers"

## 2024-06-27 ENCOUNTER — Encounter: Payer: Self-pay | Admitting: Rehabilitative and Restorative Service Providers"

## 2024-06-27 DIAGNOSIS — R600 Localized edema: Secondary | ICD-10-CM

## 2024-06-27 DIAGNOSIS — M25561 Pain in right knee: Secondary | ICD-10-CM | POA: Diagnosis not present

## 2024-06-27 DIAGNOSIS — M6281 Muscle weakness (generalized): Secondary | ICD-10-CM | POA: Diagnosis not present

## 2024-06-27 DIAGNOSIS — M25661 Stiffness of right knee, not elsewhere classified: Secondary | ICD-10-CM | POA: Diagnosis not present

## 2024-06-27 DIAGNOSIS — R262 Difficulty in walking, not elsewhere classified: Secondary | ICD-10-CM

## 2024-06-27 NOTE — Therapy (Signed)
 OUTPATIENT PHYSICAL THERAPY TREATMENT NOTE   Patient Name: Brooke Cantrell MRN: [id number] DOB:18-Jan-1946, 78 y.o., female Today's Date: 06/27/2024   END OF SESSION:  PT End of Session - 06/27/24 1011     Visit Number 21    Number of Visits 38    Date for Recertification  08/01/24    Authorization Type MEDICARE    Progress Note Due on Visit 26    PT Start Time 1010    PT Stop Time 1108    PT Time Calculation (min) 58 min    Activity Tolerance Patient tolerated treatment well;No increased pain    Behavior During Therapy Brooke Cantrell for tasks assessed/performed              Past Medical History:  Diagnosis Date   Aortic atherosclerosis 05/2024   Arthritis    Breast cancer (HCC)    Left breast   Depression    mild, no meds   History of kidney stones    passed the stone   HLD (hyperlipidemia)    Hypertension    Osteopenia 05/2024   Past Surgical History:  Procedure Laterality Date   breast cancer surgery     BREAST ENHANCEMENT SURGERY     BREAST LUMPECTOMY Left    CATARACT EXTRACTION, BILATERAL     ECTOPIC PREGNANCY SURGERY     EXAM UNDER ANESTHESIA WITH MANIPULATION OF KNEE Right 06/19/2024   Procedure: MANIPULATION, JOINT, KNEE, WITH ANESTHESIA;  Surgeon: Brooke Brooke GRADE, MD;  Location: [facility] OR;  Service: Orthopedics;  Laterality: Right;  RIGHT KNEE MANIPULATION UNDER ANESTHESIA   EYE SURGERY     INNER EAR SURGERY     TONSILLECTOMY     TOTAL KNEE ARTHROPLASTY Right 04/22/2024   Procedure: ARTHROPLASTY, KNEE, TOTAL;  Surgeon: Brooke Brooke GRADE, MD;  Location: [facility] OR;  Service: Orthopedics;  Laterality: Right;   TOTAL SHOULDER ARTHROPLASTY Left 04/25/2018   TOTAL SHOULDER ARTHROPLASTY Left 04/25/2018   Procedure: LEFT TOTAL SHOULDER REPLACEMENT;  Surgeon: Brooke Cordella Hamilton, MD;  Location: [facility] OR;  Service: Orthopedics;  Laterality: Left;   TUBAL LIGATION     Patient Active Problem List   Diagnosis Date Noted   Left bundle branch block 06/23/2024    Dizziness 06/23/2024   Coronary artery calcification 06/23/2024   Arthrofibrosis of knee joint, right 06/18/2024   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   Weakness    Hyperlipidemia    Acute respiratory failure due to COVID-19 (HCC) 06/10/2020   Pneumonia due to COVID-19 virus 06/10/2020   Hx of multiple pulmonary nodules 06/10/2020   Acute respiratory failure with hypoxia (HCC)    Lung nodule seen on imaging study 01/08/2020   History of breast cancer 04/14/2019   Sepsis (HCC) 04/02/2019   CAP (community acquired pneumonia) 04/02/2019   Elevated troponin 04/02/2019   Shoulder arthritis 04/25/2018   Primary osteoarthritis, 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   Chronic left shoulder pain 05/17/2017   Cervical disc disorder with radiculopathy 08/14/2016   Chronic infection of sinus 08/31/2015   Cough 08/31/2015   Allergic reaction 08/31/2015    PCP: Brooke Carpen, MD  REFERRING PROVIDER: Lonni Cantrell Vernetta, MD  REFERRING DIAG: [phone] (ICD-10-CM) - Status post total right knee replacement  THERAPY DIAG:  Acute pain of right knee  Stiffness of right knee, not elsewhere classified  Localized edema  Muscle weakness (generalized)  Difficulty in walking, not elsewhere classified  Rationale for Evaluation and Treatment: Rehabilitation  ONSET DATE: 04/22/2024  SUBJECTIVE:   SUBJECTIVE STATEMENT:  Brooke Cantrell still reports feeling not 100%.  She felt off after taking her meds this morning.  PERTINENT HISTORY: Lt breast cancer, kidney stones, HTN, Lt TSA 2019  PAIN:  Are you having pain? Yes: NPRS scale: 0-3/10 this week Location: Rt knee Pain description: Stiff, achy, rare sharp Aggravating factors: Prolonged postures and too much WB Relieving factors: Pain meds, ice, change positions frequently  PRECAUTIONS: None  RED FLAGS: None   WEIGHT BEARING RESTRICTIONS: No  FALLS:  Has patient fallen in last 6 months? No  LIVING ENVIRONMENT: Lives with: lives  with their family and grandson Lives in: House/apartment Stairs: Uses the rail with steps Has following equipment at home: Single point cane and Environmental consultant - 2 wheeled  OCCUPATION: Retired  PLOF: Independent  PATIENT GOALS: Return to gardening her acre lot, weed, plant in the garden    OBJECTIVE:  Note: Objective measures were completed at Evaluation unless otherwise noted.  DIAGNOSTIC FINDINGS: Right knee arthroplasty in expected alignment. No periprosthetic lucency or fracture. There has been patellar resurfacing. Recent postsurgical change includes air and edema in the soft tissues and joint space. Anterior skin staples in place.  PATIENT SURVEYS:  PSFS: THE PATIENT SPECIFIC FUNCTIONAL SCALE  Place score of 0-10 (0 = unable to perform activity and 10 = able to perform activity at the same level as before injury or problem)  Activity Date:  05/09/2024 06/02/2024 06/26/2024   Walk 2 5 8   2.  Garden 0 0 4  3.  Safely transfer to and from the ground 0 0 5  4.      Total Score 0.67 1.67 5.67    Total Score = Sum of activity scores/number of activities  Minimally Detectable Change: 3 points (for single activity); 2 points (for average score)  [facility] (nd). The Patient Specific Functional Scale . Retrieved from [email or web address]   COGNITION: Overall cognitive status: Within functional limits for tasks assessed     SENSATION: WFL  EDEMA:  Noted and not objectively assessed  LOWER EXTREMITY ROM:   ROM Left/Right 05/09/2024 Rt 05/13/24 Right 05/20/24 Right 05/21/24 Right 05/26/24 Right 05/28/24 Rt 06/02/2024 Right 06/04/24 Right 06/06/2024 Rt 06/09/2024 Right 06/13/2024 Rt 06/19/2024  Right 06/20/24 Right 06/25/2024 Right 06/27/2024  Knee flexion 145/52 Supine:  A:  P:  Sitting:  P: 70 Supine AA: 76* Seated P: 78* Standing  P: flexion stretch 82* Seated P: 87* Supine  A: 76deg A: 89  P: 95 Active  93 AROM: 87 PROM: 91 Active 97 AROM: 109deg A: 115 113 117  Knee extension 0/7     Seated P: -12*  LAQ Supine:  A: 6deg of flexion A: -11 (seated LAQ) Active lacks 5 AROM: 10deg flexion PROM: 8deg flexion  Active 5 AROM: 3deg PROM: 2deg    5 Lacks 5   (Blank rows = not tested)  LOWER EXTREMITY STRENGTH:  In pounds with hand-held dynamometer Left/Right 05/09/2024 Right 06/04/24  Knee flexion    Knee extension 49.7/5.9 pounds 27.8#   (Blank rows = not tested)  GAIT: Distance walked: 50 feet Assistive device utilized: Environmental consultant - 2 wheeled Level of assistance: Modified independence Comments: Almee is using her walker full-time at this point   TREATMENT DATE:  06/27/2024 Recumbent bike Seat 5 AAROM to AROM  for 5 minutes Quad sets with heel prop 2 sets of 10 for 5 seconds Seated knee flexion AAROM (left pushes right) 10 x 10 seconds  TKE with knee flexion in between reps 10 x 5  seconds each Prone knee extension stretch with 1# and rolled up towel above each knee 3 minutes 1# Side-lie clams with Blue Thera-Band 10 x 3 seconds  Functional Activities: Double Leg Press 75# 15 reps full range and slow eccentrics Single Leg Press 43# 10 reps full range and slow eccentrics  Neuromuscular re-education: Tandem balance: eyes open 3 x (wider stance); head turning and eyes closed Hip hike in parallel bars (for lateral lean with gait) 2 sets of 10 for 3 seconds  Vaso Right Knee 34 degrees High Pressure 10 minutes   06/26/2024 TherEx:  Recumbent bike 8 minutes with partial revolutions, moved seat back to 6 and was able to reach full backwards and forward revolutions  Supine heel slides 1x30 with 2-3s hold  Seated AROM knee ext and flex 1x30 with 2-3s holds in each direction   Neuro Re-Ed:  Side-lying vestibular assessment with no nystagmus noted, though slight lightheadedness with position change  PT discussed vestibular signs and symptoms with patient   Self-Care:  Postural  hypotension assessment performed according to CDC standards with patient testing positive  Supine 5 minutes: 146/90, 86 Standing 1 minute: 106/84, 103 Standing 3 minutes: 117/99, 104 Patient provided with educational handout while PT discussed signs/symptoms, appropriate care, and next steps to take  PT then discussed with patient's spouse  PT discussed importance of hydration with water  or low calorie drinks as well as potential need for water  with electrolytes    06/25/24 Recumbent bike Seat 5 for 5 minutes AAROM to full range  Quad sets with heel prop 2 sets of 10 for 5 seconds Seated knee flexion AAROM (left pushes right) 10 x 5 seconds  TKE with knee flexion in between reps 20 x 3 seconds each Prone knee extension stretch with 1# and rolled up towel above each knee 3 minutes 1#  Functional Activities: Double Leg Press 75# 15 reps full range and slow eccentrics Single Leg Press 37# 10 reps full range and slow eccentrics  Vaso Right Knee 34 degrees High Pressure 10 minutes    PATIENT EDUCATION:  Education details: See above Person educated: Patient Education method: Explanation, Demonstration, Tactile cues, Verbal cues, and Handouts Education comprehension: verbalized understanding, returned demonstration, verbal cues required, tactile cues required, and needs further education  HOME EXERCISE PROGRAM: Access Code: [email or web address] URL: [email or web address] Access Code: [email or web address] URL: [email or web address] Date: 06/16/2024 Prepared by: Grayce Spatz  Exercises - Seated Knee Flexion AAROM  - 5 x daily - 7 x weekly - 1 sets - 10 reps - 10 seconds hold - Supine Quadricep Sets  - 10 x daily - 7 x weekly - 10 sets - 10 reps - 5 second hold - Seated Knee Flexion Extension AROM   - 2-4 x daily - 7 x weekly - 2-3 sets - 10 reps - 5 seconds hold - Seated Knee Flexion Extension AAROM with Overpressure  - 2-4 x daily - 7 x weekly - 2-3 sets - 10 reps - 5 seconds  hold - Seated Long Arc Quad  - 2-4 x daily - 7 x weekly - 2-3 sets - 10 reps - 5 seconds hold - Supine Heel Slide with Strap  - 2-4 x daily - 7 x weekly - 2-3 sets - 10 reps - 5 seconds hold - Standing Knee Flexion Stretch on Step  - 2-3 x daily - 7 x weekly - 3-5 sets - 3 reps - 10 seconds hold - Gastroc Stretch on Step  - 1-3 x daily - 7  x weekly - 1 sets - 3 reps - 30 seconds hold - Standing Heel Raise  - 1-2 x daily - 7 x weekly - 2-3 sets - 10 reps - 2-3 seconds hold - Tandem Stance  - 1 x daily - 7 x weekly - 3 sets - 2 reps - 30 seconds hold - Seated Table Hamstring Stretch  - 1-3 x daily - 7 x weekly - 1 sets - 3 reps - 30 seconds hold - Seated Hamstring Stretch with Strap  - 1-2 x daily - 7 x weekly - 1 sets - 3 reps - 30 seconds hold - Seated Passive Knee Extension  - 2 x daily - 7 x weekly - 3-5 minutes hold - Prone Knee Extension with Ankle Weight  - 2 x daily - 7 x weekly - 3-5 minute hold - Supine Quadriceps Stretch with Strap on Table  - 1-3 x daily - 7 x weekly - 1 sets - 3 reps - 30 seconds hold  ASSESSMENT:  CLINICAL IMPRESSION: Flexion AROM was 117 today.  Extension AROM still needs work as Kween is lacking about 5 degrees from full extension.  Izzah reports and demonstrates a good understanding of her HEP and she will continue to benefit from consistent HEP compliance along with continued supervised physical therapy.  OBJECTIVE IMPAIRMENTS: Abnormal gait, decreased activity tolerance, decreased endurance, decreased knowledge of condition, difficulty walking, decreased ROM, decreased strength, increased edema, and pain.   ACTIVITY LIMITATIONS: bending, sitting, standing, squatting, sleeping, stairs, transfers, bed mobility, dressing, and locomotion level  PARTICIPATION LIMITATIONS: community activity and yard work  PERSONAL FACTORS: Lt breast cancer, kidney stones, HTN, Lt TSA 2019 are also affecting patient's functional outcome.   REHAB POTENTIAL: Good  CLINICAL DECISION  MAKING: Stable/uncomplicated  EVALUATION COMPLEXITY: Low   GOALS: Goals reviewed with patient? Yes  SHORT TERM GOALS: Target date: 06/20/2024  Caprice will be independent with her day 1 home exercise program Baseline: Started 05/09/2024 Goal status:   Met 06/06/2024  2.  Improve right knee active range of motion to 3 - 0 -95 degrees Baseline: 7 - 0 - 52 degrees Goal status: Partially Met 06/27/2024  3.  Improve right quadriceps strength to at least 30 pounds Baseline: 5.9 pounds Goal status: ONGOING      06/16/2024   LONG TERM GOALS: Target date: 08/01/2024  Improve patient specific functional score to at least 5 Baseline: 0.67 Goal status: Met 06/27/2024  2.  Jalissa will report right knee pain consistently 3/10 or better on the numeric pain rating scale Baseline: Can be as high as 8/10 Goal status: Met 06/25/2024  3.  Improve right knee AROM to 0-2-110 degrees (updated, 06/19/2024) Baseline: 0-2-109 06/19/2024 Goal status: Partially met 06/25/2024  4.  Improve bilateral quadriceps strength to 60 pounds or better Baseline: See objective Goal status: ongoing, 06/19/2024  5.  Kleo will be comfortable without the use of an assistive device within the house and with no more than a single-point cane outside the house at transfer into independent rehabilitation Baseline: 2 wheeled walker full-time Goal status:  Met 06/13/2024  6.  We will be independent with a long-term home maintenance exercise program at discharge Baseline: Started 05/09/2024 Goal status: ongoing, 06/27/2024   PLAN:  PT FREQUENCY: 2-3x/wk for 4 weeks   PT DURATION: 6 weeks   PLANNED INTERVENTIONS: 97750- Physical Performance Testing, 97110-Therapeutic exercises, 97530- Therapeutic activity, [id number]- Neuromuscular re-education, 97535- Self Care, [id number]- Manual therapy, [id number]- Gait training, [phone]- Electrical stimulation (unattended), [id number]- Vasopneumatic  device, Patient/Family education, Balance training, Stair  training, Joint mobilization, and Cryotherapy  PLAN FOR NEXT SESSION: Quadriceps strength, extension active range of motion, manual therapy, vaso for edema, balance, ambulation on ramps and stairs (highly fearful of stairs).    Myer LELON Ivory, PT, MPT 06/27/24 2:05 PM

## 2024-06-30 ENCOUNTER — Ambulatory Visit (INDEPENDENT_AMBULATORY_CARE_PROVIDER_SITE_OTHER): Admitting: Physical Therapy

## 2024-06-30 ENCOUNTER — Encounter: Payer: Self-pay | Admitting: Family Medicine

## 2024-06-30 ENCOUNTER — Encounter: Payer: Self-pay | Admitting: Physical Therapy

## 2024-06-30 DIAGNOSIS — R600 Localized edema: Secondary | ICD-10-CM | POA: Diagnosis not present

## 2024-06-30 DIAGNOSIS — M25561 Pain in right knee: Secondary | ICD-10-CM

## 2024-06-30 DIAGNOSIS — R42 Dizziness and giddiness: Secondary | ICD-10-CM

## 2024-06-30 DIAGNOSIS — M25661 Stiffness of right knee, not elsewhere classified: Secondary | ICD-10-CM

## 2024-06-30 DIAGNOSIS — M6281 Muscle weakness (generalized): Secondary | ICD-10-CM | POA: Diagnosis not present

## 2024-06-30 DIAGNOSIS — R262 Difficulty in walking, not elsewhere classified: Secondary | ICD-10-CM

## 2024-06-30 DIAGNOSIS — I700 Atherosclerosis of aorta: Secondary | ICD-10-CM

## 2024-06-30 NOTE — Therapy (Signed)
 OUTPATIENT PHYSICAL THERAPY TREATMENT NOTE   Patient Name: Brooke Cantrell MRN: [id number] DOB:[birth year], 78 y.o., female Today's Date: 06/30/2024   END OF SESSION:  PT End of Session - 06/30/24 1137     Visit Number 22    Number of Visits 38    Date for Recertification  08/01/24    Authorization Type MEDICARE    Progress Note Due on Visit 26    PT Start Time 1135    PT Stop Time 1228    PT Time Calculation (min) 53 min    Activity Tolerance Patient tolerated treatment well;No increased pain    Behavior During Therapy [facility] for tasks assessed/performed               Past Medical History:  Diagnosis Date   Aortic atherosclerosis 05/2024   Arthritis    Breast cancer (HCC)    Left breast   Depression    mild, no meds   History of kidney stones    passed the stone   HLD (hyperlipidemia)    Hypertension    Osteopenia 05/2024   Past Surgical History:  Procedure Laterality Date   breast cancer surgery     BREAST ENHANCEMENT SURGERY     BREAST LUMPECTOMY Left    CATARACT EXTRACTION, BILATERAL     ECTOPIC PREGNANCY SURGERY     EXAM UNDER ANESTHESIA WITH MANIPULATION OF KNEE Right 06/19/2024   Procedure: MANIPULATION, JOINT, KNEE, WITH ANESTHESIA;  Surgeon: Vernetta Lonni GRADE, MD;  Location: [facility] OR;  Service: Orthopedics;  Laterality: Right;  RIGHT KNEE MANIPULATION UNDER ANESTHESIA   EYE SURGERY     INNER EAR SURGERY     TONSILLECTOMY     TOTAL KNEE ARTHROPLASTY Right 04/22/2024   Procedure: ARTHROPLASTY, KNEE, TOTAL;  Surgeon: Vernetta Lonni GRADE, MD;  Location: [facility] OR;  Service: Orthopedics;  Laterality: Right;   TOTAL SHOULDER ARTHROPLASTY Left 04/25/2018   TOTAL SHOULDER ARTHROPLASTY Left 04/25/2018   Procedure: LEFT TOTAL SHOULDER REPLACEMENT;  Surgeon: Addie Cordella Hamilton, MD;  Location: [facility] OR;  Service: Orthopedics;  Laterality: Left;   TUBAL LIGATION     Patient Active Problem List   Diagnosis Date Noted   Left bundle branch block 06/23/2024    Dizziness 06/23/2024   Coronary artery calcification 06/23/2024   Arthrofibrosis of knee joint, right 06/18/2024   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   Weakness    Hyperlipidemia    Acute respiratory failure due to COVID-19 (HCC) 06/10/2020   Pneumonia due to COVID-19 virus 06/10/2020   Hx of multiple pulmonary nodules 06/10/2020   Acute respiratory failure with hypoxia (HCC)    Lung nodule seen on imaging study 01/08/2020   History of breast cancer 04/14/2019   Sepsis (HCC) 04/02/2019   CAP (community acquired pneumonia) 04/02/2019   Elevated troponin 04/02/2019   Shoulder arthritis 04/25/2018   Primary osteoarthritis, 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   Chronic left shoulder pain 05/17/2017   Cervical disc disorder with radiculopathy 08/14/2016   Chronic infection of sinus 08/31/2015   Cough 08/31/2015   Allergic reaction 08/31/2015    PCP: Alda Carpen, MD  REFERRING PROVIDER: Lonni GRADE Vernetta, MD  REFERRING DIAG: [phone] (ICD-10-CM) - Status post total right knee replacement  THERAPY DIAG:  Acute pain of right knee  Stiffness of right knee, not elsewhere classified  Localized edema  Muscle weakness (generalized)  Difficulty in walking, not elsewhere classified  Rationale for Evaluation and Treatment: Rehabilitation  ONSET DATE: 04/22/2024  SUBJECTIVE:   SUBJECTIVE  STATEMENT: She has been doing her exercises.   PERTINENT HISTORY: Lt breast cancer, kidney stones, HTN, Lt TSA 2019  PAIN:  Are you having pain? Yes: NPRS scale:   in last week up to 4/10 Location: Rt knee Pain description: Stiff, achy, rare sharp Aggravating factors: Prolonged postures and too much WB Relieving factors: Pain meds, ice, change positions frequently  PRECAUTIONS: None  RED FLAGS: None   WEIGHT BEARING RESTRICTIONS: No  FALLS:  Has patient fallen in last 6 months? No  LIVING ENVIRONMENT: Lives with: lives with their family and grandson Lives in:  House/apartment Stairs: Uses the rail with steps Has following equipment at home: Single point cane and Environmental consultant - 2 wheeled  OCCUPATION: Retired  PLOF: Independent  PATIENT GOALS: Return to gardening her acre lot, weed, plant in the garden    OBJECTIVE:  Note: Objective measures were completed at Evaluation unless otherwise noted.  DIAGNOSTIC FINDINGS: Right knee arthroplasty in expected alignment. No periprosthetic lucency or fracture. There has been patellar resurfacing. Recent postsurgical change includes air and edema in the soft tissues and joint space. Anterior skin staples in place.  PATIENT SURVEYS:  PSFS: THE PATIENT SPECIFIC FUNCTIONAL SCALE  Place score of 0-10 (0 = unable to perform activity and 10 = able to perform activity at the same level as before injury or problem)  Activity Date:  05/09/2024 06/02/2024 06/26/2024   Walk 2 5 8   2.  Garden 0 0 4  3.  Safely transfer to and from the ground 0 0 5  4.      Total Score 0.67 1.67 5.67    Total Score = Sum of activity scores/number of activities  Minimally Detectable Change: 3 points (for single activity); 2 points (for average score)  [name] (nd). The Patient Specific Functional Scale . Retrieved from [email or web address]   COGNITION: Overall cognitive status: Within functional limits for tasks assessed     SENSATION: WFL  EDEMA:  Noted and not objectively assessed  LOWER EXTREMITY ROM:   ROM Left/Right  05/09/2024 Rt 05/13/24 Right 05/20/24 Right 05/21/24 Right 05/26/24 Right 05/28/24 Rt 06/02/24 Right 06/04/24 Right  06/06/24 Rt 06/09/24 Right  06/13/24 Rt 06/19/24  Right 06/20/24 Right 06/25/24 Right 06/27/24 Right 06/30/24  Knee flexion 145/52 Supine:  A:  P:  Sitting:  P: 70 Supine AA: 76* Seated P: 78* Standing  P: flexion stretch 82* Seated P: 87* Supine  A: 76deg A: 89  P: 95 Active 93 AROM: 87 PROM: 91 Active 97  AROM: 109deg A: 115 113 117 Seated P: 117* A: 110*  Knee extension 0/7     Seated P: -12*  LAQ Supine:  A: 6deg of flexion A: -11 (seated LAQ) Active lacks 5 AROM: 10deg flexion PROM: 8deg flexion  Active 5 AROM: 3deg PROM: 2deg    5 Lacks 5    (Blank rows = not tested)  LOWER EXTREMITY STRENGTH:  In pounds with hand-held dynamometer Left/Right 05/09/2024 Right 06/04/24 Right 06/30/2024  Knee flexion     Knee extension 49.7/5.9 pounds 27.8# 30.8, 30.5 lbs   (Blank rows = not tested)  GAIT: Distance walked: 50 feet Assistive device utilized: Environmental consultant - 2 wheeled Level of assistance: Modified independence Comments: Arlissa is using her walker full-time at this point   TREATMENT DATE:  06/30/2024 Therapeutic Exercise: Recumbent bike seat 6 flexion stretch 1 min, then backwards full revolutions 2 min, then forward full revolutions level 1 for 5 min.   Gastroc stretch on step  heel depression and hamstring stretch foot on step 30 sec hold 3 reps each Knee ext machine BLEs 15# 10 reps;  BLE concentric & RLE only isometric / eccentric 5# 10 reps  Therapeutic Activities: RLE Step up 8 step with LLE hip flexion forward shift to facilitate right knee ext 10 reps 5 sec hold Standing RLE knee TKE green theraband 10 reps 1st set TKE only, 2nd set TKE plus stepping LLE terminal stance to initial contact using visual lines for step through pattern.  Stepping over 6 hurdle alternating LEs for RLE active knee flexion to clear and stance ext while stepping LLE 10 reps with intermittent touch & mirror / verbal cues to . limit circumduction Side stepping over 6 hurdle to right & to left 10 reps Gait with focus on ext with initial contact thru midstance for 15 steps, then focus on knee flexion terminal stance thru swing 15 steps.  Carryover noted after focus.  PT recommended focusing on each for 15 steps 3+ times per day.  Pt verbalized understanding.    Manual Therapy: PROM with overpressure for  knee flexion Grade IV mob for knee ext.  See objective data for ROM & MMT.   TREATMENT DATE:  06/27/2024 Recumbent bike Seat 5 AAROM to AROM  for 5 minutes Quad sets with heel prop 2 sets of 10 for 5 seconds Seated knee flexion AAROM (left pushes right) 10 x 10 seconds  TKE with knee flexion in between reps 10 x 5 seconds each Prone knee extension stretch with 1# and rolled up towel above each knee 3 minutes 1# Side-lie clams with Blue Thera-Band 10 x 3 seconds  Functional Activities: Double Leg Press 75# 15 reps full range and slow eccentrics Single Leg Press 43# 10 reps full range and slow eccentrics  Neuromuscular re-education: Tandem balance: eyes open 3 x (wider stance); head turning and eyes closed Hip hike in parallel bars (for lateral lean with gait) 2 sets of 10 for 3 seconds  Vaso Right Knee 34 degrees High Pressure 10 minutes   06/26/2024 TherEx: Recumbent bike 8 minutes with partial revolutions, moved seat back to 6 and was able to reach full backwards and forward revolutions  Supine heel slides 1x30 with 2-3s hold  Seated AROM knee ext and flex 1x30 with 2-3s holds in each direction   Neuro Re-Ed:  Side-lying vestibular assessment with no nystagmus noted, though slight lightheadedness with position change  PT discussed vestibular signs and symptoms with patient   Self-Care:  Postural hypotension assessment performed according to CDC standards with patient testing positive  Supine 5 minutes: 146/90, 86 Standing 1 minute: 106/84, 103 Standing 3 minutes: 117/99, 104 Patient provided with educational handout while PT discussed signs/symptoms, appropriate care, and next steps to take  PT then discussed with patient's spouse  PT discussed importance of hydration with water  or low calorie drinks as well as potential need for water  with electrolytes      PATIENT EDUCATION:  Education details: See above Person educated: Patient Education method: Explanation,  Demonstration, Tactile cues, Verbal cues, and Handouts Education comprehension: verbalized understanding, returned demonstration, verbal cues required, tactile cues required, and needs further education  HOME EXERCISE PROGRAM: Access Code: [email or web address] URL: [email or web address] Access Code: [email or web address] URL: [email or web address] Date: 06/16/2024 Prepared by: Grayce Spatz  Exercises - Seated Knee Flexion AAROM  - 5 x daily - 7 x weekly - 1 sets - 10 reps - 10 seconds hold - Supine Quadricep Sets  - 10 x daily -  7 x weekly - 10 sets - 10 reps - 5 second hold - Seated Knee Flexion Extension AROM   - 2-4 x daily - 7 x weekly - 2-3 sets - 10 reps - 5 seconds hold - Seated Knee Flexion Extension AAROM with Overpressure  - 2-4 x daily - 7 x weekly - 2-3 sets - 10 reps - 5 seconds hold - Seated Long Arc Quad  - 2-4 x daily - 7 x weekly - 2-3 sets - 10 reps - 5 seconds hold - Supine Heel Slide with Strap  - 2-4 x daily - 7 x weekly - 2-3 sets - 10 reps - 5 seconds hold - Standing Knee Flexion Stretch on Step  - 2-3 x daily - 7 x weekly - 3-5 sets - 3 reps - 10 seconds hold - Gastroc Stretch on Step  - 1-3 x daily - 7 x weekly - 1 sets - 3 reps - 30 seconds hold - Standing Heel Raise  - 1-2 x daily - 7 x weekly - 2-3 sets - 10 reps - 2-3 seconds hold - Tandem Stance  - 1 x daily - 7 x weekly - 3 sets - 2 reps - 30 seconds hold - Seated Table Hamstring Stretch  - 1-3 x daily - 7 x weekly - 1 sets - 3 reps - 30 seconds hold - Seated Hamstring Stretch with Strap  - 1-2 x daily - 7 x weekly - 1 sets - 3 reps - 30 seconds hold - Seated Passive Knee Extension  - 2 x daily - 7 x weekly - 3-5 minutes hold - Prone Knee Extension with Ankle Weight  - 2 x daily - 7 x weekly - 3-5 minute hold - Supine Quadriceps Stretch with Strap on Table  - 1-3 x daily - 7 x weekly - 1 sets - 3 reps - 30 seconds hold  ASSESSMENT:  CLINICAL IMPRESSION: Patient improved functional knee extension with  PT instruction.  She met MMT STG today.  Patient improved her gait with PT instruction and progressive activities.   OBJECTIVE IMPAIRMENTS: Abnormal gait, decreased activity tolerance, decreased endurance, decreased knowledge of condition, difficulty walking, decreased ROM, decreased strength, increased edema, and pain.   ACTIVITY LIMITATIONS: bending, sitting, standing, squatting, sleeping, stairs, transfers, bed mobility, dressing, and locomotion level  PARTICIPATION LIMITATIONS: community activity and yard work  PERSONAL FACTORS: Lt breast cancer, kidney stones, HTN, Lt TSA 2019 are also affecting patient's functional outcome.   REHAB POTENTIAL: Good  CLINICAL DECISION MAKING: Stable/uncomplicated  EVALUATION COMPLEXITY: Low   GOALS: Goals reviewed with patient? Yes  SHORT TERM GOALS: Target date: 06/20/2024  Daisee will be independent with her day 1 home exercise program Baseline: Started 05/09/2024 Goal status:   Met 06/06/2024  2.  Improve right knee active range of motion to 3 - 0 -95 degrees Baseline: 7 - 0 - 52 degrees Goal status: Partially Met 06/27/2024  3.  Improve right quadriceps strength to at least 30 pounds Baseline: 5.9 pounds Goal status: MET   06/30/2024   LONG TERM GOALS: Target date: 08/01/2024  Improve patient specific functional score to at least 5 Baseline: 0.67 Goal status: Met 06/27/2024  2.  Bronte will report right knee pain consistently 3/10 or better on the numeric pain rating scale Baseline: Can be as high as 8/10 Goal status: Met 06/25/2024  3.  Improve right knee AROM to 0-2-110 degrees (updated, 06/19/2024) Baseline: 0-2-109 06/19/2024 Goal status: Partially met 06/25/2024  4.  Improve  bilateral quadriceps strength to 60 pounds or better Baseline: See objective Goal status: ongoing  06/30/2024  5.  Carrigan will be comfortable without the use of an assistive device within the house and with no more than a single-point cane outside the house  at transfer into independent rehabilitation Baseline: 2 wheeled walker full-time Goal status:  Met 06/13/2024  6.  We will be independent with a long-term home maintenance exercise program at discharge Baseline: Started 05/09/2024 Goal status: ongoing 06/30/2024   PLAN:  PT FREQUENCY: 2-3x/wk for 4 weeks   PT DURATION: 6 weeks   PLANNED INTERVENTIONS: 97750- Physical Performance Testing, 97110-Therapeutic exercises, 97530- Therapeutic activity, 97112- Neuromuscular re-education, 97535- Self Care, [id number]- Manual therapy, [phone]- Gait training, [phone]- Electrical stimulation (unattended), [id number]- Vasopneumatic device, Patient/Family education, Balance training, Stair training, Joint mobilization, and Cryotherapy  PLAN FOR NEXT SESSION: send PT progress note to MD prior to office visit.    Quadriceps strength, extension active range of motion, manual therapy, vaso for edema, balance, ambulation on ramps and stairs (highly fearful of stairs).    Grayce Spatz, PT, DPT 06/30/2024, 12:38 PM

## 2024-07-02 ENCOUNTER — Ambulatory Visit: Admitting: Orthopaedic Surgery

## 2024-07-02 ENCOUNTER — Ambulatory Visit (INDEPENDENT_AMBULATORY_CARE_PROVIDER_SITE_OTHER): Admitting: Physical Therapy

## 2024-07-02 ENCOUNTER — Other Ambulatory Visit: Payer: Self-pay | Admitting: Family Medicine

## 2024-07-02 ENCOUNTER — Encounter: Payer: Self-pay | Admitting: Orthopaedic Surgery

## 2024-07-02 ENCOUNTER — Encounter: Payer: Self-pay | Admitting: Physical Therapy

## 2024-07-02 DIAGNOSIS — I459 Conduction disorder, unspecified: Secondary | ICD-10-CM

## 2024-07-02 DIAGNOSIS — I700 Atherosclerosis of aorta: Secondary | ICD-10-CM

## 2024-07-02 DIAGNOSIS — M24661 Ankylosis, right knee: Secondary | ICD-10-CM

## 2024-07-02 DIAGNOSIS — M25561 Pain in right knee: Secondary | ICD-10-CM | POA: Diagnosis not present

## 2024-07-02 DIAGNOSIS — R42 Dizziness and giddiness: Secondary | ICD-10-CM

## 2024-07-02 DIAGNOSIS — Z96651 Presence of right artificial knee joint: Secondary | ICD-10-CM

## 2024-07-02 DIAGNOSIS — R55 Syncope and collapse: Secondary | ICD-10-CM

## 2024-07-02 DIAGNOSIS — R262 Difficulty in walking, not elsewhere classified: Secondary | ICD-10-CM

## 2024-07-02 DIAGNOSIS — M6281 Muscle weakness (generalized): Secondary | ICD-10-CM

## 2024-07-02 DIAGNOSIS — R600 Localized edema: Secondary | ICD-10-CM

## 2024-07-02 DIAGNOSIS — M25661 Stiffness of right knee, not elsewhere classified: Secondary | ICD-10-CM

## 2024-07-02 NOTE — Progress Notes (Signed)
 The patient is now 10 weeks status post a right total knee arthroplasty to treat significant right knee arthritis.  A few weeks ago we did have to take her back to the operating room for manipulation under anesthesia.  She has now been back in physical therapy and pushing herself hard.  She is walk without assistive device.  She is an active 78 year old female.  Her right knee motion has improved since the manipulation with extension and flexion of the right knee.  There is still swelling to be expected but is getting less.  She will continue outpatient physical therapy and we will see her back in 4 weeks from now with a standing AP and lateral of her right knee.  Her and her husband usually then relocate to [place]  for the winter.

## 2024-07-02 NOTE — Therapy (Signed)
 OUTPATIENT PHYSICAL THERAPY TREATMENT NOTE   Patient Name: Brooke Cantrell MRN: [id number] DOB:[birth year], 78 y.o., female Today's Date: 07/02/2024  Progress Note Reporting Period 06/19/2024 to 07/02/2024  See note below for Objective Data and Assessment of Progress/Goals.   END OF SESSION:  PT End of Session - 07/02/24 1143     Visit Number 23    Number of Visits 38    Date for Recertification  08/01/24    Authorization Type MEDICARE    Progress Note Due on Visit 26    PT Start Time 1143    PT Stop Time 1238    PT Time Calculation (min) 55 min    Activity Tolerance Patient tolerated treatment well;No increased pain    Behavior During Therapy [facility] for tasks assessed/performed                Past Medical History:  Diagnosis Date   Aortic atherosclerosis 05/2024   Arthritis    Breast cancer (HCC)    Left breast   Depression    mild, no meds   History of kidney stones    passed the stone   HLD (hyperlipidemia)    Hypertension    Osteopenia 05/2024   Past Surgical History:  Procedure Laterality Date   breast cancer surgery     BREAST ENHANCEMENT SURGERY     BREAST LUMPECTOMY Left    CATARACT EXTRACTION, BILATERAL     ECTOPIC PREGNANCY SURGERY     EXAM UNDER ANESTHESIA WITH MANIPULATION OF KNEE Right 06/19/2024   Procedure: MANIPULATION, JOINT, KNEE, WITH ANESTHESIA;  Surgeon: Vernetta Lonni GRADE, MD;  Location: [facility] OR;  Service: Orthopedics;  Laterality: Right;  RIGHT KNEE MANIPULATION UNDER ANESTHESIA   EYE SURGERY     INNER EAR SURGERY     TONSILLECTOMY     TOTAL KNEE ARTHROPLASTY Right 04/22/2024   Procedure: ARTHROPLASTY, KNEE, TOTAL;  Surgeon: Vernetta Lonni GRADE, MD;  Location: [facility] OR;  Service: Orthopedics;  Laterality: Right;   TOTAL SHOULDER ARTHROPLASTY Left 04/25/2018   TOTAL SHOULDER ARTHROPLASTY Left 04/25/2018   Procedure: LEFT TOTAL SHOULDER REPLACEMENT;  Surgeon: Addie Cordella Hamilton, MD;  Location: [facility];  Service: Orthopedics;   Laterality: Left;   TUBAL LIGATION     Patient Active Problem List   Diagnosis Date Noted   Left bundle branch block 06/23/2024   Dizziness 06/23/2024   Coronary artery calcification 06/23/2024   Arthrofibrosis of knee joint, right 06/18/2024   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   Weakness    Hyperlipidemia    Acute respiratory failure due to COVID-19 (HCC) 06/10/2020   Pneumonia due to COVID-19 virus 06/10/2020   Hx of multiple pulmonary nodules 06/10/2020   Acute respiratory failure with hypoxia (HCC)    Lung nodule seen on imaging study 01/08/2020   History of breast cancer 04/14/2019   Sepsis (HCC) 04/02/2019   CAP (community acquired pneumonia) 04/02/2019   Elevated troponin 04/02/2019   Shoulder arthritis 04/25/2018   Primary osteoarthritis, 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   Chronic left shoulder pain 05/17/2017   Cervical disc disorder with radiculopathy 08/14/2016   Chronic infection of sinus 08/31/2015   Cough 08/31/2015   Allergic reaction 08/31/2015    PCP: Alda Carpen, MD  REFERRING PROVIDER: Lonni GRADE Vernetta, MD  REFERRING DIAG: [id number] (ICD-10-CM) - Status post total right knee replacement  THERAPY DIAG:  Acute pain of right knee  Stiffness of right knee, not elsewhere classified  Localized edema  Muscle weakness (generalized)  Difficulty in  walking, not elsewhere classified  Rationale for Evaluation and Treatment: Rehabilitation  ONSET DATE: 04/22/2024  SUBJECTIVE:   SUBJECTIVE STATEMENT: She tried to do some gardening and knelt on right knee to try to get up.  Her knee has been hurting since then.    PERTINENT HISTORY: Lt breast cancer, kidney stones, HTN, Lt TSA 2019  PAIN:  Are you having pain? Yes: NPRS scale:   in last week up to 4/10 (aggravated by kneeling on RLE with gardening) Location: Rt knee Pain description: Stiff, achy, rare sharp Aggravating factors: Prolonged postures and too much WB Relieving factors:  Pain meds, ice, change positions frequently  PRECAUTIONS: None  RED FLAGS: None   WEIGHT BEARING RESTRICTIONS: No  FALLS:  Has patient fallen in last 6 months? No  LIVING ENVIRONMENT: Lives with: lives with their family and grandson Lives in: House/apartment Stairs: Uses the rail with steps Has following equipment at home: Single point cane and Environmental consultant - 2 wheeled  OCCUPATION: Retired  PLOF: Independent  PATIENT GOALS: Return to gardening her acre lot, weed, plant in the garden  OBJECTIVE:  Note: Objective measures were completed at Evaluation unless otherwise noted.  DIAGNOSTIC FINDINGS: Right knee arthroplasty in expected alignment. No periprosthetic lucency or fracture. There has been patellar resurfacing. Recent postsurgical change includes air and edema in the soft tissues and joint space. Anterior skin staples in place.  PATIENT SURVEYS:  PSFS: THE PATIENT SPECIFIC FUNCTIONAL SCALE  Place score of 0-10 (0 = unable to perform activity and 10 = able to perform activity at the same level as before injury or problem)  Activity Date:  05/09/2024 06/02/2024 06/26/24  07/02/24  Walk 2 5 8 8   2.  Garden 0 0 4 6  3.  Safely transfer to and from the ground 0 0 5 5  4.       Total Score 0.67 1.67 5.67 6.33    Total Score = Sum of activity scores/number of activities  Minimally Detectable Change: 3 points (for single activity); 2 points (for average score)  [facility] (nd). The Patient Specific Functional Scale . Retrieved from [email or web address]   COGNITION: Overall cognitive status: Within functional limits for tasks assessed     SENSATION: WFL  EDEMA:  Noted and not objectively assessed  LOWER EXTREMITY ROM:   ROM Left/Right  05/09/2024 Rt 05/13/24 Right 05/20/24 Right 05/21/24 Right 05/26/24 Right 05/28/24 Rt 06/02/24 Right 06/04/24 Right  06/06/24 Rt 06/09/24 Right  06/13/24 Rt 06/19/24   Right 06/20/24 Right 06/25/24 Right 06/27/24 Right 07/02/24  Knee flexion 145/52 Supine:  A:  P:  Sitting:  P: 70 Supine AA: 76* Seated P: 78* Standing  P: flexion stretch 82* Seated P: 87* Supine  A: 76deg A: 89  P: 95 Active 93 AROM: 87 PROM: 91 Active 97 AROM: 109deg A: 115 113 117 Seated P: 117* A: 110*  Knee extension 0/7     Seated P: -12*  LAQ Supine:  A: 6deg of flexion A: -11 (seated LAQ) Active lacks 5 AROM: 10deg flexion PROM: 8deg flexion  Active 5 AROM: 3deg PROM: 2deg    5 Lacks 5 LAQ -4* Standing  A: -2*   (Blank rows = not tested)  LOWER EXTREMITY STRENGTH:  In pounds with hand-held dynamometer Left/Right 05/09/2024 Right 06/04/24 Right 06/30/2024  Knee flexion     Knee extension 49.7/5.9 pounds 27.8# 30.8, 30.5 lbs   (Blank rows = not tested)  GAIT: Distance walked: 50 feet Assistive device utilized: Environmental consultant -  2 wheeled Level of assistance: Modified independence Comments: Brooke Cantrell is using her walker full-time at this point   TREATMENT DATE:  07/02/2024 Therapeutic Exercise: Recumbent bike seat 6 flexion stretch 1 min, then forward full revolutions level 1 for 9 min.   Hamstring stretch RLE long sit with strap DF 30 sec 3 reps ITB stretch RLE supine SLR with strap adduction Attempted supine piriformis stretch but pt not able to feel in hip Seated AROM RLE hip rotation IR & ER 10 reps 2 sets  Therapeutic Activities: PT demo & verbal cues on transferring to ground / floor using chair for support.  PT used internet to show pt a gareden kneel bench.  Pt able to transfer to floor using chair half kneeling on LLE.  Pt reports significantly better than what she did at home.   PT educated on walking program starting with 5 min 3 sets with timed 3 min rest bw walks.  Once can do 2-3 times without increase knee pain, add 1 min to each walking time.  Progress time up to her desired amount.  Pt verbalized understanding.   Standing RLE knee TKE blue theraband 10  reps 1st set TKE only, 2nd set TKE plus SLS lifting LLE up for 3-5 sec.    Vaso Right knee with elevation & knee ext stretch 34* 10 min medium compression.    TREATMENT DATE:  06/30/2024 Therapeutic Exercise: Recumbent bike seat 6 flexion stretch 1 min, then backwards full revolutions 2 min, then forward full revolutions level 1 for 5 min.   Gastroc stretch on step heel depression and hamstring stretch foot on step 30 sec hold 3 reps each Knee ext machine BLEs 15# 10 reps;  BLE concentric & RLE only isometric / eccentric 5# 10 reps  Therapeutic Activities: RLE Step up 8 step with LLE hip flexion forward shift to facilitate right knee ext 10 reps 5 sec hold Standing RLE knee TKE green theraband 10 reps 1st set TKE only, 2nd set TKE plus stepping LLE terminal stance to initial contact using visual lines for step through pattern.  Stepping over 6 hurdle alternating LEs for RLE active knee flexion to clear and stance ext while stepping LLE 10 reps with intermittent touch & mirror / verbal cues to . limit circumduction Side stepping over 6 hurdle to right & to left 10 reps Gait with focus on ext with initial contact thru midstance for 15 steps, then focus on knee flexion terminal stance thru swing 15 steps.  Carryover noted after focus.  PT recommended focusing on each for 15 steps 3+ times per day.  Pt verbalized understanding.    Manual Therapy: PROM with overpressure for knee flexion Grade IV mob for knee ext.  See objective data for ROM & MMT.  Vaso Right knee with elevation & knee ext stretch 34* 10 min medium compression.    TREATMENT DATE:  06/27/2024 Recumbent bike Seat 5 AAROM to AROM  for 5 minutes Quad sets with heel prop 2 sets of 10 for 5 seconds Seated knee flexion AAROM (left pushes right) 10 x 10 seconds  TKE with knee flexion in between reps 10 x 5 seconds each Prone knee extension stretch with 1# and rolled up towel above each knee 3 minutes 1# Side-lie clams with  Blue Thera-Band 10 x 3 seconds  Functional Activities: Double Leg Press 75# 15 reps full range and slow eccentrics Single Leg Press 43# 10 reps full range and slow eccentrics  Neuromuscular re-education: Tandem balance:  eyes open 3 x (wider stance); head turning and eyes closed Hip hike in parallel bars (for lateral lean with gait) 2 sets of 10 for 3 seconds  Vaso Right Knee 34 degrees High Pressure 10 minutes     PATIENT EDUCATION:  Education details: See above Person educated: Patient Education method: Explanation, Demonstration, Tactile cues, Verbal cues, and Handouts Education comprehension: verbalized understanding, returned demonstration, verbal cues required, tactile cues required, and needs further education  HOME EXERCISE PROGRAM: Access Code: [email or web address] URL: [email or web address] Access Code: [email or web address] URL: [email or web address] Date: 06/16/2024 Prepared by: Grayce Spatz  Exercises - Seated Knee Flexion AAROM  - 5 x daily - 7 x weekly - 1 sets - 10 reps - 10 seconds hold - Supine Quadricep Sets  - 10 x daily - 7 x weekly - 10 sets - 10 reps - 5 second hold - Seated Knee Flexion Extension AROM   - 2-4 x daily - 7 x weekly - 2-3 sets - 10 reps - 5 seconds hold - Seated Knee Flexion Extension AAROM with Overpressure  - 2-4 x daily - 7 x weekly - 2-3 sets - 10 reps - 5 seconds hold - Seated Long Arc Quad  - 2-4 x daily - 7 x weekly - 2-3 sets - 10 reps - 5 seconds hold - Supine Heel Slide with Strap  - 2-4 x daily - 7 x weekly - 2-3 sets - 10 reps - 5 seconds hold - Standing Knee Flexion Stretch on Step  - 2-3 x daily - 7 x weekly - 3-5 sets - 3 reps - 10 seconds hold - Gastroc Stretch on Step  - 1-3 x daily - 7 x weekly - 1 sets - 3 reps - 30 seconds hold - Standing Heel Raise  - 1-2 x daily - 7 x weekly - 2-3 sets - 10 reps - 2-3 seconds hold - Tandem Stance  - 1 x daily - 7 x weekly - 3 sets - 2 reps - 30 seconds hold - Seated Table Hamstring  Stretch  - 1-3 x daily - 7 x weekly - 1 sets - 3 reps - 30 seconds hold - Seated Hamstring Stretch with Strap  - 1-2 x daily - 7 x weekly - 1 sets - 3 reps - 30 seconds hold - Seated Passive Knee Extension  - 2 x daily - 7 x weekly - 3-5 minutes hold - Prone Knee Extension with Ankle Weight  - 2 x daily - 7 x weekly - 3-5 minute hold - Supine Quadriceps Stretch with Strap on Table  - 1-3 x daily - 7 x weekly - 1 sets - 3 reps - 30 seconds hold  ASSESSMENT:  CLINICAL IMPRESSION: Patient has improved range and functional strength with PT.  She had pain spike trying to on ground to garden since last PT session.  PT demo & verbal cues on transfer to floor recommendations which she appears to understand & reported did not hurt her knee.     Patient improved her gait with PT instruction and progressive activities.   OBJECTIVE IMPAIRMENTS: Abnormal gait, decreased activity tolerance, decreased endurance, decreased knowledge of condition, difficulty walking, decreased ROM, decreased strength, increased edema, and pain.   ACTIVITY LIMITATIONS: bending, sitting, standing, squatting, sleeping, stairs, transfers, bed mobility, dressing, and locomotion level  PARTICIPATION LIMITATIONS: community activity and yard work  PERSONAL FACTORS: Lt breast cancer, kidney stones, HTN, Lt TSA 2019 are also affecting patient's functional outcome.   REHAB POTENTIAL: Good  CLINICAL  DECISION MAKING: Stable/uncomplicated  EVALUATION COMPLEXITY: Low   GOALS: Goals reviewed with patient? Yes  SHORT TERM GOALS: Target date: 06/20/2024  Barbaraann will be independent with her day 1 home exercise program Baseline: Started 05/09/2024 Goal status:   Met 06/06/2024  2.  Improve right knee active range of motion to 3 - 0 -95 degrees Baseline: 7 - 0 - 52 degrees Goal status: Partially Met 06/27/2024  3.  Improve right quadriceps strength to at least 30 pounds Baseline: 5.9 pounds Goal status: MET   06/30/2024   LONG  TERM GOALS: Target date: 08/01/2024  Improve patient specific functional score to at least 5 Baseline: 0.67 Goal status: Met 06/27/2024  2.  Jurnee will report right knee pain consistently 3/10 or better on the numeric pain rating scale Baseline: Can be as high as 8/10 Goal status: Met 06/25/2024  3.  Improve right knee AROM to 0-2-110 degrees (updated, 06/19/2024) Baseline: 0-2-109 06/19/2024 Goal status: Partially met 06/25/2024  4.  Improve bilateral quadriceps strength to 60 pounds or better Baseline: See objective Goal status: ongoing  06/30/2024  5.  Marli will be comfortable without the use of an assistive device within the house and with no more than a single-point cane outside the house at transfer into independent rehabilitation Baseline: 2 wheeled walker full-time Goal status:  Met 06/13/2024  6.  We will be independent with a long-term home maintenance exercise program at discharge Baseline: Started 05/09/2024 Goal status: ongoing 06/30/2024   PLAN:  PT FREQUENCY: 2-3x/wk for 4 weeks   PT DURATION: 6 weeks   PLANNED INTERVENTIONS: 97750- Physical Performance Testing, 97110-Therapeutic exercises, 97530- Therapeutic activity, 97112- Neuromuscular re-education, 97535- Self Care, [id number]- Manual therapy, [phone]- Gait training, [phone]- Electrical stimulation (unattended), [id number]- Vasopneumatic device, Patient/Family education, Balance training, Stair training, Joint mobilization, and Cryotherapy  PLAN FOR NEXT SESSION: check MD / PA note,  discuss discharge vs continuing PT,  assess LTGs.  Quadriceps strength, extension active range of motion, manual therapy, vaso for edema, balance, ambulation on ramps and stairs (highly fearful of stairs).    Grayce Spatz, PT, DPT 07/02/2024, 12:44 PM

## 2024-07-03 ENCOUNTER — Encounter: Payer: Self-pay | Admitting: Physical Therapy

## 2024-07-03 ENCOUNTER — Ambulatory Visit (INDEPENDENT_AMBULATORY_CARE_PROVIDER_SITE_OTHER): Admitting: Physical Therapy

## 2024-07-03 DIAGNOSIS — M25661 Stiffness of right knee, not elsewhere classified: Secondary | ICD-10-CM | POA: Diagnosis not present

## 2024-07-03 DIAGNOSIS — M25561 Pain in right knee: Secondary | ICD-10-CM

## 2024-07-03 DIAGNOSIS — M6281 Muscle weakness (generalized): Secondary | ICD-10-CM

## 2024-07-03 DIAGNOSIS — R262 Difficulty in walking, not elsewhere classified: Secondary | ICD-10-CM

## 2024-07-03 DIAGNOSIS — R600 Localized edema: Secondary | ICD-10-CM

## 2024-07-03 NOTE — Therapy (Signed)
 OUTPATIENT PHYSICAL THERAPY TREATMENT NOTE   Patient Name: Brooke Cantrell MRN: [id number] DOB:May 07, 1946, 78 y.o., female Today's Date: 07/03/2024  END OF SESSION:  PT End of Session - 07/03/24 0935     Visit Number 24    Number of Visits 38    Date for Recertification  08/01/24    Authorization Type MEDICARE    Progress Note Due on Visit 33    PT Start Time 0930    PT Stop Time 1025    PT Time Calculation (min) 55 min    Activity Tolerance Patient tolerated treatment well;No increased pain    Behavior During Therapy [facility] for tasks assessed/performed                 Past Medical History:  Diagnosis Date   Aortic atherosclerosis 05/2024   Arthritis    Breast cancer (HCC)    Left breast   Depression    mild, no meds   History of kidney stones    passed the stone   HLD (hyperlipidemia)    Hypertension    Osteopenia 05/2024   Past Surgical History:  Procedure Laterality Date   breast cancer surgery     BREAST ENHANCEMENT SURGERY     BREAST LUMPECTOMY Left    CATARACT EXTRACTION, BILATERAL     ECTOPIC PREGNANCY SURGERY     EXAM UNDER ANESTHESIA WITH MANIPULATION OF KNEE Right 06/19/2024   Procedure: MANIPULATION, JOINT, KNEE, WITH ANESTHESIA;  Surgeon: Vernetta Lonni GRADE, MD;  Location: [facility] OR;  Service: Orthopedics;  Laterality: Right;  RIGHT KNEE MANIPULATION UNDER ANESTHESIA   EYE SURGERY     INNER EAR SURGERY     TONSILLECTOMY     TOTAL KNEE ARTHROPLASTY Right 04/22/2024   Procedure: ARTHROPLASTY, KNEE, TOTAL;  Surgeon: Vernetta Lonni GRADE, MD;  Location: [facility] OR;  Service: Orthopedics;  Laterality: Right;   TOTAL SHOULDER ARTHROPLASTY Left 04/25/2018   TOTAL SHOULDER ARTHROPLASTY Left 04/25/2018   Procedure: LEFT TOTAL SHOULDER REPLACEMENT;  Surgeon: Addie Cordella Hamilton, MD;  Location: [facility] OR;  Service: Orthopedics;  Laterality: Left;   TUBAL LIGATION     Patient Active Problem List   Diagnosis Date Noted   Left bundle branch block 06/23/2024    Dizziness 06/23/2024   Coronary artery calcification 06/23/2024   Arthrofibrosis of knee joint, right 06/18/2024   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   Weakness    Hyperlipidemia    Acute respiratory failure due to COVID-19 (HCC) 06/10/2020   Pneumonia due to COVID-19 virus 06/10/2020   Hx of multiple pulmonary nodules 06/10/2020   Acute respiratory failure with hypoxia (HCC)    Lung nodule seen on imaging study 01/08/2020   History of breast cancer 04/14/2019   Sepsis (HCC) 04/02/2019   CAP (community acquired pneumonia) 04/02/2019   Elevated troponin 04/02/2019   Shoulder arthritis 04/25/2018   Primary osteoarthritis, 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   Chronic left shoulder pain 05/17/2017   Cervical disc disorder with radiculopathy 08/14/2016   Chronic infection of sinus 08/31/2015   Cough 08/31/2015   Allergic reaction 08/31/2015    PCP: Alda Carpen, MD  REFERRING PROVIDER: Lonni GRADE Vernetta, MD  REFERRING DIAG: [phone] (ICD-10-CM) - Status post total right knee replacement  THERAPY DIAG:  Acute pain of right knee  Stiffness of right knee, not elsewhere classified  Localized edema  Muscle weakness (generalized)  Difficulty in walking, not elsewhere classified  Rationale for Evaluation and Treatment: Rehabilitation  ONSET DATE: 04/22/2024  SUBJECTIVE:  SUBJECTIVE STATEMENT: 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 She has not tried walking program but plans to.   PERTINENT HISTORY: Lt breast cancer, kidney stones, HTN, Lt TSA 2019  PAIN:  Are you having pain? Yes: NPRS scale:   since last PT up to 3-4/10 Location: Rt knee Pain description: Stiff, achy, rare sharp Aggravating factors: Prolonged postures and too much WB Relieving factors: Pain meds, ice, change positions frequently  PRECAUTIONS: None  RED FLAGS: None   WEIGHT BEARING RESTRICTIONS: No  FALLS:  Has patient fallen in last 6 months? No  LIVING  ENVIRONMENT: Lives with: lives with their family and grandson Lives in: House/apartment Stairs: Uses the rail with steps Has following equipment at home: Single point cane and Environmental consultant - 2 wheeled  OCCUPATION: Retired  PLOF: Independent  PATIENT GOALS: Return to gardening her acre lot, weed, plant in the garden  OBJECTIVE:  Note: Objective measures were completed at Evaluation unless otherwise noted.  DIAGNOSTIC FINDINGS: Right knee arthroplasty in expected alignment. No periprosthetic lucency or fracture. There has been patellar resurfacing. Recent postsurgical change includes air and edema in the soft tissues and joint space. Anterior skin staples in place.  PATIENT SURVEYS:  PSFS: THE PATIENT SPECIFIC FUNCTIONAL SCALE  Place score of 0-10 (0 = unable to perform activity and 10 = able to perform activity at the same level as before injury or problem)  Activity Date:  05/09/2024 06/02/2024 06/26/24  07/02/24  Walk 2 5 8 8   2.  Garden 0 0 4 6  3.  Safely transfer to and from the ground 0 0 5 5  4.       Total Score 0.67 1.67 5.67 6.33    Total Score = Sum of activity scores/number of activities  Minimally Detectable Change: 3 points (for single activity); 2 points (for average score)  [name] (nd). The Patient Specific Functional Scale . Retrieved from [email or web address]   COGNITION: Overall cognitive status: Within functional limits for tasks assessed     SENSATION: WFL  EDEMA:  Noted and not objectively assessed  LOWER EXTREMITY ROM:   ROM Left/Right  05/09/2024 Rt 05/13/24 Right 05/20/24 Right 05/21/24 Right 05/26/24 Right 05/28/24 Rt 06/02/24 Right 06/04/24 Right  06/06/24 Rt 06/09/24 Right  06/13/24 Rt 06/19/24  Right 06/20/24 Right 06/25/24 Right 06/27/24 Right 07/02/24  Knee flexion 145/52 Supine:  A:  P:  Sitting:  P: 70 Supine AA: 76* Seated P: 78* Standing  P: flexion stretch 82*  Seated P: 87* Supine  A: 76deg A: 89  P: 95 Active 93 AROM: 87 PROM: 91 Active 97 AROM: 109deg A: 115 113 117 Seated P: 117* A: 110*  Knee extension 0/7     Seated P: -12*  LAQ Supine:  A: 6deg of flexion A: -11 (seated LAQ) Active lacks 5 AROM: 10deg flexion PROM: 8deg flexion  Active 5 AROM: 3deg PROM: 2deg    5 Lacks 5 LAQ -4* Standing  A: -2*   (Blank rows = not tested)  LOWER EXTREMITY STRENGTH:  In pounds with hand-held dynamometer Left/Right 05/09/2024 Right 06/04/24 Right 06/30/2024  Knee flexion     Knee extension 49.7/5.9 pounds 27.8# 30.8, 30.5 lbs   (Blank rows = not tested)  GAIT: Distance walked: 50 feet Assistive device utilized: Walker - 2 wheeled Level of assistance: Modified independence Comments: Jacey is using her walker full-time at this point   TREATMENT DATE:  07/03/2024 Therapeutic Exercise: Recumbent bike seat 6 flexion stretch  1 min, backwards 1 min then forward full revolutions level 1 for 8 min.    Therapeutic Activities: stepping up, over & down on BOSU round side up with 2 step lead for some momentum with BUE support // bars, 10 reps with each LE so RLE functions for stairs, uneven terrain and lands / accepts weight.   BOSU round side up lateral step up RLE with LLE reach anterior across midline (Rt knee extended) and posteriorly across midline (Rt knee unlocked / slight flexion) 10 reps with BUE support // bars Stairs 2 rails 11 steps alternating pattern slow controlled knee motion with PT cueing on technique Standing RLE knee TKE blue theraband 10 reps 1st set TKE only, 2nd set TKE plus SLS lifting LLE up for 3-5 sec.    Neuromuscular re-education: Tandem stance RLE in front 1st rep and in back 2nd rep:  1st set floor eyes open, 2nd set floor eyes close & 3rd set foam eyes open.    Vaso Right knee with elevation & knee ext stretch 34* 10 min medium compression.   TREATMENT DATE:  07/02/2024 Therapeutic Exercise: Recumbent bike seat 6  flexion stretch 1 min, then forward full revolutions level 1 for 9 min.   Hamstring stretch RLE long sit with strap DF 30 sec 3 reps ITB stretch RLE supine SLR with strap adduction Attempted supine piriformis stretch but pt not able to feel in hip Seated AROM RLE hip rotation IR & ER 10 reps 2 sets  Therapeutic Activities: PT demo & verbal cues on transferring to ground / floor using chair for support.  PT used internet to show pt a gareden kneel bench.  Pt able to transfer to floor using chair half kneeling on LLE.  Pt reports significantly better than what she did at home.   PT educated on walking program starting with 5 min 3 sets with timed 3 min rest bw walks.  Once can do 2-3 times without increase knee pain, add 1 min to each walking time.  Progress time up to her desired amount.  Pt verbalized understanding.   Standing RLE knee TKE blue theraband 10 reps 1st set TKE only, 2nd set TKE plus SLS lifting LLE up for 3-5 sec.    Vaso Right knee with elevation & knee ext stretch 34* 10 min medium compression.    TREATMENT DATE:  06/30/2024 Therapeutic Exercise: Recumbent bike seat 6 flexion stretch 1 min, then backwards full revolutions 2 min, then forward full revolutions level 1 for 5 min.   Gastroc stretch on step heel depression and hamstring stretch foot on step 30 sec hold 3 reps each Knee ext machine BLEs 15# 10 reps;  BLE concentric & RLE only isometric / eccentric 5# 10 reps  Therapeutic Activities: RLE Step up 8 step with LLE hip flexion forward shift to facilitate right knee ext 10 reps 5 sec hold Standing RLE knee TKE green theraband 10 reps 1st set TKE only, 2nd set TKE plus stepping LLE terminal stance to initial contact using visual lines for step through pattern.  Stepping over 6 hurdle alternating LEs for RLE active knee flexion to clear and stance ext while stepping LLE 10 reps with intermittent touch & mirror / verbal cues to . limit circumduction Side stepping over 6  hurdle to right & to left 10 reps Gait with focus on ext with initial contact thru midstance for 15 steps, then focus on knee flexion terminal stance thru swing 15 steps.  Carryover noted after  focus.  PT recommended focusing on each for 15 steps 3+ times per day.  Pt verbalized understanding.    Manual Therapy: PROM with overpressure for knee flexion Grade IV mob for knee ext.  See objective data for ROM & MMT.  Vaso Right knee with elevation & knee ext stretch 34* 10 min medium compression.     PATIENT EDUCATION:  Education details: See above Person educated: Patient Education method: Explanation, Demonstration, Tactile cues, Verbal cues, and Handouts Education comprehension: verbalized understanding, returned demonstration, verbal cues required, tactile cues required, and needs further education  HOME EXERCISE PROGRAM: Access Code: [email or web address] URL: [email or web address] Access Code: [email or web address] URL: [email or web address] Date: 06/16/2024 Prepared by: Grayce Spatz  Exercises - Seated Knee Flexion AAROM  - 5 x daily - 7 x weekly - 1 sets - 10 reps - 10 seconds hold - Supine Quadricep Sets  - 10 x daily - 7 x weekly - 10 sets - 10 reps - 5 second hold - Seated Knee Flexion Extension AROM   - 2-4 x daily - 7 x weekly - 2-3 sets - 10 reps - 5 seconds hold - Seated Knee Flexion Extension AAROM with Overpressure  - 2-4 x daily - 7 x weekly - 2-3 sets - 10 reps - 5 seconds hold - Seated Long Arc Quad  - 2-4 x daily - 7 x weekly - 2-3 sets - 10 reps - 5 seconds hold - Supine Heel Slide with Strap  - 2-4 x daily - 7 x weekly - 2-3 sets - 10 reps - 5 seconds hold - Standing Knee Flexion Stretch on Step  - 2-3 x daily - 7 x weekly - 3-5 sets - 3 reps - 10 seconds hold - Gastroc Stretch on Step  - 1-3 x daily - 7 x weekly - 1 sets - 3 reps - 30 seconds hold - Standing Heel Raise  - 1-2 x daily - 7 x weekly - 2-3 sets - 10 reps - 2-3 seconds hold - Tandem Stance  - 1 x  daily - 7 x weekly - 3 sets - 2 reps - 30 seconds hold - Seated Table Hamstring Stretch  - 1-3 x daily - 7 x weekly - 1 sets - 3 reps - 30 seconds hold - Seated Hamstring Stretch with Strap  - 1-2 x daily - 7 x weekly - 1 sets - 3 reps - 30 seconds hold - Seated Passive Knee Extension  - 2 x daily - 7 x weekly - 3-5 minutes hold - Prone Knee Extension with Ankle Weight  - 2 x daily - 7 x weekly - 3-5 minute hold - Supine Quadriceps Stretch with Strap on Table  - 1-3 x daily - 7 x weekly - 1 sets - 3 reps - 30 seconds hold  ASSESSMENT:  CLINICAL IMPRESSION: Patient improved knee control with step activities on BOSU which simulates uneven terrain like gardening.  Pt is still anxious about stairs but was able to control knee with 2 rail support.    Patient improved her gait with PT instruction and progressive activities.   OBJECTIVE IMPAIRMENTS: Abnormal gait, decreased activity tolerance, decreased endurance, decreased knowledge of condition, difficulty walking, decreased ROM, decreased strength, increased edema, and pain.   ACTIVITY LIMITATIONS: bending, sitting, standing, squatting, sleeping, stairs, transfers, bed mobility, dressing, and locomotion level  PARTICIPATION LIMITATIONS: community activity and yard work  PERSONAL FACTORS: Lt breast cancer, kidney stones, HTN, Lt TSA 2019 are also affecting patient's functional outcome.   REHAB  POTENTIAL: Good  CLINICAL DECISION MAKING: Stable/uncomplicated  EVALUATION COMPLEXITY: Low   GOALS: Goals reviewed with patient? Yes  SHORT TERM GOALS: Target date: 06/20/2024  Loreena will be independent with her day 1 home exercise program Baseline: Started 05/09/2024 Goal status:   Met 06/06/2024  2.  Improve right knee active range of motion to 3 - 0 -95 degrees Baseline: 7 - 0 - 52 degrees Goal status: Partially Met 06/27/2024  3.  Improve right quadriceps strength to at least 30 pounds Baseline: 5.9 pounds Goal status: MET    06/30/2024   LONG TERM GOALS: Target date: 08/01/2024  Improve patient specific functional score to at least 5 Baseline: 0.67 Goal status: Met 06/27/2024  2.  Arlone will report right knee pain consistently 3/10 or better on the numeric pain rating scale Baseline: Can be as high as 8/10 Goal status: Met 06/25/2024  3.  Improve right knee AROM to 0-2-110 degrees (updated, 06/19/2024) Baseline: 0-2-109 06/19/2024 Goal status: Partially met 06/25/2024  4.  Improve bilateral quadriceps strength to 60 pounds or better Baseline: See objective Goal status: ongoing  06/30/2024  5.  [place] will be comfortable without the use of an assistive device within the house and with no more than a single-point cane outside the house at transfer into independent rehabilitation Baseline: 2 wheeled walker full-time Goal status:  Met 06/13/2024  6.  We will be independent with a long-term home maintenance exercise program at discharge Baseline: Started 05/09/2024 Goal status: ongoing 06/30/2024   PLAN:  PT FREQUENCY: 2-3x/wk for 4 weeks   PT DURATION: 6 weeks   PLANNED INTERVENTIONS: 97750- Physical Performance Testing, 97110-Therapeutic exercises, 97530- Therapeutic activity, 97112- Neuromuscular re-education, 97535- Self Care, [id number]- Manual therapy, [phone]- Gait training, [phone]- Electrical stimulation (unattended), [id number]- Vasopneumatic device, Patient/Family education, Balance training, Stair training, Joint mobilization, and Cryotherapy  PLAN FOR NEXT SESSION: continue functional strength, Quadriceps strength, extension active range of motion, manual therapy, vaso for edema, balance, ambulation on ramps and stairs (reducing rail support from 2 to 1).     Grayce Spatz, PT, DPT 07/03/2024, 12:55 PM

## 2024-07-04 ENCOUNTER — Other Ambulatory Visit (HOSPITAL_COMMUNITY): Payer: Self-pay | Admitting: *Deleted

## 2024-07-04 ENCOUNTER — Encounter (HOSPITAL_COMMUNITY): Payer: Self-pay

## 2024-07-04 MED ORDER — METOPROLOL TARTRATE 100 MG PO TABS
ORAL_TABLET | ORAL | 0 refills | Status: DC
Start: 2024-07-04 — End: 2024-07-31

## 2024-07-05 ENCOUNTER — Ambulatory Visit
Admission: RE | Admit: 2024-07-05 | Discharge: 2024-07-05 | Disposition: A | Source: Ambulatory Visit | Attending: Family Medicine | Admitting: Family Medicine

## 2024-07-05 ENCOUNTER — Ambulatory Visit
Admission: RE | Admit: 2024-07-05 | Discharge: 2024-07-05 | Disposition: A | Discharge status: Home / Self Care | Source: Ambulatory Visit | Attending: Family Medicine | Admitting: Family Medicine

## 2024-07-05 DIAGNOSIS — I459 Conduction disorder, unspecified: Secondary | ICD-10-CM | POA: Insufficient documentation

## 2024-07-05 DIAGNOSIS — I700 Atherosclerosis of aorta: Secondary | ICD-10-CM | POA: Diagnosis present

## 2024-07-05 DIAGNOSIS — R55 Syncope and collapse: Secondary | ICD-10-CM

## 2024-07-05 DIAGNOSIS — R42 Dizziness and giddiness: Secondary | ICD-10-CM

## 2024-07-05 MED ORDER — GADOBUTROL 1 MMOL/ML IV SOLN
6.0000 mL | Freq: Once | INTRAVENOUS | Status: AC | PRN
  Administered 2024-07-05: 6 mL via INTRAVENOUS

## 2024-07-07 ENCOUNTER — Ambulatory Visit (INDEPENDENT_AMBULATORY_CARE_PROVIDER_SITE_OTHER): Admitting: Rehabilitative and Restorative Service Providers"

## 2024-07-07 DIAGNOSIS — M6281 Muscle weakness (generalized): Secondary | ICD-10-CM | POA: Diagnosis not present

## 2024-07-07 DIAGNOSIS — R600 Localized edema: Secondary | ICD-10-CM | POA: Diagnosis not present

## 2024-07-07 DIAGNOSIS — M25561 Pain in right knee: Secondary | ICD-10-CM

## 2024-07-07 DIAGNOSIS — M25661 Stiffness of right knee, not elsewhere classified: Secondary | ICD-10-CM

## 2024-07-07 DIAGNOSIS — R262 Difficulty in walking, not elsewhere classified: Secondary | ICD-10-CM

## 2024-07-07 NOTE — Therapy (Signed)
 OUTPATIENT PHYSICAL THERAPY TREATMENT NOTE   Patient Name: Brooke Cantrell MRN: [id number] DOB:August 23, 1946, 78 y.o., female Today's Date: 07/07/2024  END OF SESSION:  PT End of Session - 07/07/24 1342     Visit Number 25    Number of Visits 38    Date for Recertification  08/01/24    Authorization Type MEDICARE    Progress Note Due on Visit 33    PT Start Time 1342    PT Stop Time 1431    PT Time Calculation (min) 49 min    Activity Tolerance Patient tolerated treatment well;No increased pain    Behavior During Therapy [facility] for tasks assessed/performed                  Past Medical History:  Diagnosis Date   Aortic atherosclerosis 05/2024   Arthritis    Breast cancer (HCC)    Left breast   Depression    mild, no meds   History of kidney stones    passed the stone   HLD (hyperlipidemia)    Hypertension    Osteopenia 05/2024   Past Surgical History:  Procedure Laterality Date   breast cancer surgery     BREAST ENHANCEMENT SURGERY     BREAST LUMPECTOMY Left    CATARACT EXTRACTION, BILATERAL     ECTOPIC PREGNANCY SURGERY     EXAM UNDER ANESTHESIA WITH MANIPULATION OF KNEE Right 06/19/2024   Procedure: MANIPULATION, JOINT, KNEE, WITH ANESTHESIA;  Surgeon: Vernetta Lonni GRADE, MD;  Location: [facility] OR;  Service: Orthopedics;  Laterality: Right;  RIGHT KNEE MANIPULATION UNDER ANESTHESIA   EYE SURGERY     INNER EAR SURGERY     TONSILLECTOMY     TOTAL KNEE ARTHROPLASTY Right 04/22/2024   Procedure: ARTHROPLASTY, KNEE, TOTAL;  Surgeon: Vernetta Lonni GRADE, MD;  Location: [facility] OR;  Service: Orthopedics;  Laterality: Right;   TOTAL SHOULDER ARTHROPLASTY Left 04/25/2018   TOTAL SHOULDER ARTHROPLASTY Left 04/25/2018   Procedure: LEFT TOTAL SHOULDER REPLACEMENT;  Surgeon: Addie Cordella Hamilton, MD;  Location: [facility] OR;  Service: Orthopedics;  Laterality: Left;   TUBAL LIGATION     Patient Active Problem List   Diagnosis Date Noted   Left bundle branch block 06/23/2024    Dizziness 06/23/2024   Coronary artery calcification 06/23/2024   Arthrofibrosis of knee joint, right 06/18/2024   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   Weakness    Hyperlipidemia    Acute respiratory failure due to COVID-19 (HCC) 06/10/2020   Pneumonia due to COVID-19 virus 06/10/2020   Hx of multiple pulmonary nodules 06/10/2020   Acute respiratory failure with hypoxia (HCC)    Lung nodule seen on imaging study 01/08/2020   History of breast cancer 04/14/2019   Sepsis (HCC) 04/02/2019   CAP (community acquired pneumonia) 04/02/2019   Elevated troponin 04/02/2019   Shoulder arthritis 04/25/2018   Primary osteoarthritis, 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   Chronic left shoulder pain 05/17/2017   Cervical disc disorder with radiculopathy 08/14/2016   Chronic infection of sinus 08/31/2015   Cough 08/31/2015   Allergic reaction 08/31/2015    PCP: Alda Carpen, MD  REFERRING PROVIDER: Lonni GRADE Vernetta, MD  REFERRING DIAG: [phone] (ICD-10-CM) - Status post total right knee replacement  THERAPY DIAG:  Acute pain of right knee  Stiffness of right knee, not elsewhere classified  Localized edema  Muscle weakness (generalized)  Difficulty in walking, not elsewhere classified  Rationale for Evaluation and Treatment: Rehabilitation  ONSET DATE: 04/22/2024  SUBJECTIVE:  SUBJECTIVE STATEMENT: Pt indicated she woke up early one morning and got up and cleaned which led to extra swelling afterwards.  Reported general achy.   PERTINENT HISTORY: Lt breast cancer, kidney stones, HTN, Lt TSA 2019  PAIN:   NPRS scale:  achy, no specific number provided.  Location: Rt knee Pain description: Stiff, achy, rare sharp Aggravating factors: Prolonged postures and too much WB Relieving factors: Pain meds, ice, change positions frequently  PRECAUTIONS: None  RED FLAGS: None   WEIGHT BEARING RESTRICTIONS: No  FALLS:  Has patient fallen in last 6 months?  No  LIVING ENVIRONMENT: Lives with: lives with their family and grandson Lives in: House/apartment Stairs: Uses the rail with steps Has following equipment at home: Single point cane and Environmental Consultant - 2 wheeled  OCCUPATION: Retired  PLOF: Independent  PATIENT GOALS: Return to gardening her acre lot, weed, plant in the garden  OBJECTIVE:  Note: Objective measures were completed at Evaluation unless otherwise noted.  DIAGNOSTIC FINDINGS: Right knee arthroplasty in expected alignment. No periprosthetic lucency or fracture. There has been patellar resurfacing. Recent postsurgical change includes air and edema in the soft tissues and joint space. Anterior skin staples in place.  PATIENT SURVEYS:  PSFS: THE PATIENT SPECIFIC FUNCTIONAL SCALE  Place score of 0-10 (0 = unable to perform activity and 10 = able to perform activity at the same level as before injury or problem)  Activity Date:  05/09/2024 06/02/2024 06/26/24  07/02/24  Walk 2 5 8 8   2.  Garden 0 0 4 6  3.  Safely transfer to and from the ground 0 0 5 5  4.       Total Score 0.67 1.67 5.67 6.33    Total Score = Sum of activity scores/number of activities  Minimally Detectable Change: 3 points (for single activity); 2 points (for average score)  [name] (nd). The Patient Specific Functional Scale . Retrieved from [email or web address].[email or web address]   COGNITION: Overall cognitive status: Within functional limits for tasks assessed     SENSATION: WFL  EDEMA:  Noted and not objectively assessed  LOWER EXTREMITY ROM:   ROM Left/Right  05/09/2024 Rt 05/13/24 Right 05/20/24 Right 05/21/24 Right 05/26/24 Right 05/28/24 Rt 06/02/24 Right 06/04/24 Right  06/06/24 Rt 06/09/24 Right  06/13/24 Rt 06/19/24  Right 06/20/24 Right 06/25/24 Right 06/27/24 Right 07/02/24  Knee flexion 145/52 Supine:  A:  P:  Sitting:  P: 70 Supine AA: 76* Seated P: 78* Standing  P: flexion  stretch 82* Seated P: 87* Supine  A: 76deg A: 89  P: 95 Active 93 AROM: 87 PROM: 91 Active 97 AROM: 109deg A: 115 113 117 Seated P: 117* A: 110*  Knee extension 0/7     Seated P: -12*  LAQ Supine:  A: 6deg of flexion A: -11 (seated LAQ) Active lacks 5 AROM: 10deg flexion PROM: 8deg flexion  Active 5 AROM: 3deg PROM: 2deg    5 Lacks 5 LAQ -4* Standing  A: -2*   (Blank rows = not tested)  LOWER EXTREMITY STRENGTH:  In pounds with hand-held dynamometer Left/Right 05/09/2024 Right 06/04/24 Right 06/30/2024  Knee flexion     Knee extension 49.7/5.9 pounds 27.8# 30.8, 30.5 lbs   (Blank rows = not tested)  GAIT: 07/07/2024: Independent ambulation reported at home and in public.   Eval: Distance walked: 50 feet Assistive device utilized: Walker - 2 wheeled Level of assistance: Modified independence Comments: Lavere is using her walker full-time at this point  TREATMENT       DATE: 07/07/2024 Therex: Recumbent bike partial circles for ROM knee , seat 6 for several minutes prior to full revolutions to finish out 8 mins total  Incline gastroc stretch 30 sec x 3 bilateral   TherActivity: (to improve squatting, transfers, stairs) Leg press double leg in available knee flexion range 87 lbs x 15 slow lowering focus Leg press single leg 37 lbs x 15 , performed bilaterally  Step on over and down WB on Rt leg 6 inch step x 10 with Lt hand assist on bar.   Neuro Re-ed Tandem stance on foam 1 min x 2 bilateral with occasional HHA on bars, SBA Tandem ambulation on foam fwd/back 6 f x 6 each way in // bars with occasional to moderate HHA, SBA   Vaso: Rt knee 34 deg medium compression in elevation on wedge 10 mins   TREATMENT       DATE: 07/03/2024 Therapeutic Exercise: Recumbent bike seat 6 flexion stretch 1 min, backwards 1 min then forward full revolutions level 1 for 8 min.    Therapeutic Activities: stepping up, over & down on BOSU round side up with 2 step  lead for some momentum with BUE support // bars, 10 reps with each LE so RLE functions for stairs, uneven terrain and lands / accepts weight.   BOSU round side up lateral step up RLE with LLE reach anterior across midline (Rt knee extended) and posteriorly across midline (Rt knee unlocked / slight flexion) 10 reps with BUE support // bars Stairs 2 rails 11 steps alternating pattern slow controlled knee motion with PT cueing on technique Standing RLE knee TKE blue theraband 10 reps 1st set TKE only, 2nd set TKE plus SLS lifting LLE up for 3-5 sec.    Neuromuscular re-education: Tandem stance RLE in front 1st rep and in back 2nd rep:  1st set floor eyes open, 2nd set floor eyes close & 3rd set foam eyes open.    Vaso Right knee with elevation & knee ext stretch 34* 10 min medium compression.   TREATMENT DATE:  07/02/2024 Therapeutic Exercise: Recumbent bike seat 6 flexion stretch 1 min, then forward full revolutions level 1 for 9 min.   Hamstring stretch RLE long sit with strap DF 30 sec 3 reps ITB stretch RLE supine SLR with strap adduction Attempted supine piriformis stretch but pt not able to feel in hip Seated AROM RLE hip rotation IR & ER 10 reps 2 sets  Therapeutic Activities: PT demo & verbal cues on transferring to ground / floor using chair for support.  PT used internet to show pt a gareden kneel bench.  Pt able to transfer to floor using chair half kneeling on LLE.  Pt reports significantly better than what she did at home.   PT educated on walking program starting with 5 min 3 sets with timed 3 min rest bw walks.  Once can do 2-3 times without increase knee pain, add 1 min to each walking time.  Progress time up to her desired amount.  Pt verbalized understanding.   Standing RLE knee TKE blue theraband 10 reps 1st set TKE only, 2nd set TKE plus SLS lifting LLE up for 3-5 sec.    Vaso Right knee with elevation & knee ext stretch 34* 10 min medium compression.     PATIENT  EDUCATION:  Education details: See above Person educated: Patient Education method: Explanation, Demonstration, Tactile cues, Verbal cues, and Handouts Education comprehension: verbalized understanding,  returned demonstration, verbal cues required, tactile cues required, and needs further education  HOME EXERCISE PROGRAM: Access Code: [email or web address] URL: [email or web address] Access Code: [email or web address] URL: [email or web address] Date: 06/16/2024 Prepared by: Grayce Spatz  Exercises - Seated Knee Flexion AAROM  - 5 x daily - 7 x weekly - 1 sets - 10 reps - 10 seconds hold - Supine Quadricep Sets  - 10 x daily - 7 x weekly - 10 sets - 10 reps - 5 second hold - Seated Knee Flexion Extension AROM   - 2-4 x daily - 7 x weekly - 2-3 sets - 10 reps - 5 seconds hold - Seated Knee Flexion Extension AAROM with Overpressure  - 2-4 x daily - 7 x weekly - 2-3 sets - 10 reps - 5 seconds hold - Seated Long Arc Quad  - 2-4 x daily - 7 x weekly - 2-3 sets - 10 reps - 5 seconds hold - Supine Heel Slide with Strap  - 2-4 x daily - 7 x weekly - 2-3 sets - 10 reps - 5 seconds hold - Standing Knee Flexion Stretch on Step  - 2-3 x daily - 7 x weekly - 3-5 sets - 3 reps - 10 seconds hold - Gastroc Stretch on Step  - 1-3 x daily - 7 x weekly - 1 sets - 3 reps - 30 seconds hold - Standing Heel Raise  - 1-2 x daily - 7 x weekly - 2-3 sets - 10 reps - 2-3 seconds hold - Tandem Stance  - 1 x daily - 7 x weekly - 3 sets - 2 reps - 30 seconds hold - Seated Table Hamstring Stretch  - 1-3 x daily - 7 x weekly - 1 sets - 3 reps - 30 seconds hold - Seated Hamstring Stretch with Strap  - 1-2 x daily - 7 x weekly - 1 sets - 3 reps - 30 seconds hold - Seated Passive Knee Extension  - 2 x daily - 7 x weekly - 3-5 minutes hold - Prone Knee Extension with Ankle Weight  - 2 x daily - 7 x weekly - 3-5 minute hold - Supine Quadriceps Stretch with Strap on Table  - 1-3 x daily - 7 x weekly - 1 sets - 3 reps - 30  seconds hold  ASSESSMENT:  CLINICAL IMPRESSION: Pt to continue to benefit from compliant surface (limited hand assist) and functional strengthening to continue towards improved functional activity, specifically outdoor.   OBJECTIVE IMPAIRMENTS: Abnormal gait, decreased activity tolerance, decreased endurance, decreased knowledge of condition, difficulty walking, decreased ROM, decreased strength, increased edema, and pain.   ACTIVITY LIMITATIONS: bending, sitting, standing, squatting, sleeping, stairs, transfers, bed mobility, dressing, and locomotion level  PARTICIPATION LIMITATIONS: community activity and yard work  PERSONAL FACTORS: Lt breast cancer, kidney stones, HTN, Lt TSA 2019 are also affecting patient's functional outcome.   REHAB POTENTIAL: Good  CLINICAL DECISION MAKING: Stable/uncomplicated  EVALUATION COMPLEXITY: Low   GOALS: Goals reviewed with patient? Yes  SHORT TERM GOALS: Target date: 06/20/2024  Laurali will be independent with her day 1 home exercise program Baseline: Started 05/09/2024 Goal status:   Met 06/06/2024  2.  Improve right knee active range of motion to 3 - 0 -95 degrees Baseline: 7 - 0 - 52 degrees Goal status: Partially Met 06/27/2024  3.  Improve right quadriceps strength to at least 30 pounds Baseline: 5.9 pounds Goal status: MET   06/30/2024   LONG TERM GOALS: Target date: 08/01/2024  Improve patient specific functional  score to at least 5 Baseline: 0.67 Goal status: Met 06/27/2024  2.  Germaine will report right knee pain consistently 3/10 or better on the numeric pain rating scale Baseline: Can be as high as 8/10 Goal status: Met 06/25/2024  3.  Improve right knee AROM to 0-2-110 degrees (updated, 06/19/2024) Baseline: 0-2-109 06/19/2024 Goal status: Partially met 06/25/2024  4.  Improve bilateral quadriceps strength to 60 pounds or better Baseline: See objective Goal status: ongoing  06/30/2024  5.  Garyn will be comfortable  without the use of an assistive device within the house and with no more than a single-point cane outside the house at transfer into independent rehabilitation Baseline: 2 wheeled walker full-time Goal status:  Met 06/13/2024  6.  We will be independent with a long-term home maintenance exercise program at discharge Baseline: Started 05/09/2024 Goal status: ongoing 06/30/2024   PLAN:  PT FREQUENCY: 2-3x/wk for 4 weeks   PT DURATION: 6 weeks   PLANNED INTERVENTIONS: 97750- Physical Performance Testing, 97110-Therapeutic exercises, 97530- Therapeutic activity, 97112- Neuromuscular re-education, 97535- Self Care, [id number]- Manual therapy, [phone]- Gait training, [phone]- Electrical stimulation (unattended), [id number]- Vasopneumatic device, Patient/Family education, Balance training, Stair training, Joint mobilization, and Cryotherapy  PLAN FOR NEXT SESSION:Compliant surface balance, WB strengthening.    Ozell Silvan, PT, DPT, OCS, ATC 07/07/24  2:21 PM

## 2024-07-08 ENCOUNTER — Telehealth (HOSPITAL_COMMUNITY): Payer: Self-pay | Admitting: *Deleted

## 2024-07-08 NOTE — Telephone Encounter (Signed)

## 2024-07-09 ENCOUNTER — Ambulatory Visit (HOSPITAL_COMMUNITY)
Admission: RE | Admit: 2024-07-09 | Discharge: 2024-07-09 | Disposition: A | Discharge status: Home / Self Care | Source: Ambulatory Visit | Attending: Student | Admitting: Student

## 2024-07-09 ENCOUNTER — Ambulatory Visit (INDEPENDENT_AMBULATORY_CARE_PROVIDER_SITE_OTHER): Admitting: Physical Therapy

## 2024-07-09 ENCOUNTER — Encounter: Payer: Self-pay | Admitting: Physical Therapy

## 2024-07-09 DIAGNOSIS — M25561 Pain in right knee: Secondary | ICD-10-CM | POA: Diagnosis not present

## 2024-07-09 DIAGNOSIS — M6281 Muscle weakness (generalized): Secondary | ICD-10-CM | POA: Diagnosis not present

## 2024-07-09 DIAGNOSIS — R600 Localized edema: Secondary | ICD-10-CM

## 2024-07-09 DIAGNOSIS — I447 Left bundle-branch block, unspecified: Secondary | ICD-10-CM | POA: Diagnosis present

## 2024-07-09 DIAGNOSIS — R262 Difficulty in walking, not elsewhere classified: Secondary | ICD-10-CM

## 2024-07-09 DIAGNOSIS — M25661 Stiffness of right knee, not elsewhere classified: Secondary | ICD-10-CM

## 2024-07-09 MED ORDER — NITROGLYCERIN 0.4 MG SL SUBL
0.8000 mg | SUBLINGUAL_TABLET | Freq: Once | SUBLINGUAL | Status: AC
  Administered 2024-07-09: 0.8 mg via SUBLINGUAL

## 2024-07-09 MED ORDER — IOHEXOL 350 MG/ML SOLN
100.0000 mL | Freq: Once | INTRAVENOUS | Status: AC | PRN
  Administered 2024-07-09: 100 mL via INTRAVENOUS

## 2024-07-09 NOTE — Therapy (Signed)
 OUTPATIENT PHYSICAL THERAPY TREATMENT NOTE   Patient Name: Brooke Cantrell MRN: [id number] DOB:Jun 04, 1946, 78 y.o., female Today's Date: 07/09/2024  END OF SESSION:  PT End of Session - 07/09/24 1014     Visit Number 26    Number of Visits 38    Date for Recertification  08/01/24    Authorization Type MEDICARE    Progress Note Due on Visit 33    PT Start Time 1014    PT Stop Time 1102    PT Time Calculation (min) 48 min    Activity Tolerance Patient tolerated treatment well;No increased pain    Behavior During Therapy [facility] for tasks assessed/performed                   Past Medical History:  Diagnosis Date   Aortic atherosclerosis 05/2024   Arthritis    Breast cancer (HCC)    Left breast   Depression    mild, no meds   History of kidney stones    passed the stone   HLD (hyperlipidemia)    Hypertension    Osteopenia 05/2024   Past Surgical History:  Procedure Laterality Date   breast cancer surgery     BREAST ENHANCEMENT SURGERY     BREAST LUMPECTOMY Left    CATARACT EXTRACTION, BILATERAL     ECTOPIC PREGNANCY SURGERY     EXAM UNDER ANESTHESIA WITH MANIPULATION OF KNEE Right 06/19/2024   Procedure: MANIPULATION, JOINT, KNEE, WITH ANESTHESIA;  Surgeon: Vernetta Lonni GRADE, MD;  Location: [facility] OR;  Service: Orthopedics;  Laterality: Right;  RIGHT KNEE MANIPULATION UNDER ANESTHESIA   EYE SURGERY     INNER EAR SURGERY     TONSILLECTOMY     TOTAL KNEE ARTHROPLASTY Right 04/22/2024   Procedure: ARTHROPLASTY, KNEE, TOTAL;  Surgeon: Vernetta Lonni GRADE, MD;  Location: [facility] OR;  Service: Orthopedics;  Laterality: Right;   TOTAL SHOULDER ARTHROPLASTY Left 04/25/2018   TOTAL SHOULDER ARTHROPLASTY Left 04/25/2018   Procedure: LEFT TOTAL SHOULDER REPLACEMENT;  Surgeon: Addie Cordella Hamilton, MD;  Location: [facility] OR;  Service: Orthopedics;  Laterality: Left;   TUBAL LIGATION     Patient Active Problem List   Diagnosis Date Noted   Left bundle branch block 06/23/2024    Dizziness 06/23/2024   Coronary artery calcification 06/23/2024   Arthrofibrosis of knee joint, right 06/18/2024   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   Weakness    Hyperlipidemia    Acute respiratory failure due to COVID-19 (HCC) 06/10/2020   Pneumonia due to COVID-19 virus 06/10/2020   Hx of multiple pulmonary nodules 06/10/2020   Acute respiratory failure with hypoxia (HCC)    Lung nodule seen on imaging study 01/08/2020   History of breast cancer 04/14/2019   Sepsis (HCC) 04/02/2019   CAP (community acquired pneumonia) 04/02/2019   Elevated troponin 04/02/2019   Shoulder arthritis 04/25/2018   Primary osteoarthritis, 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   Chronic left shoulder pain 05/17/2017   Cervical disc disorder with radiculopathy 08/14/2016   Chronic infection of sinus 08/31/2015   Cough 08/31/2015   Allergic reaction 08/31/2015    PCP: Alda Carpen, MD  REFERRING PROVIDER: Lonni GRADE Vernetta, MD  REFERRING DIAG: [phone] (ICD-10-CM) - Status post total right knee replacement  THERAPY DIAG:  Acute pain of right knee  Stiffness of right knee, not elsewhere classified  Localized edema  Muscle weakness (generalized)  Difficulty in walking, not elsewhere classified  Rationale for Evaluation and Treatment: Rehabilitation  ONSET DATE: 04/22/2024  SUBJECTIVE:  SUBJECTIVE STATEMENT: She has cardiac study at noon today and took prescribed medicine at 10:00am just before PT that is meant to slow her heart rate.    PERTINENT HISTORY: Lt breast cancer, kidney stones, HTN, Lt TSA 2019  PAIN:  NPRS scale:  achy, no specific number provided.  Location: Rt knee Pain description: Stiff, achy, rare sharp Aggravating factors: Prolonged postures and too much WB Relieving factors: Pain meds, ice, change positions frequently  PRECAUTIONS: None  RED FLAGS: None   WEIGHT BEARING RESTRICTIONS: No  FALLS:  Has patient fallen in last 6 months?  No  LIVING ENVIRONMENT: Lives with: lives with their family and grandson Lives in: House/apartment Stairs: Uses the rail with steps Has following equipment at home: Single point cane and Environmental Consultant - 2 wheeled  OCCUPATION: Retired  PLOF: Independent  PATIENT GOALS: Return to gardening her acre lot, weed, plant in the garden  OBJECTIVE:  Note: Objective measures were completed at Evaluation unless otherwise noted.  DIAGNOSTIC FINDINGS: Right knee arthroplasty in expected alignment. No periprosthetic lucency or fracture. There has been patellar resurfacing. Recent postsurgical change includes air and edema in the soft tissues and joint space. Anterior skin staples in place.  PATIENT SURVEYS:  PSFS: THE PATIENT SPECIFIC FUNCTIONAL SCALE  Place score of 0-10 (0 = unable to perform activity and 10 = able to perform activity at the same level as before injury or problem)  Activity Date:  05/09/2024 06/02/2024 06/26/24  07/02/24  Walk 2 5 8 8   2.  Garden 0 0 4 6  3.  Safely transfer to and from the ground 0 0 5 5  4.       Total Score 0.67 1.67 5.67 6.33    Total Score = Sum of activity scores/number of activities  Minimally Detectable Change: 3 points (for single activity); 2 points (for average score)  [name] (nd). The Patient Specific Functional Scale . Retrieved from [email or web address]   COGNITION: Overall cognitive status: Within functional limits for tasks assessed     SENSATION: WFL  EDEMA:  Noted and not objectively assessed  LOWER EXTREMITY ROM:   ROM Left/Right  05/09/2024 Rt 05/13/24 Right 05/20/24 Right 05/21/24 Right 05/26/24 Right 05/28/24 Rt 06/02/24 Right 06/04/24 Right  06/06/24 Rt 06/09/24 Right  06/13/24 Rt 06/19/24  Right 06/20/24 Right 06/25/24 Right 06/27/24 Right 07/02/24  Knee flexion 145/52 Supine:  A:  P:  Sitting:  P: 70 Supine AA: 76* Seated P: 78* Standing  P: flexion  stretch 82* Seated P: 87* Supine  A: 76deg A: 89  P: 95 Active 93 AROM: 87 PROM: 91 Active 97 AROM: 109deg A: 115 113 117 Seated P: 117* A: 110*  Knee extension 0/7     Seated P: -12*  LAQ Supine:  A: 6deg of flexion A: -11 (seated LAQ) Active lacks 5 AROM: 10deg flexion PROM: 8deg flexion  Active 5 AROM: 3deg PROM: 2deg    5 Lacks 5 LAQ -4* Standing  A: -2*   (Blank rows = not tested)  LOWER EXTREMITY STRENGTH:  In pounds with hand-held dynamometer Left/Right 05/09/2024 Right 06/04/24 Right 06/30/2024  Knee flexion     Knee extension 49.7/5.9 pounds 27.8# 30.8, 30.5 lbs   (Blank rows = not tested)  GAIT: 07/07/2024: Independent ambulation reported at home and in public.   Eval: Distance walked: 50 feet Assistive device utilized: Walker - 2 wheeled Level of assistance: Modified independence Comments: Brooke Cantrell is using her walker full-time at this point  TREATMENT       DATE: 07/09/2024 Therapeutic Exercise: SciFit Recumbent bike seat 8 pedal in notch #2 for longer lever arm / more knee flexion: 10 reps flexion stretch, backwards 1 min then forward full revolutions level 1 for 8 min.    Therapeutic Activities: Standing on airex RLE knee TKE blue theraband 10 reps 1st set TKE only, 2nd set TKE plus SLS lifting LLE up for 3-5 sec.   Squat lift 10# kettle bell 10 reps on floor, 5 reps standing uphill on ramp & 5 reps standing downhill on ramp.   Neuromuscular re-education: Stepping onto Airex and stepping contralateral LE from floor tapping knee to counter back to floor, alternating LEs 10 reps; initial 3 reps touching BLEs to sink, then progressed to single UE finger tip ipsilateral to stance LE last 7 reps  Side stepstepping BLEs onto mat to right & to left 10 reps with intermittent touch counter Side Stepping onto Airex and stepping contralateral LE from floor tapping knee to counter back to floor, 10 reps; single UE finger tip ipsilateral to stance LE both  to right & to left. Tandem stance on foam RLE in front & in back 30 sec 2 reps each with intermittent touch;  then 30 sec each with eyes closed with tactile cues / minA.   Vaso Right knee with elevation & knee ext stretch 34* 10 min medium compression.    TREATMENT       DATE: 07/07/2024 Therex: Recumbent bike partial circles for ROM knee , seat 6 for several minutes prior to full revolutions to finish out 8 mins total  Incline gastroc stretch 30 sec x 3 bilateral   TherActivity: (to improve squatting, transfers, stairs) Leg press double leg in available knee flexion range 87 lbs x 15 slow lowering focus Leg press single leg 37 lbs x 15 , performed bilaterally  Step on over and down WB on Rt leg 6 inch step x 10 with Lt hand assist on bar.   Neuro Re-ed Tandem stance on foam 1 min x 2 bilateral with occasional HHA on bars, SBA Tandem ambulation on foam fwd/back 6 f x 6 each way in // bars with occasional to moderate HHA, SBA  Vaso: Rt knee 34 deg medium compression in elevation on wedge 10 mins    TREATMENT       DATE: 07/03/2024 Therapeutic Exercise: Recumbent bike seat 6 flexion stretch 1 min, backwards 1 min then forward full revolutions level 1 for 8 min.    Therapeutic Activities: stepping up, over & down on BOSU round side up with 2 step lead for some momentum with BUE support // bars, 10 reps with each LE so RLE functions for stairs, uneven terrain and lands / accepts weight.   BOSU round side up lateral step up RLE with LLE reach anterior across midline (Rt knee extended) and posteriorly across midline (Rt knee unlocked / slight flexion) 10 reps with BUE support // bars Stairs 2 rails 11 steps alternating pattern slow controlled knee motion with PT cueing on technique Standing RLE knee TKE blue theraband 10 reps 1st set TKE only, 2nd set TKE plus SLS lifting LLE up for 3-5 sec.    Neuromuscular re-education: Tandem stance RLE in front 1st rep and in back 2nd rep:  1st set  floor eyes open, 2nd set floor eyes close & 3rd set foam eyes open.    Vaso Right knee with elevation & knee ext stretch 34* 10 min medium compression.  HOME EXERCISE PROGRAM: Access Code: [email or web address] URL: [email or web address] Access Code: [email or web address] URL: [email or web address] Date: 06/16/2024 Prepared by: Grayce Spatz  Exercises - Seated Knee Flexion AAROM  - 5 x daily - 7 x weekly - 1 sets - 10 reps - 10 seconds hold - Supine Quadricep Sets  - 10 x daily - 7 x weekly - 10 sets - 10 reps - 5 second hold - Seated Knee Flexion Extension AROM   - 2-4 x daily - 7 x weekly - 2-3 sets - 10 reps - 5 seconds hold - Seated Knee Flexion Extension AAROM with Overpressure  - 2-4 x daily - 7 x weekly - 2-3 sets - 10 reps - 5 seconds hold - Seated Long Arc Quad  - 2-4 x daily - 7 x weekly - 2-3 sets - 10 reps - 5 seconds hold - Supine Heel Slide with Strap  - 2-4 x daily - 7 x weekly - 2-3 sets - 10 reps - 5 seconds hold - Standing Knee Flexion Stretch on Step  - 2-3 x daily - 7 x weekly - 3-5 sets - 3 reps - 10 seconds hold - Gastroc Stretch on Step  - 1-3 x daily - 7 x weekly - 1 sets - 3 reps - 30 seconds hold - Standing Heel Raise  - 1-2 x daily - 7 x weekly - 2-3 sets - 10 reps - 2-3 seconds hold - Tandem Stance  - 1 x daily - 7 x weekly - 3 sets - 2 reps - 30 seconds hold - Seated Table Hamstring Stretch  - 1-3 x daily - 7 x weekly - 1 sets - 3 reps - 30 seconds hold - Seated Hamstring Stretch with Strap  - 1-2 x daily - 7 x weekly - 1 sets - 3 reps - 30 seconds hold - Seated Passive Knee Extension  - 2 x daily - 7 x weekly - 3-5 minutes hold - Prone Knee Extension with Ankle Weight  - 2 x daily - 7 x weekly - 3-5 minute hold - Supine Quadriceps Stretch with Strap on Table  - 1-3 x daily - 7 x weekly - 1 sets - 3 reps - 30 seconds hold  ASSESSMENT:  CLINICAL IMPRESSION: PT focused on functional activities & balance on compliant surfaces which was challenging to  her. Pt had increased localized edema to knee prior to PT today due to sudden increase in her activity level.     OBJECTIVE IMPAIRMENTS: Abnormal gait, decreased activity tolerance, decreased endurance, decreased knowledge of condition, difficulty walking, decreased ROM, decreased strength, increased edema, and pain.   ACTIVITY LIMITATIONS: bending, sitting, standing, squatting, sleeping, stairs, transfers, bed mobility, dressing, and locomotion level  PARTICIPATION LIMITATIONS: community activity and yard work  PERSONAL FACTORS: Lt breast cancer, kidney stones, HTN, Lt TSA 2019 are also affecting patient's functional outcome.   REHAB POTENTIAL: Good  CLINICAL DECISION MAKING: Stable/uncomplicated  EVALUATION COMPLEXITY: Low   GOALS: Goals reviewed with patient? Yes  SHORT TERM GOALS: Target date: 06/20/2024  Brooke Cantrell will be independent with her day 1 home exercise program Baseline: Started 05/09/2024 Goal status:   Met 06/06/2024  2.  Improve right knee active range of motion to 3 - 0 -95 degrees Baseline: 7 - 0 - 52 degrees Goal status: Partially Met 06/27/2024  3.  Improve right quadriceps strength to at least 30 pounds Baseline: 5.9 pounds Goal status: MET   06/30/2024   LONG TERM GOALS: Target date: 08/01/2024  Improve patient specific  functional score to at least 5 Baseline: 0.67 Goal status: Met 06/27/2024  2.  Brooke Cantrell will report right knee pain consistently 3/10 or better on the numeric pain rating scale Baseline: Can be as high as 8/10 Goal status: Met 06/25/2024  3.  Improve right knee AROM to 0-2-110 degrees (updated, 06/19/2024) Baseline: 0-2-109 06/19/2024 Goal status: Partially met 06/25/2024  4.  Improve bilateral quadriceps strength to 60 pounds or better Baseline: See objective Goal status: ongoing  07/09/2024  5.  Brooke Cantrell will be comfortable without the use of an assistive device within the house and with no more than a single-point cane outside the house at  transfer into independent rehabilitation Baseline: 2 wheeled walker full-time Goal status:  Met 06/13/2024  6.  We will be independent with a long-term home maintenance exercise program at discharge Baseline: Started 05/09/2024 Goal status: ongoing 07/09/2024   PLAN:  PT FREQUENCY: 2-3x/wk for 4 weeks   PT DURATION: 6 weeks   PLANNED INTERVENTIONS: 97750- Physical Performance Testing, 97110-Therapeutic exercises, 97530- Therapeutic activity, 97112- Neuromuscular re-education, 97535- Self Care, [id number]- Manual therapy, [phone]- Gait training, [phone]- Electrical stimulation (unattended), [id number]- Vasopneumatic device, Patient/Family education, Balance training, Stair training, Joint mobilization, and Cryotherapy  PLAN FOR NEXT SESSION:  check outcome of cardiac study,  continue with Compliant surface balance, WB strengthening. Vaso prn.    Grayce Spatz, PT, DPT 07/09/2024, 10:59 AM

## 2024-07-10 NOTE — Progress Notes (Signed)
 Ricka DELENA Abernethy returns for management of osteoporosis.  There were no vitals taken for this visit.  Assessment: Osteoporosis  Plan:  Prolia  60 mg SQ given today. In right arm\.  KNOX HOLDMAN was counseled about Prolia  today and advised that if any side effects such as pain at site or fever or other concerns develop, to please contact the office.   Ricka DELENA Abernethy was given a written Medication Guide about Prolia .   Prolia  was provided via buy/bill

## 2024-07-11 ENCOUNTER — Ambulatory Visit: Payer: Self-pay | Admitting: Student

## 2024-07-14 ENCOUNTER — Encounter: Payer: Self-pay | Admitting: Radiology

## 2024-07-15 ENCOUNTER — Encounter: Payer: Self-pay | Admitting: Physical Therapy

## 2024-07-15 ENCOUNTER — Telehealth: Payer: Self-pay | Admitting: Cardiology

## 2024-07-15 ENCOUNTER — Ambulatory Visit (INDEPENDENT_AMBULATORY_CARE_PROVIDER_SITE_OTHER): Admitting: Physical Therapy

## 2024-07-15 DIAGNOSIS — M6281 Muscle weakness (generalized): Secondary | ICD-10-CM

## 2024-07-15 DIAGNOSIS — M25661 Stiffness of right knee, not elsewhere classified: Secondary | ICD-10-CM | POA: Diagnosis not present

## 2024-07-15 DIAGNOSIS — R262 Difficulty in walking, not elsewhere classified: Secondary | ICD-10-CM

## 2024-07-15 DIAGNOSIS — R600 Localized edema: Secondary | ICD-10-CM

## 2024-07-15 DIAGNOSIS — M25561 Pain in right knee: Secondary | ICD-10-CM | POA: Diagnosis not present

## 2024-07-15 NOTE — Telephone Encounter (Signed)
 Received a call from patient she stated this morning she felt light headed.No chest pain. B/P elevated.Readings listed below.Pulse 124. B/P at present 144/87.Pulse 134.Medications reviewed and are listed correctly.Appointment scheduled with Hao Meng PA tomorrow 11/5 at 2:45 pm.Advised I will send message to Dr.Jordan.

## 2024-07-15 NOTE — Telephone Encounter (Signed)
  STAT if HR is under 50 or over 120  (normal HR is 60-100 beats per minute)  What is your heart rate? Hr 124   153/92  156/98 120/90 128/91  Do you have a log of your heart rate readings (document readings)?   Do you have any other symptoms?  No

## 2024-07-15 NOTE — Progress Notes (Unsigned)
 Cardiology Office Note:    Date:  07/16/2024   ID:  Brooke Cantrell, DOB 1946-02-05, MRN [id number]  PCP:  Alla Amis, MD   [facility] Providers Cardiologist:  Peter Jordan, MD     Referring MD: Alla Amis, MD   Chief complaint: Follow-up emergency department visit     History of Present Illness:   Brooke Cantrell is a 78 y.o. female with a hx of hypertension, aortic atherosclerosis, osteoporosis, presents to the office today following a visit for chest pain.  Patient had a total right knee arthroplasty on 04/22/2024.  Reports she could not do physical therapy the first 1.5 weeks due to scheduling issues with PT/home aide.  Was more immobile during that time.  Reports 1 episode of nausea and lightheadedness following physical therapy about a month after initial surgery, was transient, passed.  Had second knee surgery on 06/19/2024 due to arthrofibrosis of postsurgical knee joint that was treated with manipulation of that knee under anesthesia.  Patient arrived to the ED 06/23/2024 with complaints of nausea and dizziness, was unable to do exercises at physical therapy.  Also complained of nausea with vomiting the previous Friday.  EKG showed sinus rhythm, 86 bpm, LBBB with PVCs. HS troponins negative X2, BMP and CBC unremarkable, CXR showed no active disease, respiratory panel was negative, cardiology was consulted for LBBB in the setting of nausea, dizziness with no prior cardiac history.  Cardiology felt the patient was stable, noted that they observed LBBB aberrancy on EKG in 2020.  Dr. Jordan believed the LBBB was likely related to hypertension, considering no active cardiac symptoms the patient was felt to be stable for outpatient echo and ischemic workup with coronary CTA.   Coronary CTA on 07/09/2024: Minimal-mild nonobstructive CAD, CADRADS = 2.  Coronary artery calcium  score is 240, 71st percentile for age/race/sex matched controls.  Risk factor modification  including lipid-lowering target of LDL <70 recommended.  CT overread showed a 6 mm nodule density over the lingula likely part of the adjacent atelectasis, although may be discrete new nodule. Non-contrast chest CT at 6-12 months is recommended. If the nodule is stable at time of repeat CT, then future CT at 18-24 months (from today's scan) is considered optional for low-risk patients, but is recommended for high-risk patients.  Echo scheduled for 07/30/2024.  Presents with her husband to clinic today, appears stable from a cardiovascular standpoint.  Initially, on arrival, after walking from the car to her appointment, HR 138 w/ O2 sat at 92%.  After sitting for 5 minutes and talking with staff, HR lowered into the 80s and O2 sat increased.  Since her ED visit, she describes intermittent tachycardia with rates into the 140s at home, occasionally associated with nausea and lightheadedness, coming and going throughout the day.  Can be brought on with exertion and relieved with rest, but occasionally happens at rest.  States she noticed a band-like tightness around the circumference of her bra line yesterday, 2/10, lasting around 10 minutes, aside from this denies other forms of chest pain.  Notices she becomes more fatigued during her normal exercises, but she would not call it shortness of breath. Still performing physical therapy for her knee and walking regularly.  Denies any significant weight changes, orthopnea, dizziness, dark/tarry/bloody stools, hematuria, significant weight gain or loss.  Does report her right leg has been swollen since her 2 knee surgeries, with soreness extending from the knee into her right hip. Prior to knee surgery used to enjoy  gardening frequently.  ROS:   Please see the history of present illness.    All other systems reviewed and are negative.     Past Medical History:  Diagnosis Date   Aortic atherosclerosis 05/2024   Arthritis    Breast cancer (HCC)    Left breast    Depression    mild, no meds   History of kidney stones    passed the stone   HLD (hyperlipidemia)    Hypertension    Osteopenia 05/2024    Past Surgical History:  Procedure Laterality Date   breast cancer surgery     BREAST ENHANCEMENT SURGERY     BREAST LUMPECTOMY Left    CATARACT EXTRACTION, BILATERAL     ECTOPIC PREGNANCY SURGERY     EXAM UNDER ANESTHESIA WITH MANIPULATION OF KNEE Right 06/19/2024   Procedure: MANIPULATION, JOINT, KNEE, WITH ANESTHESIA;  Surgeon: Vernetta Lonni GRADE, MD;  Location: [facility] OR;  Service: Orthopedics;  Laterality: Right;  RIGHT KNEE MANIPULATION UNDER ANESTHESIA   EYE SURGERY     INNER EAR SURGERY     TONSILLECTOMY     TOTAL KNEE ARTHROPLASTY Right 04/22/2024   Procedure: ARTHROPLASTY, KNEE, TOTAL;  Surgeon: Vernetta Lonni GRADE, MD;  Location: [facility] OR;  Service: Orthopedics;  Laterality: Right;   TOTAL SHOULDER ARTHROPLASTY Left 04/25/2018   TOTAL SHOULDER ARTHROPLASTY Left 04/25/2018   Procedure: LEFT TOTAL SHOULDER REPLACEMENT;  Surgeon: Addie Cordella Hamilton, MD;  Location[facility];  Service: Orthopedics;  Laterality: Left;   TUBAL LIGATION      Current Medications: Current Meds  Medication Sig   aspirin  81 MG chewable tablet Chew 1 tablet (81 mg total) by mouth 2 (two) times daily. (Patient taking differently: Chew 81 mg by mouth once.)   calcium  citrate-vitamin D  (CITRACAL+D) 315-200 MG-UNIT tablet Take 2 tablets by mouth 2 (two) times daily with a meal.   chlorthalidone  (HYGROTON ) 25 MG tablet Take 25 mg by mouth daily.   cholecalciferol  (VITAMIN D3) 25 MCG (1000 UNIT) tablet Take 1,000 Units by mouth in the morning and at bedtime.   Cyanocobalamin (B-12 PO) Take by mouth.   D-Mannose (MANNXTRA PO) Take 1 capsule by mouth daily. (Patient taking differently: Take 2 capsules by mouth daily.)   ezetimibe  (ZETIA ) 10 MG tablet Take 10 mg by mouth daily.   metoprolol succinate (TOPROL XL) 25 MG 24 hr tablet Take 1 tablet (25 mg total) by mouth  daily.   Multiple Vitamin (MULTIVITAMIN) capsule Take 1 capsule by mouth daily.   Omega-3 Fatty Acids (OMEGA 3 PO) Take by mouth 2 (two) times daily.     Allergies:   Ciprofloxacin and Oxycodone    Social History   Socioeconomic History   Marital status: Married    Spouse name: Not on file   Number of children: Not on file   Years of education: Not on file   Highest education level: Not on file  Occupational History   Not on file  Tobacco Use   Smoking status: Former    Current packs/day: 0.00    Types: Cigarettes    Quit date: 09/11/1996    Years since quitting: 27.8   Smokeless tobacco: Never  Vaping Use   Vaping status: Never Used  Substance and Sexual Activity   Alcohol use: Yes    Alcohol/week: 5.0 - 7.0 standard drinks of alcohol    Types: 5 - 7 Standard drinks or equivalent per week    Comment: 5-7 per week, when going out eat  Drug use: Never   Sexual activity: Not Currently    Birth control/protection: Post-menopausal  Other Topics Concern   Not on file  Social History Narrative   Not on file   Social Drivers of Health   Financial Resource Strain: Low Risk  (08/01/2023)   Received from [facility]   Overall Financial Resource Strain (CARDIA)    Difficulty of Paying Living Expenses: Not hard at all  Food Insecurity: No Food Insecurity (08/01/2023)   Received from [facility]   Hunger Vital Sign    Within the past 12 months, you worried that your food would run out before you got the money to buy more.: Never true    Within the past 12 months, the food you bought just didn't last and you didn't have money to get more.: Never true  Transportation Needs: No Transportation Needs (08/01/2023)   Received from [facility] - Transportation    In the past 12 months, has lack of transportation kept you from medical appointments or from getting medications?: No    Lack of Transportation (Non-Medical): No   Physical Activity: Not on file  Stress: Not on file  Social Connections: Not on file     Family History: The patient's family history includes Allergic rhinitis in her brother, father, and sister. There is no history of Breast cancer.  EKGs/Labs/Other Studies Reviewed:    The following studies were reviewed today:  EKG Interpretation Date/Time:  Wednesday July 16 2024 14:52:52 EST Ventricular Rate:  86 PR Interval:  160 QRS Duration:  104 QT Interval:  382 QTC Calculation: 457 R Axis:   -7  Text Interpretation: Sinus rhythm with occasional Premature ventricular complexes and Premature atrial complexes Left ventricular hypertrophy with repolarization abnormality ( Cornell product ) LBBB resolved from prior study ST depression in lateral leads present in prior studies Reconfirmed by Tobe Kervin [phone]) on 07/16/2024 6:41:13 PM    Recent Labs: 04/18/2024: ALT 18 06/23/2024: BUN 17; Creatinine, Ser 0.86; Hemoglobin 14.3; Platelets 274; Potassium 3.5; Sodium 135  Recent Lipid Panel No results found for: CHOL, TRIG, HDL, CHOLHDL, VLDL, LDLCALC, LDLDIRECT    Physical Exam:    VS:  BP (!) 143/88 (BP Location: Right Arm, Patient Position: Standing, Cuff Size: Normal) Comment (Patient Position): 3 min standing  Pulse 87   Ht 5' 4 (1.626 m)   Wt 145 lb 9.6 oz (66 kg)   SpO2 96%   BMI 24.99 kg/m        Wt Readings from Last 3 Encounters:  07/16/24 145 lb 9.6 oz (66 kg)  06/23/24 140 lb (63.5 kg)  06/19/24 140 lb (63.5 kg)     GEN:  Well nourished, well developed in no acute distress HEENT: Normal NECK:  No carotid bruits CARDIAC:  S1-S2 normal, RRR, no murmurs, rubs, gallops RESPIRATORY:  Clear to auscultation without rales, wheezing or rhonchi  MUSCULOSKELETAL:  Mild swelling present to right knee/thigh, trivial amount in right lower leg; No deformity, palpable DP pulses bilaterally SKIN: Warm and dry NEUROLOGIC:  Alert and oriented x 3 PSYCHIATRIC:   Normal affect       Assessment & Plan Tachycardia LBBB (left bundle branch block) Fatigue, unspecified type DOE (dyspnea on exertion) cCTA 07/09/2024: Thoracic aorta is normal in caliber. Minimal calcified plaque over the descending thoracic aorta. Pulmonary arterial system is unremarkable  EKG: Sinus rhythm with occasional Premature ventricular complexes and Premature atrial complexes, Left ventricular hypertrophy with repolarization  abnormality, LBBB resolved from prior study, ST depression in lateral leads present in prior studies On arrival patient heart rate was 138, O2 sat 92% initially.  HR decreased into the 80s after sitting and talking for 5 minutes, O2 sat increased to 96%.  Patient reports heart rates that occasionally spike into the 140s at home for short period of time, often associated with lightheadedness, blood pressures during these moments are typically in the 140s/90s during these events, can be exacerbated with activity and relieved with rest, but also occasionally occur spontaneously at rest Considering transient LBBB, episodic tachycardia, decreased O2 sat, 2 recent orthopedic surgeries (where patient was sedentary for a brief period of time following first surgery), fatigue, mild DOE, will order urgent CTA of chest to rule out pulmonary embolism Given the patient's overall well appearance, normalization of vital signs, transient nature of these episodes, and overall length of time since her prior surgeries, thorough review of cCTA images and discussion of case with Scot Ford, PA, I believe the patient is stable enough for outpatient workup. Treatment will vary pending results of CT scan If positive, may also require further evaluation for DVT If scan is negative for PE, would recommend zio monitoring to rule out arrhythmia at following appointment.  Will prescribe Toprol-XL 25 mg daily to help reduce episodes of tachycardia/improve hypertension Will order TSH w/ reflex to  T3/free T4 to rule out thyroid abnormalities Coronary artery disease involving native coronary artery of native heart, unspecified whether angina present Coronary CTA on 07/09/2024: Minimal-mild nonobstructive CAD, CADRADS = 2.  Coronary artery calcium  score is 240, 71st percentile for age/race/sex matched controls. EKG: Sinus rhythm with ectopy as above Described a band-like tightness around the circumference of her chest/bra line occurring for 10 minutes yesterday, 2/10, reports fatigue, occasional lightheadedness, nausea, mild DOE Low suspicion for ischemia given minimal-mild nonobstructive CAD on CCTA Continue aspirin  81 mg daily Will need to discuss statin therapy at follow-up visit Primary hypertension BPs averaging in the 130s-140s at home Will start Toprol XL 25 mg daily Lung nodule seen on imaging study 07/09/2024 CT overread showed a 6 mm nodule density over the lingula likely part of the adjacent atelectasis, although may be discrete new nodule. Non-contrast chest CT at 6-12 months is recommended. If the nodule is stable at time of repeat CT, then future CT at 18-24 months (from today's scan) is considered optional for low-risk patients, but is recommended for high-risk patients. Patient reports being a 1.5 PPD X 10 years in her late 42s and 30s Patient made aware of results and need for repeat imaging in the future, advised to discuss this with her PCP  Proceed to ED if symptoms change/worsen Follow up in 3 weeks with Dr. Jordan      Medication Adjustments/Labs and Tests Ordered: Current medicines are reviewed at length with the patient today.  Concerns regarding medicines are outlined above.  Orders Placed This Encounter  Procedures   CT Angio Chest Pulmonary Embolism (PE) W or WO Contrast   TSH+T4F+T3Free   EKG 12-Lead   Meds ordered this encounter  Medications   metoprolol succinate (TOPROL XL) 25 MG 24 hr tablet    Sig: Take 1 tablet (25 mg total) by mouth daily.     Dispense:  90 tablet    Refill:  3    Patient Instructions  Medication Instructions:  START: Metoprolol Succinate 25 mg (1 tablet) daily *If you need a refill on your cardiac medications before your next appointment, please  call your pharmacy*  Lab Work: Today TSH If you have labs (blood work) drawn today and your tests are completely normal, you will receive your results only by: MyChart Message (if you have MyChart) OR A paper copy in the mail If you have any lab test that is abnormal or we need to change your treatment, we will call you to review the results.  Testing/Procedures: Non-Cardiac CT Angiography (CTA), is a special type of CT scan that uses a computer to produce multi-dimensional views of major blood vessels throughout the body. In CT angiography, a contrast material is injected through an IV to help visualize the blood vessels   Follow-Up: At [facility], you and your health needs are our priority.  As part of our continuing mission to provide you with exceptional heart care, our providers are all part of one team.  This team includes your primary Cardiologist (physician) and Advanced Practice Providers or APPs (Physician Assistants and Nurse Practitioners) who all work together to provide you with the care you need, when you need it.  Your next appointment:   Keep appointment 07/31/24 with Dr. Jordan  We recommend signing up for the patient portal called MyChart.  Sign up information is provided on this After Visit Summary.  MyChart is used to connect with patients for Virtual Visits (Telemedicine).  Patients are able to view lab/test results, encounter notes, upcoming appointments, etc.  Non-urgent messages can be sent to your provider as well.   To learn more about what you can do with MyChart, go to [email or web address].              Signed, Miriam FORBES Shams, NP  07/16/2024 7:40 PM    [facility]

## 2024-07-15 NOTE — Therapy (Signed)
 OUTPATIENT PHYSICAL THERAPY TREATMENT NOTE   Patient Name: Brooke Cantrell MRN: [id number] DOB:1945/11/02, 78 y.o., female Today's Date: 07/15/2024  END OF SESSION:  PT End of Session - 07/15/24 0853     Visit Number 27    Number of Visits 38    Date for Recertification  08/01/24    Authorization Type MEDICARE    Progress Note Due on Visit 33    PT Start Time 0846    PT Stop Time 0926    PT Time Calculation (min) 40 min    Activity Tolerance Patient tolerated treatment well;No increased pain    Behavior During Therapy [facility] for tasks assessed/performed                    Past Medical History:  Diagnosis Date   Aortic atherosclerosis 05/2024   Arthritis    Breast cancer (HCC)    Left breast   Depression    mild, no meds   History of kidney stones    passed the stone   HLD (hyperlipidemia)    Hypertension    Osteopenia 05/2024   Past Surgical History:  Procedure Laterality Date   breast cancer surgery     BREAST ENHANCEMENT SURGERY     BREAST LUMPECTOMY Left    CATARACT EXTRACTION, BILATERAL     ECTOPIC PREGNANCY SURGERY     EXAM UNDER ANESTHESIA WITH MANIPULATION OF KNEE Right 06/19/2024   Procedure: MANIPULATION, JOINT, KNEE, WITH ANESTHESIA;  Surgeon: Vernetta Lonni GRADE, MD;  Location: [facility] OR;  Service: Orthopedics;  Laterality: Right;  RIGHT KNEE MANIPULATION UNDER ANESTHESIA   EYE SURGERY     INNER EAR SURGERY     TONSILLECTOMY     TOTAL KNEE ARTHROPLASTY Right 04/22/2024   Procedure: ARTHROPLASTY, KNEE, TOTAL;  Surgeon: Vernetta Lonni GRADE, MD;  Location: [facility] OR;  Service: Orthopedics;  Laterality: Right;   TOTAL SHOULDER ARTHROPLASTY Left 04/25/2018   TOTAL SHOULDER ARTHROPLASTY Left 04/25/2018   Procedure: LEFT TOTAL SHOULDER REPLACEMENT;  Surgeon: Addie Cordella Hamilton, MD;  Location: [facility] OR;  Service: Orthopedics;  Laterality: Left;   TUBAL LIGATION     Patient Active Problem List   Diagnosis Date Noted   Left bundle branch block 06/23/2024    Dizziness 06/23/2024   Coronary artery calcification 06/23/2024   Arthrofibrosis of knee joint, right 06/18/2024   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   Weakness    Hyperlipidemia    Acute respiratory failure due to COVID-19 (HCC) 06/10/2020   Pneumonia due to COVID-19 virus 06/10/2020   Hx of multiple pulmonary nodules 06/10/2020   Acute respiratory failure with hypoxia (HCC)    Lung nodule seen on imaging study 01/08/2020   History of breast cancer 04/14/2019   Sepsis (HCC) 04/02/2019   CAP (community acquired pneumonia) 04/02/2019   Elevated troponin 04/02/2019   Shoulder arthritis 04/25/2018   Primary osteoarthritis, 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   Chronic left shoulder pain 05/17/2017   Cervical disc disorder with radiculopathy 08/14/2016   Chronic infection of sinus 08/31/2015   Cough 08/31/2015   Allergic reaction 08/31/2015    PCP: Alda Carpen, MD  REFERRING PROVIDER: Lonni GRADE Vernetta, MD  REFERRING DIAG: [id number] (ICD-10-CM) - Status post total right knee replacement  THERAPY DIAG:  Acute pain of right knee  Stiffness of right knee, not elsewhere classified  Localized edema  Muscle weakness (generalized)  Difficulty in walking, not elsewhere classified  Rationale for Evaluation and Treatment: Rehabilitation  ONSET DATE: 04/22/2024  SUBJECTIVE:   SUBJECTIVE STATEMENT: Walked for 25 min yesterday then went to the mall; still having quite a bit of knee stiffness.   PERTINENT HISTORY: Lt breast cancer, kidney stones, HTN, Lt TSA 2019  PAIN:  NPRS scale:  achy, no specific number provided.  Location: Rt knee Pain description: Stiff, achy, rare sharp Aggravating factors: Prolonged postures and too much WB Relieving factors: Pain meds, ice, change positions frequently  PRECAUTIONS: None  RED FLAGS: None   WEIGHT BEARING RESTRICTIONS: No  FALLS:  Has patient fallen in last 6 months? No  LIVING ENVIRONMENT: Lives with:  lives with their family and grandson Lives in: House/apartment Stairs: Uses the rail with steps Has following equipment at home: Single point cane and Environmental Consultant - 2 wheeled  OCCUPATION: Retired  PLOF: Independent  PATIENT GOALS: Return to gardening her acre lot, weed, plant in the garden  OBJECTIVE:  Note: Objective measures were completed at Evaluation unless otherwise noted.  DIAGNOSTIC FINDINGS: Right knee arthroplasty in expected alignment. No periprosthetic lucency or fracture. There has been patellar resurfacing. Recent postsurgical change includes air and edema in the soft tissues and joint space. Anterior skin staples in place.  PATIENT SURVEYS:  PSFS: THE PATIENT SPECIFIC FUNCTIONAL SCALE  Place score of 0-10 (0 = unable to perform activity and 10 = able to perform activity at the same level as before injury or problem)  Activity Date:  05/09/2024 06/02/2024 06/26/24  07/02/24  Walk 2 5 8 8   2.  Garden 0 0 4 6  3.  Safely transfer to and from the ground 0 0 5 5  4.       Total Score 0.67 1.67 5.67 6.33    Total Score = Sum of activity scores/number of activities  Minimally Detectable Change: 3 points (for single activity); 2 points (for average score)  [name] (nd). The Patient Specific Functional Scale . Retrieved from [email or web address]   COGNITION: Overall cognitive status: Within functional limits for tasks assessed     SENSATION: WFL  EDEMA:  Noted and not objectively assessed  LOWER EXTREMITY ROM:   ROM Left/Right  05/09/2024 Rt 05/13/24 Right 05/20/24 Right 05/21/24 Right 05/26/24 Right 05/28/24 Rt 06/02/24 Right 06/04/24 Right  06/06/24 Rt 06/09/24 Right  06/13/24 Rt 06/19/24  Right 06/20/24 Right 06/25/24 Right 06/27/24 Right 07/02/24  Knee flexion 145/52 Supine:  A:  P:  Sitting:  P: 70 Supine AA: 76* Seated P: 78* Standing  P: flexion stretch 82* Seated P: 87* Supine  A:  76deg A: 89  P: 95 Active 93 AROM: 87 PROM: 91 Active 97 AROM: 109deg A: 115 113 117 Seated P: 117* A: 110*  Knee extension 0/7     Seated P: -12*  LAQ Supine:  A: 6deg of flexion A: -11 (seated LAQ) Active lacks 5 AROM: 10deg flexion PROM: 8deg flexion  Active 5 AROM: 3deg PROM: 2deg    5 Lacks 5 LAQ -4* Standing  A: -2*   (Blank rows = not tested)  LOWER EXTREMITY STRENGTH:  In pounds with hand-held dynamometer Left/Right 05/09/2024 Right 06/04/24 Right 06/30/2024  Knee flexion     Knee extension 49.7/5.9 pounds 27.8# 30.8, 30.5 lbs   (Blank rows = not tested)  GAIT: 07/07/2024: Independent ambulation reported at home and in public.   Eval: Distance walked: 50 feet Assistive device utilized: Walker - 2 wheeled Level of assistance: Modified independence Comments: Keymoni is using her walker full-time at this point  TREATMENT       DATE:  07/15/24 TherEx SciFit bike L6 x 8 min; LEs only  Neuro Re-Ed Sidestepping on foam beam x 5 laps, intermittent UE support needed Tandem walking fwd/bwd on foam beam x 5 laps; UE support needed Forward step ups foam to 8 step 2x10 bil; intermittent UE support  TherAct Leg press RLE only 50# 3x10 Sit to/from stand with 15# KB 2x10 RDL 15# 2x10   07/09/2024 Therapeutic Exercise: SciFit Recumbent bike seat 8 pedal in notch #2 for longer lever arm / more knee flexion: 10 reps flexion stretch, backwards 1 min then forward full revolutions level 1 for 8 min.    Therapeutic Activities: Standing on airex RLE knee TKE blue theraband 10 reps 1st set TKE only, 2nd set TKE plus SLS lifting LLE up for 3-5 sec.   Squat lift 10# kettle bell 10 reps on floor, 5 reps standing uphill on ramp & 5 reps standing downhill on ramp.   Neuromuscular re-education: Stepping onto Airex and stepping contralateral LE from floor tapping knee to counter back to floor, alternating LEs 10 reps; initial 3 reps touching BLEs to sink, then  progressed to single UE finger tip ipsilateral to stance LE last 7 reps  Side stepstepping BLEs onto mat to right & to left 10 reps with intermittent touch counter Side Stepping onto Airex and stepping contralateral LE from floor tapping knee to counter back to floor, 10 reps; single UE finger tip ipsilateral to stance LE both to right & to left. Tandem stance on foam RLE in front & in back 30 sec 2 reps each with intermittent touch;  then 30 sec each with eyes closed with tactile cues / minA.   Vaso Right knee with elevation & knee ext stretch 34* 10 min medium compression.   07/07/2024 Therex: Recumbent bike partial circles for ROM knee , seat 6 for several minutes prior to full revolutions to finish out 8 mins total  Incline gastroc stretch 30 sec x 3 bilateral   TherActivity: (to improve squatting, transfers, stairs) Leg press double leg in available knee flexion range 87 lbs x 15 slow lowering focus Leg press single leg 37 lbs x 15 , performed bilaterally  Step on over and down WB on Rt leg 6 inch step x 10 with Lt hand assist on bar.   Neuro Re-ed Tandem stance on foam 1 min x 2 bilateral with occasional HHA on bars, SBA Tandem ambulation on foam fwd/back 6 f x 6 each way in // bars with occasional to moderate HHA, SBA  Vaso: Rt knee 34 deg medium compression in elevation on wedge 10 mins    07/03/2024 Therapeutic Exercise: Recumbent bike seat 6 flexion stretch 1 min, backwards 1 min then forward full revolutions level 1 for 8 min.    Therapeutic Activities: stepping up, over & down on BOSU round side up with 2 step lead for some momentum with BUE support // bars, 10 reps with each LE so RLE functions for stairs, uneven terrain and lands / accepts weight.   BOSU round side up lateral step up RLE with LLE reach anterior across midline (Rt knee extended) and posteriorly across midline (Rt knee unlocked / slight flexion) 10 reps with BUE support // bars Stairs 2 rails 11 steps  alternating pattern slow controlled knee motion with PT cueing on technique Standing RLE knee TKE blue theraband 10 reps 1st set TKE only, 2nd set TKE plus SLS lifting LLE up for 3-5  sec.    Neuromuscular re-education: Tandem stance RLE in front 1st rep and in back 2nd rep:  1st set floor eyes open, 2nd set floor eyes close & 3rd set foam eyes open.    Vaso Right knee with elevation & knee ext stretch 34* 10 min medium compression.     HOME EXERCISE PROGRAM: Access Code: [email or web address] URL: [email or web address] Access Code: [email or web address] URL: [email or web address] Date: 06/16/2024 Prepared by: Grayce Spatz  Exercises - Seated Knee Flexion AAROM  - 5 x daily - 7 x weekly - 1 sets - 10 reps - 10 seconds hold - Supine Quadricep Sets  - 10 x daily - 7 x weekly - 10 sets - 10 reps - 5 second hold - Seated Knee Flexion Extension AROM   - 2-4 x daily - 7 x weekly - 2-3 sets - 10 reps - 5 seconds hold - Seated Knee Flexion Extension AAROM with Overpressure  - 2-4 x daily - 7 x weekly - 2-3 sets - 10 reps - 5 seconds hold - Seated Long Arc Quad  - 2-4 x daily - 7 x weekly - 2-3 sets - 10 reps - 5 seconds hold - Supine Heel Slide with Strap  - 2-4 x daily - 7 x weekly - 2-3 sets - 10 reps - 5 seconds hold - Standing Knee Flexion Stretch on Step  - 2-3 x daily - 7 x weekly - 3-5 sets - 3 reps - 10 seconds hold - Gastroc Stretch on Step  - 1-3 x daily - 7 x weekly - 1 sets - 3 reps - 30 seconds hold - Standing Heel Raise  - 1-2 x daily - 7 x weekly - 2-3 sets - 10 reps - 2-3 seconds hold - Tandem Stance  - 1 x daily - 7 x weekly - 3 sets - 2 reps - 30 seconds hold - Seated Table Hamstring Stretch  - 1-3 x daily - 7 x weekly - 1 sets - 3 reps - 30 seconds hold - Seated Hamstring Stretch with Strap  - 1-2 x daily - 7 x weekly - 1 sets - 3 reps - 30 seconds hold - Seated Passive Knee Extension  - 2 x daily - 7 x weekly - 3-5 minutes hold - Prone Knee Extension with Ankle Weight  -  2 x daily - 7 x weekly - 3-5 minute hold - Supine Quadriceps Stretch with Strap on Table  - 1-3 x daily - 7 x weekly - 1 sets - 3 reps - 30 seconds hold  ASSESSMENT:  CLINICAL IMPRESSION: Pt tolerated session well today; she still reports challenges with balance and stairs.  Anticipate we are nearing d/c but will continue to benefit from PT to maximize function.      OBJECTIVE IMPAIRMENTS: Abnormal gait, decreased activity tolerance, decreased endurance, decreased knowledge of condition, difficulty walking, decreased ROM, decreased strength, increased edema, and pain.   ACTIVITY LIMITATIONS: bending, sitting, standing, squatting, sleeping, stairs, transfers, bed mobility, dressing, and locomotion level  PARTICIPATION LIMITATIONS: community activity and yard work  PERSONAL FACTORS: Lt breast cancer, kidney stones, HTN, Lt TSA 2019 are also affecting patient's functional outcome.   REHAB POTENTIAL: Good  CLINICAL DECISION MAKING: Stable/uncomplicated  EVALUATION COMPLEXITY: Low   GOALS: Goals reviewed with patient? Yes  SHORT TERM GOALS: Target date: 06/20/2024  Tienna will be independent with her day 1 home exercise program Baseline: Started 05/09/2024 Goal status:   Met 06/06/2024  2.  Improve right knee active range of  motion to 3 - 0 -95 degrees Baseline: 7 - 0 - 52 degrees Goal status: Partially Met 06/27/2024  3.  Improve right quadriceps strength to at least 30 pounds Baseline: 5.9 pounds Goal status: MET   06/30/2024   LONG TERM GOALS: Target date: 08/01/2024  Improve patient specific functional score to at least 5 Baseline: 0.67 Goal status: Met 06/27/2024  2.  Antha will report right knee pain consistently 3/10 or better on the numeric pain rating scale Baseline: Can be as high as 8/10 Goal status: Met 06/25/2024  3.  Improve right knee AROM to 0-2-110 degrees (updated, 06/19/2024) Baseline: 0-2-109 06/19/2024 Goal status: Partially met 06/25/2024  4.  Improve  bilateral quadriceps strength to 60 pounds or better Baseline: See objective Goal status: ongoing  07/09/2024  5.  Jacquelyne will be comfortable without the use of an assistive device within the house and with no more than a single-point cane outside the house at transfer into independent rehabilitation Baseline: 2 wheeled walker full-time Goal status:  Met 06/13/2024  6.  We will be independent with a long-term home maintenance exercise program at discharge Baseline: Started 05/09/2024 Goal status: ongoing 07/09/2024   PLAN:  PT FREQUENCY: 2-3x/wk for 4 weeks   PT DURATION: 6 weeks   PLANNED INTERVENTIONS: 97750- Physical Performance Testing, 97110-Therapeutic exercises, 97530- Therapeutic activity, 97112- Neuromuscular re-education, 97535- Self Care, 02859- Manual therapy, [phone]- Gait training, [phone]- Electrical stimulation (unattended), 97016- Vasopneumatic device, Patient/Family education, Balance training, Stair training, Joint mobilization, and Cryotherapy  PLAN FOR NEXT SESSION: practice stairs, continue with Compliant surface balance, WB strengthening. Vaso prn.    Corean JULIANNA Ku, PT, DPT 07/15/2024, 9:26 AM

## 2024-07-16 ENCOUNTER — Ambulatory Visit: Attending: Physician Assistant | Admitting: Emergency Medicine

## 2024-07-16 ENCOUNTER — Encounter: Payer: Self-pay | Admitting: Emergency Medicine

## 2024-07-16 VITALS — BP 143/88 | HR 87 | Ht 64.0 in | Wt 145.6 lb

## 2024-07-16 DIAGNOSIS — I10 Essential (primary) hypertension: Secondary | ICD-10-CM | POA: Insufficient documentation

## 2024-07-16 DIAGNOSIS — I2510 Atherosclerotic heart disease of native coronary artery without angina pectoris: Secondary | ICD-10-CM | POA: Insufficient documentation

## 2024-07-16 DIAGNOSIS — R5383 Other fatigue: Secondary | ICD-10-CM | POA: Insufficient documentation

## 2024-07-16 DIAGNOSIS — R0609 Other forms of dyspnea: Secondary | ICD-10-CM | POA: Diagnosis present

## 2024-07-16 DIAGNOSIS — I447 Left bundle-branch block, unspecified: Secondary | ICD-10-CM | POA: Insufficient documentation

## 2024-07-16 DIAGNOSIS — R000 Tachycardia, unspecified: Secondary | ICD-10-CM | POA: Insufficient documentation

## 2024-07-16 DIAGNOSIS — R911 Solitary pulmonary nodule: Secondary | ICD-10-CM | POA: Diagnosis present

## 2024-07-16 MED ORDER — METOPROLOL SUCCINATE ER 25 MG PO TB24: 25.0000 mg | ORAL_TABLET | Freq: Every day | ORAL | 3 refills | Status: DC

## 2024-07-16 NOTE — Assessment & Plan Note (Addendum)
 07/09/2024 CT overread showed a 6 mm nodule density over the lingula likely part of the adjacent atelectasis, although may be discrete new nodule. Non-contrast chest CT at 6-12 months is recommended. If the nodule is stable at time of repeat CT, then future CT at 18-24 months (from today's scan) is considered optional for low-risk patients, but is recommended for high-risk patients. 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

## 2024-07-16 NOTE — Patient Instructions (Signed)
 Medication Instructions:  START: Metoprolol Succinate 25 mg (1 tablet) daily *If you need a refill on your cardiac medications before your next appointment, please call your pharmacy*  Lab Work: Today TSH If you have labs (blood work) drawn today and your tests are completely normal, you will receive your results only by: MyChart Message (if you have MyChart) OR A paper copy in the mail If you have any lab test that is abnormal or we need to change your treatment, we will call you to review the results.  Testing/Procedures: Non-Cardiac CT Angiography (CTA), is a special type of CT scan that uses a computer to produce multi-dimensional views of major blood vessels throughout the body. In CT angiography, a contrast material is injected through an IV to help visualize the blood vessels   Follow-Up: At [facility], you and your health needs are our priority.  As part of our continuing mission to provide you with exceptional heart care, our providers are all part of one team.  This team includes your primary Cardiologist (physician) and Advanced Practice Providers or APPs (Physician Assistants and Nurse Practitioners) who all work together to provide you with the care you need, when you need it.  Your next appointment:   Keep appointment 07/31/24 with Dr. Jordan  We recommend signing up for the patient portal called MyChart.  Sign up information is provided on this After Visit Summary.  MyChart is used to connect with patients for Virtual Visits (Telemedicine).  Patients are able to view lab/test results, encounter notes, upcoming appointments, etc.  Non-urgent messages can be sent to your provider as well.   To learn more about what you can do with MyChart, go to [email or web address].

## 2024-07-16 NOTE — Telephone Encounter (Signed)
 Seeking clarification before I give the pt a call. Chest pain is likely or not likely from her heart. Please advise

## 2024-07-17 ENCOUNTER — Encounter: Payer: Self-pay | Admitting: Physical Therapy

## 2024-07-17 ENCOUNTER — Ambulatory Visit: Payer: Self-pay | Admitting: Emergency Medicine

## 2024-07-17 ENCOUNTER — Ambulatory Visit (INDEPENDENT_AMBULATORY_CARE_PROVIDER_SITE_OTHER): Admitting: Physical Therapy

## 2024-07-17 ENCOUNTER — Ambulatory Visit (HOSPITAL_COMMUNITY)
Admission: RE | Admit: 2024-07-17 | Discharge: 2024-07-17 | Disposition: A | Discharge status: Home / Self Care | Source: Ambulatory Visit | Attending: Emergency Medicine | Admitting: Emergency Medicine

## 2024-07-17 DIAGNOSIS — M6281 Muscle weakness (generalized): Secondary | ICD-10-CM | POA: Diagnosis not present

## 2024-07-17 DIAGNOSIS — R262 Difficulty in walking, not elsewhere classified: Secondary | ICD-10-CM

## 2024-07-17 DIAGNOSIS — R600 Localized edema: Secondary | ICD-10-CM

## 2024-07-17 DIAGNOSIS — M25661 Stiffness of right knee, not elsewhere classified: Secondary | ICD-10-CM | POA: Diagnosis not present

## 2024-07-17 DIAGNOSIS — R000 Tachycardia, unspecified: Secondary | ICD-10-CM | POA: Diagnosis present

## 2024-07-17 DIAGNOSIS — M25561 Pain in right knee: Secondary | ICD-10-CM

## 2024-07-17 LAB — TSH+T4F+T3FREE
Free T4: 1.4 ng/dL (ref 0.82–1.77)
T3, Free: 2.6 pg/mL (ref 2.0–4.4)
TSH: 2.31 u[IU]/mL (ref 0.450–4.500)

## 2024-07-17 MED ORDER — IOHEXOL 350 MG/ML SOLN
80.0000 mL | Freq: Once | INTRAVENOUS | Status: AC | PRN
  Administered 2024-07-17: 80 mL via INTRAVENOUS

## 2024-07-17 NOTE — Telephone Encounter (Signed)
 Spoke with pt regarding test results. Pt verbalized understanding and agreed to plan. Pt stated she reschedule her appt with Damien, and she is now schedule to to see Dr Jordan on 11/20. Pt was advise to call our office if she has any further questions.

## 2024-07-17 NOTE — Therapy (Signed)
 OUTPATIENT PHYSICAL THERAPY TREATMENT NOTE   Patient Name: Brooke Cantrell MRN: [id number] DOB:1946-06-26, 78 y.o., female Today's Date: 07/17/2024  END OF SESSION:  PT End of Session - 07/17/24 0901     Visit Number 28    Number of Visits 38    Date for Recertification  08/01/24    Authorization Type MEDICARE    Progress Note Due on Visit 33    PT Start Time 0856    PT Stop Time 0934    PT Time Calculation (min) 38 min    Activity Tolerance Patient tolerated treatment well;No increased pain    Behavior During Therapy [facility] for tasks assessed/performed           Past Medical History:  Diagnosis Date   Aortic atherosclerosis 05/2024   Arthritis    Breast cancer (HCC)    Left breast   Depression    mild, no meds   History of kidney stones    passed the stone   HLD (hyperlipidemia)    Hypertension    Osteopenia 05/2024   Past Surgical History:  Procedure Laterality Date   breast cancer surgery     BREAST ENHANCEMENT SURGERY     BREAST LUMPECTOMY Left    CATARACT EXTRACTION, BILATERAL     ECTOPIC PREGNANCY SURGERY     EXAM UNDER ANESTHESIA WITH MANIPULATION OF KNEE Right 06/19/2024   Procedure: MANIPULATION, JOINT, KNEE, WITH ANESTHESIA;  Surgeon: Vernetta Lonni GRADE, MD;  Location: [facility] OR;  Service: Orthopedics;  Laterality: Right;  RIGHT KNEE MANIPULATION UNDER ANESTHESIA   EYE SURGERY     INNER EAR SURGERY     TONSILLECTOMY     TOTAL KNEE ARTHROPLASTY Right 04/22/2024   Procedure: ARTHROPLASTY, KNEE, TOTAL;  Surgeon: Vernetta Lonni GRADE, MD;  Location: [facility] OR;  Service: Orthopedics;  Laterality: Right;   TOTAL SHOULDER ARTHROPLASTY Left 04/25/2018   TOTAL SHOULDER ARTHROPLASTY Left 04/25/2018   Procedure: LEFT TOTAL SHOULDER REPLACEMENT;  Surgeon: Addie Cordella Hamilton, MD;  Location: [facility] OR;  Service: Orthopedics;  Laterality: Left;   TUBAL LIGATION     Patient Active Problem List   Diagnosis Date Noted   Left bundle branch block 06/23/2024   Dizziness  06/23/2024   Coronary artery calcification 06/23/2024   Arthrofibrosis of knee joint, right 06/18/2024   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   Weakness    Hyperlipidemia    Acute respiratory failure due to COVID-19 (HCC) 06/10/2020   Pneumonia due to COVID-19 virus 06/10/2020   Hx of multiple pulmonary nodules 06/10/2020   Acute respiratory failure with hypoxia (HCC)    Lung nodule seen on imaging study 01/08/2020   History of breast cancer 04/14/2019   Sepsis (HCC) 04/02/2019   CAP (community acquired pneumonia) 04/02/2019   Elevated troponin 04/02/2019   Shoulder arthritis 04/25/2018   Primary osteoarthritis, 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   Chronic left shoulder pain 05/17/2017   Cervical disc disorder with radiculopathy 08/14/2016   Chronic infection of sinus 08/31/2015   Cough 08/31/2015   Allergic reaction 08/31/2015    PCP: Alda Carpen, MD  REFERRING PROVIDER: Lonni GRADE Vernetta, MD  REFERRING DIAG: [phone] (ICD-10-CM) - Status post total right knee replacement  THERAPY DIAG:  Acute pain of right knee  Stiffness of right knee, not elsewhere classified  Localized edema  Muscle weakness (generalized)  Difficulty in walking, not elsewhere classified  Rationale for Evaluation and Treatment: Rehabilitation  ONSET DATE: 04/22/2024  SUBJECTIVE:   SUBJECTIVE STATEMENT: Cardiologist wants to rule  out PE as cause of increased HR.   PERTINENT HISTORY: Lt breast cancer, kidney stones, HTN, Lt TSA 2019  PAIN:  NPRS scale:  achy, no specific number provided.  Location: Rt knee Pain description: Stiff, achy, rare sharp Aggravating factors: Prolonged postures and too much WB Relieving factors: Pain meds, ice, change positions frequently  PRECAUTIONS: None  RED FLAGS: None   WEIGHT BEARING RESTRICTIONS: No  FALLS:  Has patient fallen in last 6 months? No  LIVING ENVIRONMENT: Lives with: lives with their family and grandson Lives in:  House/apartment Stairs: Uses the rail with steps Has following equipment at home: Single point cane and Environmental Consultant - 2 wheeled  OCCUPATION: Retired  PLOF: Independent  PATIENT GOALS: Return to gardening her acre lot, weed, plant in the garden  OBJECTIVE:  Note: Objective measures were completed at Evaluation unless otherwise noted.  DIAGNOSTIC FINDINGS: Right knee arthroplasty in expected alignment. No periprosthetic lucency or fracture. There has been patellar resurfacing. Recent postsurgical change includes air and edema in the soft tissues and joint space. Anterior skin staples in place.  PATIENT SURVEYS:  PSFS: THE PATIENT SPECIFIC FUNCTIONAL SCALE  Place score of 0-10 (0 = unable to perform activity and 10 = able to perform activity at the same level as before injury or problem)  Activity Date:  05/09/2024 06/02/2024 06/26/24  07/02/24  Walk 2 5 8 8   2.  Garden 0 0 4 6  3.  Safely transfer to and from the ground 0 0 5 5  4.       Total Score 0.67 1.67 5.67 6.33    Total Score = Sum of activity scores/number of activities  Minimally Detectable Change: 3 points (for single activity); 2 points (for average score)  [facility] (nd). The Patient Specific Functional Scale . Retrieved from [email or web address]   COGNITION: Overall cognitive status: Within functional limits for tasks assessed     SENSATION: WFL  EDEMA:  Noted and not objectively assessed  LOWER EXTREMITY ROM:   ROM Left/Right  05/09/2024 Rt 05/13/24 Right 05/20/24 Right 05/21/24 Right 05/26/24 Right 05/28/24 Rt 06/02/24 Right 06/04/24 Right  06/06/24 Rt 06/09/24 Right  06/13/24 Rt 06/19/24  Right 06/20/24 Right 06/25/24 Right 06/27/24 Right 07/02/24  Knee flexion 145/52 Supine:  A:  P:  Sitting:  P: 70 Supine AA: 76* Seated P: 78* Standing  P: flexion stretch 82* Seated P: 87* Supine  A: 76deg A: 89  P: 95 Active 93 AROM: 87 PROM:  91 Active 97 AROM: 109deg A: 115 113 117 Seated P: 117* A: 110*  Knee extension 0/7     Seated P: -12*  LAQ Supine:  A: 6deg of flexion A: -11 (seated LAQ) Active lacks 5 AROM: 10deg flexion PROM: 8deg flexion  Active 5 AROM: 3deg PROM: 2deg    5 Lacks 5 LAQ -4* Standing  A: -2*   (Blank rows = not tested)  LOWER EXTREMITY STRENGTH:  In pounds with hand-held dynamometer Left/Right 05/09/2024 Right 06/04/24 Right 06/30/2024  Knee flexion     Knee extension 49.7/5.9 pounds 27.8# 30.8, 30.5 lbs   (Blank rows = not tested)  GAIT: 07/07/2024: Independent ambulation reported at home and in public.   Eval: Distance walked: 50 feet Assistive device utilized: Walker - 2 wheeled Level of assistance: Modified independence Comments: Brooke Cantrell is using her walker full-time at this point                    TODAY'S TREATMENT: 07/17/2024  Therapeutic Exercise: precor Recumbent bike seat 4 with 10 reps flexion stretch, full revolutions backwards 3 min,  then forward full revolutions level 1 for 3 min.   PT explained what PE is while riding.   Prone RLE ext stretch with LLE crossed over at ankle 1 min 2 sets.  Between sets RLE active knee flexion with isometric ext 5 sec for 10 reps PT recommended using her smart watch on workout app to monitor her walks so she can see time, pace & distance.  Her goal is to return to walking 3 miles when she goes back to [facility].  This will enable us  to determine where she currently is. Pt demo to husband & pt.  Pt & husband verbalized understanding. Hamstring stretch RLE long sit with strap DF 30 sec 3 reps Seated LAQ & active knee flexion with contralateral LE opposing motion 10 reps  Therapeutic Activities:  Squat lift 15# kettle bell 10 reps on floor with PT cues on knee flex & ext Pt amb 50' carrying 15# kettle bell in RUE & 50' in LUE.  Leg press RLE only 50# 10 reps 2 sets with heavy focus on knee ext  TKE blue theraband  RLE 10 reps 2 sets first rep TKE only  then 2nd set TKE + SLS    TREATMENT       DATE:  07/15/24 TherEx SciFit bike L6 x 8 min; LEs only  Neuro Re-Ed Sidestepping on foam beam x 5 laps, intermittent UE support needed Tandem walking fwd/bwd on foam beam x 5 laps; UE support needed Forward step ups foam to 8 step 2x10 bil; intermittent UE support  TherAct Leg press RLE only 50# 3x10 Sit to/from stand with 15# KB 2x10 RDL 15# 2x10   07/09/2024 Therapeutic Exercise: SciFit Recumbent bike seat 8 pedal in notch #2 for longer lever arm / more knee flexion: 10 reps flexion stretch, backwards 1 min then forward full revolutions level 1 for 8 min.    Therapeutic Activities: Standing on airex RLE knee TKE blue theraband 10 reps 1st set TKE only, 2nd set TKE plus SLS lifting LLE up for 3-5 sec.   Squat lift 10# kettle bell 10 reps on floor, 5 reps standing uphill on ramp & 5 reps standing downhill on ramp.   Neuromuscular re-education: Stepping onto Airex and stepping contralateral LE from floor tapping knee to counter back to floor, alternating LEs 10 reps; initial 3 reps touching BLEs to sink, then progressed to single UE finger tip ipsilateral to stance LE last 7 reps  Side stepstepping BLEs onto mat to right & to left 10 reps with intermittent touch counter Side Stepping onto Airex and stepping contralateral LE from floor tapping knee to counter back to floor, 10 reps; single UE finger tip ipsilateral to stance LE both to right & to left. Tandem stance on foam RLE in front & in back 30 sec 2 reps each with intermittent touch;  then 30 sec each with eyes closed with tactile cues / minA.   Vaso Right knee with elevation & knee ext stretch 34* 10 min medium compression.   07/07/2024 Therex: Recumbent bike partial circles for ROM knee , seat 6 for several minutes prior to full revolutions to finish out 8 mins total  Incline gastroc stretch 30 sec x 3 bilateral   TherActivity: (to improve squatting, transfers, stairs) Leg  press double leg in available knee flexion range 87 lbs x 15 slow lowering focus Leg press single leg  37 lbs x 15 , performed bilaterally  Step on over and down WB on Rt leg 6 inch step x 10 with Lt hand assist on bar.   Neuro Re-ed Tandem stance on foam 1 min x 2 bilateral with occasional HHA on bars, SBA Tandem ambulation on foam fwd/back 6 f x 6 each way in // bars with occasional to moderate HHA, SBA  Vaso: Rt knee 34 deg medium compression in elevation on wedge 10 mins    HOME EXERCISE PROGRAM: Access Code: [email or web address] URL: [email or web address] Date: 06/16/2024 Prepared by: Brooke Cantrell  Exercises - Seated Knee Flexion AAROM  - 5 x daily - 7 x weekly - 1 sets - 10 reps - 10 seconds hold - Supine Quadricep Sets  - 10 x daily - 7 x weekly - 10 sets - 10 reps - 5 second hold - Seated Knee Flexion Extension AROM   - 2-4 x daily - 7 x weekly - 2-3 sets - 10 reps - 5 seconds hold - Seated Knee Flexion Extension AAROM with Overpressure  - 2-4 x daily - 7 x weekly - 2-3 sets - 10 reps - 5 seconds hold - Seated Long Arc Quad  - 2-4 x daily - 7 x weekly - 2-3 sets - 10 reps - 5 seconds hold - Supine Heel Slide with Strap  - 2-4 x daily - 7 x weekly - 2-3 sets - 10 reps - 5 seconds hold - Standing Knee Flexion Stretch on Step  - 2-3 x daily - 7 x weekly - 3-5 sets - 3 reps - 10 seconds hold - Gastroc Stretch on Step  - 1-3 x daily - 7 x weekly - 1 sets - 3 reps - 30 seconds hold - Standing Heel Raise  - 1-2 x daily - 7 x weekly - 2-3 sets - 10 reps - 2-3 seconds hold - Tandem Stance  - 1 x daily - 7 x weekly - 3 sets - 2 reps - 30 seconds hold - Seated Table Hamstring Stretch  - 1-3 x daily - 7 x weekly - 1 sets - 3 reps - 30 seconds hold - Seated Hamstring Stretch with Strap  - 1-2 x daily - 7 x weekly - 1 sets - 3 reps - 30 seconds hold - Seated Passive Knee Extension  - 2 x daily - 7 x weekly - 3-5 minutes hold - Prone Knee Extension with Ankle Weight  - 2 x daily - 7 x  weekly - 3-5 minute hold - Supine Quadriceps Stretch with Strap on Table  - 1-3 x daily - 7 x weekly - 1 sets - 3 reps - 30 seconds hold  ASSESSMENT:  CLINICAL IMPRESSION: Pt is slowly improving her knee range and functional strength.  Patient continues to benefit from ongoing PT to maximize outcomes from TKA.   OBJECTIVE IMPAIRMENTS: Abnormal gait, decreased activity tolerance, decreased endurance, decreased knowledge of condition, difficulty walking, decreased ROM, decreased strength, increased edema, and pain.   ACTIVITY LIMITATIONS: bending, sitting, standing, squatting, sleeping, stairs, transfers, bed mobility, dressing, and locomotion level  PARTICIPATION LIMITATIONS: community activity and yard work  PERSONAL FACTORS: Lt breast cancer, kidney stones, HTN, Lt TSA 2019 are also affecting patient's functional outcome.   REHAB POTENTIAL: Good  CLINICAL DECISION MAKING: Stable/uncomplicated  EVALUATION COMPLEXITY: Low   GOALS: Goals reviewed with patient? Yes  SHORT TERM GOALS: Target date: 06/20/2024  Rivky will be independent with her day 1 home exercise program Baseline: Started 05/09/2024 Goal  status:   Met 06/06/2024  2.  Improve right knee active range of motion to 3 - 0 -95 degrees Baseline: 7 - 0 - 52 degrees Goal status: Partially Met 06/27/2024  3.  Improve right quadriceps strength to at least 30 pounds Baseline: 5.9 pounds Goal status: MET   06/30/2024   LONG TERM GOALS: Target date: 08/01/2024  Improve patient specific functional score to at least 5 Baseline: 0.67 Goal status: Met 06/27/2024  2.  Sharma will report right knee pain consistently 3/10 or better on the numeric pain rating scale Baseline: Can be as high as 8/10 Goal status: Met 06/25/2024  3.  Improve right knee AROM to 0-2-110 degrees (updated, 06/19/2024) Baseline: 0-2-109 06/19/2024 Goal status: ongoing   07/17/2024  4.  Improve bilateral quadriceps strength to 60 pounds or  better Baseline: See objective Goal status: ongoing   07/17/2024  5.  Alexsa will be comfortable without the use of an assistive device within the house and with no more than a single-point cane outside the house at transfer into independent rehabilitation Baseline: 2 wheeled walker full-time Goal status:  Met 06/13/2024  6.  We will be independent with a long-term home maintenance exercise program at discharge Baseline: Started 05/09/2024 Goal status: ongoing 07/17/2024   PLAN:  PT FREQUENCY: 2-3x/wk for 4 weeks   PT DURATION: 6 weeks   PLANNED INTERVENTIONS: 97750- Physical Performance Testing, 97110-Therapeutic exercises, 97530- Therapeutic activity, 97112- Neuromuscular re-education, 97535- Self Care, [id number]- Manual therapy, [phone]- Gait training, [phone]- Electrical stimulation (unattended), [id number]- Vasopneumatic device, Patient/Family education, Balance training, Stair training, Joint mobilization, and Cryotherapy  PLAN FOR NEXT SESSION:  focus on achieving full knee ext.   practice stairs, continue with Compliant surface balance, WB strengthening. Vaso prn.    Brooke Cantrell, PT, DPT 07/17/2024, 9:52 AM

## 2024-07-21 ENCOUNTER — Ambulatory Visit: Admitting: Physical Therapy

## 2024-07-21 ENCOUNTER — Encounter: Payer: Self-pay | Admitting: Physical Therapy

## 2024-07-21 DIAGNOSIS — M6281 Muscle weakness (generalized): Secondary | ICD-10-CM

## 2024-07-21 DIAGNOSIS — M25661 Stiffness of right knee, not elsewhere classified: Secondary | ICD-10-CM | POA: Diagnosis not present

## 2024-07-21 DIAGNOSIS — R600 Localized edema: Secondary | ICD-10-CM

## 2024-07-21 DIAGNOSIS — R262 Difficulty in walking, not elsewhere classified: Secondary | ICD-10-CM

## 2024-07-21 DIAGNOSIS — M25561 Pain in right knee: Secondary | ICD-10-CM | POA: Diagnosis not present

## 2024-07-21 NOTE — Therapy (Signed)
 OUTPATIENT PHYSICAL THERAPY TREATMENT NOTE   Patient Name: Brooke Cantrell MRN: [id number] DOB:[birth year], 78 y.o., female Today's Date: 07/21/2024  END OF SESSION:  PT End of Session - 07/21/24 1100     Visit Number 29    Number of Visits 38    Date for Recertification  08/01/24    Authorization Type MEDICARE    Progress Note Due on Visit 33    PT Start Time 1100    PT Stop Time 1142    PT Time Calculation (min) 42 min    Activity Tolerance Patient tolerated treatment well;No increased pain    Behavior During Therapy [facility] for tasks assessed/performed            Past Medical History:  Diagnosis Date   Aortic atherosclerosis 05/2024   Arthritis    Breast cancer (HCC)    Left breast   Depression    mild, no meds   History of kidney stones    passed the stone   HLD (hyperlipidemia)    Hypertension    Osteopenia 05/2024   Past Surgical History:  Procedure Laterality Date   breast cancer surgery     BREAST ENHANCEMENT SURGERY     BREAST LUMPECTOMY Left    CATARACT EXTRACTION, BILATERAL     ECTOPIC PREGNANCY SURGERY     EXAM UNDER ANESTHESIA WITH MANIPULATION OF KNEE Right 06/19/2024   Procedure: MANIPULATION, JOINT, KNEE, WITH ANESTHESIA;  Surgeon: Vernetta Lonni GRADE, MD;  Location: [facility] OR;  Service: Orthopedics;  Laterality: Right;  RIGHT KNEE MANIPULATION UNDER ANESTHESIA   EYE SURGERY     INNER EAR SURGERY     TONSILLECTOMY     TOTAL KNEE ARTHROPLASTY Right 04/22/2024   Procedure: ARTHROPLASTY, KNEE, TOTAL;  Surgeon: Vernetta Lonni GRADE, MD;  Location: [facility] OR;  Service: Orthopedics;  Laterality: Right;   TOTAL SHOULDER ARTHROPLASTY Left 04/25/2018   TOTAL SHOULDER ARTHROPLASTY Left 04/25/2018   Procedure: LEFT TOTAL SHOULDER REPLACEMENT;  Surgeon: Addie Cordella Hamilton, MD;  Location: [facility] OR;  Service: Orthopedics;  Laterality: Left;   TUBAL LIGATION     Patient Active Problem List   Diagnosis Date Noted   Left bundle branch block 06/23/2024   Dizziness  06/23/2024   Coronary artery calcification 06/23/2024   Arthrofibrosis of knee joint, right 06/18/2024   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   Weakness    Hyperlipidemia    Acute respiratory failure due to COVID-19 (HCC) 06/10/2020   Pneumonia due to COVID-19 virus 06/10/2020   Hx of multiple pulmonary nodules 06/10/2020   Acute respiratory failure with hypoxia (HCC)    Lung nodule seen on imaging study 01/08/2020   History of breast cancer 04/14/2019   Sepsis (HCC) 04/02/2019   CAP (community acquired pneumonia) 04/02/2019   Elevated troponin 04/02/2019   Shoulder arthritis 04/25/2018   Primary osteoarthritis, 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   Chronic left shoulder pain 05/17/2017   Cervical disc disorder with radiculopathy 08/14/2016   Chronic infection of sinus 08/31/2015   Cough 08/31/2015   Allergic reaction 08/31/2015    PCP: Alda Carpen, MD  REFERRING PROVIDER: Lonni GRADE Vernetta, MD  REFERRING DIAG: [phone] (ICD-10-CM) - Status post total right knee replacement  THERAPY DIAG:  Acute pain of right knee  Stiffness of right knee, not elsewhere classified  Localized edema  Muscle weakness (generalized)  Difficulty in walking, not elsewhere classified  Rationale for Evaluation and Treatment: Rehabilitation  ONSET DATE: 04/22/2024  SUBJECTIVE:   SUBJECTIVE STATEMENT: The scan showed  no PE but some possible previous pneumonia.  She walked just shy of mile.     PERTINENT HISTORY: Lt breast cancer, kidney stones, HTN, Lt TSA 2019  PAIN:  NPRS scale:  achy, no specific number provided.  Location: Rt knee Pain description: Stiff, achy, rare sharp Aggravating factors: Prolonged postures and too much WB Relieving factors: Pain meds, ice, change positions frequently  PRECAUTIONS: None  RED FLAGS: None   WEIGHT BEARING RESTRICTIONS: No  FALLS:  Has patient fallen in last 6 months? No  LIVING ENVIRONMENT: Lives with: lives with their  family and grandson Lives in: House/apartment Stairs: Uses the rail with steps Has following equipment at home: Single point cane and Environmental Consultant - 2 wheeled  OCCUPATION: Retired  PLOF: Independent  PATIENT GOALS: Return to gardening her acre lot, weed, plant in the garden  OBJECTIVE:  Note: Objective measures were completed at Evaluation unless otherwise noted.  DIAGNOSTIC FINDINGS: Right knee arthroplasty in expected alignment. No periprosthetic lucency or fracture. There has been patellar resurfacing. Recent postsurgical change includes air and edema in the soft tissues and joint space. Anterior skin staples in place.  PATIENT SURVEYS:  PSFS: THE PATIENT SPECIFIC FUNCTIONAL SCALE  Place score of 0-10 (0 = unable to perform activity and 10 = able to perform activity at the same level as before injury or problem)  Activity Date:  05/09/2024 06/02/2024 06/26/24  07/02/24  Walk 2 5 8 8   2.  Garden 0 0 4 6  3.  Safely transfer to and from the ground 0 0 5 5  4.       Total Score 0.67 1.67 5.67 6.33    Total Score = Sum of activity scores/number of activities  Minimally Detectable Change: 3 points (for single activity); 2 points (for average score)  [name] (nd). The Patient Specific Functional Scale . Retrieved from [email or web address]   COGNITION: Overall cognitive status: Within functional limits for tasks assessed     SENSATION: WFL  EDEMA:  Noted and not objectively assessed  LOWER EXTREMITY ROM:   ROM Left/Right  05/09/2024 Rt 05/13/24 Right 05/20/24 Right 05/21/24 Right 05/26/24 Right 05/28/24 Rt 06/02/24 Right 06/04/24 Right  06/06/24 Rt 06/09/24 Right  06/13/24 Rt 06/19/24  Right 06/20/24 Right 06/25/24 Right 06/27/24 Right 07/02/24  Knee flexion 145/52 Supine:  A:  P:  Sitting:  P: 70 Supine AA: 76* Seated P: 78* Standing  P: flexion stretch 82* Seated P: 87* Supine  A: 76deg A:  89  P: 95 Active 93 AROM: 87 PROM: 91 Active 97 AROM: 109deg A: 115 113 117 Seated P: 117* A: 110*  Knee extension 0/7     Seated P: -12*  LAQ Supine:  A: 6deg of flexion A: -11 (seated LAQ) Active lacks 5 AROM: 10deg flexion PROM: 8deg flexion  Active 5 AROM: 3deg PROM: 2deg    5 Lacks 5 LAQ -4* Standing  A: -2*   (Blank rows = not tested)  LOWER EXTREMITY STRENGTH:  In pounds with hand-held dynamometer Left/Right 05/09/2024 Right 06/04/24 Right 06/30/2024  Knee flexion     Knee extension 49.7/5.9 pounds 27.8# 30.8, 30.5 lbs   (Blank rows = not tested)  GAIT: 07/07/2024: Independent ambulation reported at home and in public.   Eval: Distance walked: 50 feet Assistive device utilized: Walker - 2 wheeled Level of assistance: Modified independence Comments: Kaho is using her walker full-time at this point  TODAY'S TREATMENT: 07/21/2024 Therapeutic Exercise: precor Recumbent bike seat 4 with 5 reps flexion stretch, full revolutions backwards 30 sec,  then forward full revolutions level 1 for 8 min.  Prone RLE ext stretch with LLE crossed over at ankle 1 min 2 sets.  Between sets RLE active knee flexion with isometric ext 5 sec for 10 reps Knee ext stretch with RLE extended in chair with 5# hang 1 min 2 reps with knee flexion bw reps.   Hamstring stretch RLE long sit with strap DF 30 sec 3 reps  Therapeutic Activities:  Squat lift 15# kettle bell 10 reps on floor with PT cues on knee flex & ext  Leg press BLEs 112# 15 reps;  marching for stance knee stability 100# 10 reps 2 sets; RLE only 56# 10 reps 2 sets with heavy focus on knee ext  TKE blue theraband  RLE 10 reps 2 sets first rep TKE only then 2nd set TKE + SLS   TREATMENT: 07/17/2024 Therapeutic Exercise: precor Recumbent bike seat 4 with 10 reps flexion stretch, full revolutions backwards 3 min,  then forward full revolutions level 1 for 3 min.   PT explained what PE is while riding.   Prone RLE  ext stretch with LLE crossed over at ankle 1 min 2 sets.  Between sets RLE active knee flexion with isometric ext 5 sec for 10 reps PT recommended using her smart watch on workout app to monitor her walks so she can see time, pace & distance.  Her goal is to return to walking 3 miles when she goes back to [facility].  This will enable us  to determine where she currently is. Pt demo to husband & pt.  Pt & husband verbalized understanding. Hamstring stretch RLE long sit with strap DF 30 sec 3 reps Seated LAQ & active knee flexion with contralateral LE opposing motion 10 reps  Therapeutic Activities:  Squat lift 15# kettle bell 10 reps on floor with PT cues on knee flex & ext Pt amb 50' carrying 15# kettle bell in RUE & 50' in LUE.  Leg press RLE only 50# 10 reps 2 sets with heavy focus on knee ext  TKE blue theraband  RLE 10 reps 2 sets first rep TKE only then 2nd set TKE + SLS    TREATMENT       DATE:  07/15/24 TherEx SciFit bike L6 x 8 min; LEs only  Neuro Re-Ed Sidestepping on foam beam x 5 laps, intermittent UE support needed Tandem walking fwd/bwd on foam beam x 5 laps; UE support needed Forward step ups foam to 8 step 2x10 bil; intermittent UE support  TherAct Leg press RLE only 50# 3x10 Sit to/from stand with 15# KB 2x10 RDL 15# 2x10   07/09/2024 Therapeutic Exercise: SciFit Recumbent bike seat 8 pedal in notch #2 for longer lever arm / more knee flexion: 10 reps flexion stretch, backwards 1 min then forward full revolutions level 1 for 8 min.    Therapeutic Activities: Standing on airex RLE knee TKE blue theraband 10 reps 1st set TKE only, 2nd set TKE plus SLS lifting LLE up for 3-5 sec.   Squat lift 10# kettle bell 10 reps on floor, 5 reps standing uphill on ramp & 5 reps standing downhill on ramp.   Neuromuscular re-education: Stepping onto Airex and stepping contralateral LE from floor tapping knee to counter back to floor, alternating LEs 10 reps; initial 3 reps touching BLEs  to sink, then progressed to single UE  finger tip ipsilateral to stance LE last 7 reps  Side stepstepping BLEs onto mat to right & to left 10 reps with intermittent touch counter Side Stepping onto Airex and stepping contralateral LE from floor tapping knee to counter back to floor, 10 reps; single UE finger tip ipsilateral to stance LE both to right & to left. Tandem stance on foam RLE in front & in back 30 sec 2 reps each with intermittent touch;  then 30 sec each with eyes closed with tactile cues / minA.   Vaso Right knee with elevation & knee ext stretch 34* 10 min medium compression.     HOME EXERCISE PROGRAM: Access Code: [email or web address] URL: [email or web address] Date: 06/16/2024 Prepared by: Grayce Spatz  Exercises - Seated Knee Flexion AAROM  - 5 x daily - 7 x weekly - 1 sets - 10 reps - 10 seconds hold - Supine Quadricep Sets  - 10 x daily - 7 x weekly - 10 sets - 10 reps - 5 second hold - Seated Knee Flexion Extension AROM   - 2-4 x daily - 7 x weekly - 2-3 sets - 10 reps - 5 seconds hold - Seated Knee Flexion Extension AAROM with Overpressure  - 2-4 x daily - 7 x weekly - 2-3 sets - 10 reps - 5 seconds hold - Seated Long Arc Quad  - 2-4 x daily - 7 x weekly - 2-3 sets - 10 reps - 5 seconds hold - Supine Heel Slide with Strap  - 2-4 x daily - 7 x weekly - 2-3 sets - 10 reps - 5 seconds hold - Standing Knee Flexion Stretch on Step  - 2-3 x daily - 7 x weekly - 3-5 sets - 3 reps - 10 seconds hold - Gastroc Stretch on Step  - 1-3 x daily - 7 x weekly - 1 sets - 3 reps - 30 seconds hold - Standing Heel Raise  - 1-2 x daily - 7 x weekly - 2-3 sets - 10 reps - 2-3 seconds hold - Tandem Stance  - 1 x daily - 7 x weekly - 3 sets - 2 reps - 30 seconds hold - Seated Table Hamstring Stretch  - 1-3 x daily - 7 x weekly - 1 sets - 3 reps - 30 seconds hold - Seated Hamstring Stretch with Strap  - 1-2 x daily - 7 x weekly - 1 sets - 3 reps - 30 seconds hold - Seated Passive Knee  Extension  - 2 x daily - 7 x weekly - 3-5 minutes hold - Prone Knee Extension with Ankle Weight  - 2 x daily - 7 x weekly - 3-5 minute hold - Supine Quadriceps Stretch with Strap on Table  - 1-3 x daily - 7 x weekly - 1 sets - 3 reps - 30 seconds hold  ASSESSMENT:  CLINICAL IMPRESSION: Patient is reporting that sitting for 40 min or more that her leg stiffens up but she can perform stretches to loosen knee.  She does improve knee mobility during PT session.    Patient continues to benefit from ongoing PT to maximize outcomes from TKA.   OBJECTIVE IMPAIRMENTS: Abnormal gait, decreased activity tolerance, decreased endurance, decreased knowledge of condition, difficulty walking, decreased ROM, decreased strength, increased edema, and pain.   ACTIVITY LIMITATIONS: bending, sitting, standing, squatting, sleeping, stairs, transfers, bed mobility, dressing, and locomotion level  PARTICIPATION LIMITATIONS: community activity and yard work  PERSONAL FACTORS: Lt breast cancer, kidney stones, HTN, Lt TSA 2019 are  also affecting patient's functional outcome.   REHAB POTENTIAL: Good  CLINICAL DECISION MAKING: Stable/uncomplicated  EVALUATION COMPLEXITY: Low   GOALS: Goals reviewed with patient? Yes  SHORT TERM GOALS: Target date: 06/20/2024  Jaedan will be independent with her day 1 home exercise program Baseline: Started 05/09/2024 Goal status:   Met 06/06/2024  2.  Improve right knee active range of motion to 3 - 0 -95 degrees Baseline: 7 - 0 - 52 degrees Goal status: Partially Met 06/27/2024  3.  Improve right quadriceps strength to at least 30 pounds Baseline: 5.9 pounds Goal status: MET   06/30/2024   LONG TERM GOALS: Target date: 08/01/2024  Improve patient specific functional score to at least 5 Baseline: 0.67 Goal status: Met 06/27/2024  2.  Camilla will report right knee pain consistently 3/10 or better on the numeric pain rating scale Baseline: Can be as high as 8/10 Goal  status: Met 06/25/2024  3.  Improve right knee AROM to 0-2-110 degrees (updated, 06/19/2024) Baseline: 0-2-109 06/19/2024 Goal status: ongoing   07/21/2024  4.  Improve bilateral quadriceps strength to 60 pounds or better Baseline: See objective Goal status: ongoing   07/21/2024  5.  Nohea will be comfortable without the use of an assistive device within the house and with no more than a single-point cane outside the house at transfer into independent rehabilitation Baseline: 2 wheeled walker full-time Goal status:  Met 06/13/2024  6.  We will be independent with a long-term home maintenance exercise program at discharge Baseline: Started 05/09/2024 Goal status: ongoing 07/21/2024   PLAN:  PT FREQUENCY: 2-3x/wk for 4 weeks   PT DURATION: 6 weeks   PLANNED INTERVENTIONS: 97750- Physical Performance Testing, 97110-Therapeutic exercises, 97530- Therapeutic activity, 97112- Neuromuscular re-education, 97535- Self Care, [id number]- Manual therapy, [phone]- Gait training, [phone]- Electrical stimulation (unattended), [id number]- Vasopneumatic device, Patient/Family education, Balance training, Stair training, Joint mobilization, and Cryotherapy  PLAN FOR NEXT SESSION:   focus on achieving full knee ext.   practice stairs, continue with Compliant surface balance, WB strengthening. Vaso prn.    Grayce Spatz, PT, DPT 07/21/2024, 11:48 AM

## 2024-07-23 ENCOUNTER — Encounter: Payer: Self-pay | Admitting: Physical Therapy

## 2024-07-23 ENCOUNTER — Ambulatory Visit (INDEPENDENT_AMBULATORY_CARE_PROVIDER_SITE_OTHER): Admitting: Physical Therapy

## 2024-07-23 DIAGNOSIS — R262 Difficulty in walking, not elsewhere classified: Secondary | ICD-10-CM

## 2024-07-23 DIAGNOSIS — M25661 Stiffness of right knee, not elsewhere classified: Secondary | ICD-10-CM

## 2024-07-23 DIAGNOSIS — R600 Localized edema: Secondary | ICD-10-CM

## 2024-07-23 DIAGNOSIS — M25561 Pain in right knee: Secondary | ICD-10-CM

## 2024-07-23 DIAGNOSIS — M6281 Muscle weakness (generalized): Secondary | ICD-10-CM | POA: Diagnosis not present

## 2024-07-23 NOTE — Therapy (Signed)
 OUTPATIENT PHYSICAL THERAPY TREATMENT NOTE   Patient Name: Brooke Cantrell MRN: [id number] DOB:25-Feb-1946, 78 y.o., female Today's Date: 07/23/2024  END OF SESSION:  PT End of Session - 07/23/24 1100     Visit Number 30    Number of Visits 38    Date for Recertification  08/01/24    Authorization Type MEDICARE    Progress Note Due on Visit 33    PT Start Time 1100    PT Stop Time 1140    PT Time Calculation (min) 40 min    Activity Tolerance Patient tolerated treatment well;No increased pain    Behavior During Therapy [facility] for tasks assessed/performed             Past Medical History:  Diagnosis Date   Aortic atherosclerosis 05/2024   Arthritis    Breast cancer (HCC)    Left breast   Depression    mild, no meds   History of kidney stones    passed the stone   HLD (hyperlipidemia)    Hypertension    Osteopenia 05/2024   Past Surgical History:  Procedure Laterality Date   breast cancer surgery     BREAST ENHANCEMENT SURGERY     BREAST LUMPECTOMY Left    CATARACT EXTRACTION, BILATERAL     ECTOPIC PREGNANCY SURGERY     EXAM UNDER ANESTHESIA WITH MANIPULATION OF KNEE Right 06/19/2024   Procedure: MANIPULATION, JOINT, KNEE, WITH ANESTHESIA;  Surgeon: Vernetta Lonni GRADE, MD;  Location: [facility] OR;  Service: Orthopedics;  Laterality: Right;  RIGHT KNEE MANIPULATION UNDER ANESTHESIA   EYE SURGERY     INNER EAR SURGERY     TONSILLECTOMY     TOTAL KNEE ARTHROPLASTY Right 04/22/2024   Procedure: ARTHROPLASTY, KNEE, TOTAL;  Surgeon: Vernetta Lonni GRADE, MD;  Location: [facility] OR;  Service: Orthopedics;  Laterality: Right;   TOTAL SHOULDER ARTHROPLASTY Left 04/25/2018   TOTAL SHOULDER ARTHROPLASTY Left 04/25/2018   Procedure: LEFT TOTAL SHOULDER REPLACEMENT;  Surgeon: Addie Cordella Hamilton, MD;  [facility];  Service: Orthopedics;  Laterality: Left;   TUBAL LIGATION     Patient Active Problem List   Diagnosis Date Noted   Left bundle branch block 06/23/2024   Dizziness  06/23/2024   Coronary artery calcification 06/23/2024   Arthrofibrosis of knee joint, right 06/18/2024   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   Weakness    Hyperlipidemia    Acute respiratory failure due to COVID-19 (HCC) 06/10/2020   Pneumonia due to COVID-19 virus 06/10/2020   Hx of multiple pulmonary nodules 06/10/2020   Acute respiratory failure with hypoxia (HCC)    Lung nodule seen on imaging study 01/08/2020   History of breast cancer 04/14/2019   Sepsis (HCC) 04/02/2019   CAP (community acquired pneumonia) 04/02/2019   Elevated troponin 04/02/2019   Shoulder arthritis 04/25/2018   Primary osteoarthritis, 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   Chronic left shoulder pain 05/17/2017   Cervical disc disorder with radiculopathy 08/14/2016   Chronic infection of sinus 08/31/2015   Cough 08/31/2015   Allergic reaction 08/31/2015    PCP: Alda Carpen, MD  REFERRING PROVIDER: Lonni GRADE Vernetta, MD  REFERRING DIAG: [id number] (ICD-10-CM) - Status post total right knee replacement  THERAPY DIAG:  Acute pain of right knee  Stiffness of right knee, not elsewhere classified  Localized edema  Muscle weakness (generalized)  Difficulty in walking, not elsewhere classified  Rationale for Evaluation and Treatment: Rehabilitation  ONSET DATE: 04/22/2024  SUBJECTIVE:   SUBJECTIVE STATEMENT: Her knee  was sore after last session for most of rest of that day but was fine the next morning.     PERTINENT HISTORY: Lt breast cancer, kidney stones, HTN, Lt TSA 2019  PAIN:  NPRS scale:  achy, no specific number provided.  Location: Rt knee Pain description: Stiff, achy, rare sharp Aggravating factors: Prolonged postures and too much WB Relieving factors: Pain meds, ice, change positions frequently  PRECAUTIONS: None  RED FLAGS: None   WEIGHT BEARING RESTRICTIONS: No  FALLS:  Has patient fallen in last 6 months? No  LIVING ENVIRONMENT: Lives with: lives with  their family and grandson Lives in: House/apartment Stairs: Uses the rail with steps Has following equipment at home: Single point cane and Environmental Consultant - 2 wheeled  OCCUPATION: Retired  PLOF: Independent  PATIENT GOALS: Return to gardening her acre lot, weed, plant in the garden  OBJECTIVE:  Note: Objective measures were completed at Evaluation unless otherwise noted.  DIAGNOSTIC FINDINGS: Right knee arthroplasty in expected alignment. No periprosthetic lucency or fracture. There has been patellar resurfacing. Recent postsurgical change includes air and edema in the soft tissues and joint space. Anterior skin staples in place.  PATIENT SURVEYS:  PSFS: THE PATIENT SPECIFIC FUNCTIONAL SCALE  Place score of 0-10 (0 = unable to perform activity and 10 = able to perform activity at the same level as before injury or problem)  Activity Date:  05/09/2024 06/02/2024 06/26/24  07/02/24  Walk 2 5 8 8   2.  Garden 0 0 4 6  3.  Safely transfer to and from the ground 0 0 5 5  4.       Total Score 0.67 1.67 5.67 6.33    Total Score = Sum of activity scores/number of activities  Minimally Detectable Change: 3 points (for single activity); 2 points (for average score)  [name] (nd). The Patient Specific Functional Scale . Retrieved from [email or web address]   COGNITION: Overall cognitive status: Within functional limits for tasks assessed     SENSATION: WFL  EDEMA:  Noted and not objectively assessed  LOWER EXTREMITY ROM:   ROM Left/Right  05/09/2024 Rt 05/13/24 Right 05/20/24 Right 05/21/24 Right 05/26/24 Right 05/28/24 Rt 06/02/24 Right 06/04/24 Right  06/06/24 Rt 06/09/24 Right  06/13/24 Rt 06/19/24  Right 06/20/24 Right 06/25/24 Right 06/27/24 Right 07/02/24 Right 07/23/24  Knee flexion 145/52 Supine:  A:  P:  Sitting:  P: 70 Supine AA: 76* Seated P: 78* Standing  P: flexion stretch 82* Seated P: 87* Supine   A: 76deg A: 89  P: 95 Active 93 AROM: 87 PROM: 91 Active 97 AROM: 109deg A: 115 113 117 Seated P: 117* A: 110*   Knee extension 0/7     Seated P: -12*  LAQ Supine:  A: 6deg of flexion A: -11 (seated LAQ) Active lacks 5 AROM: 10deg flexion PROM: 8deg flexion  Active 5 AROM: 3deg PROM: 2deg    5 Lacks 5 LAQ -4* Standing  A: -2* Standing A: -2*   (Blank rows = not tested)  LOWER EXTREMITY STRENGTH:  In pounds with hand-held dynamometer Left/Right 05/09/2024 Right 06/04/24 Right 06/30/2024  Knee flexion     Knee extension 49.7/5.9 pounds 27.8# 30.8, 30.5 lbs   (Blank rows = not tested)  GAIT: 07/07/2024: Independent ambulation reported at home and in public.   Eval: Distance walked: 50 feet Assistive device utilized: Walker - 2 wheeled Level of assistance: Modified independence Comments: Maurisha is using her walker full-time at this point  TODAY'S TREATMENT: 07/23/2024 Therapeutic Exercise: precor Recumbent bike seat 4 full revolutions backwards 10 reps,  then forward full revolutions level 3 for 8 min.  Knee ext machine 10# BLE concentric and RLE isometric / eccentric 15 reps Prone RLE ext stretch with LLE crossed over at ankle 1 min 2 sets.  Between sets RLE active knee flexion with isometric ext 5 sec for 10 reps  Therapeutic Activities:  Stairs alternating pattern 11 steps flight descending 1st flight 2 rails, 2nd flight left rail, 3rd flight right rail;  ascending single rail with ea side. Working on single step down with RLE with PT cues on technique - 10 reps left rail, 10 reps right rail, 5 reps no rail.   Mountain climber in plank with counter top support 10 reps with focus on stance knee ext.  TKE blue theraband  RLE 10 reps 2 sets first rep TKE only then 2nd set TKE + SLS Ascending & descending curb using RLE with supervision & verbal cues on technique.       TREATMENT: 07/21/2024 Therapeutic Exercise: precor Recumbent bike seat 4 with 5  reps flexion stretch, full revolutions backwards 30 sec,  then forward full revolutions level 1 for 8 min.  Prone RLE ext stretch with LLE crossed over at ankle 1 min 2 sets.  Between sets RLE active knee flexion with isometric ext 5 sec for 10 reps Knee ext stretch with RLE extended in chair with 5# hang 1 min 2 reps with knee flexion bw reps.   Hamstring stretch RLE long sit with strap DF 30 sec 3 reps  Therapeutic Activities:  Squat lift 15# kettle bell 10 reps on floor with PT cues on knee flex & ext  Leg press BLEs 112# 15 reps;  marching for stance knee stability 100# 10 reps 2 sets; RLE only 56# 10 reps 2 sets with heavy focus on knee ext  TKE blue theraband  RLE 10 reps 2 sets first rep TKE only then 2nd set TKE + SLS   TREATMENT: 07/17/2024 Therapeutic Exercise: precor Recumbent bike seat 4 with 10 reps flexion stretch, full revolutions backwards 3 min,  then forward full revolutions level 1 for 3 min.   PT explained what PE is while riding.   Prone RLE ext stretch with LLE crossed over at ankle 1 min 2 sets.  Between sets RLE active knee flexion with isometric ext 5 sec for 10 reps PT recommended using her smart watch on workout app to monitor her walks so she can see time, pace & distance.  Her goal is to return to walking 3 miles when she goes back to [facility].  This will enable us  to determine where she currently is. Pt demo to husband & pt.  Pt & husband verbalized understanding. Hamstring stretch RLE long sit with strap DF 30 sec 3 reps Seated LAQ & active knee flexion with contralateral LE opposing motion 10 reps  Therapeutic Activities:  Squat lift 15# kettle bell 10 reps on floor with PT cues on knee flex & ext Pt amb 50' carrying 15# kettle bell in RUE & 50' in LUE.  Leg press RLE only 50# 10 reps 2 sets with heavy focus on knee ext  TKE blue theraband  RLE 10 reps 2 sets first rep TKE only then 2nd set TKE + SLS    TREATMENT       DATE:  07/15/24 TherEx SciFit bike L6 x 8  min; LEs only  Neuro Re-Ed Sidestepping on  foam beam x 5 laps, intermittent UE support needed Tandem walking fwd/bwd on foam beam x 5 laps; UE support needed Forward step ups foam to 8 step 2x10 bil; intermittent UE support  TherAct Leg press RLE only 50# 3x10 Sit to/from stand with 15# KB 2x10 RDL 15# 2x10     HOME EXERCISE PROGRAM: Access Code: [email or web address] URL: [email or web address] Date: 06/16/2024 Prepared by: Grayce Spatz  Exercises - Seated Knee Flexion AAROM  - 5 x daily - 7 x weekly - 1 sets - 10 reps - 10 seconds hold - Supine Quadricep Sets  - 10 x daily - 7 x weekly - 10 sets - 10 reps - 5 second hold - Seated Knee Flexion Extension AROM   - 2-4 x daily - 7 x weekly - 2-3 sets - 10 reps - 5 seconds hold - Seated Knee Flexion Extension AAROM with Overpressure  - 2-4 x daily - 7 x weekly - 2-3 sets - 10 reps - 5 seconds hold - Seated Long Arc Quad  - 2-4 x daily - 7 x weekly - 2-3 sets - 10 reps - 5 seconds hold - Supine Heel Slide with Strap  - 2-4 x daily - 7 x weekly - 2-3 sets - 10 reps - 5 seconds hold - Standing Knee Flexion Stretch on Step  - 2-3 x daily - 7 x weekly - 3-5 sets - 3 reps - 10 seconds hold - Gastroc Stretch on Step  - 1-3 x daily - 7 x weekly - 1 sets - 3 reps - 30 seconds hold - Standing Heel Raise  - 1-2 x daily - 7 x weekly - 2-3 sets - 10 reps - 2-3 seconds hold - Tandem Stance  - 1 x daily - 7 x weekly - 3 sets - 2 reps - 30 seconds hold - Seated Table Hamstring Stretch  - 1-3 x daily - 7 x weekly - 1 sets - 3 reps - 30 seconds hold - Seated Hamstring Stretch with Strap  - 1-2 x daily - 7 x weekly - 1 sets - 3 reps - 30 seconds hold - Seated Passive Knee Extension  - 2 x daily - 7 x weekly - 3-5 minutes hold - Prone Knee Extension with Ankle Weight  - 2 x daily - 7 x weekly - 3-5 minute hold - Supine Quadriceps Stretch with Strap on Table  - 1-3 x daily - 7 x weekly - 1 sets - 3 reps - 30 seconds hold  ASSESSMENT:  CLINICAL  IMPRESSION: Patient improved knee control on stairs and curbs with PT instruction and repetition.   Patient continues to benefit from ongoing PT to maximize outcomes from TKA.   OBJECTIVE IMPAIRMENTS: Abnormal gait, decreased activity tolerance, decreased endurance, decreased knowledge of condition, difficulty walking, decreased ROM, decreased strength, increased edema, and pain.   ACTIVITY LIMITATIONS: bending, sitting, standing, squatting, sleeping, stairs, transfers, bed mobility, dressing, and locomotion level  PARTICIPATION LIMITATIONS: community activity and yard work  PERSONAL FACTORS: Lt breast cancer, kidney stones, HTN, Lt TSA 2019 are also affecting patient's functional outcome.   REHAB POTENTIAL: Good  CLINICAL DECISION MAKING: Stable/uncomplicated  EVALUATION COMPLEXITY: Low   GOALS: Goals reviewed with patient? Yes  SHORT TERM GOALS: Target date: 06/20/2024  Apple will be independent with her day 1 home exercise program Baseline: Started 05/09/2024 Goal status:   Met 06/06/2024  2.  Improve right knee active range of motion to 3 - 0 -95 degrees Baseline: 7 - 0 -  52 degrees Goal status: Partially Met 06/27/2024  3.  Improve right quadriceps strength to at least 30 pounds Baseline: 5.9 pounds Goal status: MET   06/30/2024   LONG TERM GOALS: Target date: 08/01/2024  Improve patient specific functional score to at least 5 Baseline: 0.67 Goal status: Met 06/27/2024  2.  Deshauna will report right knee pain consistently 3/10 or better on the numeric pain rating scale Baseline: Can be as high as 8/10 Goal status: Met 06/25/2024  3.  Improve right knee AROM to 0-2-110 degrees (updated, 06/19/2024) Baseline: 0-2-109 06/19/2024 Goal status: ongoing   07/21/2024  4.  Improve bilateral quadriceps strength to 60 pounds or better Baseline: See objective Goal status: ongoing   07/21/2024  5.  Arron will be comfortable without the use of an assistive device within the house  and with no more than a single-point cane outside the house at transfer into independent rehabilitation Baseline: 2 wheeled walker full-time Goal status:  Met 06/13/2024  6.  We will be independent with a long-term home maintenance exercise program at discharge Baseline: Started 05/09/2024 Goal status: ongoing 07/21/2024   PLAN:  PT FREQUENCY: 2-3x/wk for 4 weeks   PT DURATION: 6 weeks   PLANNED INTERVENTIONS: 97750- Physical Performance Testing, 97110-Therapeutic exercises, 97530- Therapeutic activity, 97112- Neuromuscular re-education, 97535- Self Care, [id number]- Manual therapy, [phone]- Gait training, [phone]- Electrical stimulation (unattended), [id number]- Vasopneumatic device, Patient/Family education, Balance training, Stair training, Joint mobilization, and Cryotherapy  PLAN FOR NEXT SESSION:  continue to focus on achieving full knee ext.   practice stairs, continue with Compliant surface balance, WB strengthening. Vaso prn.    Grayce Spatz, PT, DPT 07/23/2024, 2:59 PM

## 2024-07-25 NOTE — Progress Notes (Signed)
 Cardiology Office Note:    Date:  07/31/2024   ID:  Brooke Cantrell, DOB Feb 04, 1946, MRN [id number]  PCP:  Alla Amis, MD   [facility] Providers Cardiologist:  Shenee Wignall, MD     Referring MD: Alla Amis, MD   Chief complaint: Follow-up emergency department visit     History of Present Illness:   Brooke Cantrell is a 78 y.o. female with a hx of hypertension, aortic atherosclerosis, osteoporosis, presents for follow up chest pain.   She underwent a total right knee arthroplasty on 04/22/2024.  Reports she could not do physical therapy the first 1.5 weeks due to scheduling issues with PT/home aide.  Was more immobile during that time.  Reports 1 episode of nausea and lightheadedness following physical therapy about a month after initial surgery, was transient, passed.  She had a second knee surgery on 06/19/2024 due to arthrofibrosis of postsurgical knee joint that was treated with manipulation of that knee under anesthesia.  On  06/23/2024 she presented to the ED with complaints of nausea and dizziness, was unable to do exercises at physical therapy.  Also complained of nausea with vomiting the previous Friday.  EKG showed sinus rhythm, 86 bpm, LBBB with PVCs. HS troponins negative X2, BMP and CBC unremarkable, CXR showed no active disease, respiratory panel was negative, cardiology was consulted for LBBB in the setting of nausea, dizziness with no prior cardiac history.  It was noted the  LBBB aberrancy was intermittent and was on EKG in 2020.    Coronary CTA on 07/09/2024: Minimal-mild nonobstructive CAD, CADRADS = 2.  Coronary artery calcium  score is 240, 71st percentile for age/race/sex matched controls.  Risk factor modification including lipid-lowering target of LDL <70 recommended.  CT overread showed a 6 mm nodule density over the lingula likely part of the adjacent atelectasis, although may be discrete new nodule. Non-contrast chest CT at 6-12 months is  recommended.   Echo done yesterday showed mild LV dysfunction EF 45-50% due to dyssynergy from LBBB.  ROS:   Please see the history of present illness.    All other systems reviewed and are negative.     Past Medical History:  Diagnosis Date   Aortic atherosclerosis 05/2024   Arthritis    Breast cancer (HCC)    Left breast   Depression    mild, no meds   History of kidney stones    passed the stone   HLD (hyperlipidemia)    Hypertension    Osteopenia 05/2024    Past Surgical History:  Procedure Laterality Date   breast cancer surgery     BREAST ENHANCEMENT SURGERY     BREAST LUMPECTOMY Left    CATARACT EXTRACTION, BILATERAL     ECTOPIC PREGNANCY SURGERY     EXAM UNDER ANESTHESIA WITH MANIPULATION OF KNEE Right 06/19/2024   Procedure: MANIPULATION, JOINT, KNEE, WITH ANESTHESIA;  Surgeon: Vernetta Lonni GRADE, MD;  Location: [facility] OR;  Service: Orthopedics;  Laterality: Right;  RIGHT KNEE MANIPULATION UNDER ANESTHESIA   EYE SURGERY     INNER EAR SURGERY     TONSILLECTOMY     TOTAL KNEE ARTHROPLASTY Right 04/22/2024   Procedure: ARTHROPLASTY, KNEE, TOTAL;  Surgeon: Vernetta Lonni GRADE, MD;  Location: [facility] OR;  Service: Orthopedics;  Laterality: Right;   TOTAL SHOULDER ARTHROPLASTY Left 04/25/2018   TOTAL SHOULDER ARTHROPLASTY Left 04/25/2018   Procedure: LEFT TOTAL SHOULDER REPLACEMENT;  Surgeon: Addie Cordella Hamilton, MD;  [facility];  Service: Orthopedics;  Laterality: Left;  TUBAL LIGATION      Current Medications: Current Meds  Medication Sig   aspirin  81 MG chewable tablet Chew 1 tablet (81 mg total) by mouth 2 (two) times daily. (Patient taking differently: Chew 81 mg by mouth once.)   calcium  citrate-vitamin D  (CITRACAL+D) 315-200 MG-UNIT tablet Take 2 tablets by mouth 2 (two) times daily with a meal.   chlorthalidone  (HYGROTON ) 25 MG tablet Take 25 mg by mouth daily.   cholecalciferol  (VITAMIN D3) 25 MCG (1000 UNIT) tablet Take 1,000 Units by mouth in the  morning and at bedtime.   D-Mannose (MANNXTRA PO) Take 1 capsule by mouth daily. (Patient taking differently: Take 2 capsules by mouth daily.)   ezetimibe  (ZETIA ) 10 MG tablet Take 10 mg by mouth daily.   Multiple Vitamin (MULTIVITAMIN) capsule Take 1 capsule by mouth daily.   Omega-3 Fatty Acids (OMEGA 3 PO) Take by mouth 2 (two) times daily.   [DISCONTINUED] metoprolol succinate (TOPROL XL) 25 MG 24 hr tablet Take 1 tablet (25 mg total) by mouth daily.     Allergies:   Ciprofloxacin and Oxycodone    Social History   Socioeconomic History   Marital status: Married    Spouse name: Not on file   Number of children: Not on file   Years of education: Not on file   Highest education level: Not on file  Occupational History   Not on file  Tobacco Use   Smoking status: Former    Current packs/day: 0.00    Types: Cigarettes    Quit date: 09/11/1996    Years since quitting: 27.9   Smokeless tobacco: Never  Vaping Use   Vaping status: Never Used  Substance and Sexual Activity   Alcohol use: Yes    Alcohol/week: 5.0 - 7.0 standard drinks of alcohol    Types: 5 - 7 Standard drinks or equivalent per week    Comment: 5-7 per week, when going out eat   Drug use: Never   Sexual activity: Not Currently    Birth control/protection: Post-menopausal  Other Topics Concern   Not on file  Social History Narrative   Not on file   Social Drivers of Health   Financial Resource Strain: Low Risk  (08/01/2023)   Received from [facility]   Overall Financial Resource Strain (CARDIA)    Difficulty of Paying Living Expenses: Not hard at all  Food Insecurity: No Food Insecurity (08/01/2023)   Received from [facility]   Hunger Vital Sign    Within the past 12 months, you worried that your food would run out before you got the money to buy more.: Never true    Within the past 12 months, the food you bought just didn't last and you didn't have money to get more.:  Never true  Transportation Needs: No Transportation Needs (08/01/2023)   Received from [facility] - Transportation    In the past 12 months, has lack of transportation kept you from medical appointments or from getting medications?: No    Lack of Transportation (Non-Medical): No  Physical Activity: Not on file  Stress: Not on file  Social Connections: Not on file     Family History: The patient's family history includes Allergic rhinitis in her brother, father, and sister. There is no history of Breast cancer.  EKGs/Labs/Other Studies Reviewed:    The following studies were reviewed today:      Echo 07/30/24: IMPRESSIONS  1. LV function mildly redued, mainly secondary to septal abnormality from  known LBBB. Left ventricular ejection fraction, by estimation, is 45 to  50%. The left ventricle has mildly decreased function. The left ventricle  demonstrates regional wall motion   abnormalities (see scoring diagram/findings for description). Left  ventricular diastolic parameters are consistent with Grade I diastolic  dysfunction (impaired relaxation). The average left ventricular global  longitudinal strain is -20.8 %. The global  longitudinal strain is normal.   2. Right ventricular systolic function is normal. The right ventricular  size is normal.   3. The mitral valve is normal in structure. Trivial mitral valve  regurgitation. No evidence of mitral stenosis.   4. The aortic valve is tricuspid. Aortic valve regurgitation is not  visualized. No aortic stenosis is present.   5. The inferior vena cava is normal in size with greater than 50%  respiratory variability, suggesting right atrial pressure of 3 mmHg.   Coronary CTA 07/09/24: IMPRESSION: 1. No acute cardiopulmonary disease. 2. 6 mm nodular density over the lingula likely part of the adjacent atelectasis, although may be discrete new nodule. Non-contrast chest CT at 6-12 months is  recommended. If the nodule is stable at time of repeat CT, then future CT at 18-24 months (from today's scan) is considered optional for low-risk patients, but is recommended for high-risk patients. This recommendation follows the consensus statement: Guidelines for Management of Incidental Pulmonary Nodules Detected on CT Images: From the Fleischner Society 2017; Radiology 2017; 284:228-243. 3. 7 mm nodule over the right lower lobe unchanged since September 2023 and therefore considered benign. 4. Aortic atherosclerosis.   Aortic Atherosclerosis (ICD10-I70.0).   IMPRESSION: 1. Minimal to mild non-obstructive CAD, CADRADS = 2.   2. Normal coronary origin with right dominance.   3. Coronary artery calcium  score is 240, which places the patient in the 71st percentile for age/race and sex-matched controls (MESA).   4. Aortic atherosclerosis.   5. Secondary risk factor modification including lipid-lowering to target LDL <70 is recommended.  Recent Labs: 04/18/2024: ALT 18 06/23/2024: BUN 17; Creatinine, Ser 0.86; Hemoglobin 14.3; Platelets 274; Potassium 3.5; Sodium 135 07/16/2024: TSH 2.310  Recent Lipid Panel No results found for: CHOL, TRIG, HDL, CHOLHDL, VLDL, LDLCALC, LDLDIRECT    Physical Exam:    VS:  BP 126/82 (BP Location: Right Arm, Patient Position: Sitting, Cuff Size: Normal)   Pulse 64   Ht 5' 4 (1.626 m)   Wt 147 lb 9.6 oz (67 kg)   SpO2 99%   BMI 25.34 kg/m        Wt Readings from Last 3 Encounters:  07/31/24 147 lb 9.6 oz (67 kg)  07/16/24 145 lb 9.6 oz (66 kg)  06/23/24 140 lb (63.5 kg)     GEN:  Well nourished, well developed in no acute distress HEENT: Normal NECK:  No carotid bruits CARDIAC:  S1-S2 normal, RRR, no murmurs, rubs, gallops RESPIRATORY:  Clear to auscultation without rales, wheezing or rhonchi  MUSCULOSKELETAL:  Mild swelling present to right knee/thigh, trivial amount in right lower leg; No deformity, palpable DP  pulses bilaterally SKIN: Warm and dry NEUROLOGIC:  Alert and oriented x 3 PSYCHIATRIC:  Normal affect       Assessment & Plan Left bundle branch block (LBBB) CTA shows nonobstructive CAD. Focus on risk factor modification. Mild LV dysfunction on Echo due to dyssynchrony. Asymptomatic.  Coronary artery disease involving native coronary artery of native heart, unspecified whether angina present  Lung nodule seen on  imaging study Will repeat noncontrast CT in 6 months. History of pulmonary nodules in past with stable CT in 2023.  Primary hypertension  Hyperlipidemia, unspecified hyperlipidemia type  Last LDL 73 on Zetia . ? History of myalgias on statin. Will follow. If LDL increases consider low dose statin.        Medication Adjustments/Labs and Tests Ordered: Current medicines are reviewed at length with the patient today.  Concerns regarding medicines are outlined above.  No orders of the defined types were placed in this encounter.  Meds ordered this encounter  Medications   metoprolol succinate (TOPROL XL) 25 MG 24 hr tablet    Sig: Take 1 tablet (25 mg total) by mouth daily.    Dispense:  90 tablet    Refill:  3    There are no Patient Instructions on file for this visit.   Signed, Jasier Calabretta, MD  07/31/2024 10:56 AM    [facility]

## 2024-07-28 ENCOUNTER — Ambulatory Visit (INDEPENDENT_AMBULATORY_CARE_PROVIDER_SITE_OTHER): Admitting: Physical Therapy

## 2024-07-28 ENCOUNTER — Encounter: Admitting: Physical Therapy

## 2024-07-28 ENCOUNTER — Encounter: Payer: Self-pay | Admitting: Physical Therapy

## 2024-07-28 DIAGNOSIS — M25661 Stiffness of right knee, not elsewhere classified: Secondary | ICD-10-CM | POA: Diagnosis not present

## 2024-07-28 DIAGNOSIS — M6281 Muscle weakness (generalized): Secondary | ICD-10-CM | POA: Diagnosis not present

## 2024-07-28 DIAGNOSIS — R600 Localized edema: Secondary | ICD-10-CM | POA: Diagnosis not present

## 2024-07-28 DIAGNOSIS — R262 Difficulty in walking, not elsewhere classified: Secondary | ICD-10-CM

## 2024-07-28 DIAGNOSIS — M25561 Pain in right knee: Secondary | ICD-10-CM

## 2024-07-28 NOTE — Therapy (Signed)
 OUTPATIENT PHYSICAL THERAPY TREATMENT NOTE   Patient Name: Brooke Cantrell MRN: [id number] DOB:02/18/1946, 78 y.o., female Today's Date: 07/28/2024  END OF SESSION:  PT End of Session - 07/28/24 0845     Visit Number 31    Number of Visits 38    Date for Recertification  08/01/24    Authorization Type MEDICARE    Progress Note Due on Visit 33    PT Start Time 0845    PT Stop Time 0928    PT Time Calculation (min) 43 min    Activity Tolerance Patient tolerated treatment well;No increased pain    Behavior During Therapy [facility] for tasks assessed/performed              Past Medical History:  Diagnosis Date   Aortic atherosclerosis 05/2024   Arthritis    Breast cancer (HCC)    Left breast   Depression    mild, no meds   History of kidney stones    passed the stone   HLD (hyperlipidemia)    Hypertension    Osteopenia 05/2024   Past Surgical History:  Procedure Laterality Date   breast cancer surgery     BREAST ENHANCEMENT SURGERY     BREAST LUMPECTOMY Left    CATARACT EXTRACTION, BILATERAL     ECTOPIC PREGNANCY SURGERY     EXAM UNDER ANESTHESIA WITH MANIPULATION OF KNEE Right 06/19/2024   Procedure: MANIPULATION, JOINT, KNEE, WITH ANESTHESIA;  Surgeon: Vernetta Lonni GRADE, MD;  Location: [facility] OR;  Service: Orthopedics;  Laterality: Right;  RIGHT KNEE MANIPULATION UNDER ANESTHESIA   EYE SURGERY     INNER EAR SURGERY     TONSILLECTOMY     TOTAL KNEE ARTHROPLASTY Right 04/22/2024   Procedure: ARTHROPLASTY, KNEE, TOTAL;  Surgeon: Vernetta Lonni GRADE, MD;  Location: [facility] OR;  Service: Orthopedics;  Laterality: Right;   TOTAL SHOULDER ARTHROPLASTY Left 04/25/2018   TOTAL SHOULDER ARTHROPLASTY Left 04/25/2018   Procedure: LEFT TOTAL SHOULDER REPLACEMENT;  Surgeon: Addie Cordella Hamilton, MD;  Location: [facility] OR;  Service: Orthopedics;  Laterality: Left;   TUBAL LIGATION     Patient Active Problem List   Diagnosis Date Noted   Left bundle branch block 06/23/2024    Dizziness 06/23/2024   Coronary artery calcification 06/23/2024   Arthrofibrosis of knee joint, right 06/18/2024   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   Weakness    Hyperlipidemia    Acute respiratory failure due to COVID-19 (HCC) 06/10/2020   Pneumonia due to COVID-19 virus 06/10/2020   Hx of multiple pulmonary nodules 06/10/2020   Acute respiratory failure with hypoxia (HCC)    Lung nodule seen on imaging study 01/08/2020   History of breast cancer 04/14/2019   Sepsis (HCC) 04/02/2019   CAP (community acquired pneumonia) 04/02/2019   Elevated troponin 04/02/2019   Shoulder arthritis 04/25/2018   Primary osteoarthritis, 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   Chronic left shoulder pain 05/17/2017   Cervical disc disorder with radiculopathy 08/14/2016   Chronic infection of sinus 08/31/2015   Cough 08/31/2015   Allergic reaction 08/31/2015    PCP: Alda Carpen, MD  REFERRING PROVIDER: Lonni GRADE Vernetta, MD  REFERRING DIAG: [phone] (ICD-10-CM) - Status post total right knee replacement  THERAPY DIAG:  Acute pain of right knee  Stiffness of right knee, not elsewhere classified  Localized edema  Muscle weakness (generalized)  Difficulty in walking, not elsewhere classified  Rationale for Evaluation and Treatment: Rehabilitation  ONSET DATE: 04/22/2024  SUBJECTIVE:   SUBJECTIVE STATEMENT: She  is walking daily for ~1 mile taking ~25 min +/-.  She stops due to fatigue.    PERTINENT HISTORY: Lt breast cancer, kidney stones, HTN, Lt TSA 2019  PAIN:  NPRS scale:  achy, no specific number provided.  Location: Rt knee Pain description: Stiff, achy, rare sharp Aggravating factors: Prolonged postures and too much WB Relieving factors: Pain meds, ice, change positions frequently  PRECAUTIONS: None  RED FLAGS: None   WEIGHT BEARING RESTRICTIONS: No  FALLS:  Has patient fallen in last 6 months? No  LIVING ENVIRONMENT: Lives with: lives with their  family and grandson Lives in: House/apartment Stairs: Uses the rail with steps Has following equipment at home: Single point cane and Environmental Consultant - 2 wheeled  OCCUPATION: Retired  PLOF: Independent  PATIENT GOALS: Return to gardening her acre lot, weed, plant in the garden  OBJECTIVE:  Note: Objective measures were completed at Evaluation unless otherwise noted.  DIAGNOSTIC FINDINGS: Right knee arthroplasty in expected alignment. No periprosthetic lucency or fracture. There has been patellar resurfacing. Recent postsurgical change includes air and edema in the soft tissues and joint space. Anterior skin staples in place.  PATIENT SURVEYS:  PSFS: THE PATIENT SPECIFIC FUNCTIONAL SCALE  Place score of 0-10 (0 = unable to perform activity and 10 = able to perform activity at the same level as before injury or problem)  Activity Date:  05/09/2024 06/02/2024 06/26/24  07/02/24  Walk 2 5 8 8   2.  Garden 0 0 4 6  3.  Safely transfer to and from the ground 0 0 5 5  4.       Total Score 0.67 1.67 5.67 6.33    Total Score = Sum of activity scores/number of activities  Minimally Detectable Change: 3 points (for single activity); 2 points (for average score)  [name] (nd). The Patient Specific Functional Scale . Retrieved from [email or web address]   COGNITION: Overall cognitive status: Within functional limits for tasks assessed     SENSATION: WFL  EDEMA:  Noted and not objectively assessed  LOWER EXTREMITY ROM:   ROM Left/Right  05/09/2024 Rt 05/13/24 Right 05/20/24 Right 05/21/24 Right 05/26/24 Right 05/28/24 Rt 06/02/24 Right 06/04/24 Right  06/06/24 Rt 06/09/24 Right  06/13/24 Rt 06/19/24  Right 06/20/24 Right 06/25/24 Right 06/27/24 Right 07/02/24 Right 07/23/24  Knee flexion 145/52 Supine:  A:  P:  Sitting:  P: 70 Supine AA: 76* Seated P: 78* Standing  P: flexion stretch 82* Seated P: 87* Supine  A:  76deg A: 89  P: 95 Active 93 AROM: 87 PROM: 91 Active 97 AROM: 109deg A: 115 113 117 Seated P: 117* A: 110*   Knee extension 0/7     Seated P: -12*  LAQ Supine:  A: 6deg of flexion A: -11 (seated LAQ) Active lacks 5 AROM: 10deg flexion PROM: 8deg flexion  Active 5 AROM: 3deg PROM: 2deg    5 Lacks 5 LAQ -4* Standing  A: -2* Standing A: -2*   (Blank rows = not tested)  LOWER EXTREMITY STRENGTH:  In pounds with hand-held dynamometer Left/Right 05/09/2024 Right 06/04/24 Right 06/30/2024  Knee flexion     Knee extension 49.7/5.9 pounds 27.8# 30.8, 30.5 lbs   (Blank rows = not tested)  GAIT: 07/07/2024: Independent ambulation reported at home and in public.   Eval: Distance walked: 50 feet Assistive device utilized: Walker - 2 wheeled Level of assistance: Modified independence Comments: Rubye is using her walker full-time at this point  TODAY'S TREATMENT: 07/28/2024 Therapeutic Exercise: precor Recumbent bike seat 4 forward full revolutions level 4 for 8 min.  PT reviewed Knee ext stretch with RLE extended in chair with 5# hang 1 min 2 reps with knee flexion bw reps.   Standing on incline board in straddle position gastroc stretch 30 sec and heel raises BLEs 10 reps 2 sets  Therapeutic Activities:  Step up, over & down on BOSU round side up with 2 step approach so momentum minimizing UE assist (BUEs on //bars) 10 reps with each LE  Stepping over 6 hurdles (6) with focus on knee flex to clear and knee ext in stance.  1 rep with HHA, then 5 more reps with intermittent UE assist.   Mountain climber in plank with counter top support 10 reps with focus on stance knee ext.  TKE blue theraband  RLE 10 reps 2 sets TKE + SLS  Increasing gait velocity simulating crossing street with cues on longer steps.  Pt performed 6 reps.  PT recommended trying to increase speed for 15-20 steps 3x during her walks. Pt verbalized understanding.    Manual Therapy Right knee ext  Grade IV mob with belt standing on incline board     TREATMENT: 07/23/2024 Therapeutic Exercise: precor Recumbent bike seat 4 full revolutions backwards 10 reps,  then forward full revolutions level 3 for 8 min.  Knee ext machine 10# BLE concentric and RLE isometric / eccentric 15 reps Prone RLE ext stretch with LLE crossed over at ankle 1 min 2 sets.  Between sets RLE active knee flexion with isometric ext 5 sec for 10 reps  Therapeutic Activities:  Stairs alternating pattern 11 steps flight descending 1st flight 2 rails, 2nd flight left rail, 3rd flight right rail;  ascending single rail with ea side. Working on single step down with RLE with PT cues on technique - 10 reps left rail, 10 reps right rail, 5 reps no rail.   Mountain climber in plank with counter top support 10 reps with focus on stance knee ext.  TKE blue theraband  RLE 10 reps 2 sets first rep TKE only then 2nd set TKE + SLS Ascending & descending curb using RLE with supervision & verbal cues on technique.      t TREATMENT: 07/21/2024 Therapeutic Exercise: precor Recumbent bike seat 4 with 5 reps flexion stretch, full revolutions backwards 30 sec,  then forward full revolutions level 1 for 8 min.  Prone RLE ext stretch with LLE crossed over at ankle 1 min 2 sets.  Between sets RLE active knee flexion with isometric ext 5 sec for 10 reps Knee ext stretch with RLE extended in chair with 5# hang 1 min 2 reps with knee flexion bw reps.   Hamstring stretch RLE long sit with strap DF 30 sec 3 reps  Therapeutic Activities:  Squat lift 15# kettle bell 10 reps on floor with PT cues on knee flex & ext  Leg press BLEs 112# 15 reps;  marching for stance knee stability 100# 10 reps 2 sets; RLE only 56# 10 reps 2 sets with heavy focus on knee ext  TKE blue theraband  RLE 10 reps 2 sets first rep TKE only then 2nd set TKE + SLS   TREATMENT: 07/17/2024 Therapeutic Exercise: precor Recumbent bike seat 4 with 10 reps flexion  stretch, full revolutions backwards 3 min,  then forward full revolutions level 1 for 3 min.   PT explained what PE is while riding.   Prone RLE  ext stretch with LLE crossed over at ankle 1 min 2 sets.  Between sets RLE active knee flexion with isometric ext 5 sec for 10 reps PT recommended using her smart watch on workout app to monitor her walks so she can see time, pace & distance.  Her goal is to return to walking 3 miles when she goes back to [facility].  This will enable us  to determine where she currently is. Pt demo to husband & pt.  Pt & husband verbalized understanding. Hamstring stretch RLE long sit with strap DF 30 sec 3 reps Seated LAQ & active knee flexion with contralateral LE opposing motion 10 reps  Therapeutic Activities:  Squat lift 15# kettle bell 10 reps on floor with PT cues on knee flex & ext Pt amb 50' carrying 15# kettle bell in RUE & 50' in LUE.  Leg press RLE only 50# 10 reps 2 sets with heavy focus on knee ext  TKE blue theraband  RLE 10 reps 2 sets first rep TKE only then 2nd set TKE + SLS    HOME EXERCISE PROGRAM: Access Code: [email or web address] URL: [email or web address] Date: 06/16/2024 Prepared by: Grayce Spatz  Exercises - Seated Knee Flexion AAROM  - 5 x daily - 7 x weekly - 1 sets - 10 reps - 10 seconds hold - Supine Quadricep Sets  - 10 x daily - 7 x weekly - 10 sets - 10 reps - 5 second hold - Seated Knee Flexion Extension AROM   - 2-4 x daily - 7 x weekly - 2-3 sets - 10 reps - 5 seconds hold - Seated Knee Flexion Extension AAROM with Overpressure  - 2-4 x daily - 7 x weekly - 2-3 sets - 10 reps - 5 seconds hold - Seated Long Arc Quad  - 2-4 x daily - 7 x weekly - 2-3 sets - 10 reps - 5 seconds hold - Supine Heel Slide with Strap  - 2-4 x daily - 7 x weekly - 2-3 sets - 10 reps - 5 seconds hold - Standing Knee Flexion Stretch on Step  - 2-3 x daily - 7 x weekly - 3-5 sets - 3 reps - 10 seconds hold - Gastroc Stretch on Step  - 1-3 x daily - 7 x weekly -  1 sets - 3 reps - 30 seconds hold - Standing Heel Raise  - 1-2 x daily - 7 x weekly - 2-3 sets - 10 reps - 2-3 seconds hold - Tandem Stance  - 1 x daily - 7 x weekly - 3 sets - 2 reps - 30 seconds hold - Seated Table Hamstring Stretch  - 1-3 x daily - 7 x weekly - 1 sets - 3 reps - 30 seconds hold - Seated Hamstring Stretch with Strap  - 1-2 x daily - 7 x weekly - 1 sets - 3 reps - 30 seconds hold - Seated Passive Knee Extension  - 2 x daily - 7 x weekly - 3-5 minutes hold - Prone Knee Extension with Ankle Weight  - 2 x daily - 7 x weekly - 3-5 minute hold - Supine Quadriceps Stretch with Strap on Table  - 1-3 x daily - 7 x weekly - 1 sets - 3 reps - 30 seconds hold  ASSESSMENT:  CLINICAL IMPRESSION: Patient improved knee control with progressive Therapeutic Activities.    OBJECTIVE IMPAIRMENTS: Abnormal gait, decreased activity tolerance, decreased endurance, decreased knowledge of condition, difficulty walking, decreased ROM, decreased strength, increased edema, and pain.  ACTIVITY LIMITATIONS: bending, sitting, standing, squatting, sleeping, stairs, transfers, bed mobility, dressing, and locomotion level  PARTICIPATION LIMITATIONS: community activity and yard work  PERSONAL FACTORS: Lt breast cancer, kidney stones, HTN, Lt TSA 2019 are also affecting patient's functional outcome.   REHAB POTENTIAL: Good  CLINICAL DECISION MAKING: Stable/uncomplicated  EVALUATION COMPLEXITY: Low   GOALS: Goals reviewed with patient? Yes  SHORT TERM GOALS: Target date: 06/20/2024  Senaida will be independent with her day 1 home exercise program Baseline: Started 05/09/2024 Goal status:   Met 06/06/2024  2.  Improve right knee active range of motion to 3 - 0 -95 degrees Baseline: 7 - 0 - 52 degrees Goal status: Partially Met 06/27/2024  3.  Improve right quadriceps strength to at least 30 pounds Baseline: 5.9 pounds Goal status: MET   06/30/2024   LONG TERM GOALS: Target date:  08/01/2024  Improve patient specific functional score to at least 5 Baseline: 0.67 Goal status: Met 06/27/2024  2.  Shaniqua will report right knee pain consistently 3/10 or better on the numeric pain rating scale Baseline: Can be as high as 8/10 Goal status: Met 06/25/2024  3.  Improve right knee AROM to 0-2-110 degrees (updated, 06/19/2024) Baseline: 0-2-109 06/19/2024 Goal status: ongoing   07/28/2024  4.  Improve bilateral quadriceps strength to 60 pounds or better Baseline: See objective Goal status: ongoing   07/28/2024  5.  Carmela will be comfortable without the use of an assistive device within the house and with no more than a single-point cane outside the house at transfer into independent rehabilitation Baseline: 2 wheeled walker full-time Goal status:  Met 06/13/2024  6.  We will be independent with a long-term home maintenance exercise program at discharge Baseline: Started 05/09/2024 Goal status: ongoing 07/28/2024   PLAN:  PT FREQUENCY: 2-3x/wk for 4 weeks   PT DURATION: 6 weeks   PLANNED INTERVENTIONS: 97750- Physical Performance Testing, 97110-Therapeutic exercises, 97530- Therapeutic activity, 97112- Neuromuscular re-education, 97535- Self Care, [id number]- Manual therapy, [phone]- Gait training, [phone]- Electrical stimulation (unattended), [id number]- Vasopneumatic device, Patient/Family education, Balance training, Stair training, Joint mobilization, and Cryotherapy  PLAN FOR NEXT SESSION:  check LTGs.      Grayce Spatz, PT, DPT 07/28/2024, 12:57 PM

## 2024-07-29 ENCOUNTER — Ambulatory Visit: Admitting: Nurse Practitioner

## 2024-07-30 ENCOUNTER — Encounter: Payer: Self-pay | Admitting: Physical Therapy

## 2024-07-30 ENCOUNTER — Ambulatory Visit (HOSPITAL_COMMUNITY)
Admission: RE | Admit: 2024-07-30 | Discharge: 2024-07-30 | Disposition: A | Discharge status: Home / Self Care | Source: Ambulatory Visit | Attending: Cardiology | Admitting: Cardiology

## 2024-07-30 ENCOUNTER — Ambulatory Visit: Admitting: Physical Therapy

## 2024-07-30 DIAGNOSIS — R262 Difficulty in walking, not elsewhere classified: Secondary | ICD-10-CM

## 2024-07-30 DIAGNOSIS — M25561 Pain in right knee: Secondary | ICD-10-CM

## 2024-07-30 DIAGNOSIS — I447 Left bundle-branch block, unspecified: Secondary | ICD-10-CM | POA: Diagnosis present

## 2024-07-30 DIAGNOSIS — M25661 Stiffness of right knee, not elsewhere classified: Secondary | ICD-10-CM

## 2024-07-30 DIAGNOSIS — R600 Localized edema: Secondary | ICD-10-CM | POA: Diagnosis not present

## 2024-07-30 DIAGNOSIS — M6281 Muscle weakness (generalized): Secondary | ICD-10-CM

## 2024-07-30 LAB — ECHOCARDIOGRAM COMPLETE
Area-P 1/2: 2.89 cm2
S' Lateral: 3.1 cm

## 2024-07-30 NOTE — Therapy (Signed)
 OUTPATIENT PHYSICAL THERAPY TREATMENT NOTE   Patient Name: Brooke Cantrell MRN: [id number] DOB:03/05/1946, 78 y.o., female Today's Date: 07/30/2024  END OF SESSION:  PT End of Session - 07/30/24 1102     Visit Number 32    Number of Visits 38    Date for Recertification  08/01/24    Authorization Type MEDICARE    Progress Note Due on Visit 33    PT Start Time 1100    PT Stop Time 1144    PT Time Calculation (min) 44 min    Activity Tolerance Patient tolerated treatment well;No increased pain    Behavior During Therapy [facility] for tasks assessed/performed               Past Medical History:  Diagnosis Date   Aortic atherosclerosis 05/2024   Arthritis    Breast cancer (HCC)    Left breast   Depression    mild, no meds   History of kidney stones    passed the stone   HLD (hyperlipidemia)    Hypertension    Osteopenia 05/2024   Past Surgical History:  Procedure Laterality Date   breast cancer surgery     BREAST ENHANCEMENT SURGERY     BREAST LUMPECTOMY Left    CATARACT EXTRACTION, BILATERAL     ECTOPIC PREGNANCY SURGERY     EXAM UNDER ANESTHESIA WITH MANIPULATION OF KNEE Right 06/19/2024   Procedure: MANIPULATION, JOINT, KNEE, WITH ANESTHESIA;  Surgeon: Vernetta Lonni GRADE, MD;  Location: [facility] OR;  Service: Orthopedics;  Laterality: Right;  RIGHT KNEE MANIPULATION UNDER ANESTHESIA   EYE SURGERY     INNER EAR SURGERY     TONSILLECTOMY     TOTAL KNEE ARTHROPLASTY Right 04/22/2024   Procedure: ARTHROPLASTY, KNEE, TOTAL;  Surgeon: Vernetta Lonni GRADE, MD;  Location: [facility] OR;  Service: Orthopedics;  Laterality: Right;   TOTAL SHOULDER ARTHROPLASTY Left 04/25/2018   TOTAL SHOULDER ARTHROPLASTY Left 04/25/2018   Procedure: LEFT TOTAL SHOULDER REPLACEMENT;  Surgeon: Addie Cordella Hamilton, MD;  Location: [facility] OR;  Service: Orthopedics;  Laterality: Left;   TUBAL LIGATION     Patient Active Problem List   Diagnosis Date Noted   Left bundle branch block 06/23/2024    Dizziness 06/23/2024   Coronary artery calcification 06/23/2024   Arthrofibrosis of knee joint, right 06/18/2024   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   Weakness    Hyperlipidemia    Acute respiratory failure due to COVID-19 (HCC) 06/10/2020   Pneumonia due to COVID-19 virus 06/10/2020   Hx of multiple pulmonary nodules 06/10/2020   Acute respiratory failure with hypoxia (HCC)    Lung nodule seen on imaging study 01/08/2020   History of breast cancer 04/14/2019   Sepsis (HCC) 04/02/2019   CAP (community acquired pneumonia) 04/02/2019   Elevated troponin 04/02/2019   Shoulder arthritis 04/25/2018   Primary osteoarthritis, 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   Chronic left shoulder pain 05/17/2017   Cervical disc disorder with radiculopathy 08/14/2016   Chronic infection of sinus 08/31/2015   Cough 08/31/2015   Allergic reaction 08/31/2015    PCP: Alda Carpen, MD  REFERRING PROVIDER: Lonni GRADE Vernetta, MD  REFERRING DIAG: [id number] (ICD-10-CM) - Status post total right knee replacement  THERAPY DIAG:  Acute pain of right knee  Stiffness of right knee, not elsewhere classified  Localized edema  Muscle weakness (generalized)  Difficulty in walking, not elsewhere classified  Rationale for Evaluation and Treatment: Rehabilitation  ONSET DATE: 04/22/2024  SUBJECTIVE:   SUBJECTIVE STATEMENT:  She had echocardiogram this morning but does not know the results.     PERTINENT HISTORY: Lt breast cancer, kidney stones, HTN, Lt TSA 2019  PAIN:  NPRS scale:  achy, no specific number provided.  Location: Rt knee Pain description: Stiff, achy, rare sharp Aggravating factors: Prolonged postures and too much WB Relieving factors: Pain meds, ice, change positions frequently  PRECAUTIONS: None  RED FLAGS: None   WEIGHT BEARING RESTRICTIONS: No  FALLS:  Has patient fallen in last 6 months? No  LIVING ENVIRONMENT: Lives with: lives with their family and  grandson Lives in: House/apartment Stairs: Uses the rail with steps Has following equipment at home: Single point cane and Environmental Consultant - 2 wheeled  OCCUPATION: Retired  PLOF: Independent  PATIENT GOALS: Return to gardening her acre lot, weed, plant in the garden  OBJECTIVE:  Note: Objective measures were completed at Evaluation unless otherwise noted.  DIAGNOSTIC FINDINGS: Right knee arthroplasty in expected alignment. No periprosthetic lucency or fracture. There has been patellar resurfacing. Recent postsurgical change includes air and edema in the soft tissues and joint space. Anterior skin staples in place.  PATIENT SURVEYS:  PSFS: THE PATIENT SPECIFIC FUNCTIONAL SCALE  Place score of 0-10 (0 = unable to perform activity and 10 = able to perform activity at the same level as before injury or problem)  Activity Date:  05/09/2024 06/02/2024 06/26/24  07/02/24 07/30/24  Walk 2 5 8 8 9   2.  Garden 0 0 4 6 7   3.  Safely transfer to and from the ground 0 0 5 5 7   4.        Total Score 0.67 1.67 5.67 6.33 7.67    Total Score = Sum of activity scores/number of activities  Minimally Detectable Change: 3 points (for single activity); 2 points (for average score)  [name] (nd). The Patient Specific Functional Scale . Retrieved from [email or web address]   COGNITION: Overall cognitive status: Within functional limits for tasks assessed     SENSATION: WFL  EDEMA:  Noted and not objectively assessed  LOWER EXTREMITY ROM:   ROM Left/Right  05/09/2024 Rt 05/13/24 Right 05/20/24 Right 05/21/24 Right 05/26/24 Right 05/28/24 Rt 06/02/24 Right 06/04/24 Right  06/06/24 Rt 06/09/24 Right  06/13/24 Rt 06/19/24  Right 06/20/24 Right  06/25/24 Right 06/27/24 Right 07/02/24 Right 07/23/24 RIght 07/30/24  Knee flexion 145/52 Supine:  A:  P:  Sitting:  P: 70 Supine AA: 76* Seated P: 78* Standing  P: flexion stretch 82*  Seated P: 87* Supine  A: 76deg A: 89  P: 95 Active 93 AROM: 87 PROM: 91 Active 97 AROM: 109deg A: 115 113 117 Seated P: 117* A: 110*  Seated A: 115* P: 119*  Knee extension 0/7     Seated P: -12*  LAQ Supine:  A: 6deg of flexion A: -11 (seated LAQ) Active lacks 5 AROM: 10deg flexion PROM: 8deg flexion  Active 5 AROM: 3deg PROM: 2deg    5 Lacks 5 LAQ -4* Standing  A: -2* Standing A: -2* Standing A: -2 & LAQ -2*   (Blank rows = not tested)  LOWER EXTREMITY STRENGTH:  In pounds with hand-held dynamometer Left/Right 05/09/2024 Right 06/04/24 Right 06/30/2024 Right 07/30/24  Knee flexion      Knee extension 49.7/5.9 pounds 27.8# 30.8, 30.5 lbs 40.8#, 42.4#   (Blank rows = not tested)  GAIT: 07/07/2024: Independent ambulation reported at home and in public.   Eval: Distance walked: 50 feet Assistive device utilized: Environmental Consultant - 2 wheeled  Level of assistance: Modified independence Comments: Desire is using her walker full-time at this point                    TODAY'S TREATMENT: 07/30/2024 Therapeutic Exercise: precor Recumbent bike seat 4 forward full revolutions level 4 for 8 min.  PT reviewed Knee ext stretch with RLE extended in chair with 5# hang 1 min 2 reps with knee flexion bw reps.   Standing gastroc stretch on step heel depression  & hamstring stretch foot on step 30 sec 3 reps each. Prone RLE ext stretch with LLE crossed over at ankle 1 min 2 sets.  Between sets RLE active knee flexion with isometric ext 5 sec for 10 reps  Therapeutic Activities:  Mountain climber in plank with counter top support 10 reps with focus on stance knee ext.  TKE blue theraband  RLE 10 reps 2 sets TKE + SLS  PT educated how to perform as HEP which pt & husband verbalized understanding.   Self-care PT & pt discussed improving sleep: if taking NSAID she needs some food on stomach.  She tried   CBD gummies that helped but made her feel bad next day.  PT suggested trying CBD rub on stick  before bed.    See objective data   TREATMENT: 07/28/2024 Therapeutic Exercise: precor Recumbent bike seat 4 forward full revolutions level 4 for 8 min.  PT reviewed Knee ext stretch with RLE extended in chair with 5# hang 1 min 2 reps with knee flexion bw reps.   Standing on incline board in straddle position gastroc stretch 30 sec and heel raises BLEs 10 reps 2 sets  Therapeutic Activities:  Step up, over & down on BOSU round side up with 2 step approach so momentum minimizing UE assist (BUEs on //bars) 10 reps with each LE  Stepping over 6 hurdles (6) with focus on knee flex to clear and knee ext in stance.  1 rep with HHA, then 5 more reps with intermittent UE assist.   Mountain climber in plank with counter top support 10 reps with focus on stance knee ext.  TKE blue theraband  RLE 10 reps 2 sets TKE + SLS  Increasing gait velocity simulating crossing street with cues on longer steps.  Pt performed 6 reps.  PT recommended trying to increase speed for 15-20 steps 3x during her walks. Pt verbalized understanding.    Manual Therapy Right knee ext Grade IV mob with belt standing on incline board    TREATMENT: 07/23/2024 Therapeutic Exercise: precor Recumbent bike seat 4 full revolutions backwards 10 reps,  then forward full revolutions level 3 for 8 min.  Knee ext machine 10# BLE concentric and RLE isometric / eccentric 15 reps Prone RLE ext stretch with LLE crossed over at ankle 1 min 2 sets.  Between sets RLE active knee flexion with isometric ext 5 sec for 10 reps  Therapeutic Activities:  Stairs alternating pattern 11 steps flight descending 1st flight 2 rails, 2nd flight left rail, 3rd flight right rail;  ascending single rail with ea side. Working on single step down with RLE with PT cues on technique - 10 reps left rail, 10 reps right rail, 5 reps no rail.   Mountain climber in plank with counter top support 10 reps with focus on stance knee ext.  TKE blue theraband  RLE  10 reps 2 sets first rep TKE only then 2nd set TKE + SLS Ascending & descending curb using RLE  with supervision & verbal cues on technique.      t TREATMENT: 07/21/2024 Therapeutic Exercise: precor Recumbent bike seat 4 with 5 reps flexion stretch, full revolutions backwards 30 sec,  then forward full revolutions level 1 for 8 min.  Prone RLE ext stretch with LLE crossed over at ankle 1 min 2 sets.  Between sets RLE active knee flexion with isometric ext 5 sec for 10 reps Knee ext stretch with RLE extended in chair with 5# hang 1 min 2 reps with knee flexion bw reps.   Hamstring stretch RLE long sit with strap DF 30 sec 3 reps  Therapeutic Activities:  Squat lift 15# kettle bell 10 reps on floor with PT cues on knee flex & ext  Leg press BLEs 112# 15 reps;  marching for stance knee stability 100# 10 reps 2 sets; RLE only 56# 10 reps 2 sets with heavy focus on knee ext  TKE blue theraband  RLE 10 reps 2 sets first rep TKE only then 2nd set TKE + SLS     HOME EXERCISE PROGRAM: Access Code: [email or web address] URL: [email or web address] Date: 07/30/2024 Prepared by: Grayce Spatz  Exercises - Seated Knee Flexion AAROM  - 5 x daily - 7 x weekly - 1 sets - 10 reps - 10 seconds hold - Supine Quadricep Sets  - 10 x daily - 7 x weekly - 10 sets - 10 reps - 5 second hold - Seated Knee Flexion Extension AROM   - 2-4 x daily - 7 x weekly - 2-3 sets - 10 reps - 5 seconds hold - Seated Knee Flexion Extension AAROM with Overpressure  - 2-4 x daily - 7 x weekly - 2-3 sets - 10 reps - 5 seconds hold - Seated Long Arc Quad  - 2-4 x daily - 7 x weekly - 2-3 sets - 10 reps - 5 seconds hold - Supine Heel Slide with Strap  - 2-4 x daily - 7 x weekly - 2-3 sets - 10 reps - 5 seconds hold - Standing Knee Flexion Stretch on Step  - 2-3 x daily - 7 x weekly - 3-5 sets - 3 reps - 10 seconds hold - Gastroc Stretch on Step  - 1-3 x daily - 7 x weekly - 1 sets - 3 reps - 30 seconds hold - Standing Heel  Raise  - 1-2 x daily - 7 x weekly - 2-3 sets - 10 reps - 2-3 seconds hold - Tandem Stance  - 1 x daily - 7 x weekly - 3 sets - 2 reps - 30 seconds hold - Seated Table Hamstring Stretch  - 1-3 x daily - 7 x weekly - 1 sets - 3 reps - 30 seconds hold - Seated Hamstring Stretch with Strap  - 1-2 x daily - 7 x weekly - 1 sets - 3 reps - 30 seconds hold - Seated Passive Knee Extension  - 2 x daily - 7 x weekly - 3-5 minutes hold - Supine Quadriceps Stretch with Strap on Table  - 1-3 x daily - 7 x weekly - 1 sets - 3 reps - 30 seconds hold - Prone Knee Extension with Ankle Weight  - 1 x daily - 7 x weekly - 1 sets - 1 reps - 3-5 minutes hold - Clamshell with Resistance  - 1 x daily - 7 x weekly - 1-2 sets - 10 reps - 3 seconds hold - Mountain Climber on Counter  - 1 x daily - 5-7 x weekly -  2 sets - 10 reps - 5 seconds hold - standing knee extension band and single leg stance  - 1 x daily - 5-7 x weekly - 2 sets - 10 reps - 5 seconds hold  ASSESSMENT:  CLINICAL IMPRESSION: Patient's ext strength improved 10#. She still lacks full extension.  Patient appears ready to continue with ongoing HEP.    OBJECTIVE IMPAIRMENTS: Abnormal gait, decreased activity tolerance, decreased endurance, decreased knowledge of condition, difficulty walking, decreased ROM, decreased strength, increased edema, and pain.   ACTIVITY LIMITATIONS: bending, sitting, standing, squatting, sleeping, stairs, transfers, bed mobility, dressing, and locomotion level  PARTICIPATION LIMITATIONS: community activity and yard work  PERSONAL FACTORS: Lt breast cancer, kidney stones, HTN, Lt TSA 2019 are also affecting patient's functional outcome.   REHAB POTENTIAL: Good  CLINICAL DECISION MAKING: Stable/uncomplicated  EVALUATION COMPLEXITY: Low   GOALS: Goals reviewed with patient? Yes  SHORT TERM GOALS: Target date: 06/20/2024  Anyra will be independent with her day 1 home exercise program Baseline: Started 05/09/2024 Goal  status:   Met 06/06/2024  2.  Improve right knee active range of motion to 3 - 0 -95 degrees Baseline: 7 - 0 - 52 degrees Goal status: Partially Met 06/27/2024  3.  Improve right quadriceps strength to at least 30 pounds Baseline: 5.9 pounds Goal status: MET   06/30/2024   LONG TERM GOALS: Target date: 08/04/2024  Improve patient specific functional score to at least 5 Baseline: 0.67 Goal status: Met 06/27/2024  2.  Chriss will report right knee pain consistently 3/10 or better on the numeric pain rating scale Baseline: Can be as high as 8/10 Goal status: Met 06/25/2024  3.  Improve right knee AROM to 0-2-110 degrees (updated, 06/19/2024) Baseline: 0-2-109 06/19/2024 Goal status: MET   07/30/2024  4.  Improve bilateral quadriceps strength to 60 pounds or better Baseline: See objective Goal status: ongoing   07/30/2024  5.  Tenna will be comfortable without the use of an assistive device within the house and with no more than a single-point cane outside the house at transfer into independent rehabilitation Baseline: 2 wheeled walker full-time Goal status:  Met 06/13/2024  6.  We will be independent with a long-term home maintenance exercise program at discharge Baseline: Started 05/09/2024 Goal status: ongoing 07/30/2024   PLAN:  PT FREQUENCY: 2-3x/wk for 4 weeks   PT DURATION: 6 weeks   PLANNED INTERVENTIONS: 97750- Physical Performance Testing, 97110-Therapeutic exercises, 97530- Therapeutic activity, 97112- Neuromuscular re-education, 97535- Self Care, [id number]- Manual therapy, [phone]- Gait training, [phone]- Electrical stimulation (unattended), [id number]- Vasopneumatic device, Patient/Family education, Balance training, Stair training, Joint mobilization, and Cryotherapy  PLAN FOR NEXT SESSION:  check MD note & discharge.  Update & finalize HEP.     Grayce Spatz, PT, DPT 07/30/2024, 11:55 AM

## 2024-07-31 ENCOUNTER — Ambulatory Visit: Attending: Cardiology | Admitting: Cardiology

## 2024-07-31 ENCOUNTER — Ambulatory Visit: Payer: Self-pay | Admitting: Student

## 2024-07-31 ENCOUNTER — Encounter: Payer: Self-pay | Admitting: Cardiology

## 2024-07-31 VITALS — BP 126/82 | HR 64 | Ht 64.0 in | Wt 147.6 lb

## 2024-07-31 DIAGNOSIS — E785 Hyperlipidemia, unspecified: Secondary | ICD-10-CM | POA: Diagnosis present

## 2024-07-31 DIAGNOSIS — R911 Solitary pulmonary nodule: Secondary | ICD-10-CM | POA: Insufficient documentation

## 2024-07-31 DIAGNOSIS — I2510 Atherosclerotic heart disease of native coronary artery without angina pectoris: Secondary | ICD-10-CM | POA: Diagnosis present

## 2024-07-31 DIAGNOSIS — I447 Left bundle-branch block, unspecified: Secondary | ICD-10-CM | POA: Insufficient documentation

## 2024-07-31 DIAGNOSIS — I10 Essential (primary) hypertension: Secondary | ICD-10-CM | POA: Diagnosis not present

## 2024-07-31 MED ORDER — METOPROLOL SUCCINATE ER 25 MG PO TB24: 25.0000 mg | ORAL_TABLET | Freq: Every day | ORAL | 3 refills | Status: AC

## 2024-07-31 NOTE — Addendum Note (Signed)
 Addended by: CHRISTIANNE CHANNING PARAS on: 07/31/2024 11:07 AM   Modules accepted: Orders

## 2024-07-31 NOTE — Patient Instructions (Signed)
 Medication Instructions:  Continue same medications *If you need a refill on your cardiac medications before your next appointment, please call your pharmacy*  Lab Work: None ordered  Testing/Procedures: Chest CT to be scheduled 1 week before May appointment   Follow-Up: At [facility], you and your health needs are our priority.  As part of our continuing mission to provide you with exceptional heart care, our providers are all part of one team.  This team includes your primary Cardiologist (physician) and Advanced Practice Providers or APPs (Physician Assistants and Nurse Practitioners) who all work together to provide you with the care you need, when you need it.  Your next appointment:  6 months    Call in Feb to schedule May appointment     Provider:  Dr.Jordan    We recommend signing up for the patient portal called MyChart.  Sign up information is provided on this After Visit Summary.  MyChart is used to connect with patients for Virtual Visits (Telemedicine).  Patients are able to view lab/test results, encounter notes, upcoming appointments, etc.  Non-urgent messages can be sent to your provider as well.   To learn more about what you can do with MyChart, go to [email or web address].

## 2024-07-31 NOTE — Assessment & Plan Note (Signed)
 Will repeat noncontrast CT in 6 months. History of pulmonary nodules in past with stable CT in 2023.

## 2024-08-01 NOTE — Telephone Encounter (Signed)
 Patient notified

## 2024-08-04 ENCOUNTER — Encounter: Payer: Self-pay | Admitting: Orthopaedic Surgery

## 2024-08-04 ENCOUNTER — Encounter: Payer: Self-pay | Admitting: Physical Therapy

## 2024-08-04 ENCOUNTER — Ambulatory Visit (INDEPENDENT_AMBULATORY_CARE_PROVIDER_SITE_OTHER): Admitting: Physical Therapy

## 2024-08-04 ENCOUNTER — Ambulatory Visit (INDEPENDENT_AMBULATORY_CARE_PROVIDER_SITE_OTHER): Admitting: Orthopaedic Surgery

## 2024-08-04 ENCOUNTER — Other Ambulatory Visit: Payer: Self-pay

## 2024-08-04 DIAGNOSIS — M25561 Pain in right knee: Secondary | ICD-10-CM | POA: Diagnosis not present

## 2024-08-04 DIAGNOSIS — R262 Difficulty in walking, not elsewhere classified: Secondary | ICD-10-CM

## 2024-08-04 DIAGNOSIS — R600 Localized edema: Secondary | ICD-10-CM

## 2024-08-04 DIAGNOSIS — M25661 Stiffness of right knee, not elsewhere classified: Secondary | ICD-10-CM | POA: Diagnosis not present

## 2024-08-04 DIAGNOSIS — Z96651 Presence of right artificial knee joint: Secondary | ICD-10-CM

## 2024-08-04 DIAGNOSIS — M6281 Muscle weakness (generalized): Secondary | ICD-10-CM | POA: Diagnosis not present

## 2024-08-04 NOTE — Progress Notes (Signed)
 The patient is a 78 year old who is now 3 months status post her right total knee replacement.  Her and her husband usually go to [place]  over the holidays.  She is someone who we did have to manipulate her right knee secondary to arthrofibrosis.  She comes today saying that therapy has been completed and she has good range of motion and strength.  She is walking without an assistive device as well.  On exam her extension is almost full and her flexion is full of the right knee.  Her knee feels ligamentously stable on exam.  There is swelling to be expected still at 3 months but overall her knee looks great.  2 views of the right knee show well-seated right total knee arthroplasty with no complicating features.  From our standpoint the next time when he is here is not for 6 months unless she is having issues.  While the final AP and lateral right knee at that visit.  If there are issues before then should they know to reach out let us  know.

## 2024-08-04 NOTE — Therapy (Signed)
 OUTPATIENT PHYSICAL THERAPY TREATMENT NOTE & DISCHARGE SUMMARY   Patient Name: Brooke Cantrell MRN: [id number] DOB:1946-01-10, 78 y.o., female Today's Date: 08/04/2024  PHYSICAL THERAPY DISCHARGE SUMMARY  Visits from Start of Care: 33  Current functional level related to goals / functional outcomes: See below   Remaining deficits: See below   Education / Equipment: Patient was educated in ongoing HEP which she appears to understand.     Patient agrees to discharge. Patient goals were partially met. Patient is being discharged due to meeting the stated rehab goals.  END OF SESSION:  PT End of Session - 08/04/24 1059     Visit Number 33    Number of Visits 38    Date for Recertification  08/01/24    Authorization Type MEDICARE    Progress Note Due on Visit 33    PT Start Time 1057    PT Stop Time 1141    PT Time Calculation (min) 44 min    Activity Tolerance Patient tolerated treatment well;No increased pain    Behavior During Therapy [facility] for tasks assessed/performed          Past Medical History:  Diagnosis Date   Aortic atherosclerosis 05/2024   Arthritis    Breast cancer (HCC)    Left breast   Depression    mild, no meds   History of kidney stones    passed the stone   HLD (hyperlipidemia)    Hypertension    Osteopenia 05/2024   Past Surgical History:  Procedure Laterality Date   breast cancer surgery     BREAST ENHANCEMENT SURGERY     BREAST LUMPECTOMY Left    CATARACT EXTRACTION, BILATERAL     ECTOPIC PREGNANCY SURGERY     EXAM UNDER ANESTHESIA WITH MANIPULATION OF KNEE Right 06/19/2024   Procedure: MANIPULATION, JOINT, KNEE, WITH ANESTHESIA;  Surgeon: Vernetta Lonni GRADE, MD;  Location: [facility] OR;  Service: Orthopedics;  Laterality: Right;  RIGHT KNEE MANIPULATION UNDER ANESTHESIA   EYE SURGERY     INNER EAR SURGERY     TONSILLECTOMY     TOTAL KNEE ARTHROPLASTY Right 04/22/2024   Procedure: ARTHROPLASTY, KNEE, TOTAL;  Surgeon: Vernetta Lonni GRADE, MD;  Location: [facility] OR;  Service: Orthopedics;  Laterality: Right;   TOTAL SHOULDER ARTHROPLASTY Left 04/25/2018   TOTAL SHOULDER ARTHROPLASTY Left 04/25/2018   Procedure: LEFT TOTAL SHOULDER REPLACEMENT;  Surgeon: Addie Cordella Hamilton, MD;  [facility];  Service: Orthopedics;  Laterality: Left;   TUBAL LIGATION     Patient Active Problem List   Diagnosis Date Noted   Left bundle branch block 06/23/2024   Dizziness 06/23/2024   Coronary artery calcification 06/23/2024   Arthrofibrosis of knee joint, right 06/18/2024   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   Weakness    Hyperlipidemia    Acute respiratory failure due to COVID-19 (HCC) 06/10/2020   Pneumonia due to COVID-19 virus 06/10/2020   Hx of multiple pulmonary nodules 06/10/2020   Acute respiratory failure with hypoxia (HCC)    Lung nodule seen on imaging study 01/08/2020   History of breast cancer 04/14/2019   Sepsis (HCC) 04/02/2019   CAP (community acquired pneumonia) 04/02/2019   Elevated troponin 04/02/2019   Shoulder arthritis 04/25/2018   Primary osteoarthritis, 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   Chronic left shoulder pain 05/17/2017   Cervical disc disorder with radiculopathy 08/14/2016   Chronic infection of sinus 08/31/2015   Cough 08/31/2015   Allergic reaction 08/31/2015    PCP: Alda Carpen,  MD  REFERRING PROVIDER: [name], MD  REFERRING DIAG: [phone] (ICD-10-CM) - Status post total right knee replacement  THERAPY DIAG:  Acute pain of right knee  Stiffness of right knee, not elsewhere classified  Localized edema  Muscle weakness (generalized)  Difficulty in walking, not elsewhere classified  Rationale for Evaluation and Treatment: Rehabilitation  ONSET DATE: 04/22/2024  SUBJECTIVE:   SUBJECTIVE STATEMENT: Her cardiologist said she is okay for activities and return in 6 months.  She stood a lot to [employer] and she had trouble sleeping.  The  CBD ointment did not seem to help.    PERTINENT HISTORY: Lt breast cancer, kidney stones, HTN, Lt TSA 2019  PAIN:  NPRS scale:  achy, no specific number provided.  Location: Rt knee Pain description: Stiff, achy, rare sharp Aggravating factors: Prolonged postures and too much WB Relieving factors: Pain meds, ice, change positions frequently  PRECAUTIONS: None  RED FLAGS: None   WEIGHT BEARING RESTRICTIONS: No  FALLS:  Has patient fallen in last 6 months? No  LIVING ENVIRONMENT: Lives with: lives with their family and grandson Lives in: House/apartment Stairs: Uses the rail with steps Has following equipment at home: Single point cane and Environmental Consultant - 2 wheeled  OCCUPATION: Retired  PLOF: Independent  PATIENT GOALS: Return to gardening her acre lot, weed, plant in the garden  OBJECTIVE:  Note: Objective measures were completed at Evaluation unless otherwise noted.  DIAGNOSTIC FINDINGS: Right knee arthroplasty in expected alignment. No periprosthetic lucency or fracture. There has been patellar resurfacing. Recent postsurgical change includes air and edema in the soft tissues and joint space. Anterior skin staples in place.  PATIENT SURVEYS:  PSFS: THE PATIENT SPECIFIC FUNCTIONAL SCALE  Place score of 0-10 (0 = unable to perform activity and 10 = able to perform activity at the same level as before injury or problem)  Activity Date:  05/09/2024 06/02/2024 06/26/24  07/02/24 07/30/24  Walk 2 5 8 8 9   2.  Garden 0 0 4 6 7   3.  Safely transfer to and from the ground 0 0 5 5 7   4.        Total Score 0.67 1.67 5.67 6.33 7.67    Total Score = Sum of activity scores/number of activities  Minimally Detectable Change: 3 points (for single activity); 2 points (for average score)  [name] (nd). The Patient Specific Functional Scale . Retrieved from [email or web address]   COGNITION: Overall cognitive  status: Within functional limits for tasks assessed     SENSATION: WFL  EDEMA:  Noted and not objectively assessed  LOWER EXTREMITY ROM:   ROM Left/Right  05/09/2024 Rt 05/13/24 Right 05/20/24 Right 05/21/24 Right 05/26/24 Right 05/28/24 Rt 06/02/24 Right 06/04/24 Right  06/06/24 Rt 06/09/24 Right  06/13/24 Rt 06/19/24  Right 06/20/24 Right  06/25/24 Right 06/27/24 Right 07/02/24 Right 07/23/24 RIght 07/30/24  Knee flexion 145/52 Supine:  A:  P:  Sitting:  P: 70 Supine AA: 76* Seated P: 78* Standing  P: flexion stretch 82* Seated P: 87* Supine  A: 76deg A: 89  P: 95 Active 93 AROM: 87 PROM: 91 Active 97 AROM: 109deg A: 115 113 117 Seated P: 117* A: 110*  Seated A: 115* P: 119*  Knee extension 0/7     Seated P: -12*  LAQ Supine:  A: 6deg of flexion A: -11 (seated LAQ) Active lacks 5 AROM: 10deg flexion PROM: 8deg flexion  Active 5 AROM: 3deg PROM: 2deg    5  Lacks 5 LAQ -4* Standing  A: -2* Standing A: -2* Standing A: -2 & LAQ -2*   (Blank rows = not tested)  LOWER EXTREMITY STRENGTH:  In pounds with hand-held dynamometer Left/Right 05/09/2024 Right 06/04/24 Right 06/30/2024 Right 07/30/24  Knee flexion      Knee extension 49.7/5.9 pounds 27.8# 30.8, 30.5 lbs 40.8#, 42.4# 84% of LLE   (Blank rows = not tested)  GAIT: 07/07/2024: Independent ambulation reported at home and in public.   Eval: Distance walked: 50 feet Assistive device utilized: Walker - 2 wheeled Level of assistance: Modified independence Comments: Leia is using her walker full-time at this point                    TODAY'S TREATMENT: 08/04/2024 Therapeutic Exercise: SciFit Recumbent bike seat 9 forward full revolutions level 4 for 8 min.  PT updated HEP with HO for ongoing flexibility, strength, endurance and balance.  Pt performed each exercise during session and verbalized understanding.     TREATMENT: 07/30/2024 Therapeutic Exercise: precor Recumbent bike seat 4 forward full  revolutions level 4 for 8 min.  PT reviewed Knee ext stretch with RLE extended in chair with 5# hang 1 min 2 reps with knee flexion bw reps.   Standing gastroc stretch on step heel depression  & hamstring stretch foot on step 30 sec 3 reps each. Prone RLE ext stretch with LLE crossed over at ankle 1 min 2 sets.  Between sets RLE active knee flexion with isometric ext 5 sec for 10 reps  Therapeutic Activities:  Mountain climber in plank with counter top support 10 reps with focus on stance knee ext.  TKE blue theraband  RLE 10 reps 2 sets TKE + SLS  PT educated how to perform as HEP which pt & husband verbalized understanding.   Self-care PT & pt discussed improving sleep: if taking NSAID she needs some food on stomach.  She tried   CBD gummies that helped but made her feel bad next day.  PT suggested trying CBD rub on stick before bed.    See objective data   TREATMENT: 07/28/2024 Therapeutic Exercise: precor Recumbent bike seat 4 forward full revolutions level 4 for 8 min.  PT reviewed Knee ext stretch with RLE extended in chair with 5# hang 1 min 2 reps with knee flexion bw reps.   Standing on incline board in straddle position gastroc stretch 30 sec and heel raises BLEs 10 reps 2 sets  Therapeutic Activities:  Step up, over & down on BOSU round side up with 2 step approach so momentum minimizing UE assist (BUEs on //bars) 10 reps with each LE  Stepping over 6 hurdles (6) with focus on knee flex to clear and knee ext in stance.  1 rep with HHA, then 5 more reps with intermittent UE assist.   Mountain climber in plank with counter top support 10 reps with focus on stance knee ext.  TKE blue theraband  RLE 10 reps 2 sets TKE + SLS  Increasing gait velocity simulating crossing street with cues on longer steps.  Pt performed 6 reps.  PT recommended trying to increase speed for 15-20 steps 3x during her walks. Pt verbalized understanding.    Manual Therapy Right knee ext Grade IV mob  with belt standing on incline board    TREATMENT: 07/23/2024 Therapeutic Exercise: precor Recumbent bike seat 4 full revolutions backwards 10 reps,  then forward full revolutions level 3 for 8 min.  Knee ext  machine 10# BLE concentric and RLE isometric / eccentric 15 reps Prone RLE ext stretch with LLE crossed over at ankle 1 min 2 sets.  Between sets RLE active knee flexion with isometric ext 5 sec for 10 reps  Therapeutic Activities:  Stairs alternating pattern 11 steps flight descending 1st flight 2 rails, 2nd flight left rail, 3rd flight right rail;  ascending single rail with ea side. Working on single step down with RLE with PT cues on technique - 10 reps left rail, 10 reps right rail, 5 reps no rail.   Mountain climber in plank with counter top support 10 reps with focus on stance knee ext.  TKE blue theraband  RLE 10 reps 2 sets first rep TKE only then 2nd set TKE + SLS Ascending & descending curb using RLE with supervision & verbal cues on technique.      HOME EXERCISE PROGRAM: Access Code: [email or web address] URL: [email or web address] Date: 07/30/2024 Prepared by: Grayce Spatz  Exercises - Seated Knee Flexion AAROM  - 5 x daily - 7 x weekly - 1 sets - 10 reps - 10 seconds hold - Supine Quadricep Sets  - 10 x daily - 7 x weekly - 10 sets - 10 reps - 5 second hold - Seated Knee Flexion Extension AROM   - 2-4 x daily - 7 x weekly - 2-3 sets - 10 reps - 5 seconds hold - Seated Knee Flexion Extension AAROM with Overpressure  - 2-4 x daily - 7 x weekly - 2-3 sets - 10 reps - 5 seconds hold - Seated Long Arc Quad  - 2-4 x daily - 7 x weekly - 2-3 sets - 10 reps - 5 seconds hold - Supine Heel Slide with Strap  - 2-4 x daily - 7 x weekly - 2-3 sets - 10 reps - 5 seconds hold - Standing Knee Flexion Stretch on Step  - 2-3 x daily - 7 x weekly - 3-5 sets - 3 reps - 10 seconds hold - Gastroc Stretch on Step  - 1-3 x daily - 7 x weekly - 1 sets - 3 reps - 30 seconds hold -  Standing Heel Raise  - 1-2 x daily - 7 x weekly - 2-3 sets - 10 reps - 2-3 seconds hold - Tandem Stance  - 1 x daily - 7 x weekly - 3 sets - 2 reps - 30 seconds hold - Seated Table Hamstring Stretch  - 1-3 x daily - 7 x weekly - 1 sets - 3 reps - 30 seconds hold - Seated Hamstring Stretch with Strap  - 1-2 x daily - 7 x weekly - 1 sets - 3 reps - 30 seconds hold - Seated Passive Knee Extension  - 2 x daily - 7 x weekly - 3-5 minutes hold - Supine Quadriceps Stretch with Strap on Table  - 1-3 x daily - 7 x weekly - 1 sets - 3 reps - 30 seconds hold - Prone Knee Extension with Ankle Weight  - 1 x daily - 7 x weekly - 1 sets - 1 reps - 3-5 minutes hold - Clamshell with Resistance  - 1 x daily - 7 x weekly - 1-2 sets - 10 reps - 3 seconds hold - Mountain Climber on Counter  - 1 x daily - 5-7 x weekly - 2 sets - 10 reps - 5 seconds hold - standing knee extension band and single leg stance  - 1 x daily - 5-7 x weekly - 2 sets -  10 reps - 5 seconds hold  ASSESSMENT:  CLINICAL IMPRESSION: Patient met all LTGs except strength to 60# with hand held dynameter. However her RLE is 84% of LLE quad strength.  She appears to have full functional strength. She appears to understand updated HEP.    OBJECTIVE IMPAIRMENTS: Abnormal gait, decreased activity tolerance, decreased endurance, decreased knowledge of condition, difficulty walking, decreased ROM, decreased strength, increased edema, and pain.   ACTIVITY LIMITATIONS: bending, sitting, standing, squatting, sleeping, stairs, transfers, bed mobility, dressing, and locomotion level  PARTICIPATION LIMITATIONS: community activity and yard work  PERSONAL FACTORS: Lt breast cancer, kidney stones, HTN, Lt TSA 2019 are also affecting patient's functional outcome.   REHAB POTENTIAL: Good  CLINICAL DECISION MAKING: Stable/uncomplicated  EVALUATION COMPLEXITY: Low   GOALS: Goals reviewed with patient? Yes  SHORT TERM GOALS: Target date: 06/20/2024  Abir  will be independent with her day 1 home exercise program Baseline: Started 05/09/2024 Goal status:   Met 06/06/2024  2.  Improve right knee active range of motion to 3 - 0 -95 degrees Baseline: 7 - 0 - 52 degrees Goal status: Partially Met 06/27/2024  3.  Improve right quadriceps strength to at least 30 pounds Baseline: 5.9 pounds Goal status: MET   06/30/2024   LONG TERM GOALS: Target date: 08/04/2024  Improve patient specific functional score to at least 5 Baseline: 0.67 Goal status: Met 06/27/2024  2.  Dorothie will report right knee pain consistently 3/10 or better on the numeric pain rating scale Baseline: Can be as high as 8/10 Goal status: Met 06/25/2024  3.  Improve right knee AROM to 0-2-110 degrees (updated, 06/19/2024) Baseline: 0-2-109 06/19/2024 Goal status: MET   07/30/2024  4.  Improve bilateral quadriceps strength to 60 pounds or better Baseline: See objective Goal status: progressed but not fully met    08/04/2024  5.  Chanele will be comfortable without the use of an assistive device within the house and with no more than a single-point cane outside the house at transfer into independent rehabilitation Baseline: 2 wheeled walker full-time Goal status:  Met 06/13/2024  6.  We will be independent with a long-term home maintenance exercise program at discharge Baseline: Started 05/09/2024 Goal status: MET  08/04/2024   PLAN:  PT FREQUENCY: 2-3x/wk for 4 weeks   PT DURATION: 6 weeks   PLANNED INTERVENTIONS: 97750- Physical Performance Testing, 97110-Therapeutic exercises, 97530- Therapeutic activity, 97112- Neuromuscular re-education, 97535- Self Care, [id number]- Manual therapy, [phone]- Gait training, [phone]- Electrical stimulation (unattended), [id number]- Vasopneumatic device, Patient/Family education, Balance training, Stair training, Joint mobilization, and Cryotherapy  PLAN FOR NEXT SESSION:  discharge PT.     Nazia Rhines, PT, DPT 08/04/2024, 1:00 PM

## 2024-08-06 ENCOUNTER — Encounter: Admitting: Physical Therapy
